# Patient Record
Sex: Female | Born: 1970
Health system: Southern US, Community
[De-identification: ages and names within clinical notes are randomized; demographics above are authoritative.]

## PROBLEM LIST (undated history)

## (undated) DIAGNOSIS — I10 Essential (primary) hypertension: Secondary | ICD-10-CM

## (undated) DIAGNOSIS — K219 Gastro-esophageal reflux disease without esophagitis: Secondary | ICD-10-CM

## (undated) DIAGNOSIS — N186 End stage renal disease: Secondary | ICD-10-CM

## (undated) DIAGNOSIS — R51 Headache: Secondary | ICD-10-CM

## (undated) DIAGNOSIS — F32A Depression, unspecified: Secondary | ICD-10-CM

## (undated) DIAGNOSIS — E7800 Pure hypercholesterolemia, unspecified: Secondary | ICD-10-CM

## (undated) DIAGNOSIS — I2510 Atherosclerotic heart disease of native coronary artery without angina pectoris: Secondary | ICD-10-CM

## (undated) DIAGNOSIS — I219 Acute myocardial infarction, unspecified: Secondary | ICD-10-CM

## (undated) DIAGNOSIS — K7581 Nonalcoholic steatohepatitis (NASH): Secondary | ICD-10-CM

## (undated) DIAGNOSIS — G35 Multiple sclerosis: Secondary | ICD-10-CM

## (undated) DIAGNOSIS — F329 Major depressive disorder, single episode, unspecified: Secondary | ICD-10-CM

## (undated) DIAGNOSIS — I739 Peripheral vascular disease, unspecified: Secondary | ICD-10-CM

## (undated) DIAGNOSIS — K429 Umbilical hernia without obstruction or gangrene: Secondary | ICD-10-CM

## (undated) DIAGNOSIS — Z992 Dependence on renal dialysis: Secondary | ICD-10-CM

## (undated) DIAGNOSIS — Z9109 Other allergy status, other than to drugs and biological substances: Secondary | ICD-10-CM

## (undated) DIAGNOSIS — E039 Hypothyroidism, unspecified: Secondary | ICD-10-CM

## (undated) DIAGNOSIS — C73 Malignant neoplasm of thyroid gland: Secondary | ICD-10-CM

## (undated) DIAGNOSIS — E109 Type 1 diabetes mellitus without complications: Secondary | ICD-10-CM

## (undated) DIAGNOSIS — Z9889 Other specified postprocedural states: Secondary | ICD-10-CM

## (undated) DIAGNOSIS — R112 Nausea with vomiting, unspecified: Secondary | ICD-10-CM

## (undated) HISTORY — PX: THYROIDECTOMY: SHX17

## (undated) HISTORY — PX: IRRIGATION AND DEBRIDEMENT ABSCESS: SHX5252

## (undated) HISTORY — PX: CARDIAC CATHETERIZATION: SHX172

## (undated) HISTORY — PX: CORONARY ANGIOPLASTY: SHX604

## (undated) HISTORY — PX: PARATHYROIDECTOMY: SHX19

## (undated) HISTORY — PX: CORONARY ANGIOPLASTY WITH STENT PLACEMENT: SHX49

## (undated) HISTORY — PX: LEG AMPUTATION BELOW KNEE: SHX694

---
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
## 1996-10-17 DIAGNOSIS — I219 Acute myocardial infarction, unspecified: Secondary | ICD-10-CM

## 1996-10-17 HISTORY — DX: Acute myocardial infarction, unspecified: I21.9

## 2004-10-17 HISTORY — PX: ABDOMINAL SURGERY: SHX537

## 2011-11-29 DIAGNOSIS — J449 Chronic obstructive pulmonary disease, unspecified: Secondary | ICD-10-CM | POA: Diagnosis not present

## 2011-11-29 DIAGNOSIS — I10 Essential (primary) hypertension: Secondary | ICD-10-CM | POA: Diagnosis not present

## 2011-12-26 DIAGNOSIS — I10 Essential (primary) hypertension: Secondary | ICD-10-CM | POA: Diagnosis not present

## 2011-12-26 DIAGNOSIS — E7800 Pure hypercholesterolemia, unspecified: Secondary | ICD-10-CM | POA: Diagnosis not present

## 2011-12-26 DIAGNOSIS — J209 Acute bronchitis, unspecified: Secondary | ICD-10-CM | POA: Diagnosis not present

## 2012-01-13 DIAGNOSIS — E119 Type 2 diabetes mellitus without complications: Secondary | ICD-10-CM | POA: Diagnosis not present

## 2012-01-13 DIAGNOSIS — I10 Essential (primary) hypertension: Secondary | ICD-10-CM | POA: Diagnosis not present

## 2012-01-13 DIAGNOSIS — E7800 Pure hypercholesterolemia, unspecified: Secondary | ICD-10-CM | POA: Diagnosis not present

## 2012-02-15 DIAGNOSIS — E119 Type 2 diabetes mellitus without complications: Secondary | ICD-10-CM | POA: Diagnosis not present

## 2012-02-15 DIAGNOSIS — M549 Dorsalgia, unspecified: Secondary | ICD-10-CM | POA: Diagnosis not present

## 2012-02-15 DIAGNOSIS — I10 Essential (primary) hypertension: Secondary | ICD-10-CM | POA: Diagnosis not present

## 2012-03-30 DIAGNOSIS — I739 Peripheral vascular disease, unspecified: Secondary | ICD-10-CM | POA: Diagnosis not present

## 2012-03-30 DIAGNOSIS — G8929 Other chronic pain: Secondary | ICD-10-CM | POA: Diagnosis not present

## 2012-03-30 DIAGNOSIS — I10 Essential (primary) hypertension: Secondary | ICD-10-CM | POA: Diagnosis not present

## 2012-03-30 DIAGNOSIS — F329 Major depressive disorder, single episode, unspecified: Secondary | ICD-10-CM | POA: Diagnosis not present

## 2012-03-30 DIAGNOSIS — H60399 Other infective otitis externa, unspecified ear: Secondary | ICD-10-CM | POA: Diagnosis not present

## 2012-03-30 DIAGNOSIS — E785 Hyperlipidemia, unspecified: Secondary | ICD-10-CM | POA: Diagnosis not present

## 2012-04-20 DIAGNOSIS — I10 Essential (primary) hypertension: Secondary | ICD-10-CM | POA: Diagnosis not present

## 2012-04-20 DIAGNOSIS — L89309 Pressure ulcer of unspecified buttock, unspecified stage: Secondary | ICD-10-CM | POA: Diagnosis not present

## 2012-04-20 DIAGNOSIS — E785 Hyperlipidemia, unspecified: Secondary | ICD-10-CM | POA: Diagnosis not present

## 2012-07-18 DIAGNOSIS — E119 Type 2 diabetes mellitus without complications: Secondary | ICD-10-CM | POA: Diagnosis not present

## 2012-07-18 DIAGNOSIS — J019 Acute sinusitis, unspecified: Secondary | ICD-10-CM | POA: Diagnosis not present

## 2012-07-18 DIAGNOSIS — G8929 Other chronic pain: Secondary | ICD-10-CM | POA: Diagnosis not present

## 2012-07-18 DIAGNOSIS — I10 Essential (primary) hypertension: Secondary | ICD-10-CM | POA: Diagnosis not present

## 2012-07-18 DIAGNOSIS — J209 Acute bronchitis, unspecified: Secondary | ICD-10-CM | POA: Diagnosis not present

## 2012-07-18 DIAGNOSIS — M79609 Pain in unspecified limb: Secondary | ICD-10-CM | POA: Diagnosis not present

## 2012-08-28 DIAGNOSIS — R51 Headache: Secondary | ICD-10-CM | POA: Diagnosis not present

## 2012-08-28 DIAGNOSIS — E119 Type 2 diabetes mellitus without complications: Secondary | ICD-10-CM | POA: Diagnosis not present

## 2012-08-28 DIAGNOSIS — S88119A Complete traumatic amputation at level between knee and ankle, unspecified lower leg, initial encounter: Secondary | ICD-10-CM | POA: Diagnosis not present

## 2012-08-28 DIAGNOSIS — Z9119 Patient's noncompliance with other medical treatment and regimen: Secondary | ICD-10-CM | POA: Diagnosis not present

## 2012-08-28 DIAGNOSIS — I10 Essential (primary) hypertension: Secondary | ICD-10-CM | POA: Diagnosis not present

## 2012-08-28 DIAGNOSIS — I731 Thromboangiitis obliterans [Buerger's disease]: Secondary | ICD-10-CM | POA: Diagnosis not present

## 2012-08-28 DIAGNOSIS — Z7982 Long term (current) use of aspirin: Secondary | ICD-10-CM | POA: Diagnosis not present

## 2012-08-28 DIAGNOSIS — I252 Old myocardial infarction: Secondary | ICD-10-CM | POA: Diagnosis not present

## 2012-08-28 DIAGNOSIS — Z9861 Coronary angioplasty status: Secondary | ICD-10-CM | POA: Diagnosis not present

## 2012-09-22 DIAGNOSIS — Z888 Allergy status to other drugs, medicaments and biological substances status: Secondary | ICD-10-CM | POA: Diagnosis not present

## 2012-09-22 DIAGNOSIS — I10 Essential (primary) hypertension: Secondary | ICD-10-CM | POA: Diagnosis not present

## 2012-10-05 DIAGNOSIS — F329 Major depressive disorder, single episode, unspecified: Secondary | ICD-10-CM | POA: Diagnosis not present

## 2012-10-05 DIAGNOSIS — Z9861 Coronary angioplasty status: Secondary | ICD-10-CM | POA: Diagnosis not present

## 2012-10-05 DIAGNOSIS — I2510 Atherosclerotic heart disease of native coronary artery without angina pectoris: Secondary | ICD-10-CM | POA: Diagnosis not present

## 2012-10-05 DIAGNOSIS — Z8679 Personal history of other diseases of the circulatory system: Secondary | ICD-10-CM | POA: Diagnosis not present

## 2012-10-05 DIAGNOSIS — R002 Palpitations: Secondary | ICD-10-CM | POA: Diagnosis not present

## 2012-10-05 DIAGNOSIS — I517 Cardiomegaly: Secondary | ICD-10-CM | POA: Diagnosis not present

## 2012-10-05 DIAGNOSIS — E119 Type 2 diabetes mellitus without complications: Secondary | ICD-10-CM | POA: Diagnosis not present

## 2012-10-05 DIAGNOSIS — I731 Thromboangiitis obliterans [Buerger's disease]: Secondary | ICD-10-CM | POA: Diagnosis not present

## 2012-10-05 DIAGNOSIS — J45909 Unspecified asthma, uncomplicated: Secondary | ICD-10-CM | POA: Diagnosis not present

## 2012-10-05 DIAGNOSIS — E785 Hyperlipidemia, unspecified: Secondary | ICD-10-CM | POA: Diagnosis not present

## 2012-10-05 DIAGNOSIS — N179 Acute kidney failure, unspecified: Secondary | ICD-10-CM | POA: Diagnosis not present

## 2012-10-05 DIAGNOSIS — S88119A Complete traumatic amputation at level between knee and ankle, unspecified lower leg, initial encounter: Secondary | ICD-10-CM | POA: Diagnosis not present

## 2012-10-05 DIAGNOSIS — N182 Chronic kidney disease, stage 2 (mild): Secondary | ICD-10-CM | POA: Diagnosis not present

## 2012-10-05 DIAGNOSIS — R079 Chest pain, unspecified: Secondary | ICD-10-CM | POA: Diagnosis not present

## 2012-10-05 DIAGNOSIS — I129 Hypertensive chronic kidney disease with stage 1 through stage 4 chronic kidney disease, or unspecified chronic kidney disease: Secondary | ICD-10-CM | POA: Diagnosis not present

## 2012-10-11 DIAGNOSIS — Z029 Encounter for administrative examinations, unspecified: Secondary | ICD-10-CM | POA: Diagnosis not present

## 2012-10-13 DIAGNOSIS — I739 Peripheral vascular disease, unspecified: Secondary | ICD-10-CM | POA: Diagnosis not present

## 2012-10-13 DIAGNOSIS — G459 Transient cerebral ischemic attack, unspecified: Secondary | ICD-10-CM | POA: Diagnosis not present

## 2012-10-13 DIAGNOSIS — F329 Major depressive disorder, single episode, unspecified: Secondary | ICD-10-CM | POA: Diagnosis present

## 2012-10-13 DIAGNOSIS — S88119A Complete traumatic amputation at level between knee and ankle, unspecified lower leg, initial encounter: Secondary | ICD-10-CM | POA: Diagnosis not present

## 2012-10-13 DIAGNOSIS — I674 Hypertensive encephalopathy: Secondary | ICD-10-CM | POA: Diagnosis present

## 2012-10-13 DIAGNOSIS — R51 Headache: Secondary | ICD-10-CM | POA: Diagnosis not present

## 2012-10-13 DIAGNOSIS — N179 Acute kidney failure, unspecified: Secondary | ICD-10-CM | POA: Diagnosis not present

## 2012-10-13 DIAGNOSIS — R069 Unspecified abnormalities of breathing: Secondary | ICD-10-CM | POA: Diagnosis not present

## 2012-10-13 DIAGNOSIS — I129 Hypertensive chronic kidney disease with stage 1 through stage 4 chronic kidney disease, or unspecified chronic kidney disease: Secondary | ICD-10-CM | POA: Diagnosis not present

## 2012-10-13 DIAGNOSIS — Z9861 Coronary angioplasty status: Secondary | ICD-10-CM | POA: Diagnosis not present

## 2012-10-13 DIAGNOSIS — E119 Type 2 diabetes mellitus without complications: Secondary | ICD-10-CM | POA: Diagnosis present

## 2012-10-13 DIAGNOSIS — I319 Disease of pericardium, unspecified: Secondary | ICD-10-CM | POA: Diagnosis not present

## 2012-10-13 DIAGNOSIS — I10 Essential (primary) hypertension: Secondary | ICD-10-CM | POA: Diagnosis not present

## 2012-10-13 DIAGNOSIS — N184 Chronic kidney disease, stage 4 (severe): Secondary | ICD-10-CM | POA: Diagnosis present

## 2012-10-13 DIAGNOSIS — H538 Other visual disturbances: Secondary | ICD-10-CM | POA: Diagnosis not present

## 2012-10-13 DIAGNOSIS — E7800 Pure hypercholesterolemia, unspecified: Secondary | ICD-10-CM | POA: Diagnosis not present

## 2012-10-13 DIAGNOSIS — I517 Cardiomegaly: Secondary | ICD-10-CM | POA: Diagnosis not present

## 2012-10-13 DIAGNOSIS — R011 Cardiac murmur, unspecified: Secondary | ICD-10-CM | POA: Diagnosis not present

## 2012-10-13 DIAGNOSIS — I2510 Atherosclerotic heart disease of native coronary artery without angina pectoris: Secondary | ICD-10-CM | POA: Diagnosis not present

## 2012-10-13 DIAGNOSIS — I252 Old myocardial infarction: Secondary | ICD-10-CM | POA: Diagnosis not present

## 2012-10-13 DIAGNOSIS — Z888 Allergy status to other drugs, medicaments and biological substances status: Secondary | ICD-10-CM | POA: Diagnosis not present

## 2012-10-13 DIAGNOSIS — Z9119 Patient's noncompliance with other medical treatment and regimen: Secondary | ICD-10-CM | POA: Diagnosis not present

## 2012-10-13 DIAGNOSIS — N189 Chronic kidney disease, unspecified: Secondary | ICD-10-CM | POA: Diagnosis not present

## 2012-10-30 DIAGNOSIS — R079 Chest pain, unspecified: Secondary | ICD-10-CM | POA: Diagnosis not present

## 2012-11-23 DIAGNOSIS — E7800 Pure hypercholesterolemia, unspecified: Secondary | ICD-10-CM | POA: Diagnosis not present

## 2012-11-23 DIAGNOSIS — I10 Essential (primary) hypertension: Secondary | ICD-10-CM | POA: Diagnosis not present

## 2013-01-28 DIAGNOSIS — S7000XA Contusion of unspecified hip, initial encounter: Secondary | ICD-10-CM | POA: Diagnosis not present

## 2013-01-28 DIAGNOSIS — M25559 Pain in unspecified hip: Secondary | ICD-10-CM | POA: Diagnosis not present

## 2013-02-25 DIAGNOSIS — S88119A Complete traumatic amputation at level between knee and ankle, unspecified lower leg, initial encounter: Secondary | ICD-10-CM | POA: Diagnosis not present

## 2013-02-25 DIAGNOSIS — H5319 Other subjective visual disturbances: Secondary | ICD-10-CM | POA: Diagnosis not present

## 2013-02-25 DIAGNOSIS — E119 Type 2 diabetes mellitus without complications: Secondary | ICD-10-CM | POA: Diagnosis not present

## 2013-02-25 DIAGNOSIS — I252 Old myocardial infarction: Secondary | ICD-10-CM | POA: Diagnosis not present

## 2013-02-25 DIAGNOSIS — Z79899 Other long term (current) drug therapy: Secondary | ICD-10-CM | POA: Diagnosis not present

## 2013-02-25 DIAGNOSIS — Z9861 Coronary angioplasty status: Secondary | ICD-10-CM | POA: Diagnosis not present

## 2013-02-25 DIAGNOSIS — I10 Essential (primary) hypertension: Secondary | ICD-10-CM | POA: Diagnosis not present

## 2013-02-25 DIAGNOSIS — I739 Peripheral vascular disease, unspecified: Secondary | ICD-10-CM | POA: Diagnosis not present

## 2013-02-25 DIAGNOSIS — I129 Hypertensive chronic kidney disease with stage 1 through stage 4 chronic kidney disease, or unspecified chronic kidney disease: Secondary | ICD-10-CM | POA: Diagnosis not present

## 2013-02-25 DIAGNOSIS — E875 Hyperkalemia: Secondary | ICD-10-CM | POA: Diagnosis not present

## 2013-02-25 DIAGNOSIS — E876 Hypokalemia: Secondary | ICD-10-CM | POA: Diagnosis not present

## 2013-02-25 DIAGNOSIS — N189 Chronic kidney disease, unspecified: Secondary | ICD-10-CM | POA: Diagnosis not present

## 2013-02-25 DIAGNOSIS — Z794 Long term (current) use of insulin: Secondary | ICD-10-CM | POA: Diagnosis not present

## 2013-03-06 DIAGNOSIS — E119 Type 2 diabetes mellitus without complications: Secondary | ICD-10-CM | POA: Diagnosis not present

## 2013-03-06 DIAGNOSIS — E7800 Pure hypercholesterolemia, unspecified: Secondary | ICD-10-CM | POA: Diagnosis not present

## 2013-03-06 DIAGNOSIS — E039 Hypothyroidism, unspecified: Secondary | ICD-10-CM | POA: Diagnosis not present

## 2013-03-27 DIAGNOSIS — N189 Chronic kidney disease, unspecified: Secondary | ICD-10-CM | POA: Diagnosis not present

## 2013-03-27 DIAGNOSIS — E119 Type 2 diabetes mellitus without complications: Secondary | ICD-10-CM | POA: Diagnosis not present

## 2013-03-27 DIAGNOSIS — E7800 Pure hypercholesterolemia, unspecified: Secondary | ICD-10-CM | POA: Diagnosis not present

## 2013-03-27 DIAGNOSIS — I129 Hypertensive chronic kidney disease with stage 1 through stage 4 chronic kidney disease, or unspecified chronic kidney disease: Secondary | ICD-10-CM | POA: Diagnosis not present

## 2013-04-11 DIAGNOSIS — H2510 Age-related nuclear cataract, unspecified eye: Secondary | ICD-10-CM | POA: Diagnosis not present

## 2013-04-11 DIAGNOSIS — E119 Type 2 diabetes mellitus without complications: Secondary | ICD-10-CM | POA: Diagnosis not present

## 2013-04-11 DIAGNOSIS — H04129 Dry eye syndrome of unspecified lacrimal gland: Secondary | ICD-10-CM | POA: Diagnosis not present

## 2013-05-01 DIAGNOSIS — I10 Essential (primary) hypertension: Secondary | ICD-10-CM | POA: Diagnosis not present

## 2013-05-01 DIAGNOSIS — J410 Simple chronic bronchitis: Secondary | ICD-10-CM | POA: Diagnosis not present

## 2013-05-01 DIAGNOSIS — E119 Type 2 diabetes mellitus without complications: Secondary | ICD-10-CM | POA: Diagnosis not present

## 2013-06-03 DIAGNOSIS — Z79899 Other long term (current) drug therapy: Secondary | ICD-10-CM | POA: Diagnosis not present

## 2013-06-03 DIAGNOSIS — I739 Peripheral vascular disease, unspecified: Secondary | ICD-10-CM | POA: Diagnosis not present

## 2013-06-03 DIAGNOSIS — S7000XA Contusion of unspecified hip, initial encounter: Secondary | ICD-10-CM | POA: Diagnosis not present

## 2013-06-03 DIAGNOSIS — S79929A Unspecified injury of unspecified thigh, initial encounter: Secondary | ICD-10-CM | POA: Diagnosis not present

## 2013-06-03 DIAGNOSIS — M25559 Pain in unspecified hip: Secondary | ICD-10-CM | POA: Diagnosis not present

## 2013-06-03 DIAGNOSIS — I252 Old myocardial infarction: Secondary | ICD-10-CM | POA: Diagnosis not present

## 2013-06-03 DIAGNOSIS — N189 Chronic kidney disease, unspecified: Secondary | ICD-10-CM | POA: Diagnosis not present

## 2013-06-03 DIAGNOSIS — E119 Type 2 diabetes mellitus without complications: Secondary | ICD-10-CM | POA: Diagnosis not present

## 2013-06-03 DIAGNOSIS — Z7982 Long term (current) use of aspirin: Secondary | ICD-10-CM | POA: Diagnosis not present

## 2013-06-03 DIAGNOSIS — S79919A Unspecified injury of unspecified hip, initial encounter: Secondary | ICD-10-CM | POA: Diagnosis not present

## 2013-06-03 DIAGNOSIS — S88119A Complete traumatic amputation at level between knee and ankle, unspecified lower leg, initial encounter: Secondary | ICD-10-CM | POA: Diagnosis not present

## 2013-06-03 DIAGNOSIS — N19 Unspecified kidney failure: Secondary | ICD-10-CM | POA: Diagnosis not present

## 2013-06-03 DIAGNOSIS — F17200 Nicotine dependence, unspecified, uncomplicated: Secondary | ICD-10-CM | POA: Diagnosis not present

## 2013-06-03 DIAGNOSIS — I129 Hypertensive chronic kidney disease with stage 1 through stage 4 chronic kidney disease, or unspecified chronic kidney disease: Secondary | ICD-10-CM | POA: Diagnosis not present

## 2013-06-04 DIAGNOSIS — R42 Dizziness and giddiness: Secondary | ICD-10-CM | POA: Diagnosis not present

## 2013-06-04 DIAGNOSIS — S04019A Injury of optic nerve, unspecified eye, initial encounter: Secondary | ICD-10-CM | POA: Diagnosis not present

## 2013-06-04 DIAGNOSIS — R259 Unspecified abnormal involuntary movements: Secondary | ICD-10-CM | POA: Diagnosis not present

## 2013-06-04 DIAGNOSIS — R51 Headache: Secondary | ICD-10-CM | POA: Diagnosis not present

## 2013-06-05 ENCOUNTER — Other Ambulatory Visit: Payer: Self-pay | Admitting: Neurology

## 2013-06-05 DIAGNOSIS — R51 Headache: Secondary | ICD-10-CM

## 2013-06-05 DIAGNOSIS — S04019A Injury of optic nerve, unspecified eye, initial encounter: Secondary | ICD-10-CM

## 2013-06-05 DIAGNOSIS — E119 Type 2 diabetes mellitus without complications: Secondary | ICD-10-CM | POA: Diagnosis not present

## 2013-06-05 DIAGNOSIS — M25559 Pain in unspecified hip: Secondary | ICD-10-CM | POA: Diagnosis not present

## 2013-06-05 DIAGNOSIS — I10 Essential (primary) hypertension: Secondary | ICD-10-CM | POA: Diagnosis not present

## 2013-06-10 ENCOUNTER — Ambulatory Visit (HOSPITAL_COMMUNITY)
Admission: RE | Admit: 2013-06-10 | Discharge: 2013-06-10 | Disposition: A | Discharge status: Home / Self Care | Payer: Medicare Other | Source: Ambulatory Visit | Attending: Neurology | Admitting: Neurology

## 2013-06-10 DIAGNOSIS — R42 Dizziness and giddiness: Secondary | ICD-10-CM | POA: Diagnosis not present

## 2013-06-10 DIAGNOSIS — G9389 Other specified disorders of brain: Secondary | ICD-10-CM | POA: Insufficient documentation

## 2013-06-10 DIAGNOSIS — M542 Cervicalgia: Secondary | ICD-10-CM | POA: Diagnosis not present

## 2013-06-10 DIAGNOSIS — S04039A Injury of optic tract and pathways, unspecified eye, initial encounter: Secondary | ICD-10-CM | POA: Diagnosis not present

## 2013-06-10 DIAGNOSIS — M810 Age-related osteoporosis without current pathological fracture: Secondary | ICD-10-CM | POA: Diagnosis not present

## 2013-06-10 DIAGNOSIS — Z79899 Other long term (current) drug therapy: Secondary | ICD-10-CM | POA: Diagnosis not present

## 2013-06-10 DIAGNOSIS — R51 Headache: Secondary | ICD-10-CM

## 2013-06-10 DIAGNOSIS — I10 Essential (primary) hypertension: Secondary | ICD-10-CM | POA: Diagnosis not present

## 2013-06-10 DIAGNOSIS — S04019A Injury of optic nerve, unspecified eye, initial encounter: Secondary | ICD-10-CM

## 2013-06-10 DIAGNOSIS — T07XXXA Unspecified multiple injuries, initial encounter: Secondary | ICD-10-CM | POA: Diagnosis not present

## 2013-06-10 DIAGNOSIS — M47812 Spondylosis without myelopathy or radiculopathy, cervical region: Secondary | ICD-10-CM | POA: Diagnosis not present

## 2013-06-10 DIAGNOSIS — R930 Abnormal findings on diagnostic imaging of skull and head, not elsewhere classified: Secondary | ICD-10-CM | POA: Diagnosis not present

## 2013-06-10 DIAGNOSIS — X58XXXA Exposure to other specified factors, initial encounter: Secondary | ICD-10-CM | POA: Insufficient documentation

## 2013-06-10 DIAGNOSIS — E109 Type 1 diabetes mellitus without complications: Secondary | ICD-10-CM | POA: Diagnosis not present

## 2013-06-10 DIAGNOSIS — R259 Unspecified abnormal involuntary movements: Secondary | ICD-10-CM | POA: Diagnosis not present

## 2013-06-10 DIAGNOSIS — R5381 Other malaise: Secondary | ICD-10-CM | POA: Diagnosis not present

## 2013-06-10 DIAGNOSIS — G35 Multiple sclerosis: Secondary | ICD-10-CM | POA: Diagnosis not present

## 2013-06-10 DIAGNOSIS — E538 Deficiency of other specified B group vitamins: Secondary | ICD-10-CM | POA: Diagnosis not present

## 2013-06-20 DIAGNOSIS — S32509A Unspecified fracture of unspecified pubis, initial encounter for closed fracture: Secondary | ICD-10-CM | POA: Diagnosis not present

## 2013-07-09 DIAGNOSIS — N184 Chronic kidney disease, stage 4 (severe): Secondary | ICD-10-CM | POA: Diagnosis not present

## 2013-07-09 DIAGNOSIS — F17200 Nicotine dependence, unspecified, uncomplicated: Secondary | ICD-10-CM | POA: Diagnosis not present

## 2013-07-09 DIAGNOSIS — E1129 Type 2 diabetes mellitus with other diabetic kidney complication: Secondary | ICD-10-CM | POA: Diagnosis not present

## 2013-07-09 DIAGNOSIS — I739 Peripheral vascular disease, unspecified: Secondary | ICD-10-CM | POA: Diagnosis not present

## 2013-07-09 DIAGNOSIS — I10 Essential (primary) hypertension: Secondary | ICD-10-CM | POA: Diagnosis not present

## 2013-07-11 DIAGNOSIS — R569 Unspecified convulsions: Secondary | ICD-10-CM | POA: Diagnosis not present

## 2013-07-15 ENCOUNTER — Other Ambulatory Visit: Payer: Self-pay | Admitting: Neurology

## 2013-07-15 DIAGNOSIS — G35 Multiple sclerosis: Secondary | ICD-10-CM

## 2013-07-15 DIAGNOSIS — R269 Unspecified abnormalities of gait and mobility: Secondary | ICD-10-CM | POA: Diagnosis not present

## 2013-07-15 DIAGNOSIS — R259 Unspecified abnormal involuntary movements: Secondary | ICD-10-CM | POA: Diagnosis not present

## 2013-07-15 DIAGNOSIS — H547 Unspecified visual loss: Secondary | ICD-10-CM | POA: Diagnosis not present

## 2013-07-15 DIAGNOSIS — G894 Chronic pain syndrome: Secondary | ICD-10-CM | POA: Diagnosis not present

## 2013-07-18 ENCOUNTER — Encounter (HOSPITAL_COMMUNITY): Payer: Medicare Other

## 2013-07-18 ENCOUNTER — Other Ambulatory Visit (HOSPITAL_COMMUNITY): Payer: Medicare Other

## 2013-07-25 ENCOUNTER — Inpatient Hospital Stay (HOSPITAL_COMMUNITY): Admission: RE | Admit: 2013-07-25 | Payer: Medicare Other | Source: Ambulatory Visit

## 2013-07-29 ENCOUNTER — Emergency Department (HOSPITAL_COMMUNITY): Payer: Medicare Other

## 2013-07-29 ENCOUNTER — Inpatient Hospital Stay (HOSPITAL_COMMUNITY)
Admission: EM | Admit: 2013-07-29 | Discharge: 2013-08-03 | DRG: 442 | Disposition: A | Discharge status: Home / Self Care | Payer: Medicare Other | Attending: Internal Medicine | Admitting: Internal Medicine

## 2013-07-29 ENCOUNTER — Encounter (HOSPITAL_COMMUNITY): Payer: Self-pay | Admitting: Emergency Medicine

## 2013-07-29 DIAGNOSIS — E119 Type 2 diabetes mellitus without complications: Secondary | ICD-10-CM

## 2013-07-29 DIAGNOSIS — K766 Portal hypertension: Secondary | ICD-10-CM | POA: Diagnosis present

## 2013-07-29 DIAGNOSIS — N289 Disorder of kidney and ureter, unspecified: Secondary | ICD-10-CM | POA: Diagnosis present

## 2013-07-29 DIAGNOSIS — I739 Peripheral vascular disease, unspecified: Secondary | ICD-10-CM | POA: Diagnosis present

## 2013-07-29 DIAGNOSIS — E872 Acidosis, unspecified: Secondary | ICD-10-CM | POA: Diagnosis not present

## 2013-07-29 DIAGNOSIS — E1129 Type 2 diabetes mellitus with other diabetic kidney complication: Secondary | ICD-10-CM | POA: Diagnosis present

## 2013-07-29 DIAGNOSIS — R971 Elevated cancer antigen 125 [CA 125]: Secondary | ICD-10-CM | POA: Diagnosis not present

## 2013-07-29 DIAGNOSIS — K8020 Calculus of gallbladder without cholecystitis without obstruction: Secondary | ICD-10-CM | POA: Diagnosis not present

## 2013-07-29 DIAGNOSIS — R109 Unspecified abdominal pain: Secondary | ICD-10-CM

## 2013-07-29 DIAGNOSIS — Z9861 Coronary angioplasty status: Secondary | ICD-10-CM

## 2013-07-29 DIAGNOSIS — K7460 Unspecified cirrhosis of liver: Secondary | ICD-10-CM | POA: Diagnosis not present

## 2013-07-29 DIAGNOSIS — N189 Chronic kidney disease, unspecified: Secondary | ICD-10-CM | POA: Diagnosis present

## 2013-07-29 DIAGNOSIS — I252 Old myocardial infarction: Secondary | ICD-10-CM

## 2013-07-29 DIAGNOSIS — I2510 Atherosclerotic heart disease of native coronary artery without angina pectoris: Secondary | ICD-10-CM | POA: Diagnosis present

## 2013-07-29 DIAGNOSIS — R161 Splenomegaly, not elsewhere classified: Secondary | ICD-10-CM | POA: Diagnosis present

## 2013-07-29 DIAGNOSIS — S88119A Complete traumatic amputation at level between knee and ankle, unspecified lower leg, initial encounter: Secondary | ICD-10-CM | POA: Diagnosis not present

## 2013-07-29 DIAGNOSIS — R188 Other ascites: Secondary | ICD-10-CM | POA: Diagnosis present

## 2013-07-29 DIAGNOSIS — K709 Alcoholic liver disease, unspecified: Secondary | ICD-10-CM | POA: Diagnosis not present

## 2013-07-29 DIAGNOSIS — N184 Chronic kidney disease, stage 4 (severe): Secondary | ICD-10-CM | POA: Diagnosis present

## 2013-07-29 DIAGNOSIS — IMO0001 Reserved for inherently not codable concepts without codable children: Secondary | ICD-10-CM | POA: Diagnosis present

## 2013-07-29 DIAGNOSIS — I129 Hypertensive chronic kidney disease with stage 1 through stage 4 chronic kidney disease, or unspecified chronic kidney disease: Secondary | ICD-10-CM | POA: Diagnosis present

## 2013-07-29 DIAGNOSIS — Z794 Long term (current) use of insulin: Secondary | ICD-10-CM

## 2013-07-29 DIAGNOSIS — I10 Essential (primary) hypertension: Secondary | ICD-10-CM | POA: Diagnosis present

## 2013-07-29 DIAGNOSIS — E875 Hyperkalemia: Secondary | ICD-10-CM | POA: Diagnosis present

## 2013-07-29 DIAGNOSIS — R1033 Periumbilical pain: Secondary | ICD-10-CM | POA: Diagnosis not present

## 2013-07-29 DIAGNOSIS — R809 Proteinuria, unspecified: Secondary | ICD-10-CM | POA: Diagnosis not present

## 2013-07-29 DIAGNOSIS — K7689 Other specified diseases of liver: Principal | ICD-10-CM | POA: Diagnosis present

## 2013-07-29 DIAGNOSIS — N19 Unspecified kidney failure: Secondary | ICD-10-CM | POA: Diagnosis not present

## 2013-07-29 DIAGNOSIS — N058 Unspecified nephritic syndrome with other morphologic changes: Secondary | ICD-10-CM | POA: Diagnosis present

## 2013-07-29 DIAGNOSIS — E8779 Other fluid overload: Secondary | ICD-10-CM | POA: Diagnosis not present

## 2013-07-29 DIAGNOSIS — F17200 Nicotine dependence, unspecified, uncomplicated: Secondary | ICD-10-CM | POA: Diagnosis present

## 2013-07-29 DIAGNOSIS — N2581 Secondary hyperparathyroidism of renal origin: Secondary | ICD-10-CM | POA: Diagnosis not present

## 2013-07-29 DIAGNOSIS — Z79899 Other long term (current) drug therapy: Secondary | ICD-10-CM

## 2013-07-29 DIAGNOSIS — G379 Demyelinating disease of central nervous system, unspecified: Secondary | ICD-10-CM | POA: Diagnosis present

## 2013-07-29 DIAGNOSIS — H538 Other visual disturbances: Secondary | ICD-10-CM | POA: Diagnosis present

## 2013-07-29 DIAGNOSIS — K429 Umbilical hernia without obstruction or gangrene: Secondary | ICD-10-CM | POA: Diagnosis present

## 2013-07-29 DIAGNOSIS — R899 Unspecified abnormal finding in specimens from other organs, systems and tissues: Secondary | ICD-10-CM | POA: Diagnosis not present

## 2013-07-29 HISTORY — DX: Atherosclerotic heart disease of native coronary artery without angina pectoris: I25.10

## 2013-07-29 HISTORY — DX: Essential (primary) hypertension: I10

## 2013-07-29 LAB — COMPREHENSIVE METABOLIC PANEL
ALT: 22 U/L (ref 0–35)
AST: 24 U/L (ref 0–37)
CO2: 19 mEq/L (ref 19–32)
Chloride: 107 mEq/L (ref 96–112)
Creatinine, Ser: 3.1 mg/dL — ABNORMAL HIGH (ref 0.50–1.10)
GFR calc Af Amer: 20 mL/min — ABNORMAL LOW (ref >90)
GFR calc non Af Amer: 17 mL/min — ABNORMAL LOW (ref >90)
Glucose, Bld: 264 mg/dL — ABNORMAL HIGH (ref 70–99)
Sodium: 138 mEq/L (ref 135–145)
Total Bilirubin: 0.2 mg/dL — ABNORMAL LOW (ref 0.3–1.2)

## 2013-07-29 LAB — CBC WITH DIFFERENTIAL/PLATELET
Basophils Absolute: 0 10*3/uL (ref 0.0–0.1)
Eosinophils Relative: 1 % (ref 0–5)
HCT: 38.7 % (ref 36.0–46.0)
Lymphocytes Relative: 15 % (ref 12–46)
Lymphs Abs: 1.1 10*3/uL (ref 0.7–4.0)
MCHC: 33.1 g/dL (ref 30.0–36.0)
MCV: 82.3 fL (ref 78.0–100.0)
Monocytes Absolute: 0.5 10*3/uL (ref 0.1–1.0)
Neutro Abs: 5.6 10*3/uL (ref 1.7–7.7)
RBC: 4.7 MIL/uL (ref 3.87–5.11)
RDW: 14.3 % (ref 11.5–15.5)
WBC: 7.3 10*3/uL (ref 4.0–10.5)

## 2013-07-29 LAB — POCT PREGNANCY, URINE: Preg Test, Ur: NEGATIVE

## 2013-07-29 MED ORDER — IOHEXOL 300 MG/ML  SOLN
20.0000 mL | INTRAMUSCULAR | Status: AC
Start: 2013-07-29 — End: 2013-07-29
  Administered 2013-07-29: 25 mL via ORAL

## 2013-07-29 MED ORDER — HYDROMORPHONE HCL PF 1 MG/ML IJ SOLN
1.0000 mg | Freq: Once | INTRAMUSCULAR | Status: AC
  Administered 2013-07-29: 1 mg via INTRAVENOUS
  Filled 2013-07-29: qty 1

## 2013-07-29 NOTE — ED Notes (Signed)
Pt was told to come to the ED.  Pt has been having abdominal pain for a while and now has firm area above umbilicus and states she sees a cantaloupe shaped area distal to abdomen.  Pt is diabetic with BLE amputations. ? Hernia

## 2013-07-29 NOTE — ED Provider Notes (Signed)
CSN: [id number]     Arrival date & time 07/29/13  1810 History   First MD Initiated Contact with Patient 07/29/13 2100     Chief Complaint  Patient presents with  . Abdominal Pain   (Consider location/radiation/quality/duration/timing/severity/associated sxs/prior Treatment) HPI Complains of periumbilical abdominal pain, nonradiating for approximately 4 months, progressively worsening with abdomen becoming progressively more distended. Pain is worse with lying on her side improved with lying supine. No treatment prior to coming here. She was advised by her primary care physician to come to the emergency department today. Last normal menstrual period end of last month. Last bowel movement this morning, normal. Associated symptoms include early satiety for the past week. Denies fever. No treatment prior to coming here. Past Medical History  Diagnosis Date  . Diabetes mellitus without complication   . Coronary artery disease   . Hypertension    chronic renal failure, peripheral vascular disease, chronic back pain and pain management clinic Past Surgical History  Procedure Laterality Date  . Ble amputation bk    . Abdominal surgery    . Cardiac stents     No family history on file. History  Substance Use Topics  . Smoking status: Current Every Day Smoker  . Smokeless tobacco: Not on file  . Alcohol Use: No   OB History   Grav Para Term Preterm Abortions TAB SAB Ect Mult Living                 Review of Systems  Constitutional: Positive for appetite change.  Gastrointestinal: Positive for abdominal pain.  Musculoskeletal: Positive for back pain.  Allergic/Immunologic: Positive for immunocompromised state.  All other systems reviewed and are negative.    Allergies  Metformin and related  Home Medications  No current outpatient prescriptions on file. BP 187/84  Pulse 80  Temp(Src) 98.3 F (36.8 C) (Oral)  Resp 20  SpO2 99% Physical Exam  Nursing note and vitals  reviewed. Constitutional: She appears well-developed and well-nourished.  HENT:  Head: Normocephalic and atraumatic.  Eyes: Conjunctivae are normal. Pupils are equal, round, and reactive to light.  Neck: Neck supple. No tracheal deviation present. No thyromegaly present.  Cardiovascular: Normal rate and regular rhythm.   No murmur heard. Pulmonary/Chest: Effort normal and breath sounds normal.  Abdominal: Soft. Bowel sounds are normal. She exhibits distension. She exhibits no mass. There is tenderness. There is no rebound and no guarding.  Periumbilical tenderness.  Musculoskeletal: Normal range of motion. She exhibits no edema and no tenderness.  Bilateral BKA  Neurological: She is alert. Coordination normal.  Skin: Skin is warm and dry. No rash noted.  Psychiatric: She has a normal mood and affect.    ED Course  Procedures (including critical care time) Labs Review Labs Reviewed  COMPREHENSIVE METABOLIC PANEL - Abnormal; Notable for the following:    Potassium 6.2 (*)    Glucose, Bld 264 (*)    BUN 57 (*)    Creatinine, Ser 3.10 (*)    Calcium 8.0 (*)    Albumin 2.8 (*)    Total Bilirubin 0.2 (*)    GFR calc non Af Amer 17 (*)    GFR calc Af Amer 20 (*)    All other components within normal limits  CBC WITH DIFFERENTIAL  URINALYSIS, ROUTINE W REFLEX MICROSCOPIC   Imaging Review No results found.  Date: 07/30/2013  Rate: 70  Rhythm: normal sinus rhythm  QRS Axis: normal  Intervals: normal  ST/T Wave abnormalities: normal  Conduction  Disutrbances:nonspecific intraventricular conduction delay  Narrative Interpretation:   Old EKG Reviewed: none available Results for orders placed during the hospital encounter of 07/29/13  CBC WITH DIFFERENTIAL      Result Value Range   WBC 7.3  4.0 - 10.5 K/uL   RBC 4.70  3.87 - 5.11 MIL/uL   Hemoglobin 12.8  12.0 - 15.0 g/dL   HCT 38.7  36.0 - 46.0 %   MCV 82.3  78.0 - 100.0 fL   MCH 27.2  26.0 - 34.0 pg   MCHC 33.1  30.0 - 36.0  g/dL   RDW 14.3  11.5 - 15.5 %   Platelets 210  150 - 400 K/uL   Neutrophils Relative % 77  43 - 77 %   Neutro Abs 5.6  1.7 - 7.7 K/uL   Lymphocytes Relative 15  12 - 46 %   Lymphs Abs 1.1  0.7 - 4.0 K/uL   Monocytes Relative 6  3 - 12 %   Monocytes Absolute 0.5  0.1 - 1.0 K/uL   Eosinophils Relative 1  0 - 5 %   Eosinophils Absolute 0.1  0.0 - 0.7 K/uL   Basophils Relative 0  0 - 1 %   Basophils Absolute 0.0  0.0 - 0.1 K/uL  COMPREHENSIVE METABOLIC PANEL      Result Value Range   Sodium 138  135 - 145 mEq/L   Potassium 6.2 (*) 3.5 - 5.1 mEq/L   Chloride 107  96 - 112 mEq/L   CO2 19  19 - 32 mEq/L   Glucose, Bld 264 (*) 70 - 99 mg/dL   BUN 57 (*) 6 - 23 mg/dL   Creatinine, Ser 3.10 (*) 0.50 - 1.10 mg/dL   Calcium 8.0 (*) 8.4 - 10.5 mg/dL   Total Protein 6.3  6.0 - 8.3 g/dL   Albumin 2.8 (*) 3.5 - 5.2 g/dL   AST 24  0 - 37 U/L   ALT 22  0 - 35 U/L   Alkaline Phosphatase 116  39 - 117 U/L   Total Bilirubin 0.2 (*) 0.3 - 1.2 mg/dL   GFR calc non Af Amer 17 (*) >90 mL/min   GFR calc Af Amer 20 (*) >90 mL/min  URINALYSIS, ROUTINE W REFLEX MICROSCOPIC      Result Value Range   Color, Urine YELLOW  YELLOW   APPearance CLOUDY (*) CLEAR   Specific Gravity, Urine 1.011  1.005 - 1.030   pH 5.5  5.0 - 8.0   Glucose, UA 250 (*) NEGATIVE mg/dL   Hgb urine dipstick TRACE (*) NEGATIVE   Bilirubin Urine NEGATIVE  NEGATIVE   Ketones, ur NEGATIVE  NEGATIVE mg/dL   Protein, ur 100 (*) NEGATIVE mg/dL   Urobilinogen, UA 0.2  0.0 - 1.0 mg/dL   Nitrite NEGATIVE  NEGATIVE   Leukocytes, UA TRACE (*) NEGATIVE  POTASSIUM      Result Value Range   Potassium 5.9 (*) 3.5 - 5.1 mEq/L  URINE MICROSCOPIC-ADD ON      Result Value Range   Squamous Epithelial / LPF FEW (*) RARE   WBC, UA 3-6  <3 WBC/hpf   RBC / HPF 3-6  <3 RBC/hpf   Bacteria, UA RARE  RARE  POCT PREGNANCY, URINE      Result Value Range   Preg Test, Ur NEGATIVE  NEGATIVE   Ct Abdomen Pelvis Wo Contrast  07/30/2013   CLINICAL  DATA:  Abdominal pain.  EXAM: CT ABDOMEN AND PELVIS WITHOUT CONTRAST  TECHNIQUE: Multidetector CT imaging of the abdomen and pelvis was performed following the standard protocol without intravenous contrast.  COMPARISON:  None.  FINDINGS: Unenhanced CT was performed per clinician order. Lack of IV contrast limits sensitivity and specificity, especially for evaluation of abdominal/pelvic solid viscera.  No infiltrates. Cardiomegaly. Marked ascites. Anasarca. Large hepatic granuloma. Splenomegaly. Layering gallstones with no definite wall thickening. Non aneurysmal aorta and iliac vascular calcifications. No bowel obstruction. No renal obstruction. No worrisome osseous lesions. Small umbilical hernia containing fat. No pelvic masses.  IMPRESSION: Marked ascites. Splenomegaly. Gallstones. Anasarca. Question cirrhosis or hepatitis. No pleural effusions to suggest cardiac failure. Elective paracentesis could be helpful in further evaluation.   Electronically Signed   By: Rolla Flatten M.D.   On: 07/30/2013 00:36    EKG Interpretation   None      12:55 AM pain was transiently improved after treatment with intravenous hydromorphone. Requesting more pain medicine. Additional hydromorphone ordered by me. MDM  No diagnosis found.  Case discussed with Dr.Kakrakandy plan admit to telemetry, Kayexalate. Patient will require repeat serum potassium. May benefit from paracentesis, diagnostic and therapeutic. Diagnosis #1 abdominal pain #2 ascites #3 hyperkalemia #4 renal failure #5 hyperglycemia    Orlie Dakin, MD 07/30/13 [phone]

## 2013-07-29 NOTE — ED Notes (Signed)
Pt aware urine specimen is needed. 

## 2013-07-30 ENCOUNTER — Inpatient Hospital Stay (HOSPITAL_COMMUNITY): Payer: Medicare Other

## 2013-07-30 ENCOUNTER — Encounter (HOSPITAL_COMMUNITY): Payer: Self-pay | Admitting: Internal Medicine

## 2013-07-30 DIAGNOSIS — R188 Other ascites: Secondary | ICD-10-CM

## 2013-07-30 DIAGNOSIS — R109 Unspecified abdominal pain: Secondary | ICD-10-CM

## 2013-07-30 DIAGNOSIS — I2510 Atherosclerotic heart disease of native coronary artery without angina pectoris: Secondary | ICD-10-CM

## 2013-07-30 DIAGNOSIS — E119 Type 2 diabetes mellitus without complications: Secondary | ICD-10-CM

## 2013-07-30 DIAGNOSIS — IMO0001 Reserved for inherently not codable concepts without codable children: Secondary | ICD-10-CM | POA: Diagnosis present

## 2013-07-30 DIAGNOSIS — I10 Essential (primary) hypertension: Secondary | ICD-10-CM

## 2013-07-30 DIAGNOSIS — N19 Unspecified kidney failure: Secondary | ICD-10-CM

## 2013-07-30 LAB — URINALYSIS, ROUTINE W REFLEX MICROSCOPIC
Bilirubin Urine: NEGATIVE
Glucose, UA: 250 mg/dL — AB
Nitrite: NEGATIVE
Protein, ur: 100 mg/dL — AB
Urobilinogen, UA: 0.2 mg/dL (ref 0.0–1.0)

## 2013-07-30 LAB — CBC WITH DIFFERENTIAL/PLATELET
Basophils Absolute: 0.1 10*3/uL (ref 0.0–0.1)
Basophils Relative: 1 % (ref 0–1)
Eosinophils Absolute: 0.1 10*3/uL (ref 0.0–0.7)
Eosinophils Relative: 1 % (ref 0–5)
Lymphocytes Relative: 20 % (ref 12–46)
Lymphs Abs: 1.3 10*3/uL (ref 0.7–4.0)
MCH: 27 pg (ref 26.0–34.0)
MCV: 81.7 fL (ref 78.0–100.0)
Monocytes Absolute: 0.5 10*3/uL (ref 0.1–1.0)
Neutrophils Relative %: 70 % (ref 43–77)
Platelets: 220 10*3/uL (ref 150–400)
RDW: 14.3 % (ref 11.5–15.5)
WBC: 6.6 10*3/uL (ref 4.0–10.5)

## 2013-07-30 LAB — POCT I-STAT 3, ART BLOOD GAS (G3+)
Acid-base deficit: 7 mmol/L — ABNORMAL HIGH (ref 0.0–2.0)
Bicarbonate: 18.1 meq/L — ABNORMAL LOW (ref 20.0–24.0)
O2 Saturation: 94 %
Patient temperature: 98.6
TCO2: 19 mmol/L (ref 0–100)
pCO2 arterial: 35.4 mmHg (ref 35.0–45.0)
pH, Arterial: 7.317 — ABNORMAL LOW (ref 7.350–7.450)
pO2, Arterial: 78 mmHg — ABNORMAL LOW (ref 80.0–100.0)

## 2013-07-30 LAB — PROTEIN / CREATININE RATIO, URINE
Creatinine, Urine: 13.07 mg/dL
Total Protein, Urine: 36 mg/dL

## 2013-07-30 LAB — HEPATITIS PANEL, ACUTE
Hep A IgM: NONREACTIVE
Hep B C IgM: NONREACTIVE

## 2013-07-30 LAB — BASIC METABOLIC PANEL
CO2: 19 mEq/L (ref 19–32)
Calcium: 8 mg/dL — ABNORMAL LOW (ref 8.4–10.5)
Creatinine, Ser: 3.06 mg/dL — ABNORMAL HIGH (ref 0.50–1.10)
Glucose, Bld: 107 mg/dL — ABNORMAL HIGH (ref 70–99)

## 2013-07-30 LAB — AMYLASE, BODY FLUID

## 2013-07-30 LAB — PHOSPHORUS: Phosphorus: 5.7 mg/dL — ABNORMAL HIGH (ref 2.3–4.6)

## 2013-07-30 LAB — BODY FLUID CELL COUNT WITH DIFFERENTIAL
Eos, Fluid: 0 %
Monocyte-Macrophage-Serous Fluid: 84 % (ref 50–90)
Neutrophil Count, Fluid: 4 % (ref 0–25)
Total Nucleated Cell Count, Fluid: 125 cu mm (ref 0–1000)

## 2013-07-30 LAB — LIPASE, BLOOD: Lipase: 53 U/L (ref 11–59)

## 2013-07-30 LAB — PROTIME-INR: Prothrombin Time: 13.1 seconds (ref 11.6–15.2)

## 2013-07-30 LAB — URINE MICROSCOPIC-ADD ON

## 2013-07-30 LAB — GLUCOSE, CAPILLARY: Glucose-Capillary: 181 mg/dL — ABNORMAL HIGH (ref 70–99)

## 2013-07-30 LAB — PROTEIN, BODY FLUID: Total protein, fluid: 3.2 g/dL

## 2013-07-30 LAB — CREATININE, URINE, RANDOM: Creatinine, Urine: 40.32 mg/dL

## 2013-07-30 LAB — SALICYLATE LEVEL: Salicylate Lvl: <2 mg/dL — ABNORMAL LOW (ref 2.8–20.0)

## 2013-07-30 LAB — LACTATE DEHYDROGENASE, PLEURAL OR PERITONEAL FLUID: LD, Fluid: 43 U/L — ABNORMAL HIGH (ref 3–23)

## 2013-07-30 LAB — TSH: TSH: 4.544 u[IU]/mL — ABNORMAL HIGH (ref 0.350–4.500)

## 2013-07-30 LAB — POTASSIUM: Potassium: 5.8 mEq/L — ABNORMAL HIGH (ref 3.5–5.1)

## 2013-07-30 LAB — HIV ANTIBODY (ROUTINE TESTING W REFLEX): HIV: NONREACTIVE

## 2013-07-30 MED ORDER — HYDRALAZINE HCL 25 MG PO TABS
25.0000 mg | ORAL_TABLET | Freq: Three times a day (TID) | ORAL | Status: DC
  Administered 2013-07-30 – 2013-08-03 (×13): 25 mg via ORAL
  Filled 2013-07-30 (×16): qty 1

## 2013-07-30 MED ORDER — ONDANSETRON HCL 4 MG PO TABS: 4.0000 mg | ORAL_TABLET | Freq: Four times a day (QID) | ORAL | Status: DC | PRN

## 2013-07-30 MED ORDER — SODIUM CHLORIDE 0.9 % IJ SOLN
3.0000 mL | Freq: Two times a day (BID) | INTRAMUSCULAR | Status: DC
  Administered 2013-07-31: 3 mL via INTRAVENOUS

## 2013-07-30 MED ORDER — ACETAMINOPHEN 325 MG PO TABS: 650.0000 mg | ORAL_TABLET | Freq: Four times a day (QID) | ORAL | Status: DC | PRN

## 2013-07-30 MED ORDER — DEXTROSE 50 % IV SOLN
0.5000 | Freq: Once | INTRAVENOUS | Status: AC
  Administered 2013-07-30: 10:00:00 25 mL via INTRAVENOUS
  Filled 2013-07-30 (×2): qty 50

## 2013-07-30 MED ORDER — INSULIN ASPART 100 UNIT/ML ~~LOC~~ SOLN
0.0000 [IU] | Freq: Three times a day (TID) | SUBCUTANEOUS | Status: DC
  Administered 2013-07-30: 2 [IU] via SUBCUTANEOUS
  Administered 2013-07-31 – 2013-08-02 (×4): 1 [IU] via SUBCUTANEOUS

## 2013-07-30 MED ORDER — POLYVINYL ALCOHOL 1.4 % OP SOLN
2.0000 [drp] | OPHTHALMIC | Status: DC
  Administered 2013-07-30 – 2013-08-03 (×22): 2 [drp] via OPHTHALMIC
  Filled 2013-07-30: qty 15

## 2013-07-30 MED ORDER — HYDROMORPHONE HCL PF 1 MG/ML IJ SOLN
0.5000 mg | INTRAMUSCULAR | Status: DC | PRN
  Administered 2013-07-31: 0.5 mg via INTRAVENOUS
  Filled 2013-07-30: qty 1

## 2013-07-30 MED ORDER — INSULIN ASPART 100 UNIT/ML ~~LOC~~ SOLN
10.0000 [IU] | Freq: Once | SUBCUTANEOUS | Status: AC
  Administered 2013-07-30: 10:00:00 10 [IU] via INTRAVENOUS

## 2013-07-30 MED ORDER — SODIUM POLYSTYRENE SULFONATE 15 GM/60ML PO SUSP
30.0000 g | Freq: Once | ORAL | Status: AC
  Administered 2013-07-30: 30 g via ORAL
  Filled 2013-07-30: qty 120

## 2013-07-30 MED ORDER — ATORVASTATIN CALCIUM 80 MG PO TABS
80.0000 mg | ORAL_TABLET | Freq: Every day | ORAL | Status: DC
  Administered 2013-07-30 – 2013-08-02 (×4): 80 mg via ORAL
  Filled 2013-07-30 (×5): qty 1

## 2013-07-30 MED ORDER — FUROSEMIDE 10 MG/ML IJ SOLN
80.0000 mg | Freq: Two times a day (BID) | INTRAMUSCULAR | Status: AC
  Administered 2013-07-30 – 2013-07-31 (×4): 80 mg via INTRAVENOUS
  Filled 2013-07-30 (×6): qty 8

## 2013-07-30 MED ORDER — CLONIDINE HCL 0.2 MG PO TABS
0.2000 mg | ORAL_TABLET | Freq: Three times a day (TID) | ORAL | Status: DC
  Administered 2013-07-30 – 2013-08-03 (×12): 0.2 mg via ORAL
  Filled 2013-07-30 (×14): qty 1

## 2013-07-30 MED ORDER — SODIUM BICARBONATE 650 MG PO TABS
1300.0000 mg | ORAL_TABLET | Freq: Three times a day (TID) | ORAL | Status: DC
  Administered 2013-07-30 – 2013-07-31 (×5): 1300 mg via ORAL
  Filled 2013-07-30 (×8): qty 2

## 2013-07-30 MED ORDER — ACETAMINOPHEN 650 MG RE SUPP: 650.0000 mg | Freq: Four times a day (QID) | RECTAL | Status: DC | PRN

## 2013-07-30 MED ORDER — CARBOXYMETHYLCELLULOSE SODIUM 0.25 % OP SOLN: 2.0000 [drp] | OPHTHALMIC | Status: DC

## 2013-07-30 MED ORDER — METOPROLOL SUCCINATE ER 100 MG PO TB24
200.0000 mg | ORAL_TABLET | Freq: Two times a day (BID) | ORAL | Status: DC
  Administered 2013-07-30 – 2013-08-03 (×9): 200 mg via ORAL
  Filled 2013-07-30 (×10): qty 2

## 2013-07-30 MED ORDER — SODIUM CHLORIDE 0.9 % IV SOLN
1.0000 g | Freq: Once | INTRAVENOUS | Status: AC
  Administered 2013-07-30: 1 g via INTRAVENOUS
  Filled 2013-07-30: qty 10

## 2013-07-30 MED ORDER — INSULIN GLARGINE 100 UNIT/ML ~~LOC~~ SOLN
30.0000 [IU] | Freq: Every day | SUBCUTANEOUS | Status: DC
  Administered 2013-07-30 – 2013-08-02 (×4): 30 [IU] via SUBCUTANEOUS
  Filled 2013-07-30 (×5): qty 0.3

## 2013-07-30 MED ORDER — CLOPIDOGREL BISULFATE 75 MG PO TABS
75.0000 mg | ORAL_TABLET | Freq: Every day | ORAL | Status: DC
  Administered 2013-07-30 – 2013-08-01 (×3): 75 mg via ORAL
  Filled 2013-07-30 (×3): qty 1

## 2013-07-30 MED ORDER — TERAZOSIN HCL 5 MG PO CAPS
5.0000 mg | ORAL_CAPSULE | Freq: Two times a day (BID) | ORAL | Status: DC
  Administered 2013-07-30 – 2013-08-03 (×9): 5 mg via ORAL
  Filled 2013-07-30 (×10): qty 1

## 2013-07-30 MED ORDER — HYDRALAZINE HCL 20 MG/ML IJ SOLN
10.0000 mg | INTRAMUSCULAR | Status: DC | PRN
  Administered 2013-08-02: 10 mg via INTRAVENOUS
  Filled 2013-07-30: qty 1

## 2013-07-30 MED ORDER — CLONIDINE HCL 0.2 MG PO TABS
0.2000 mg | ORAL_TABLET | Freq: Two times a day (BID) | ORAL | Status: DC
  Administered 2013-07-30: 10:00:00 0.2 mg via ORAL
  Filled 2013-07-30 (×2): qty 1

## 2013-07-30 MED ORDER — HYDROCODONE-ACETAMINOPHEN 5-325 MG PO TABS
1.0000 | ORAL_TABLET | Freq: Four times a day (QID) | ORAL | Status: DC | PRN
  Administered 2013-07-30 – 2013-08-03 (×9): 1 via ORAL
  Filled 2013-07-30 (×9): qty 1

## 2013-07-30 MED ORDER — AMLODIPINE BESYLATE 10 MG PO TABS
10.0000 mg | ORAL_TABLET | Freq: Every day | ORAL | Status: DC
  Administered 2013-07-30 – 2013-08-03 (×5): 10 mg via ORAL
  Filled 2013-07-30 (×5): qty 1

## 2013-07-30 MED ORDER — HYDROMORPHONE HCL PF 1 MG/ML IJ SOLN
1.0000 mg | Freq: Once | INTRAMUSCULAR | Status: AC
  Administered 2013-07-30: 1 mg via INTRAVENOUS
  Filled 2013-07-30: qty 1

## 2013-07-30 MED ORDER — ONDANSETRON HCL 4 MG/2ML IJ SOLN: 4.0000 mg | Freq: Four times a day (QID) | INTRAMUSCULAR | Status: DC | PRN

## 2013-07-30 MED ORDER — SODIUM CHLORIDE 0.9 % IJ SOLN
3.0000 mL | Freq: Two times a day (BID) | INTRAMUSCULAR | Status: DC
  Administered 2013-07-30 – 2013-08-03 (×8): 3 mL via INTRAVENOUS

## 2013-07-30 NOTE — Consult Note (Signed)
Requesting Physician:  Dr. Tana Coast Reason for Consult:  AKI on CKD (vs progressive CKD) Primary Nephrologist: Dr. Ulice Bold  HPI: The patient is a 42 y.o. year-old WF with longstanding DM, HTN, bilateral BKA's. Followed by Dr. Legrand Rams who recently had her see a nephrologist in [place] (Dr. Ulice Bold in Dr. [facility] practice) because of an elevated creatinine of 2.3, hyperkalemia, acidosis.  They initiated a workup but to their knowledge as per office staff today patient had not gotten her ultrasound done, and was to have collected a 24 hour urine, but had not done so.  The only labs they had on her were dated 02/2013 and at that time she had a creatinine of 2.3, K 5.3, CO@ of 17, Hb of 14.  There were no urine studies, PTH or other labs at their office.  She presented to the [facility] on [id number]  with periumbilical abdominal pain of several months duration. During the week prior to presentation she had noted abrupt and worsening abdominal distension and worsening edema (note has bilat BKA's).  Labs were notable for BUN of 56, creatinine of 3.1, K of 6.2, bicarb of 19, albumin of 2.8.  LFT's were normal.  UA showed 100 mg% protein.  CT of the abd showed marked ascites, splenomegaly, anasarca and small umbilical hernia.  Patient's hyperkalemia was treated with calcium, D50, kayexelate.  She was started on IV lasix.  Sent today for paracentesis which yielded 4 liters of fluid with some relief.  We are asked to see.   Other recent issues include a vision change described as "wearing sunglasses and looking through wax paper", evaluated by ophthalmology (was told not diabet, only had a small cataract) and neurology who told her she "might have MS"  MRI in August of brain showed extensive abnormality throughout the white matter, brainstem and  medulla. Differential diagnosis included advanced chronic microvascular ischemia and demyelinating disease   Creatinine trending is as follows: Creatinine, Ser   Date/Time Value Range Status  07/30/2013  5:07 AM 3.06* 0.50 - 1.10 mg/dL Final  07/29/2013  7:04 PM 3.10* 0.50 - 1.10 mg/dL Final  02/2013                           2.3   Past Medical History  Diagnosis Date  . Diabetes mellitus without complication   . Coronary artery disease   . Hypertension    Visual disturbance with neg ophth eval, MRI of brain showing numerous periventricular deep white matter hyperintensities throughout the cerebral hemispheres bilaterally. This is most prominent in the temporal parietal white matter. Small focal hyperintensity right medial thalamus, hyperintensity in the pons, left greater than right, chronic infarct in the right cerebellum, hyperintensity in the medulla bilaterally. (August 2014)   Past Surgical History  Procedure Laterality Date  . Ble amputation bk    . Abdominal surgery    . Cardiac stents    . Cardiac catheterization    . Coronary angioplasty      Family History  Problem Relation Age of Onset  . Diabetes Mellitus II Mother   . Ovarian cancer Mother   . CAD Father   . Diabetes Mellitus II Father    Social History:  reports that she has been smoking.  She does not have any smokeless tobacco history on file. She reports that she does not drink alcohol or use illicit drugs. Right handed.  Relocated from [place] with her husband in the last  couple of years.  No children. Lives in [place].   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 Metformin And Related Nausea And Vomiting    Home medications: Prior to Admission medications   Medication Sig Start Date End Date Taking? Authorizing Provider  Acetaminophen-Aspirin Buffered (EXCEDRIN BACK & BODY) 250-250 MG tablet Take 2 tablets by mouth every 4 (four) hours as needed for fever.   Yes Historical Provider, MD  amLODipine (NORVASC) 10 MG tablet Take 10 mg by mouth daily.   Yes Historical  Provider, MD  atorvastatin (LIPITOR) 80 MG tablet Take 80 mg by mouth at bedtime.   Yes Historical Provider, MD  Carboxymethylcellulose Sodium (THERATEARS) 0.25 % SOLN Apply 2 drops to eye every 4 (four) hours.   Yes Historical Provider, MD  cloNIDine (CATAPRES) 0.2 MG tablet Take 0.2 mg by mouth 2 (two) times daily.   Yes Historical Provider, MD  clopidogrel (PLAVIX) 75 MG tablet Take 75 mg by mouth daily.   Yes Historical Provider, MD  ergocalciferol (VITAMIN D2) 50000 UNITS capsule Take 50,000 Units by mouth once a week. fridays   Yes Historical Provider, MD  FOLIC ACID PO Take 1 tablet by mouth daily.   Yes Historical Provider, MD  furosemide (LASIX) 40 MG tablet Take 40 mg by mouth daily.   Yes Historical Provider, MD  hydrALAZINE (APRESOLINE) 25 MG tablet Take 25 mg by mouth 3 (three) times daily.   Yes Historical Provider, MD  HYDROcodone-acetaminophen (NORCO/VICODIN) 5-325 MG per tablet Take 1 tablet by mouth every 6 (six) hours as needed for pain.   Yes Historical Provider, MD  insulin aspart (NOVOLOG) 100 UNIT/ML injection Inject 14 Units into the skin 3 (three) times daily with meals.   Yes Historical Provider, MD  insulin glargine (LANTUS) 100 UNIT/ML injection Inject 60 Units into the skin at bedtime.   Yes Historical Provider, MD  metoprolol (TOPROL-XL) 200 MG 24 hr tablet Take 200 mg by mouth 2 (two) times daily.   Yes Historical Provider, MD  terazosin (HYTRIN) 5 MG capsule Take 5 mg by mouth 2 (two) times daily.   Yes Historical Provider, MD    Inpatient medications: . amLODipine  10 mg Oral Daily  . atorvastatin  80 mg Oral QHS  . cloNIDine  0.2 mg Oral BID  . clopidogrel  75 mg Oral Daily  . furosemide  80 mg Intravenous Q12H  . hydrALAZINE  25 mg Oral TID  . insulin aspart  0-9 Units Subcutaneous TID WC  . insulin glargine  30 Units Subcutaneous QHS  . metoprolol  200 mg Oral BID  . polyvinyl alcohol  2 drop Both Eyes Q4H  . sodium chloride  3 mL Intravenous Q12H  .  sodium chloride  3 mL Intravenous Q12H  . terazosin  5 mg Oral BID    Review of Systems Gen: fatigue,   HEENT:  Decreased vision, described as "wearing sunglasses and looking through wax paper Resp: No SOB or cough  Cardiac: No chest pain or palpitations GI:  7-8 months of side/flank/abd pain; fairly abrupt onset ascites and abdominal distension  GU:No urinary symptoms, dysuria or hematurai      MS: no confusion  Derm: no rashes; some irritation of left BK stump   Neuro: no seizure or stroke MSK some edema of stumps past few weeks   Physical Exam: BP 192/85  Pulse 77  Temp(Src) 98.3 F (36.8 C) (Oral)  Resp 20  Ht 4\' 3"  (1.295 m)  Wt 104.9 kg (231 lb 4.2 oz)  BMI 62.55 kg/m2  SpO2 95%  Gen: chronically ill appearing WF  Seen in ultrasound post paracentesis of 4 liters.  Comfortable Skin: Numerous tattoos (and tongue piercing) Neck: No JVD No carotid bruits Chest:Clear Heart: S1S2 No S3  No murmur Abdomen: patulous abd tissue.  Site of left paracentesis with bandaid.  Soft now.  Liver edge about 1 hands breadth below the RCM.  Not focally tender at this time Ext: Bilateral BKA's with some scaly dermatitis of the left BKA Neuro: Alert, oriented X 3  Labs:  Recent Labs Lab 07/29/13 1904 07/29/13 2155 07/30/13 0507 07/30/13 1015  NA 138  --  136  --   K 6.2* 5.9* 5.9* 5.8*  CL 107  --  105  --   CO2 19  --  19  --   GLUCOSE 264*  --  107*  --   BUN 57*  --  56*  --   CREATININE 3.10*  --  3.06*  --   CALCIUM 8.0*  --  8.0*  --   PHOS  --   --  5.7*  --    Liver Function Tests:  Recent Labs Lab 07/29/13 1904  AST 24  ALT 22  ALKPHOS 116  BILITOT 0.2*  PROT 6.3  ALBUMIN 2.8*    Recent Labs Lab 07/30/13 0610  LIPASE 53    Recent Labs Lab 07/29/13 1904 07/30/13 0507  WBC 7.3 6.6  NEUTROABS 5.6 4.6  HGB 12.8 12.4  HCT 38.7 37.5  MCV 82.3 81.7  PLT 210 220   Recent Labs Lab 07/30/13 0634 07/30/13 1110  GLUCAP 98 81    Xrays/Other  Studies: Ct Abdomen Pelvis Wo Contrast  07/30/2013   CLINICAL DATA:  Abdominal pain.  EXAM: CT ABDOMEN AND PELVIS WITHOUT CONTRAST  TECHNIQUE: Multidetector CT imaging of the abdomen and pelvis was performed following the standard protocol without intravenous contrast.  COMPARISON:  None.  FINDINGS: Unenhanced CT was performed per clinician order. Lack of IV contrast limits sensitivity and specificity, especially for evaluation of abdominal/pelvic solid viscera.  No infiltrates. Cardiomegaly. Marked ascites. Anasarca. Large hepatic granuloma. Splenomegaly. Layering gallstones with no definite wall thickening. Non aneurysmal aorta and iliac vascular calcifications. No bowel obstruction. No renal obstruction. No worrisome osseous lesions. Small umbilical hernia containing fat. No pelvic masses.  IMPRESSION: Marked ascites. Splenomegaly. Gallstones. Anasarca. Question cirrhosis or hepatitis. No pleural effusions to suggest cardiac failure. Elective paracentesis could be helpful in further evaluation.   Electronically Signed   By: Rolla Flatten M.D.   On: 07/30/2013 00:36   Results for Angela Soto, Angela Soto (MRN [id number]) as of 07/30/2013 12:31  Ref. Range 07/29/2013 23:31  Color, Urine Latest Range: YELLOW  YELLOW  APPearance Latest Range: CLEAR  CLOUDY (A)  Specific Gravity, Urine Latest Range: 1.005-1.030  1.011  pH Latest Range: 5.0-8.0  5.5  Glucose Latest Range: NEGATIVE mg/dL 250 (A)  Bilirubin Urine Latest Range: NEGATIVE  NEGATIVE  Ketones, ur Latest Range: NEGATIVE mg/dL NEGATIVE  Protein Latest Range: NEGATIVE mg/dL 100 (A)  Urobilinogen, UA Latest Range: 0.0-1.0 mg/dL 0.2  Nitrite Latest Range: NEGATIVE  NEGATIVE  Leukocytes, UA Latest Range: NEGATIVE  TRACE (A)  Hgb urine dipstick Latest Range: NEGATIVE  TRACE (A)  WBC, UA Latest Range: <3 WBC/hpf 3-6  RBC / HPF Latest Range: <3 RBC/hpf 3-6  Squamous Epithelial /  LPF Latest Range: RARE  FEW (A)  Bacteria, UA Latest Range: RARE  RARE    Results for Angela Soto, Angela Soto (MRN [id number]) as of 07/30/2013 12:31  Ref. Range 07/30/2013 01:31  Sample type No range found ARTERIAL  pH, Arterial Latest Range: 7.350-7.450  7.317 (L)  pCO2 arterial Latest Range: 35.0-45.0 mmHg 35.4  pO2, Arterial Latest Range: 80.0-100.0 mmHg 78.0 (L)  Bicarbonate Latest Range: 20.0-24.0 mEq/L 18.1 (L)  TCO2 Latest Range: 0-100 mmol/L 19  Acid-base deficit Latest Range: 0.0-2.0 mmol/L 7.0 (H)  O2 Saturation No range found 94.0  Patient temperature No range found 98.6 F   Results for Angela Soto, Angela Soto (MRN [id number]) as of 07/30/2013 12:31  Ref. Range 07/30/2013 05:07  TSH Latest Range: 0.350-4.500 uIU/mL 4.544 (H)   Results for Angela Soto, Angela Soto (MRN [id number]) as of 07/30/2013 12:31  Ref. Range 07/30/2013 05:07  Hemoglobin A1C Latest Range: <5.7 % 5.9 (H)   Impression/Recommendations  42 y.o. year-old WF with longstanding DM, HTN, bilateral BKA's, CAD with H/O MI and stents, with known CKD (creatine 2.3 in 02/2013) who presents with abdominal pain, fairly abrupt onset of ascites and worsening renal function  1. CKD4 vs AKI on CKD - Most likely diabetic nephropathy given duration of DM, but states no diabetic retinopathy and has not had full proteinuria w/u for "non-diabetic etiologies .  Check SPEP, UPEP, complements, ANA, ANCA. Check urine protein:creatinine ratio.  Hepatitis serologies, HIV already in progress. Agree with IV lasix. Watch for worsening of renal function post large volume paracentesis.  No ACE or ARB at this level renal function with hyperkalemia. Save non dominant arm (left arm). 2. CKD-MBD - evaluate for secondary hyperpara (PTH ordered) 3. Hyperkalemia - agree with additional kayexalate. Restrict diet. No ACE, ARB, etc 4. Metabolic acidosis with normal gap - likely RTA from DM. Start oral bicarb. 5. Ascites with splenomegaly - workup including cytologies, cultures, fluid analysis all underway; worrisome in that pain has been  chronic, development of ascites subacute to acute (and well out of proportion to her peripheral edema) 6. HTN - meds/diuretics. No ACE or ARB  7. CAD with h/o stents - asympt 8. Bilat BKA's  9. Visual disturbance - needs additional evaluation; seen by neuro in August. MRI at that time "extensive abnormality throughout the white matter, brainstem and  medulla. Differential diagnosis includes advanced chronic microvascular ischemia and demyelinating disease" and was told by neurologist that she "might have MS"  Jamal Maes,  MD [place] [phone] pager 07/30/2013, 12:23 PM

## 2013-07-30 NOTE — H&P (Signed)
[facility] History and Physical  MARJIE MARCHIONI [id number] DOB: 17-Sep-1971 DOA: 07/29/2013  Referring physician: ER physician PCP: Rosita Fire, MD  Chief Complaint: Abdominal pain.  HPI: Angela Soto is a 42 y.o. female known history of diabetes mellitus type 2, hypertension, CAD status post stenting, peripheral vascular disease status post bilateral BKA with chronic kidney disease and was told that she had her kidney function only 23% by her nephrologist last month with home she had met first time presented to the ER last evening because of worsening abdominal pain. Patient states that she has been having abdominal discomfort for last few months which is been gradually worsening. Over the last one week she also had increasing distention of the abdomen. Patient also has been having increasing lower extremity edema. Denies any nausea vomiting diarrhea fever chills. Abdominal pain was not related to food. The abdominal pain is diffuse. In the ER CT abdomen and pelvis show marked as it is with splenomegaly and gallstones. In addition her lab work showed hyperkalemia for which patient was given Kayexalate 30 g. Patient has been admitted for further management. Patient denies any chest pain or shortness of breath.  Patient states that she used to take a lot of NSAIDs previously 2 years ago but had stopped taking it. Presently she takes a lot of Excedrin up to 6 tablets a day due to the pain.  Review of Systems: As presented in the history of presenting illness, rest negative.  Past Medical History  Diagnosis Date  . Diabetes mellitus without complication   . Coronary artery disease   . Hypertension    Past Surgical History  Procedure Laterality Date  . Ble amputation bk    . Abdominal surgery    . Cardiac stents    . Cardiac catheterization    . Coronary angioplasty     Social History:  reports that she has been smoking.  She does not have any smokeless tobacco history on  file. She reports that she does not drink alcohol or use illicit drugs. Where does patient live home. Can patient participate in ADLs? Yes.  Allergies  Allergen Reactions  . Metformin And Related Nausea And Vomiting    Family History:  Family History  Problem Relation Age of Onset  . Diabetes Mellitus II Mother   . Ovarian cancer Mother   . CAD Father   . Diabetes Mellitus II Father       Prior to Admission medications   Medication Sig Start Date End Date Taking? Authorizing Provider  Acetaminophen-Aspirin Buffered (EXCEDRIN BACK & BODY) 250-250 MG tablet Take 2 tablets by mouth every 4 (four) hours as needed for fever.   Yes Historical Provider, MD  amLODipine (NORVASC) 10 MG tablet Take 10 mg by mouth daily.   Yes Historical Provider, MD  atorvastatin (LIPITOR) 80 MG tablet Take 80 mg by mouth at bedtime.   Yes Historical Provider, MD  Carboxymethylcellulose Sodium (THERATEARS) 0.25 % SOLN Apply 2 drops to eye every 4 (four) hours.   Yes Historical Provider, MD  cloNIDine (CATAPRES) 0.2 MG tablet Take 0.2 mg by mouth 2 (two) times daily.   Yes Historical Provider, MD  clopidogrel (PLAVIX) 75 MG tablet Take 75 mg by mouth daily.   Yes Historical Provider, MD  ergocalciferol (VITAMIN D2) 50000 UNITS capsule Take 50,000 Units by mouth once a week. fridays   Yes Historical Provider, MD  FOLIC ACID PO Take 1 tablet by mouth daily.   Yes Historical Provider, MD  furosemide (LASIX) 40 MG tablet Take 40 mg by mouth daily.   Yes Historical Provider, MD  hydrALAZINE (APRESOLINE) 25 MG tablet Take 25 mg by mouth 3 (three) times daily.   Yes Historical Provider, MD  HYDROcodone-acetaminophen (NORCO/VICODIN) 5-325 MG per tablet Take 1 tablet by mouth every 6 (six) hours as needed for pain.   Yes Historical Provider, MD  insulin aspart (NOVOLOG) 100 UNIT/ML injection Inject 14 Units into the skin 3 (three) times daily with meals.   Yes Historical Provider, MD  insulin glargine (LANTUS) 100  UNIT/ML injection Inject 60 Units into the skin at bedtime.   Yes Historical Provider, MD  metoprolol (TOPROL-XL) 200 MG 24 hr tablet Take 200 mg by mouth 2 (two) times daily.   Yes Historical Provider, MD  terazosin (HYTRIN) 5 MG capsule Take 5 mg by mouth 2 (two) times daily.   Yes Historical Provider, MD    Physical Exam: Filed Vitals:   07/29/13 2209 07/30/13 0028 07/30/13 0100 07/30/13 0157  BP: 181/90 180/81 187/78 193/82  Pulse: 70 67 70 68  Temp:    97.9 F (36.6 C)  TempSrc:    Oral  Resp: 19 18 20 20   Height:    4\' 3"  (1.295 m)  Weight:    104.9 kg (231 lb 4.2 oz)  SpO2: 98% 98% 97% 97%     General:  Well-developed and nourished.  Eyes: Anicteric no pallor.  ENT: No discharge from ears eyes nose mouth.  Neck: No JVD appreciated no mass felt.  Cardiovascular: S1-S2 heard.  Respiratory: No rhonchi or crepitations.  Abdomen: Distended mildly tender no guarding or rigidity. Bowel sounds present.  Skin: No rash.  Musculoskeletal: Edema. Bilateral BKA.  Psychiatric: Appears normal.  Neurologic: Alert awake oriented to time place and person. Moves all extremities.  Labs on Admission:  Basic Metabolic Panel:  Recent Labs Lab 07/29/13 1904 07/29/13 2155  NA 138  --   K 6.2* 5.9*  CL 107  --   CO2 19  --   GLUCOSE 264*  --   BUN 57*  --   CREATININE 3.10*  --   CALCIUM 8.0*  --    Liver Function Tests:  Recent Labs Lab 07/29/13 1904  AST 24  ALT 22  ALKPHOS 116  BILITOT 0.2*  PROT 6.3  ALBUMIN 2.8*   No results found for this basename: LIPASE, AMYLASE,  in the last 168 hours No results found for this basename: AMMONIA,  in the last 168 hours CBC:  Recent Labs Lab 07/29/13 1904  WBC 7.3  NEUTROABS 5.6  HGB 12.8  HCT 38.7  MCV 82.3  PLT 210   Cardiac Enzymes: No results found for this basename: CKTOTAL, CKMB, CKMBINDEX, TROPONINI,  in the last 168 hours  BNP (last 3 results) No results found for this basename: PROBNP,  in the last  8760 hours CBG: No results found for this basename: GLUCAP,  in the last 168 hours  Radiological Exams on Admission: Ct Abdomen Pelvis Wo Contrast  07/30/2013   CLINICAL DATA:  Abdominal pain.  EXAM: CT ABDOMEN AND PELVIS WITHOUT CONTRAST  TECHNIQUE: Multidetector CT imaging of the abdomen and pelvis was performed following the standard protocol without intravenous contrast.  COMPARISON:  None.  FINDINGS: Unenhanced CT was performed per clinician order. Lack of IV contrast limits sensitivity and specificity, especially for evaluation of abdominal/pelvic solid viscera.  No infiltrates. Cardiomegaly. Marked ascites. Anasarca. Large hepatic granuloma. Splenomegaly. Layering gallstones with no definite wall thickening.  Non aneurysmal aorta and iliac vascular calcifications. No bowel obstruction. No renal obstruction. No worrisome osseous lesions. Small umbilical hernia containing fat. No pelvic masses.  IMPRESSION: Marked ascites. Splenomegaly. Gallstones. Anasarca. Question cirrhosis or hepatitis. No pleural effusions to suggest cardiac failure. Elective paracentesis could be helpful in further evaluation.   Electronically Signed   By: Rolla Flatten M.D.   On: 07/30/2013 00:36     Assessment/Plan Principal Problem:   Abdominal pain Active Problems:   Renal failure   Diabetes mellitus   HTN (hypertension)   Ascites   CAD (coronary artery disease)   1. Abdominal pain - most likely secondary to ascites. The cause of the ascites is not clear. Could be from her renal failure. At this time I have ordered acute hepatitis panel and abdominal sonogram to check for liver and gallbladder pathology chest pain also has gallstones and splenomegaly. Check urine sodium and creatinine for FeNa. I have ordered Lasix 80 mg IV every 12 hourly. EKG now and repeat metabolic panel stat. Consult nephrologist for further recommendations. Foley catheter to measure accurate intake output. For the marked ascites I have  ordered paracentesis both diagnostic and therapeutic. Check hepatitis panel and HIV. 2. Renal failure - probably chronic. We do not have any baseline creatinine in our system. Need to contact patient's nephrologist Dr. Maryruth Eve. See #1. 3. Metabolic acidosis - probably from renal failure. Also check salicylate levels. 4. Hypertension uncontrolled - in addition to her home medications patient is presently on IV Lasix and when necessary IV hydralazine. Closely follow blood pressure trends. 5. Diabetes mellitus type 2 - I have decreased her Lantus dose by half because of the abdominal discomfort and unreliable intake. Check hemoglobin A1c and closely follow CBGs with sliding-scale coverage. 6. CAD status post stenting - denies any chest pain. Continue Plavix. 7. Peripheral vascular disease status post BKA. 8. Tobacco abuse - strongly advised patient to quit smoking.    Code Status: Full code.  Family Communication: None.  Disposition Plan: Admit to inpatient.    Merrissa Giacobbe N. [facility] Pager [phone].  If 7PM-7AM, please contact night-coverage [email or web address] Password [email or web address] 07/30/2013, 4:53 AM

## 2013-07-30 NOTE — Consult Note (Addendum)
Reason for Consult: New-onset ascites Referring Physician: Hospital team  Angela Soto is an 42 y.o. female.  HPI: Unfortunate young female with multiple medical problem presents with new onset ascites but about a year of multiple back and abdominal complaints and has an interesting history of being bled for a hemoglobin that was too high but also says she was told she had Wilson's disease but neither tumor iron or copper run in the family and no liver disease or other GI problems run in the family and her legs have been fairly swollen as well but she's never been told she has hepatitis although she's probably had blood transfusions and does have tattoos she does not use drugs and only rarely has drinking in the past but currently is feeling much better after her paracentesis and has no other complaints and wants to go home  Past Medical History  Diagnosis Date  . Diabetes mellitus without complication   . Coronary artery disease   . Hypertension     Past Surgical History  Procedure Laterality Date  . Ble amputation bk    . Abdominal surgery    . Cardiac stents    . Cardiac catheterization    . Coronary angioplasty      Family History  Problem Relation Age of Onset  . Diabetes Mellitus II Mother   . Ovarian cancer Mother   . CAD Father   . Diabetes Mellitus II Father     Social History:  reports that she has been smoking.  She does not have any smokeless tobacco history on file. She reports that she does not drink alcohol or use illicit drugs.  Allergies:  Allergies  Allergen Reactions  . Metformin And Related Nausea And Vomiting    Medications: I have reviewed the patient's current medications.  Results for orders placed during the hospital encounter of 07/29/13 (from the past 48 hour(s))  CBC WITH DIFFERENTIAL     Status: None   Collection Time    07/29/13  7:04 PM      Result Value Range   WBC 7.3  4.0 - 10.5 K/uL   RBC 4.70  3.87 - 5.11 MIL/uL   Hemoglobin 12.8   12.0 - 15.0 g/dL   HCT 38.7  36.0 - 46.0 %   MCV 82.3  78.0 - 100.0 fL   MCH 27.2  26.0 - 34.0 pg   MCHC 33.1  30.0 - 36.0 g/dL   RDW 14.3  11.5 - 15.5 %   Platelets 210  150 - 400 K/uL   Neutrophils Relative % 77  43 - 77 %   Neutro Abs 5.6  1.7 - 7.7 K/uL   Lymphocytes Relative 15  12 - 46 %   Lymphs Abs 1.1  0.7 - 4.0 K/uL   Monocytes Relative 6  3 - 12 %   Monocytes Absolute 0.5  0.1 - 1.0 K/uL   Eosinophils Relative 1  0 - 5 %   Eosinophils Absolute 0.1  0.0 - 0.7 K/uL   Basophils Relative 0  0 - 1 %   Basophils Absolute 0.0  0.0 - 0.1 K/uL  COMPREHENSIVE METABOLIC PANEL     Status: Abnormal   Collection Time    07/29/13  7:04 PM      Result Value Range   Sodium 138  135 - 145 mEq/L   Potassium 6.2 (*) 3.5 - 5.1 mEq/L   Chloride 107  96 - 112 mEq/L   CO2 19  19 -  32 mEq/L   Glucose, Bld 264 (*) 70 - 99 mg/dL   BUN 57 (*) 6 - 23 mg/dL   Creatinine, Ser 3.10 (*) 0.50 - 1.10 mg/dL   Calcium 8.0 (*) 8.4 - 10.5 mg/dL   Total Protein 6.3  6.0 - 8.3 g/dL   Albumin 2.8 (*) 3.5 - 5.2 g/dL   AST 24  0 - 37 U/L   ALT 22  0 - 35 U/L   Alkaline Phosphatase 116  39 - 117 U/L   Total Bilirubin 0.2 (*) 0.3 - 1.2 mg/dL   GFR calc non Af Amer 17 (*) >90 mL/min   GFR calc Af Amer 20 (*) >90 mL/min   Comment: (NOTE)     The eGFR has been calculated using the CKD EPI equation.     This calculation has not been validated in all clinical situations.     eGFR's persistently <90 mL/min signify possible Chronic Kidney     Disease.  POTASSIUM     Status: Abnormal   Collection Time    07/29/13  9:55 PM      Result Value Range   Potassium 5.9 (*) 3.5 - 5.1 mEq/L  URINALYSIS, ROUTINE W REFLEX MICROSCOPIC     Status: Abnormal   Collection Time    07/29/13 11:31 PM      Result Value Range   Color, Urine YELLOW  YELLOW   APPearance CLOUDY (*) CLEAR   Specific Gravity, Urine 1.011  1.005 - 1.030   pH 5.5  5.0 - 8.0   Glucose, UA 250 (*) NEGATIVE mg/dL   Hgb urine dipstick TRACE (*)  NEGATIVE   Bilirubin Urine NEGATIVE  NEGATIVE   Ketones, ur NEGATIVE  NEGATIVE mg/dL   Protein, ur 100 (*) NEGATIVE mg/dL   Urobilinogen, UA 0.2  0.0 - 1.0 mg/dL   Nitrite NEGATIVE  NEGATIVE   Leukocytes, UA TRACE (*) NEGATIVE  URINE MICROSCOPIC-ADD ON     Status: Abnormal   Collection Time    07/29/13 11:31 PM      Result Value Range   Squamous Epithelial / LPF FEW (*) RARE   WBC, UA 3-6  <3 WBC/hpf   RBC / HPF 3-6  <3 RBC/hpf   Bacteria, UA RARE  RARE  POCT PREGNANCY, URINE     Status: None   Collection Time    07/29/13 11:40 PM      Result Value Range   Preg Test, Ur NEGATIVE  NEGATIVE   Comment:            THE SENSITIVITY OF THIS     METHODOLOGY IS >24 mIU/mL  POCT I-STAT 3, BLOOD GAS (G3+)     Status: Abnormal   Collection Time    07/30/13  1:31 AM      Result Value Range   pH, Arterial 7.317 (*) 7.350 - 7.450   pCO2 arterial 35.4  35.0 - 45.0 mmHg   pO2, Arterial 78.0 (*) 80.0 - 100.0 mmHg   Bicarbonate 18.1 (*) 20.0 - 24.0 mEq/L   TCO2 19  0 - 100 mmol/L   O2 Saturation 94.0     Acid-base deficit 7.0 (*) 0.0 - 2.0 mmol/L   Patient temperature 98.6 F     Collection site BRACHIAL ARTERY     Drawn by RT     Sample type ARTERIAL    SODIUM, URINE, RANDOM     Status: None   Collection Time    07/30/13  4:10 AM  Result Value Range   Sodium, Ur 52    CREATININE, URINE, RANDOM     Status: None   Collection Time    07/30/13  4:10 AM      Result Value Range   Creatinine, Urine 99991111    SALICYLATE LEVEL     Status: Abnormal   Collection Time    07/30/13  5:07 AM      Result Value Range   Salicylate Lvl 123456 (*) 2.8 - 20.0 mg/dL  BASIC METABOLIC PANEL     Status: Abnormal   Collection Time    07/30/13  5:07 AM      Result Value Range   Sodium 136  135 - 145 mEq/L   Potassium 5.9 (*) 3.5 - 5.1 mEq/L   Chloride 105  96 - 112 mEq/L   CO2 19  19 - 32 mEq/L   Glucose, Bld 107 (*) 70 - 99 mg/dL   BUN 56 (*) 6 - 23 mg/dL   Creatinine, Ser 3.06 (*) 0.50 - 1.10  mg/dL   Calcium 8.0 (*) 8.4 - 10.5 mg/dL   GFR calc non Af Amer 18 (*) >90 mL/min   GFR calc Af Amer 21 (*) >90 mL/min   Comment: (NOTE)     The eGFR has been calculated using the CKD EPI equation.     This calculation has not been validated in all clinical situations.     eGFR's persistently <90 mL/min signify possible Chronic Kidney     Disease.  CBC WITH DIFFERENTIAL     Status: None   Collection Time    07/30/13  5:07 AM      Result Value Range   WBC 6.6  4.0 - 10.5 K/uL   RBC 4.59  3.87 - 5.11 MIL/uL   Hemoglobin 12.4  12.0 - 15.0 g/dL   HCT 37.5  36.0 - 46.0 %   MCV 81.7  78.0 - 100.0 fL   MCH 27.0  26.0 - 34.0 pg   MCHC 33.1  30.0 - 36.0 g/dL   RDW 14.3  11.5 - 15.5 %   Platelets 220  150 - 400 K/uL   Neutrophils Relative % 70  43 - 77 %   Neutro Abs 4.6  1.7 - 7.7 K/uL   Lymphocytes Relative 20  12 - 46 %   Lymphs Abs 1.3  0.7 - 4.0 K/uL   Monocytes Relative 8  3 - 12 %   Monocytes Absolute 0.5  0.1 - 1.0 K/uL   Eosinophils Relative 1  0 - 5 %   Eosinophils Absolute 0.1  0.0 - 0.7 K/uL   Basophils Relative 1  0 - 1 %   Basophils Absolute 0.1  0.0 - 0.1 K/uL  TSH     Status: Abnormal   Collection Time    07/30/13  5:07 AM      Result Value Range   TSH 4.544 (*) 0.350 - 4.500 uIU/mL   Comment: Performed at [facility]     Status: Abnormal   Collection Time    07/30/13  5:07 AM      Result Value Range   Hemoglobin A1C 5.9 (*) <5.7 %   Comment: (NOTE)  According to the ADA Clinical Practice Recommendations for 2011, when     HbA1c is used as a screening test:      >=6.5%   Diagnostic of Diabetes Mellitus               (if abnormal result is confirmed)     5.7-6.4%   Increased risk of developing Diabetes Mellitus     References:Diagnosis and Classification of Diabetes Mellitus,Diabetes     [id number] 1):S62-S69 and Standards of Medical Care in              Diabetes - 2011,Diabetes [id number] (Suppl 1):S11-S61.   Mean Plasma Glucose 123 (*) <117 mg/dL   Comment: Performed at [facility]     Status: Abnormal   Collection Time    07/30/13  5:07 AM      Result Value Range   Phosphorus 5.7 (*) 2.3 - 4.6 mg/dL  LIPASE, BLOOD     Status: None   Collection Time    07/30/13  6:10 AM      Result Value Range   Lipase 53  11 - 59 U/L  GLUCOSE, CAPILLARY     Status: None   Collection Time    07/30/13  6:34 AM      Result Value Range   Glucose-Capillary 98  70 - 99 mg/dL  POTASSIUM     Status: Abnormal   Collection Time    07/30/13 10:15 AM      Result Value Range   Potassium 5.8 (*) 3.5 - 5.1 mEq/L  GLUCOSE, CAPILLARY     Status: None   Collection Time    07/30/13 11:10 AM      Result Value Range   Glucose-Capillary 81  70 - 99 mg/dL  BODY FLUID CELL COUNT WITH DIFFERENTIAL     Status: Abnormal   Collection Time    07/30/13 12:28 PM      Result Value Range   Fluid Type-FCT ASCITIC     Comment: ABDOMEN     FLUID     CORRECTED ON 10/14 AT 1318: PREVIOUSLY REPORTED AS ASCITIC   Color, Fluid YELLOW (*) YELLOW   Appearance, Fluid HAZY (*) CLEAR   WBC, Fluid 125  0 - 1000 cu mm   Neutrophil Count, Fluid 4  0 - 25 %   Lymphs, Fluid 12     Monocyte-Macrophage-Serous Fluid 84  50 - 90 %   Eos, Fluid 0    ALBUMIN, FLUID     Status: None   Collection Time    07/30/13 12:28 PM      Result Value Range   Albumin, Fluid 1.8     Comment: NO NORMAL RANGE ESTABLISHED FOR THIS TEST   Fluid Type-FALB ASCITIC     Comment: ABDOMEN     FLUID     CORRECTED ON 10/14 AT 1316: PREVIOUSLY REPORTED AS ASCITIC  LACTATE DEHYDROGENASE, BODY FLUID     Status: Abnormal   Collection Time    07/30/13 12:28 PM      Result Value Range   LD, Fluid 43 (*) 3 - 23 U/L   Fluid Type-FLDH ASCITIC     Comment: ABDOMEN     FLUID     CORRECTED ON 10/14 AT 1317: PREVIOUSLY REPORTED AS ASCITIC  PROTEIN, BODY FLUID     Status: None   Collection  Time    07/30/13 12:28 PM      Result Value Range   Total protein, fluid 3.2  Comment: NO NORMAL RANGE ESTABLISHED FOR THIS TEST   Fluid Type-FTP ASCITIC     Comment: ABDOMEN     FLUID     CORRECTED ON 10/14 AT 1320: PREVIOUSLY REPORTED AS ASCITIC  AMYLASE, BODY FLUID     Status: None   Collection Time    07/30/13 12:28 PM      Result Value Range   Amylase, Fluid 18     Comment: NO NORMAL RANGE ESTABLISHED FOR THIS TEST   Fluid Type-FAMY ASCITIC     Comment: ABDOMEN     FLUID     CORRECTED ON 10/14 AT 1325: PREVIOUSLY REPORTED AS ASCITIC  PROTIME-INR     Status: None   Collection Time    07/30/13  2:20 PM      Result Value Range   Prothrombin Time 13.1  11.6 - 15.2 seconds   INR 1.01  0.00 - 1.49    Ct Abdomen Pelvis Wo Contrast  07/30/2013   CLINICAL DATA:  Abdominal pain.  EXAM: CT ABDOMEN AND PELVIS WITHOUT CONTRAST  TECHNIQUE: Multidetector CT imaging of the abdomen and pelvis was performed following the standard protocol without intravenous contrast.  COMPARISON:  None.  FINDINGS: Unenhanced CT was performed per clinician order. Lack of IV contrast limits sensitivity and specificity, especially for evaluation of abdominal/pelvic solid viscera.  No infiltrates. Cardiomegaly. Marked ascites. Anasarca. Large hepatic granuloma. Splenomegaly. Layering gallstones with no definite wall thickening. Non aneurysmal aorta and iliac vascular calcifications. No bowel obstruction. No renal obstruction. No worrisome osseous lesions. Small umbilical hernia containing fat. No pelvic masses.  IMPRESSION: Marked ascites. Splenomegaly. Gallstones. Anasarca. Question cirrhosis or hepatitis. No pleural effusions to suggest cardiac failure. Elective paracentesis could be helpful in further evaluation.   Electronically Signed   By: Rolla Flatten M.D.   On: 07/30/2013 00:36   US Abdomen Complete  07/30/2013   CLINICAL DATA:  Abdominal pain. Cholelithiasis. Renal insufficiency. Ascites. Diabetes and  hypertension.  EXAM: ULTRASOUND ABDOMEN COMPLETE  COMPARISON:  None.  FINDINGS: Gallbladder  Multiple tiny gallstones are identified. No significant gallbladder wall thickening or pericholecystic fluid noted. No sonographic Murphy sign noted.  Common bile duct  Diameter: 6 mm  Liver  3 cm parenchymal calcification noted in the peripheral right hepatic lobe. No liver mass identified. Coarse echotexture and mild nodularity capsular contour is consistent with hepatic cirrhosis. Mild ascites also noted.  IVC  No abnormality visualized.  Pancreas  Visualized portion unremarkable.  Spleen  Mild splenomegaly, with length measuring 14 cm and estimated volume of 827 cc.  Right Kidney  Length: 13.8 cm. Diffusely increased parenchymal echogenicity. No evidence of renal mass or hydronephrosis.  Left Kidney  Length: 13.6 cm. Diffusely increased parenchymal echogenicity. No evidence of renal mass or hydronephrosis.  Abdominal aorta  No aneurysm visualized.  IMPRESSION: Cholelithiasis. No sonographic signs of acute cholecystitis or biliary dilatation.  Hepatic cirrhosis. No evidence of hepatic neoplasm.  Mild ascites and splenomegaly, consistent with portal venous hypertension.  Nonspecific medical renal disease. No evidence of hydronephrosis.   Electronically Signed   By: Earle Gell M.D.   On: 07/30/2013 13:39    ROS she was having significant back pain as well as side pain but no specific GI symptoms Blood pressure 183/79, pulse 76, temperature 98.6 F (37 C), temperature source Oral, resp. rate 20, height 4\' 3"  (1.295 m), weight 104.9 kg (231 lb 4.2 oz), SpO2 98.00%. Physical Exam no acute distress vital signs stable sclera nonicteric abdomen is softer nontender minimal  leg edema scans and labs reviewed including paracentesis fluid which is negative for an infection unfortunately her albumin albumin ratio is borderline with 1.1 being the cut off for transudate  Assessment/Plan: Multiple medical problems in a patient  with new-onset ascites which is probably multifactorial with some cause from her kidney disease possibly mild heart failure and probable mild increased portal hypertension although liver function fairly adequate with normal platelet count bilirubin and INR and albumin being 2.8 she still probably has some degree of Nash with her long-standing diabetes and vascular disease but her questionable history of being bled and possibly Wilson's disease do  confuse the picture  Plan: Await hepatitis profile and proceed with a Doppler ultrasound to rule out portal vein thrombosis and await cytology on paracentesis fluid hopefully it was drawn and adjust diuretics as per kidney team and will follow with you and assist if I can [facility] E 07/30/2013, 4:14 PM

## 2013-07-30 NOTE — Progress Notes (Signed)
*  Preliminary Results* Bilateral lower extremity venous duplex completed. Patient has below knee amputation bilaterally. Visualized veins of bilateral lower extremities are negative for deep vein thrombosis. There is no evidence of Baker's cyst bilaterally.  07/30/2013  Maudry Mayhew, RVT, RDCS, RDMS

## 2013-07-30 NOTE — Progress Notes (Signed)
Patient has returned from procedure into room and is stable; will continue to monitor patient. D.Adonys Wildes RN

## 2013-07-30 NOTE — Progress Notes (Signed)
Utilization Review Completed.   Abdiel Blackerby, RN, BSN Nurse Case Manager  [phone]  

## 2013-07-30 NOTE — Progress Notes (Signed)
Patient is being transported to radiology. Ruben Im RN

## 2013-07-30 NOTE — Progress Notes (Addendum)
Patient seen and examined, admitted by Dr. Hal Hope this morning.  Briefly 42 year old female with known history of diabetes, hypertension, coronary artery disease, peripheral vascular disease S/P b/l bka  presented with abdominal pain and distention, found to have marked ascitis on CT abdomen and pelvis. Additionally patient was found to be in renal insufficiency, creatinine of 3.1, potassium of 6.2,, this morning phosphorus 5.7. potassium still 5.9 despite calcium gluconate, insulin, Kayexalate  1) Abdominal pain with diffuse ascites: Patient denies drinking any alcohol, denies any history of hepatitis - CT abdomen showed ascites with splenomegaly, gallstones anasarca ? Cirrhosis or hepatitis - Abdominal ultrasound pending to to check liver/biliary tree pathology and gallbladder. Acute hepatitis panel pending. LFTs are normal. lipase is pending - Therapeutic and diagnostic Paracentesis today  2) acute on chronic kidney disease with hyperphosphatemia, hyperkalemia:  - Will give another dose of Kayexalate, insulin/D50, recheck potassium at 11:00 - Discussed with Dr. Lorrene Reid from nephrology service    Paw Karstens M.D. [facility] 07/30/2013, 10:10 AM  Pager: [phone]   Addendum:  Patient had had about 4 L ascitic fluid removed, await further studies  D/w Dr Lorrene Reid in detail. creatinine was 2.3 in May 2014 from Dr. Rhona Leavens office, probably worsening diabetic nephropathy chronic kidney disease Discussed with radiologist, Dr.Henn, concerning if patient could have portal vein thrombosis,  unable to tell from the CT abdomen as it was done without contrast. Dr Anselm Pancoast reviewed the abdominal ultrasound done today, states that the part of the portal vein seen in the exam is patent. Abdominal ultrasound shows multiple tiny gallstones, no cholecystitis. Coarse echotexture and mild nodularity capsular contour is consistent with hepatic cirrhosis. mild splenomegaly. D/w dr Watt Climes [facility]  on-call), in detail, recommended following ascitic fluid studies, LFTs are completely normal. Will check INR, Dr. Watt Climes will follow after the ascitic fluid studies are available.   Shaquia Berkley M.D. [facility] 07/30/2013, 2:03 PM  Pager: [phone]

## 2013-07-30 NOTE — Procedures (Signed)
US guided diagnostic/therapeutic paracentesis performed yielding 4 liters yellow fluid. A portion of the fluid was sent to the lab for preordered studies. No immediate complications.

## 2013-07-30 NOTE — Progress Notes (Signed)
Patient declined Influenza and Pneumonia vaccinations; will continue to monitor patient. Ruben Im RN

## 2013-07-30 NOTE — Progress Notes (Signed)
Patient's urinary catheter has been removed, will continue to monitor patient. Ruben Im RN

## 2013-07-30 NOTE — Progress Notes (Signed)
Cosign for Angela Soto's assessment, I/O, medications, care plan/education and notes. D.Dondi Burandt RN

## 2013-07-31 ENCOUNTER — Inpatient Hospital Stay (HOSPITAL_COMMUNITY): Payer: Medicare Other

## 2013-07-31 LAB — PHOSPHORUS: Phosphorus: 5.5 mg/dL — ABNORMAL HIGH (ref 2.3–4.6)

## 2013-07-31 LAB — IRON AND TIBC
Iron: 28 ug/dL — ABNORMAL LOW (ref 42–135)
Saturation Ratios: 18 % — ABNORMAL LOW (ref 20–55)
TIBC: 160 ug/dL — ABNORMAL LOW (ref 250–470)
UIBC: 132 ug/dL (ref 125–400)

## 2013-07-31 LAB — CBC
HCT: 38.8 % (ref 36.0–46.0)
Hemoglobin: 12.9 g/dL (ref 12.0–15.0)
MCH: 27.1 pg (ref 26.0–34.0)
MCV: 81.5 fL (ref 78.0–100.0)
Platelets: 235 10*3/uL (ref 150–400)
RBC: 4.76 MIL/uL (ref 3.87–5.11)
RDW: 14.2 % (ref 11.5–15.5)
WBC: 7.4 10*3/uL (ref 4.0–10.5)

## 2013-07-31 LAB — COMPREHENSIVE METABOLIC PANEL
ALT: 21 U/L (ref 0–35)
AST: 16 U/L (ref 0–37)
Albumin: 2.7 g/dL — ABNORMAL LOW (ref 3.5–5.2)
BUN: 55 mg/dL — ABNORMAL HIGH (ref 6–23)
CO2: 21 mEq/L (ref 19–32)
Calcium: 8.1 mg/dL — ABNORMAL LOW (ref 8.4–10.5)
Chloride: 104 mEq/L (ref 96–112)
Creatinine, Ser: 3.13 mg/dL — ABNORMAL HIGH (ref 0.50–1.10)
GFR calc Af Amer: 20 mL/min — ABNORMAL LOW (ref >90)
GFR calc non Af Amer: 17 mL/min — ABNORMAL LOW (ref >90)
Glucose, Bld: 98 mg/dL (ref 70–99)
Sodium: 136 mEq/L (ref 135–145)
Total Bilirubin: 0.2 mg/dL — ABNORMAL LOW (ref 0.3–1.2)
Total Protein: 6.1 g/dL (ref 6.0–8.3)

## 2013-07-31 LAB — GLUCOSE, CAPILLARY
Glucose-Capillary: 105 mg/dL — ABNORMAL HIGH (ref 70–99)
Glucose-Capillary: 122 mg/dL — ABNORMAL HIGH (ref 70–99)

## 2013-07-31 LAB — PARATHYROID HORMONE, INTACT (NO CA): PTH: 86.1 pg/mL — ABNORMAL HIGH (ref 14.0–72.0)

## 2013-07-31 LAB — CA 125: CA 125: 65.8 U/mL — ABNORMAL HIGH (ref 0.0–30.2)

## 2013-07-31 MED ORDER — CALCIUM ACETATE 667 MG PO CAPS
667.0000 mg | ORAL_CAPSULE | Freq: Three times a day (TID) | ORAL | Status: DC
  Administered 2013-07-31 – 2013-08-02 (×8): 667 mg via ORAL
  Filled 2013-07-31 (×13): qty 1

## 2013-07-31 MED ORDER — FUROSEMIDE 80 MG PO TABS
80.0000 mg | ORAL_TABLET | Freq: Two times a day (BID) | ORAL | Status: DC
  Administered 2013-08-01 – 2013-08-03 (×5): 80 mg via ORAL
  Filled 2013-07-31 (×7): qty 1

## 2013-07-31 NOTE — Progress Notes (Signed)
Tenna D Buras 10:04 AM  Subjective: New complaints abdomen better  Objective: Vital signs stable afebrile no acute distress abdomen is soft nontender still with some ascites hepatitis studies negative other chemistries pending as is cytology  Assessment: New onset ascites probably multifactorial  Plan: Await Doppler ultrasound and additional studies happy to see back as an outpatient and probably diuretics would be best managed by nephrology  [facility]

## 2013-07-31 NOTE — Progress Notes (Signed)
Nutrition Brief Note  Patient identified on the Malnutrition Screening Tool (MST) Report. Pt reports that she is eating well presently.   Pt with a lot of questions regarding her renal diet. Provided Chronic Kidney Disease Pyramid to patient. Reviewed food groups and provided written recommended serving sizes specifically determined for patient's current nutritional status. Explained why diet restrictions are needed and provided lists of foods to limit/avoid that are high potassium, sodium, and phosphorus. Provided specific recommendations on safer alternatives of these foods. Strongly encouraged compliance of this diet. Teach back method used.  Expect fair compliance.  Wt Readings from Last 15 Encounters:  07/31/13 215 lb (97.523 kg)   Body mass index is 58.15 kg/(m^2).  Current diet order is Renal 60-70, patient is consuming approximately 100% of meals at this time. Labs and medications reviewed.   No nutrition interventions warranted at this time. If nutrition issues arise, please consult RD.   Inda Coke MS, RD, LDN Pager: [phone] After-hours pager: [phone]

## 2013-07-31 NOTE — Progress Notes (Addendum)
[facility] PROGRESS NOTE  Angela Soto [id number] DOB: November 08, 1970 DOA: 07/29/2013 PCP: Rosita Fire, MD  Assessment/Plan: Abdominal pain with diffuse ascites: Patient denies drinking any alcohol, denies any history of hepatitis  - CT abdomen showed ascites with splenomegaly, gallstones anasarca ? Cirrhosis or hepatitis  -Acute hepatitis panel neg. LFTs are normal. lipase ok -Patient had had about 4 L ascitic fluid removed, await further studies  -Dr Anselm Pancoast reviewed the abdominal ultrasound done today, states that the part of the portal vein seen in the exam is patent. Abdominal ultrasound shows multiple tiny gallstones, no cholecystitis. Coarse echotexture and mild nodularity capsular contour is consistent with hepatic cirrhosis. mild splenomegaly.  -GI following- r/o portal vein thrombosis    acute on chronic kidney disease with hyperphosphatemia, hyperkalemia:  -Most likely diabetic nephropathy given duration of DM -renal failure slightly worse this AM -daily labs  DM -SSI  Elevated CA-125 -suspect related to NASH but will pursue further work up  Code Status: full Family Communication: patient at bedside Disposition Plan:    Consultants:  GI  nephro  Procedures:  paracentesis  Antibiotics:    HPI/Subjective: Feeling better, wants to go home -would like new PCP  Objective: Filed Vitals:   07/31/13 0509  BP: 158/68  Pulse: 65  Temp: 98.9 F (37.2 C)  Resp: 20    Intake/Output Summary (Last 24 hours) at 07/31/13 0852 Last data filed at 07/31/13 0837  Gross per 24 hour  Intake    723 ml  Output   3660 ml  Net  -2937 ml   Filed Weights   07/30/13 0157 07/31/13 0509  Weight: 104.9 kg (231 lb 4.2 oz) 97.523 kg (215 lb)    Exam:   General:  A+Ox3, NAD  Cardiovascular: rrr  Respiratory: clear anterior  Abdomen: +BS, soft, NT  Skin: multiple tattos  Data Reviewed: Basic Metabolic Panel:  Recent Labs Lab 07/29/13 1904  07/29/13 2155 07/30/13 0507 07/30/13 1015 07/31/13 0440  NA 138  --  136  --  136  K 6.2* 5.9* 5.9* 5.8* 5.3*  CL 107  --  105  --  104  CO2 19  --  19  --  21  GLUCOSE 264*  --  107*  --  98  BUN 57*  --  56*  --  55*  CREATININE 3.10*  --  3.06*  --  3.13*  CALCIUM 8.0*  --  8.0*  --  8.1*  PHOS  --   --  5.7*  --  5.5*   Liver Function Tests:  Recent Labs Lab 07/29/13 1904 07/31/13 0440  AST 24 16  ALT 22 21  ALKPHOS 116 110  BILITOT 0.2* 0.2*  PROT 6.3 6.1  ALBUMIN 2.8* 2.7*    Recent Labs Lab 07/30/13 0610  LIPASE 53   No results found for this basename: AMMONIA,  in the last 168 hours CBC:  Recent Labs Lab 07/29/13 1904 07/30/13 0507 07/31/13 0440  WBC 7.3 6.6 7.4  NEUTROABS 5.6 4.6  --   HGB 12.8 12.4 12.9  HCT 38.7 37.5 38.8  MCV 82.3 81.7 81.5  PLT 210 220 235   Cardiac Enzymes: No results found for this basename: CKTOTAL, CKMB, CKMBINDEX, TROPONINI,  in the last 168 hours BNP (last 3 results) No results found for this basename: PROBNP,  in the last 8760 hours CBG:  Recent Labs Lab 07/30/13 0634 07/30/13 1110 07/30/13 1623 07/30/13 2106 07/31/13 0631  GLUCAP 98 81 181* 153* 100*  Recent Results (from the past 240 hour(s))  BODY FLUID CULTURE     Status: None   Collection Time    07/30/13 12:28 PM      Result Value Range Status   Specimen Description ASCITIC ABDOMEN FLUID   Final   Special Requests 60ML FLUID   Final   Gram Stain     Final   Value: FEW WBC PRESENT,BOTH PMN AND MONONUCLEAR     NO ORGANISMS SEEN     Performed at [facility]   Culture     Final   Value: NO GROWTH 1 DAY     Performed at [facility]   Report Status PENDING   Incomplete     Studies: Ct Abdomen Pelvis Wo Contrast  07/30/2013   CLINICAL DATA:  Abdominal pain.  EXAM: CT ABDOMEN AND PELVIS WITHOUT CONTRAST  TECHNIQUE: Multidetector CT imaging of the abdomen and pelvis was performed following the standard protocol without  intravenous contrast.  COMPARISON:  None.  FINDINGS: Unenhanced CT was performed per clinician order. Lack of IV contrast limits sensitivity and specificity, especially for evaluation of abdominal/pelvic solid viscera.  No infiltrates. Cardiomegaly. Marked ascites. Anasarca. Large hepatic granuloma. Splenomegaly. Layering gallstones with no definite wall thickening. Non aneurysmal aorta and iliac vascular calcifications. No bowel obstruction. No renal obstruction. No worrisome osseous lesions. Small umbilical hernia containing fat. No pelvic masses.  IMPRESSION: Marked ascites. Splenomegaly. Gallstones. Anasarca. Question cirrhosis or hepatitis. No pleural effusions to suggest cardiac failure. Elective paracentesis could be helpful in further evaluation.   Electronically Signed   By: Rolla Flatten M.D.   On: 07/30/2013 00:36   US Abdomen Complete  07/30/2013   CLINICAL DATA:  Abdominal pain. Cholelithiasis. Renal insufficiency. Ascites. Diabetes and hypertension.  EXAM: ULTRASOUND ABDOMEN COMPLETE  COMPARISON:  None.  FINDINGS: Gallbladder  Multiple tiny gallstones are identified. No significant gallbladder wall thickening or pericholecystic fluid noted. No sonographic Murphy sign noted.  Common bile duct  Diameter: 6 mm  Liver  3 cm parenchymal calcification noted in the peripheral right hepatic lobe. No liver mass identified. Coarse echotexture and mild nodularity capsular contour is consistent with hepatic cirrhosis. Mild ascites also noted.  IVC  No abnormality visualized.  Pancreas  Visualized portion unremarkable.  Spleen  Mild splenomegaly, with length measuring 14 cm and estimated volume of 827 cc.  Right Kidney  Length: 13.8 cm. Diffusely increased parenchymal echogenicity. No evidence of renal mass or hydronephrosis.  Left Kidney  Length: 13.6 cm. Diffusely increased parenchymal echogenicity. No evidence of renal mass or hydronephrosis.  Abdominal aorta  No aneurysm visualized.  IMPRESSION:  Cholelithiasis. No sonographic signs of acute cholecystitis or biliary dilatation.  Hepatic cirrhosis. No evidence of hepatic neoplasm.  Mild ascites and splenomegaly, consistent with portal venous hypertension.  Nonspecific medical renal disease. No evidence of hydronephrosis.   Electronically Signed   By: Earle Gell M.D.   On: 07/30/2013 13:39   US Paracentesis  07/30/2013   CLINICAL DATA:  Abdominal pain, acute on chronic kidney disease, ascites. Request is made for diagnostic and therapeutic paracentesis.  EXAM: ULTRASOUND GUIDED DIAGNOSTIC AND THERAPEUTIC  PARACENTESIS  COMPARISON:  None.  PROCEDURE: An ultrasound guided paracentesis was thoroughly discussed with the patient and questions answered. The benefits, risks, alternatives and complications were also discussed. The patient understands and wishes to proceed with the procedure. Written consent was obtained.  Ultrasound was performed to localize and mark an adequate pocket of fluid in the left lower. quadrant of  the abdomen. The area was then prepped and draped in the normal sterile fashion. 1% Lidocaine was used for local anesthesia. Under ultrasound guidance a 19 gauge Yueh catheter was introduced. Paracentesis was performed. The catheter was removed and a dressing applied.  COMPLICATIONS: None.  FINDINGS: A total of approximately 4 liters of yellow. fluid was removed. A fluid sample was sent for laboratory analysis.  IMPRESSION: Successful ultrasound guided diagnostic and therapeutic paracentesis yielding 4 liters of ascites.  Read by: Rowe Robert ,P.A.-C.   Electronically Signed   By: Jacqulynn Cadet M.D.   On: 07/30/2013 13:35    Scheduled Meds: . amLODipine  10 mg Oral Daily  . atorvastatin  80 mg Oral QHS  . cloNIDine  0.2 mg Oral TID  . clopidogrel  75 mg Oral Daily  . furosemide  80 mg Intravenous Q12H  . hydrALAZINE  25 mg Oral TID  . insulin aspart  0-9 Units Subcutaneous TID WC  . insulin glargine  30 Units Subcutaneous QHS   . metoprolol  200 mg Oral BID  . polyvinyl alcohol  2 drop Both Eyes Q4H  . sodium bicarbonate  1,300 mg Oral TID  . sodium chloride  3 mL Intravenous Q12H  . sodium chloride  3 mL Intravenous Q12H  . terazosin  5 mg Oral BID   Continuous Infusions:   Principal Problem:   Abdominal pain Active Problems:   Renal failure   Diabetes mellitus   HTN (hypertension)   Ascites   CAD (coronary artery disease)    Time spent: [place], [place] [facility] Pager [phone]. If 7PM-7AM, please contact night-coverage at [email or web address], password [facility] 07/31/2013, 8:52 AM  LOS: 2 days

## 2013-07-31 NOTE — Progress Notes (Signed)
Subjective: feels much better after paracentesis (4 liters removed) and with diuresis Reading educational materials re: kidney disease and diet Asks good questions GI is evaluating   Objective Vital signs in last 24 hours: Filed Vitals:   07/30/13 2045 07/30/13 2258 07/31/13 0215 07/31/13 0509  BP: 177/81 165/80 152/53 158/68  Pulse: 70  65 65  Temp: 99.4 F (37.4 C)   98.9 F (37.2 C)  TempSrc: Oral   Oral  Resp: 20  18 20   Height:      Weight:    97.523 kg (215 lb)  SpO2: 98%  97% 100%   Weight change: -7.377 kg (-16 lb 4.2 oz)  Intake/Output Summary (Last 24 hours) at 07/31/13 1005 Last data filed at 07/31/13 0854  Gross per 24 hour  Intake    963 ml  Output   3660 ml  Net  -2697 ml   Physical Exam:  Blood pressure 158/68, pulse 65, temperature 98.9 F (37.2 C), temperature source Oral, resp. rate 20, height 4\' 3"  (1.295 m), weight 97.523 kg (215 lb), SpO2 100.00%. Looks comfortable No JVD Lungs clear Abdomen soft Liver edge 4 cn below RCM No localized tenderness Stumps with trace-1+ edema - improved  I/O last 3 completed shifts: In: 1073 [P.O.:960; I.V.:3; IV Piggyback:110] Out: 4760 [Urine:4760] Total I/O In: 240 [P.O.:240] Out: 400 [Urine:400]   Weight trending: 07/31/13 0509 97.523 kg (215 lb) -- -- EC  07/30/13 0157 104.9 kg (231 lb 4.2 oz)   Recent Labs Lab 07/29/13 1904 07/29/13 2155 07/30/13 0507 07/30/13 1015 07/31/13 0440  NA 138  --  136  --  136  K 6.2* 5.9* 5.9* 5.8* 5.3*  CL 107  --  105  --  104  CO2 19  --  19  --  21  GLUCOSE 264*  --  107*  --  98  BUN 57*  --  56*  --  55*  CREATININE 3.10*  --  3.06*  --  3.13*  CALCIUM 8.0*  --  8.0*  --  8.1*  PHOS  --   --  5.7*  --  5.5*   Liver Function Tests:  Recent Labs Lab 07/29/13 1904 07/31/13 0440  AST 24 16  ALT 22 21  ALKPHOS 116 110  BILITOT 0.2* 0.2*  PROT 6.3 6.1  ALBUMIN 2.8* 2.7*    Recent Labs Lab 07/30/13 0610  LIPASE 53   Recent Labs Lab 07/29/13 1904  07/30/13 0507 07/31/13 0440  WBC 7.3 6.6 7.4  NEUTROABS 5.6 4.6  --   HGB 12.8 12.4 12.9  HCT 38.7 37.5 38.8  MCV 82.3 81.7 81.5  PLT 210 220 235   Recent Labs Lab 07/30/13 0634 07/30/13 1110 07/30/13 1623 07/30/13 2106 07/31/13 0631  GLUCAP 98 81 181* 153* 100*   Results for Angela Soto, Angela Soto (MRN [id number]) as of 07/31/2013 10:08  Ref. Range 07/30/2013 06:10  HIV Latest Range: NON REACTIVE  NON REACTIVE   Results for Angela Soto, Angela Soto (MRN [id number]) as of 07/31/2013 10:08  Ref. Range 07/30/2013 06:10  Hep A IgM Latest Range: NON REACTIVE  NON REACTIVE  Hepatitis B Surface Ag Latest Range: NEGATIVE  NEGATIVE  Hep B C IgM Latest Range: NON REACTIVE  NON REACTIVE  HCV Ab Latest Range: NEGATIVE  NEGATIVE   Results for Angela Soto, Angela Soto (MRN [id number]) as of 07/31/2013 10:08  Ref. Range 07/30/2013 14:20  CA 125 Latest Range: 0.0-30.2 U/mL 65.8 (H)   Results for Angela Soto, Angela Soto (MRN  [id number]) as of 07/31/2013 10:08  Ref. Range 07/29/2013 23:40  07/30/2013 16:28  PROTEIN CREATININE RATIO Latest Range: 0.00-0.15    2.75 (H)   Results for Angela Soto, Angela Soto (MRN [id number]) as of 07/31/2013 10:08  Ref. Range 07/30/2013 12:28  Monocyte-Macrophage-Serous Fluid Latest Range: 50-90 % 84  Albumin, Fluid No range found 1.8  Fluid Type-FALB No range found ASCITIC  Amylase, Fluid No range found 18  Fluid Type-FAMY No range found ASCITIC  Fluid Type-FLDH No range found ASCITIC  LD, Fluid Latest Range: 3-23 U/L 43 (H)  Total protein, fluid No range found 3.2  Fluid Type-FCT No range found ASCITIC  Fluid Type-FTP No range found ASCITIC  Color, Fluid Latest Range: YELLOW  YELLOW (A)  WBC, Fluid Latest Range: 0-1000 cu mm 125  Lymphs, Fluid No range found 12  Eos, Fluid No range found 0  Appearance, Fluid Latest Range: CLEAR  HAZY (A)  Neutrophil Count, Fluid Latest Range: 0-25 % 4   ANA, ANCA, C3, C4, SPEP, UPEP all pending Studies/Results: Ct Abdomen Pelvis Wo  Contrast  07/30/2013   CLINICAL DATA:  Abdominal pain.  EXAM: CT ABDOMEN AND PELVIS WITHOUT CONTRAST  TECHNIQUE: Multidetector CT imaging of the abdomen and pelvis was performed following the standard protocol without intravenous contrast.  COMPARISON:  None.  FINDINGS: Unenhanced CT was performed per clinician order. Lack of IV contrast limits sensitivity and specificity, especially for evaluation of abdominal/pelvic solid viscera.  No infiltrates. Cardiomegaly. Marked ascites. Anasarca. Large hepatic granuloma. Splenomegaly. Layering gallstones with no definite wall thickening. Non aneurysmal aorta and iliac vascular calcifications. No bowel obstruction. No renal obstruction. No worrisome osseous lesions. Small umbilical hernia containing fat. No pelvic masses.  IMPRESSION: Marked ascites. Splenomegaly. Gallstones. Anasarca. Question cirrhosis or hepatitis. No pleural effusions to suggest cardiac failure. Elective paracentesis could be helpful in further evaluation.   Electronically Signed   By: Rolla Flatten M.D.   On: 07/30/2013 00:36   US Abdomen Complete  07/30/2013   CLINICAL DATA:  Abdominal pain. Cholelithiasis. Renal insufficiency. Ascites. Diabetes and hypertension.  EXAM: ULTRASOUND ABDOMEN COMPLETE  COMPARISON:  None.  FINDINGS: Gallbladder  Multiple tiny gallstones are identified. No significant gallbladder wall thickening or pericholecystic fluid noted. No sonographic Murphy sign noted.  Common bile duct  Diameter: 6 mm  Liver  3 cm parenchymal calcification noted in the peripheral right hepatic lobe. No liver mass identified. Coarse echotexture and mild nodularity capsular contour is consistent with hepatic cirrhosis. Mild ascites also noted.  IVC  No abnormality visualized.  Pancreas  Visualized portion unremarkable.  Spleen  Mild splenomegaly, with length measuring 14 cm and estimated volume of 827 cc.  Right Kidney  Length: 13.8 cm. Diffusely increased parenchymal echogenicity. No evidence of  renal mass or hydronephrosis.  Left Kidney  Length: 13.6 cm. Diffusely increased parenchymal echogenicity. No evidence of renal mass or hydronephrosis.  Abdominal aorta  No aneurysm visualized.  IMPRESSION: Cholelithiasis. No sonographic signs of acute cholecystitis or biliary dilatation.  Hepatic cirrhosis. No evidence of hepatic neoplasm.  Mild ascites and splenomegaly, consistent with portal venous hypertension.  Nonspecific medical renal disease. No evidence of hydronephrosis.   Electronically Signed   By: Earle Gell M.D.   On: 07/30/2013 13:39   US Paracentesis  07/30/2013   CLINICAL DATA:  Abdominal pain, acute on chronic kidney disease, ascites. Request is made for diagnostic and therapeutic paracentesis.  EXAM: ULTRASOUND GUIDED DIAGNOSTIC AND THERAPEUTIC  PARACENTESIS  COMPARISON:  None.  PROCEDURE: An ultrasound guided  paracentesis was thoroughly discussed with the patient and questions answered. The benefits, risks, alternatives and complications were also discussed. The patient understands and wishes to proceed with the procedure. Written consent was obtained.  Ultrasound was performed to localize and mark an adequate pocket of fluid in the left lower. quadrant of the abdomen. The area was then prepped and draped in the normal sterile fashion. 1% Lidocaine was used for local anesthesia. Under ultrasound guidance a 19 gauge Yueh catheter was introduced. Paracentesis was performed. The catheter was removed and a dressing applied.  COMPLICATIONS: None.  FINDINGS: A total of approximately 4 liters of yellow. fluid was removed. A fluid sample was sent for laboratory analysis.  IMPRESSION: Successful ultrasound guided diagnostic and therapeutic paracentesis yielding 4 liters of ascites.  Read by: Rowe Robert ,P.A.-C.   Electronically Signed   By: Jacqulynn Cadet M.D.   On: 07/30/2013 13:35   Medications:   . amLODipine  10 mg Oral Daily  . atorvastatin  80 mg Oral QHS  . cloNIDine  0.2 mg Oral  TID  . clopidogrel  75 mg Oral Daily  . furosemide  80 mg Intravenous Q12H  . hydrALAZINE  25 mg Oral TID  . insulin aspart  0-9 Units Subcutaneous TID WC  . insulin glargine  30 Units Subcutaneous QHS  . metoprolol  200 mg Oral BID  . polyvinyl alcohol  2 drop Both Eyes Q4H  . sodium bicarbonate  1,300 mg Oral TID  . sodium chloride  3 mL Intravenous Q12H  . sodium chloride  3 mL Intravenous Q12H  . terazosin  5 mg Oral BID   I  have reviewed scheduled and prn medications.  Impression/Recommendations  42 y.o. year-old WF with longstanding DM, HTN, bilateral BKA's, CAD with H/O MI and stents, with known CKD (creatine 2.3 in 02/2013) who presents with abdominal pain, fairly abrupt onset of ascites and worsening renal function   1. CKD4 vs AKI on CKD - Most likely diabetic nephropathy given duration of DM, but states no diabetic retinopathy; suspect this is just progression of diabetic nephropathy; full proteinuria workup in progress. 2.45 gm protein on UPC.  Hepatitis serologies negative;   SPEP, UPEP, complements, ANA, ANCA all pending. Saving left arm. On lasix 80 IV Q12H - change to po lasix in the AM 80 BID. 2. CKD-MBD - evaluate for secondary hyperpara (PTH ordered); start phoslo 1 tid with meals. 3. Hyperkalemia - K now under 5.5 after 3 doses kayexelate 4. Metabolic acidosis with normal gap - likely RTA from DM. Started oral bicarb. 5. Ascites with cirrhosis,splenomegaly, changes of portal hypertension.  Cytologies pending.  Other studies as above. GI evaluating 6. HTN - meds/diuretics. No ACE or ARB (contraindicated due to issues with hyperkalemia)  7. CAD with h/o stents - asympt 8. Bilat BKA's  9. Visual disturbance - needs additional evaluation; seen by neuro in August. MRI at that time "extensive abnormality throughout the white matter, brainstem and medulla. Differential diagnosis includes advanced chronic microvascular ischemia and demyelinating disease" and was told by  neurologist that she "might have MS" 10. Elevated CA-125 - ? Significance  Should this be pursued?  Jamal Maes, MD [facility] [phone] pager 07/31/2013, 10:05 AM

## 2013-08-01 ENCOUNTER — Inpatient Hospital Stay (HOSPITAL_COMMUNITY): Payer: Medicare Other

## 2013-08-01 DIAGNOSIS — G379 Demyelinating disease of central nervous system, unspecified: Secondary | ICD-10-CM

## 2013-08-01 LAB — CBC
HCT: 39.5 % (ref 36.0–46.0)
MCH: 27.7 pg (ref 26.0–34.0)
MCV: 82.1 fL (ref 78.0–100.0)
RBC: 4.81 MIL/uL (ref 3.87–5.11)
RDW: 14.2 % (ref 11.5–15.5)
WBC: 6.4 10*3/uL (ref 4.0–10.5)

## 2013-08-01 LAB — UIFE/LIGHT CHAINS/TP QN, 24-HR UR
Albumin, U: DETECTED
Alpha 1, Urine: DETECTED — AB
Beta, Urine: DETECTED — AB
Free Lambda Lt Chains,Ur: 1.4 mg/dL — ABNORMAL HIGH (ref 0.02–0.67)
Gamma Globulin, Urine: DETECTED — AB

## 2013-08-01 LAB — COMPREHENSIVE METABOLIC PANEL
Albumin: 2.8 g/dL — ABNORMAL LOW (ref 3.5–5.2)
BUN: 58 mg/dL — ABNORMAL HIGH (ref 6–23)
CO2: 26 mEq/L (ref 19–32)
Calcium: 8.4 mg/dL (ref 8.4–10.5)
Chloride: 102 mEq/L (ref 96–112)
Creatinine, Ser: 3.19 mg/dL — ABNORMAL HIGH (ref 0.50–1.10)
GFR calc Af Amer: 20 mL/min — ABNORMAL LOW (ref >90)
GFR calc non Af Amer: 17 mL/min — ABNORMAL LOW (ref >90)
Glucose, Bld: 83 mg/dL (ref 70–99)
Potassium: 5.7 mEq/L — ABNORMAL HIGH (ref 3.5–5.1)
Total Bilirubin: 0.2 mg/dL — ABNORMAL LOW (ref 0.3–1.2)
Total Protein: 6.2 g/dL (ref 6.0–8.3)

## 2013-08-01 LAB — GLUCOSE, CAPILLARY
Glucose-Capillary: 136 mg/dL — ABNORMAL HIGH (ref 70–99)
Glucose-Capillary: 146 mg/dL — ABNORMAL HIGH (ref 70–99)

## 2013-08-01 LAB — PHOSPHORUS: Phosphorus: 5.8 mg/dL — ABNORMAL HIGH (ref 2.3–4.6)

## 2013-08-01 LAB — ANA: Anti Nuclear Antibody(ANA): NEGATIVE

## 2013-08-01 LAB — MPO/PR-3 (ANCA) ANTIBODIES: Serine Protease 3: <1 AU/mL (ref <20)

## 2013-08-01 MED ORDER — SODIUM BICARBONATE 650 MG PO TABS
650.0000 mg | ORAL_TABLET | Freq: Three times a day (TID) | ORAL | Status: DC
  Administered 2013-08-01 – 2013-08-03 (×7): 650 mg via ORAL
  Filled 2013-08-01 (×9): qty 1

## 2013-08-01 MED ORDER — GLYCERIN (LAXATIVE) 2.1 G RE SUPP
1.0000 | Freq: Every day | RECTAL | Status: DC | PRN
  Filled 2013-08-01: qty 1

## 2013-08-01 MED ORDER — SODIUM POLYSTYRENE SULFONATE 15 GM/60ML PO SUSP
30.0000 g | Freq: Once | ORAL | Status: AC
  Administered 2013-08-01: 09:00:00 30 g via ORAL
  Filled 2013-08-01: qty 120

## 2013-08-01 MED ORDER — SODIUM POLYSTYRENE SULFONATE 15 GM/60ML PO SUSP
15.0000 g | ORAL | Status: DC
  Administered 2013-08-02: 15 g via ORAL
  Filled 2013-08-01: qty 60

## 2013-08-01 NOTE — Progress Notes (Signed)
Patient is being transported to Ultrasound for procedure, patient is stable. Ruben Im RN

## 2013-08-01 NOTE — Progress Notes (Signed)
Subjective:  Wondering when she will go home Good diuresis with stable creatinine on po lasix K up again despite K restricted diet Objective Vital signs in last 24 hours: Filed Vitals:   07/31/13 1649 07/31/13 2218 07/31/13 2235 08/01/13 0609  BP: 158/69 166/65 154/61 145/62  Pulse: 62 66  60  Temp: 98.5 F (36.9 C) 98.7 F (37.1 C)  97.8 F (36.6 C)  TempSrc: Oral Oral  Oral  Resp: 18 18  18   Height:      Weight:    96.163 kg (212 lb)  SpO2: 100% 95%  98%   Weight change: -1.361 kg (-3 lb)  Intake/Output Summary (Last 24 hours) at 08/01/13 0749 Last data filed at 08/01/13 0501  Gross per 24 hour  Intake    960 ml  Output   3640 ml  Net  -2680 ml   Physical Exam:  BP 145/62  Pulse 60  Temp(Src) 97.8 F (36.6 C) (Oral)  Resp 18  Ht 4\' 3"  (1.295 m)  Wt 96.163 kg (212 lb)  BMI 57.34 kg/m2  SpO2 98% Looks comfortable No JVD Lungs clear Abdomen soft Liver edge 4 cn below RCM No localized tenderness Stumps with no edema  I/O last 3 completed shifts: In: 1200 [P.O.:1200] Out: 5500 [Urine:5500]   Weight trending: 08/01/13 0609 96.1 kg  07/31/13 0509 97.5 kg   07/30/13 0157 104.9 kg    Recent Labs Lab 07/29/13 1904 07/29/13 2155 07/30/13 0507 07/30/13 1015 07/31/13 0440 08/01/13 0525  NA 138  --  136  --  136 137  K 6.2* 5.9* 5.9* 5.8* 5.3* 5.7*  CL 107  --  105  --  104 102  CO2 19  --  19  --  21 26  GLUCOSE 264*  --  107*  --  98 83  BUN 57*  --  56*  --  55* 58*  CREATININE 3.10*  --  3.06*  --  3.13* 3.19*  CALCIUM 8.0*  --  8.0*  --  8.1* 8.4  PHOS  --   --  5.7*  --  5.5* 5.8*   Liver Function Tests:  Recent Labs Lab 07/29/13 1904 07/31/13 0440 08/01/13 0525  AST 24 16 11   ALT 22 21 15   ALKPHOS 116 110 105  BILITOT 0.2* 0.2* 0.2*  PROT 6.3 6.1 6.2  ALBUMIN 2.8* 2.7* 2.8*    Recent Labs Lab 07/30/13 0610  LIPASE 53    Recent Labs Lab 07/29/13 1904 07/30/13 0507 07/31/13 0440 08/01/13 0525  WBC 7.3 6.6 7.4 6.4  NEUTROABS  5.6 4.6  --   --   HGB 12.8 12.4 12.9 13.3  HCT 38.7 37.5 38.8 39.5  MCV 82.3 81.7 81.5 82.1  PLT 210 220 235 217    Recent Labs Lab 07/31/13 0631 07/31/13 1106 07/31/13 1658 07/31/13 2214 08/01/13 0622  GLUCAP 100* 105* 122* 139* 75   Results for Angela Soto, Angela Soto (MRN [id number]) as of 07/31/2013 10:08  Ref. Range 07/30/2013 06:10  HIV Latest Range: NON REACTIVE  NON REACTIVE   Results for Angela Soto, Angela Soto (MRN [id number]) as of 07/31/2013 10:08  Ref. Range 07/30/2013 06:10  Hep A IgM Latest Range: NON REACTIVE  NON REACTIVE  Hepatitis B Surface Ag Latest Range: NEGATIVE  NEGATIVE  Hep B C IgM Latest Range: NON REACTIVE  NON REACTIVE  HCV Ab Latest Range: NEGATIVE  NEGATIVE   Results for Angela Soto, Angela Soto (MRN [id number]) as of 07/31/2013 10:08  Ref. Range 07/30/2013  14:20  CA 125 Latest Range: 0.0-30.2 U/mL 65.8 (H)   Results for Angela Soto, Angela Soto (MRN [id number]) as of 07/31/2013 10:08  Ref. Range 07/29/2013 23:40  07/30/2013 16:28  PROTEIN CREATININE RATIO Latest Range: 0.00-0.15    2.75 (H)   Results for Angela Soto, Angela Soto (MRN [id number]) as of 07/31/2013 10:08  Ref. Range 07/30/2013 12:28  Monocyte-Macrophage-Serous Fluid Latest Range: 50-90 % 84  Albumin, Fluid No range found 1.8  Fluid Type-FALB No range found ASCITIC  Amylase, Fluid No range found 18  Fluid Type-FAMY No range found ASCITIC  Fluid Type-FLDH No range found ASCITIC  LD, Fluid Latest Range: 3-23 U/L 43 (H)  Total protein, fluid No range found 3.2  Fluid Type-FCT No range found ASCITIC  Fluid Type-FTP No range found ASCITIC  Color, Fluid Latest Range: YELLOW  YELLOW (A)  WBC, Fluid Latest Range: 0-1000 cu mm 125  Lymphs, Fluid No range found 12  Eos, Fluid No range found 0  Appearance, Fluid Latest Range: CLEAR  HAZY (A)  Neutrophil Count, Fluid Latest Range: 0-25 % 4   ANA, ANCA, C3, C4, SPEP, UPEP all pending Studies/Results: US Abdomen Complete  07/30/2013   CLINICAL DATA:  Abdominal pain.  Cholelithiasis. Renal insufficiency. Ascites. Diabetes and hypertension.  EXAM: ULTRASOUND ABDOMEN COMPLETE  COMPARISON:  None.  FINDINGS: Gallbladder  Multiple tiny gallstones are identified. No significant gallbladder wall thickening or pericholecystic fluid noted. No sonographic Murphy sign noted.  Common bile duct  Diameter: 6 mm  Liver  3 cm parenchymal calcification noted in the peripheral right hepatic lobe. No liver mass identified. Coarse echotexture and mild nodularity capsular contour is consistent with hepatic cirrhosis. Mild ascites also noted.  IVC  No abnormality visualized.  Pancreas  Visualized portion unremarkable.  Spleen  Mild splenomegaly, with length measuring 14 cm and estimated volume of 827 cc.  Right Kidney  Length: 13.8 cm. Diffusely increased parenchymal echogenicity. No evidence of renal mass or hydronephrosis.  Left Kidney  Length: 13.6 cm. Diffusely increased parenchymal echogenicity. No evidence of renal mass or hydronephrosis.  Abdominal aorta  No aneurysm visualized.  IMPRESSION: Cholelithiasis. No sonographic signs of acute cholecystitis or biliary dilatation.  Hepatic cirrhosis. No evidence of hepatic neoplasm.  Mild ascites and splenomegaly, consistent with portal venous hypertension.  Nonspecific medical renal disease. No evidence of hydronephrosis.   Electronically Signed   By: Earle Gell M.D.   On: 07/30/2013 13:39   US Paracentesis  07/30/2013   CLINICAL DATA:  Abdominal pain, acute on chronic kidney disease, ascites. Request is made for diagnostic and therapeutic paracentesis.  EXAM: ULTRASOUND GUIDED DIAGNOSTIC AND THERAPEUTIC  PARACENTESIS  COMPARISON:  None.  PROCEDURE: An ultrasound guided paracentesis was thoroughly discussed with the patient and questions answered. The benefits, risks, alternatives and complications were also discussed. The patient understands and wishes to proceed with the procedure. Written consent was obtained.  Ultrasound was performed to  localize and mark an adequate pocket of fluid in the left lower. quadrant of the abdomen. The area was then prepped and draped in the normal sterile fashion. 1% Lidocaine was used for local anesthesia. Under ultrasound guidance a 19 gauge Yueh catheter was introduced. Paracentesis was performed. The catheter was removed and a dressing applied.  COMPLICATIONS: None.  FINDINGS: A total of approximately 4 liters of yellow. fluid was removed. A fluid sample was sent for laboratory analysis.  IMPRESSION: Successful ultrasound guided diagnostic and therapeutic paracentesis yielding 4 liters of ascites.  Read by: Rowe Robert ,P.A.-C.  Electronically Signed   By: Jacqulynn Cadet M.D.   On: 07/30/2013 13:35   Medications:   . amLODipine  10 mg Oral Daily  . atorvastatin  80 mg Oral QHS  . calcium acetate  667 mg Oral TID WC  . cloNIDine  0.2 mg Oral TID  . clopidogrel  75 mg Oral Daily  . furosemide  80 mg Oral BID  . hydrALAZINE  25 mg Oral TID  . insulin aspart  0-9 Units Subcutaneous TID WC  . insulin glargine  30 Units Subcutaneous QHS  . metoprolol  200 mg Oral BID  . polyvinyl alcohol  2 drop Both Eyes Q4H  . sodium bicarbonate  1,300 mg Oral TID  . sodium chloride  3 mL Intravenous Q12H  . sodium chloride  3 mL Intravenous Q12H  . terazosin  5 mg Oral BID   I  have reviewed scheduled and prn medications.  Impression/Recommendations  42 y.o. year-old WF with longstanding DM, HTN, bilateral BKA's, CAD with H/O MI and stents, with known CKD (creatine 2.3 in 02/2013) who presents with abdominal pain, fairly abrupt onset of ascites and worsening renal function   1. CKD4 vs AKI on CKD - Most likely diabetic nephropathy given duration of DM, but states no diabetic retinopathy; suspect this is just progression of diabetic nephropathy; full proteinuria workup in progress. 2.45 gm protein on UPC.  Hepatitis serologies negative;   SPEP, UPEP, complements, ANA, ANCA all still pending. Saving left arm.  Changing to po lasix today. States has f/u already scheduled with her nephrologist on 10/29 2. CKD-MBD - started phoslo 1 tid with meals. PTH 86.1 - no rx needed until >100 3. Hyperkalemia - K back up despite restricted diet, corrected acidosis, loop diuretics.  Repeat kayexelate today.  May need small dose several times/week - ie QOD 15 gm  4. Metabolic acidosis with normal gap - likely RTA from DM. On oral bicarb. 5. Ascites with cirrhosis,splenomegaly, changes of portal hypertension.  Cytologies pending.  Other studies as above. GI evaluating 6. HTN - meds/diuretics. No ACE or ARB (contraindicated due to issues with hyperkalemia)  7. CAD with h/o stents - asympt 8. Bilat BKA's  9. Visual disturbance - needs additional evaluation; seen by neuro in August. MRI at that time "extensive abnormality throughout the white matter, brainstem and medulla. Differential diagnosis includes advanced chronic microvascular ischemia and demyelinating disease" and was told by neurologist that she "might have MS" 10. Elevated CA-125 - ? Significance  Should this be pursued?  Jamal Maes, MD [facility] [phone] pager 08/01/2013, 7:49 AM

## 2013-08-01 NOTE — Care Management Note (Signed)
    Page 1 of 1   08/01/2013     2:53:54 PM   CARE MANAGEMENT NOTE 08/01/2013  Patient:  Angela Soto, Angela Soto   Account Number:  [id number]  Date Initiated:  08/01/2013  Documentation initiated by:  Llana Aliment  Subjective/Objective Assessment:   42yo female admitted with Abdominal pain.  Pt. lives with spouse at home.     Action/Plan:   discharge planning   Anticipated DC Date:  08/03/2013   Anticipated DC Plan:  [place]  CM consult      Choice offered to / List presented to:             Status of service:  In process, will continue to follow Medicare Important Message given?   (If response is "NO", the following Medicare IM given date fields will be blank) Date Medicare IM given:   Date Additional Medicare IM given:    Discharge Disposition:    Per UR Regulation:  Reviewed for med. necessity/level of care/duration of stay  If discussed at [place] of Stay Meetings, dates discussed:    Comments:  08/01/13 1315 In to speak with pt. about obtaining a different PCP.  Pt. gave permission for this NCM to call [facility] to look for PCP accepting new patients from [place].  Pt. lives at home with spouse. Llana Aliment, RN, BSN NCM [phone]

## 2013-08-01 NOTE — Consult Note (Addendum)
NEURO HOSPITALIST CONSULT NOTE    Reason for Consult: visual disturbances   HPI:                                                                                                                                          Angela Soto is an 42 y.o. female who states 9 months ago she awoke and noted a film over her visual fields. The film is like looking through "cellophane" and is worse when looking into the distance.  She was brought to hospital in [place] where she was worked up for a stroke -per patient- and there was nothing abnormal found.  For the past three months she has been followed by Dr. Eldridge Scot) and has seen him for 3-4 office visits.  Per patient, the last office visit she had, he went over her MRI findings and could not explain her visual issues.  She also states he has diagnosed her with MS and [place] (obtaining his notes as patient is unsure what exactly was obtained).  Patient states she has only had one MRI and no LP (looking into orders--he did order one to be done under fluoroscopy but patient states her wheel chair broke and could not make appointment). She was then sent to a opthalmology who stated he could not find a course of her visual problem.  The blurred vision is in all of her visual fields, has not progressively worsened.  She feels she has "darker, blurred vision" at all times but the sunlight will hurt her eyes.  Neurology was asked to evaluate while in hospital.   Past Medical History  Diagnosis Date  . Diabetes mellitus without complication   . Coronary artery disease   . Hypertension     Past Surgical History  Procedure Laterality Date  . Ble amputation bk    . Abdominal surgery    . Cardiac stents    . Cardiac catheterization    . Coronary angioplasty      Family History  Problem Relation Age of Onset  . Diabetes Mellitus II Mother   . Ovarian cancer Mother   . CAD Father   . Diabetes Mellitus II Father      Social  History:  reports that she has been smoking.  She does not have any smokeless tobacco history on file. She reports that she does not drink alcohol or use illicit drugs.  Allergies  Allergen Reactions  . Metformin And Related Nausea And Vomiting    MEDICATIONS:  Prior to Admission:  Prescriptions prior to admission  Medication Sig Dispense Refill  . Acetaminophen-Aspirin Buffered (EXCEDRIN BACK & BODY) 250-250 MG tablet Take 2 tablets by mouth every 4 (four) hours as needed for fever.      Marland Kitchen amLODipine (NORVASC) 10 MG tablet Take 10 mg by mouth daily.      Marland Kitchen atorvastatin (LIPITOR) 80 MG tablet Take 80 mg by mouth at bedtime.      . Carboxymethylcellulose Sodium (THERATEARS) 0.25 % SOLN Apply 2 drops to eye every 4 (four) hours.      . cloNIDine (CATAPRES) 0.2 MG tablet Take 0.2 mg by mouth 2 (two) times daily.      . clopidogrel (PLAVIX) 75 MG tablet Take 75 mg by mouth daily.      . ergocalciferol (VITAMIN D2) 50000 UNITS capsule Take 50,000 Units by mouth once a week. fridays      . FOLIC ACID PO Take 1 tablet by mouth daily.      . furosemide (LASIX) 40 MG tablet Take 40 mg by mouth daily.      . hydrALAZINE (APRESOLINE) 25 MG tablet Take 25 mg by mouth 3 (three) times daily.      Marland Kitchen HYDROcodone-acetaminophen (NORCO/VICODIN) 5-325 MG per tablet Take 1 tablet by mouth every 6 (six) hours as needed for pain.      Marland Kitchen insulin aspart (NOVOLOG) 100 UNIT/ML injection Inject 14 Units into the skin 3 (three) times daily with meals.      . insulin glargine (LANTUS) 100 UNIT/ML injection Inject 60 Units into the skin at bedtime.      . metoprolol (TOPROL-XL) 200 MG 24 hr tablet Take 200 mg by mouth 2 (two) times daily.      Marland Kitchen terazosin (HYTRIN) 5 MG capsule Take 5 mg by mouth 2 (two) times daily.       Scheduled: . amLODipine  10 mg Oral Daily  . atorvastatin  80 mg Oral QHS  .  calcium acetate  667 mg Oral TID WC  . cloNIDine  0.2 mg Oral TID  . clopidogrel  75 mg Oral Daily  . furosemide  80 mg Oral BID  . hydrALAZINE  25 mg Oral TID  . insulin aspart  0-9 Units Subcutaneous TID WC  . insulin glargine  30 Units Subcutaneous QHS  . metoprolol  200 mg Oral BID  . polyvinyl alcohol  2 drop Both Eyes Q4H  . sodium bicarbonate  650 mg Oral TID  . sodium chloride  3 mL Intravenous Q12H  . [START ON 08/02/2013] sodium polystyrene  15 g Oral QODAY  . terazosin  5 mg Oral BID     ROS:                                                                                                                                       History obtained from the patient  General  ROS: negative for - chills, fatigue, fever, night sweats, weight gain or weight loss Psychological ROS: negative for - behavioral disorder, hallucinations, memory difficulties, mood swings or suicidal ideation Ophthalmic ROS: negative for - blurry vision, double vision, eye pain or loss of vision ENT ROS: negative for - epistaxis, nasal discharge, oral lesions, sore throat, tinnitus or vertigo Allergy and Immunology ROS: negative for - hives or itchy/watery eyes Hematological and Lymphatic ROS: negative for - bleeding problems, bruising or swollen lymph nodes Endocrine ROS: negative for - galactorrhea, hair pattern changes, polydipsia/polyuria or temperature intolerance Respiratory ROS: negative for - cough, hemoptysis, shortness of breath or wheezing Cardiovascular ROS: negative for - chest pain, dyspnea on exertion, edema or irregular heartbeat Gastrointestinal ROS: negative for - abdominal pain, diarrhea, hematemesis, nausea/vomiting or stool incontinence Genito-Urinary ROS: negative for - dysuria, hematuria, incontinence or urinary frequency/urgency Musculoskeletal ROS: negative for - joint swelling or muscular weakness Neurological ROS: as noted in HPI Dermatological ROS: negative for rash and skin lesion  changes   Blood pressure 136/82, pulse 64, temperature 97.8 F (36.6 C), temperature source Oral, resp. rate 18, height 4\' 3"  (1.295 m), weight 96.163 kg (212 lb), SpO2 99.00%.   Neurologic Examination:                                                                                                      Mental Status: Alert, oriented, thought content appropriate.  Speech fluent without evidence of aphasia.  Able to follow 3 step commands without difficulty. Cranial Nerves: II: Discs flat bilaterally; Visual fields grossly normal, pupils equal, round, reactive to light and accommodation III,IV, VI: ptosis not present, extra-ocular motions intact bilaterally V,VII: smile symmetric, facial light touch sensation normal bilaterally VIII: hearing normal bilaterally IX,X: gag reflex present XI: bilateral shoulder shrug XII: midline tongue extension Motor: Right : Upper extremity   5/5    Left:     Upper extremity   5/5  Lower extremity   5/5 BKA    Lower extremity   5/5 BKA Tone and bulk:normal tone throughout; no atrophy noted Sensory: Pinprick and light touch intact throughout, bilaterally Deep Tendon Reflexes:  Right: Upper Extremity   Left: Upper extremity   biceps (C-5 to C-6) 2/4   biceps (C-5 to C-6) 2/4 tricep (C7) 2/4    triceps (C7) 2/4 Brachioradialis (C6) 2/4  Brachioradialis (C6) 2/4  Lower Extremity    Lower Extremity  quadriceps (L-2 to L-4) 0/4   quadriceps (L-2 to L-4) 20/4   Plantars: Bilateral BKA Cerebellar: normal finger-to-nose,   CV: pulses palpable throughout    No results found for this basename: cbc, bmp, coags, chol, tri, ldl, hga1c    Results for orders placed during the hospital encounter of 07/29/13 (from the past 48 hour(s))  BODY FLUID CELL COUNT WITH DIFFERENTIAL     Status: Abnormal   Collection Time    07/30/13 12:28 PM      Result Value Range   Fluid Type-FCT ASCITIC     Comment: ABDOMEN     FLUID     CORRECTED ON  10/14 AT 1318:  PREVIOUSLY REPORTED AS ASCITIC   Color, Fluid YELLOW (*) YELLOW   Appearance, Fluid HAZY (*) CLEAR   WBC, Fluid 125  0 - 1000 cu mm   Neutrophil Count, Fluid 4  0 - 25 %   Lymphs, Fluid 12     Monocyte-Macrophage-Serous Fluid 84  50 - 90 %   Eos, Fluid 0    ALBUMIN, FLUID     Status: None   Collection Time    07/30/13 12:28 PM      Result Value Range   Albumin, Fluid 1.8     Comment: NO NORMAL RANGE ESTABLISHED FOR THIS TEST   Fluid Type-FALB ASCITIC     Comment: ABDOMEN     FLUID     CORRECTED ON 10/14 AT 1316: PREVIOUSLY REPORTED AS ASCITIC  BODY FLUID CULTURE     Status: None   Collection Time    07/30/13 12:28 PM      Result Value Range   Specimen Description ASCITIC ABDOMEN FLUID     Special Requests 60ML FLUID     Gram Stain       Value: FEW WBC PRESENT,BOTH PMN AND MONONUCLEAR     NO ORGANISMS SEEN     Performed at [facility]   Culture       Value: NO GROWTH 1 DAY     Performed at [facility]   Report Status PENDING    LACTATE DEHYDROGENASE, BODY FLUID     Status: Abnormal   Collection Time    07/30/13 12:28 PM      Result Value Range   LD, Fluid 43 (*) 3 - 23 U/L   Fluid Type-FLDH ASCITIC     Comment: ABDOMEN     FLUID     CORRECTED ON 10/14 AT 1317: PREVIOUSLY REPORTED AS ASCITIC  PROTEIN, BODY FLUID     Status: None   Collection Time    07/30/13 12:28 PM      Result Value Range   Total protein, fluid 3.2     Comment: NO NORMAL RANGE ESTABLISHED FOR THIS TEST   Fluid Type-FTP ASCITIC     Comment: ABDOMEN     FLUID     CORRECTED ON 10/14 AT 1320: PREVIOUSLY REPORTED AS ASCITIC  AMYLASE, BODY FLUID     Status: None   Collection Time    07/30/13 12:28 PM      Result Value Range   Amylase, Fluid 18     Comment: NO NORMAL RANGE ESTABLISHED FOR THIS TEST   Fluid Type-FAMY ASCITIC     Comment: ABDOMEN     FLUID     CORRECTED ON 10/14 AT 1325: PREVIOUSLY REPORTED AS ASCITIC  CA 125     Status: Abnormal   Collection Time    07/30/13   2:20 PM      Result Value Range   CA 125 65.8 (*) 0.0 - 30.2 U/mL   Comment: Performed at [facility]  AFP TUMOR MARKER     Status: None   Collection Time    07/30/13  2:20 PM      Result Value Range   AFP-Tumor Marker <1.3  0.0 - 8.0 ng/mL   Comment: (NOTE)     The Advia Centaur AFP immunoassay method is used.  Results obtained     with different assay methods or kits cannot be used interchangeably.     AFP is a valuable aid in the management of nonseminomatous testicular  cancer patients when used in conjunction with information available     from the clinical evaluation and other diagnostic procedures.     Increased serum AFP concentrations have also been observed in ataxia     telangiectasia, hereditary tyrosinemia, primary hepatocellular     carcinoma, teratocarcinoma, gastrointestinal tract cancers with and     without liver metastases, and in benign hepatic conditions such as     acute viral hepatitis, chronic active hepatitis, and cirrhosis.  This     result cannot be interpreted as absolute evidence of the presence or     absence of malignant disease.  This result is not interpretable in     pregnant females.     Performed at [place]     Status: None   Collection Time    07/30/13  2:20 PM      Result Value Range   Prothrombin Time 13.1  11.6 - 15.2 seconds   INR 1.01  0.00 - 1.49  GLUCOSE, CAPILLARY     Status: Abnormal   Collection Time    07/30/13  4:23 PM      Result Value Range   Glucose-Capillary 181 (*) 70 - 99 mg/dL  IMMUNOFIXATION ELECTROPHORESIS, URINE (WITH TOT PROT)     Status: Abnormal   Collection Time    07/30/13  4:28 PM      Result Value Range   Time RANDOM     Comment: CORRECTED ON 10/15 AT 1420: PREVIOUSLY REPORTED AS URINE, RANDOM, CORRECTED ON 10/14 AT 1644: PREVIOUSLY REPORTED AS 24   Volume, Urine RANDOM     Comment: CORRECTED ON 10/15 AT 1420: PREVIOUSLY REPORTED AS URINE, RANDOM   Total Protein, Urine  30.5     Comment: No established reference range.   Total Protein, Urine-Ur/day NOT CALC  10 - 140 mg/day   Comment: (NOTE)     Total urinary protein is determined by adding the albumin and Kappa     and/or Lambda light chains.  This value may not agree with the total     protein as determined by chemical methods, which characteristically     underestimate urinary light chains.   Albumin, U PENDING     Alpha 1, Urine PENDING     Alpha 2, Urine PENDING     Beta, Urine PENDING     Gamma Globulin, Urine PENDING     Free Kappa Lt Chains,Ur 7.98 (*) 0.14 - 2.42 mg/dL   Free Lt Chn Excr Rate NOT CALC     Free Lambda Lt Chains,Ur 1.40 (*) 0.02 - 0.67 mg/dL   Free Lambda Excretion/Day NOT CALC     Free Kappa/Lambda Ratio 5.70  2.04 - 10.37 ratio   Comment: (NOTE)     Performed at [facility]   Immunofixation, Urine PENDING    PROTEIN / CREATININE RATIO, URINE     Status: Abnormal   Collection Time    07/30/13  4:28 PM      Result Value Range   Creatinine, Urine 13.07     Total Protein, Urine 36     Comment: NO NORMAL RANGE ESTABLISHED FOR THIS TEST   PROTEIN CREATININE RATIO 2.75 (*) 0.00 - 0.15  GLUCOSE, CAPILLARY     Status: Abnormal   Collection Time    07/30/13  9:06 PM      Result Value Range   Glucose-Capillary 153 (*) 70 - 99 mg/dL  CBC     Status: None  Collection Time    07/31/13  4:40 AM      Result Value Range   WBC 7.4  4.0 - 10.5 K/uL   RBC 4.76  3.87 - 5.11 MIL/uL   Hemoglobin 12.9  12.0 - 15.0 g/dL   HCT 38.8  36.0 - 46.0 %   MCV 81.5  78.0 - 100.0 fL   MCH 27.1  26.0 - 34.0 pg   MCHC 33.2  30.0 - 36.0 g/dL   RDW 14.2  11.5 - 15.5 %   Platelets 235  150 - 400 K/uL  COMPREHENSIVE METABOLIC PANEL     Status: Abnormal   Collection Time    07/31/13  4:40 AM      Result Value Range   Sodium 136  135 - 145 mEq/L   Potassium 5.3 (*) 3.5 - 5.1 mEq/L   Chloride 104  96 - 112 mEq/L   CO2 21  19 - 32 mEq/L   Glucose, Bld 98  70 - 99 mg/dL   BUN 55 (*) 6 -  23 mg/dL   Creatinine, Ser 3.13 (*) 0.50 - 1.10 mg/dL   Calcium 8.1 (*) 8.4 - 10.5 mg/dL   Total Protein 6.1  6.0 - 8.3 g/dL   Albumin 2.7 (*) 3.5 - 5.2 g/dL   AST 16  0 - 37 U/L   ALT 21  0 - 35 U/L   Alkaline Phosphatase 110  39 - 117 U/L   Total Bilirubin 0.2 (*) 0.3 - 1.2 mg/dL   GFR calc non Af Amer 17 (*) >90 mL/min   GFR calc Af Amer 20 (*) >90 mL/min   Comment: (NOTE)     The eGFR has been calculated using the CKD EPI equation.     This calculation has not been validated in all clinical situations.     eGFR's persistently <90 mL/min signify possible Chronic Kidney     Disease.  ANA     Status: None   Collection Time    07/31/13  4:40 AM      Result Value Range   ANA NEGATIVE  NEGATIVE   Comment: Performed at [place] (REFLEX)     Status: None   Collection Time    07/31/13  4:40 AM      Result Value Range   HCV Ab NEGATIVE  NEGATIVE   Comment: Performed at [place], INTACT (NO CA)     Status: Abnormal   Collection Time    07/31/13  4:40 AM      Result Value Range   PTH 86.1 (*) 14.0 - 72.0 pg/mL   Comment: Performed at [place]     Status: Abnormal   Collection Time    07/31/13  4:40 AM      Result Value Range   Phosphorus 5.5 (*) 2.3 - 4.6 mg/dL  IRON AND TIBC     Status: Abnormal   Collection Time    07/31/13  4:40 AM      Result Value Range   Iron 28 (*) 42 - 135 ug/dL   TIBC 160 (*) 250 - 470 ug/dL   Saturation Ratios 18 (*) 20 - 55 %   UIBC 132  125 - 400 ug/dL   Comment: Performed at [place]     Status: None   Collection Time    07/31/13  4:40 AM      Result Value Range  Ferritin 26  10 - 291 ng/mL   Comment: Performed at [place], CAPILLARY     Status: Abnormal   Collection Time    07/31/13  6:31 AM      Result Value Range   Glucose-Capillary 100 (*) 70 - 99 mg/dL  GLUCOSE, CAPILLARY     Status: Abnormal    Collection Time    07/31/13 11:06 AM      Result Value Range   Glucose-Capillary 105 (*) 70 - 99 mg/dL  GLUCOSE, CAPILLARY     Status: Abnormal   Collection Time    07/31/13  4:58 PM      Result Value Range   Glucose-Capillary 122 (*) 70 - 99 mg/dL  GLUCOSE, CAPILLARY     Status: Abnormal   Collection Time    07/31/13 10:14 PM      Result Value Range   Glucose-Capillary 139 (*) 70 - 99 mg/dL  CBC     Status: None   Collection Time    08/01/13  5:25 AM      Result Value Range   WBC 6.4  4.0 - 10.5 K/uL   RBC 4.81  3.87 - 5.11 MIL/uL   Hemoglobin 13.3  12.0 - 15.0 g/dL   HCT 39.5  36.0 - 46.0 %   MCV 82.1  78.0 - 100.0 fL   MCH 27.7  26.0 - 34.0 pg   MCHC 33.7  30.0 - 36.0 g/dL   RDW 14.2  11.5 - 15.5 %   Platelets 217  150 - 400 K/uL  COMPREHENSIVE METABOLIC PANEL     Status: Abnormal   Collection Time    08/01/13  5:25 AM      Result Value Range   Sodium 137  135 - 145 mEq/L   Potassium 5.7 (*) 3.5 - 5.1 mEq/L   Chloride 102  96 - 112 mEq/L   CO2 26  19 - 32 mEq/L   Glucose, Bld 83  70 - 99 mg/dL   BUN 58 (*) 6 - 23 mg/dL   Creatinine, Ser 3.19 (*) 0.50 - 1.10 mg/dL   Calcium 8.4  8.4 - 10.5 mg/dL   Total Protein 6.2  6.0 - 8.3 g/dL   Albumin 2.8 (*) 3.5 - 5.2 g/dL   AST 11  0 - 37 U/L   ALT 15  0 - 35 U/L   Alkaline Phosphatase 105  39 - 117 U/L   Total Bilirubin 0.2 (*) 0.3 - 1.2 mg/dL   GFR calc non Af Amer 17 (*) >90 mL/min   GFR calc Af Amer 20 (*) >90 mL/min   Comment: (NOTE)     The eGFR has been calculated using the CKD EPI equation.     This calculation has not been validated in all clinical situations.     eGFR's persistently <90 mL/min signify possible Chronic Kidney     Disease.  PHOSPHORUS     Status: Abnormal   Collection Time    08/01/13  5:25 AM      Result Value Range   Phosphorus 5.8 (*) 2.3 - 4.6 mg/dL  GLUCOSE, CAPILLARY     Status: None   Collection Time    08/01/13  6:22 AM      Result Value Range   Glucose-Capillary 75  70 - 99 mg/dL   GLUCOSE, CAPILLARY     Status: Abnormal   Collection Time    08/01/13 11:13 AM      Result Value  Range   Glucose-Capillary 136 (*) 70 - 99 mg/dL    US Abdomen Complete  07/30/2013   CLINICAL DATA:  Abdominal pain. Cholelithiasis. Renal insufficiency. Ascites. Diabetes and hypertension.  EXAM: ULTRASOUND ABDOMEN COMPLETE  COMPARISON:  None.  FINDINGS: Gallbladder  Multiple tiny gallstones are identified. No significant gallbladder wall thickening or pericholecystic fluid noted. No sonographic Murphy sign noted.  Common bile duct  Diameter: 6 mm  Liver  3 cm parenchymal calcification noted in the peripheral right hepatic lobe. No liver mass identified. Coarse echotexture and mild nodularity capsular contour is consistent with hepatic cirrhosis. Mild ascites also noted.  IVC  No abnormality visualized.  Pancreas  Visualized portion unremarkable.  Spleen  Mild splenomegaly, with length measuring 14 cm and estimated volume of 827 cc.  Right Kidney  Length: 13.8 cm. Diffusely increased parenchymal echogenicity. No evidence of renal mass or hydronephrosis.  Left Kidney  Length: 13.6 cm. Diffusely increased parenchymal echogenicity. No evidence of renal mass or hydronephrosis.  Abdominal aorta  No aneurysm visualized.  IMPRESSION: Cholelithiasis. No sonographic signs of acute cholecystitis or biliary dilatation.  Hepatic cirrhosis. No evidence of hepatic neoplasm.  Mild ascites and splenomegaly, consistent with portal venous hypertension.  Nonspecific medical renal disease. No evidence of hydronephrosis.   Electronically Signed   By: Earle Gell M.D.   On: 07/30/2013 13:39   US Paracentesis  07/30/2013   CLINICAL DATA:  Abdominal pain, acute on chronic kidney disease, ascites. Request is made for diagnostic and therapeutic paracentesis.  EXAM: ULTRASOUND GUIDED DIAGNOSTIC AND THERAPEUTIC  PARACENTESIS  COMPARISON:  None.  PROCEDURE: An ultrasound guided paracentesis was thoroughly discussed with the patient  and questions answered. The benefits, risks, alternatives and complications were also discussed. The patient understands and wishes to proceed with the procedure. Written consent was obtained.  Ultrasound was performed to localize and mark an adequate pocket of fluid in the left lower. quadrant of the abdomen. The area was then prepped and draped in the normal sterile fashion. 1% Lidocaine was used for local anesthesia. Under ultrasound guidance a 19 gauge Yueh catheter was introduced. Paracentesis was performed. The catheter was removed and a dressing applied.  COMPLICATIONS: None.  FINDINGS: A total of approximately 4 liters of yellow. fluid was removed. A fluid sample was sent for laboratory analysis.  IMPRESSION: Successful ultrasound guided diagnostic and therapeutic paracentesis yielding 4 liters of ascites.  Read by: Rowe Robert ,P.A.-C.   Electronically Signed   By: Jacqulynn Cadet M.D.   On: 07/30/2013 13:35   MRI brain for evaluation of optic pathways on 06/10/13  *RADIOLOGY REPORT*  Clinical Data: Injury to unspecified optic nerve and pathways  MRI HEAD WITHOUT CONTRAST  Technique: Multiplanar, multiecho pulse sequences of the brain and  surrounding structures were obtained according to standard protocol  without intravenous contrast.  Comparison: None.  Findings: Ventricle size is normal. Negative for Chiari  malformation. Pituitary gland is normal in size.  Numerous periventricular deep white matter hyperintensities  throughout the cerebral hemispheres bilaterally. This is most  prominent in the temporal parietal white matter. Small focal  hyperintensity right medial thalamus. There is hyperintensity in  the pons, left greater than right. There is a chronic infarct in  the right cerebellum. There is hyperintensity in the medulla  bilaterally.  Diffusion weighted imaging is negative for acute infarct. Negative  for hemorrhage or mass. No midline shift. The optic chiasm and  optic  nerves are normal in signal and caliber.  Mastoid sinus effusion  on the right. Mild mucosal edema in the  maxillary sinus bilaterally.  IMPRESSION:  Extensive abnormality throughout the white matter, brainstem and  medulla. Differential diagnosis includes advanced chronic  microvascular ischemia and demyelinating disease. Correlation with  symptoms and risk factors for cerebral vascular disease is  suggested.   Assessment/Plan:  42 YO female with 9 months of "blurred vision in bilateral visual fields".  Patient has had significant work up by neurology Dr. Merlene Laughter as out patient (obtaining his records) with no etiology found per patient. MRI obtained on 8/14 does show Numerous periventricular deep white matter hyperintensities throughout the cerebral hemispheres bilaterally" and MS was in the differential. Patient was to get LP but missed her appointment.  I have spoken to patient and she is agreeable to undergo LP while in hospital to further evaluate for MS.  Recommend: 1) LP by IR for (protein, glucose, cell ct/diff, oligoclonal bands, IgG index) --ordered  Will continue to follow.   Assessment and plan discussed with with Dr,. Eliseo Squires and  attending physician.    Etta Quill PA-C [facility] [phone]  I personally participated in this patient's evaluation and management, including formulating the above clinical assessment and management recommendations.  Rush Farmer M.D. [facility] [phone]  08/01/2013, 11:45 AM

## 2013-08-01 NOTE — Progress Notes (Signed)
[facility] PROGRESS NOTE  Angela Soto [id number] DOB: 30-Jul-1971 DOA: 07/29/2013 PCP: Rosita Fire, MD  Assessment/Plan: Hyperkalemia -no BM since admission despite Kayexelate -give suppository  Abdominal pain with diffuse ascites: Patient denies drinking any alcohol, denies any history of hepatitis  - CT abdomen showed ascites with splenomegaly, gallstones anasarca ? Cirrhosis or hepatitis  -Acute hepatitis panel neg. LFTs are normal. lipase ok -Patient had had about 4 L ascitic fluid removed, await further studies  -Dr Anselm Pancoast reviewed the abdominal ultrasound done today, states that the part of the portal vein seen in the exam is patent. Abdominal ultrasound shows multiple tiny gallstones, no cholecystitis. Coarse echotexture and mild nodularity capsular contour is consistent with hepatic cirrhosis. mild splenomegaly.  -GI following- r/o portal vein thrombosis    acute on chronic kidney disease with hyperphosphatemia, hyperkalemia:  -Most likely diabetic nephropathy given duration of DM -renal failure slightly worse this AM -daily labs -SPEP, UPEP, complements, ANA, ANCA all still pending  DM -SSI  Elevated CA-125 -suspect related to NASH but will pursue further work up via ob gyn as outpatient  ?visual disturbance -has seen opthalmology in last 4 months- per patient, no issues with eyes -MRI in 8/14 shows diffuse white matter changes- ask neuro to see   Code Status: full Family Communication: patient at bedside Disposition Plan:    Consultants:  GI  nephro  Procedures:  paracentesis  Antibiotics:    HPI/Subjective: Asking to go home  Objective: Filed Vitals:   08/01/13 0609  BP: 145/62  Pulse: 60  Temp: 97.8 F (36.6 C)  Resp: 18    Intake/Output Summary (Last 24 hours) at 08/01/13 0817 Last data filed at 08/01/13 0501  Gross per 24 hour  Intake    960 ml  Output   3640 ml  Net  -2680 ml   Filed Weights   07/30/13 0157  07/31/13 0509 08/01/13 0609  Weight: 104.9 kg (231 lb 4.2 oz) 97.523 kg (215 lb) 96.163 kg (212 lb)    Exam:   General:  A+Ox3, NAD  Cardiovascular: rrr  Respiratory: clear anterior  Abdomen: +BS, soft, NT  Skin: multiple tattos  Data Reviewed: Basic Metabolic Panel:  Recent Labs Lab 07/29/13 1904 07/29/13 2155 07/30/13 0507 07/30/13 1015 07/31/13 0440 08/01/13 0525  NA 138  --  136  --  136 137  K 6.2* 5.9* 5.9* 5.8* 5.3* 5.7*  CL 107  --  105  --  104 102  CO2 19  --  19  --  21 26  GLUCOSE 264*  --  107*  --  98 83  BUN 57*  --  56*  --  55* 58*  CREATININE 3.10*  --  3.06*  --  3.13* 3.19*  CALCIUM 8.0*  --  8.0*  --  8.1* 8.4  PHOS  --   --  5.7*  --  5.5* 5.8*   Liver Function Tests:  Recent Labs Lab 07/29/13 1904 07/31/13 0440 08/01/13 0525  AST 24 16 11   ALT 22 21 15   ALKPHOS 116 110 105  BILITOT 0.2* 0.2* 0.2*  PROT 6.3 6.1 6.2  ALBUMIN 2.8* 2.7* 2.8*    Recent Labs Lab 07/30/13 0610  LIPASE 53   No results found for this basename: AMMONIA,  in the last 168 hours CBC:  Recent Labs Lab 07/29/13 1904 07/30/13 0507 07/31/13 0440 08/01/13 0525  WBC 7.3 6.6 7.4 6.4  NEUTROABS 5.6 4.6  --   --   HGB 12.8 12.4  12.9 13.3  HCT 38.7 37.5 38.8 39.5  MCV 82.3 81.7 81.5 82.1  PLT 210 220 235 217   Cardiac Enzymes: No results found for this basename: CKTOTAL, CKMB, CKMBINDEX, TROPONINI,  in the last 168 hours BNP (last 3 results) No results found for this basename: PROBNP,  in the last 8760 hours CBG:  Recent Labs Lab 07/31/13 0631 07/31/13 1106 07/31/13 1658 07/31/13 2214 08/01/13 0622  GLUCAP 100* 105* 122* 139* 75    Recent Results (from the past 240 hour(s))  BODY FLUID CULTURE     Status: None   Collection Time    07/30/13 12:28 PM      Result Value Range Status   Specimen Description ASCITIC ABDOMEN FLUID   Final   Special Requests 60ML FLUID   Final   Gram Stain     Final   Value: FEW WBC PRESENT,BOTH PMN AND  MONONUCLEAR     NO ORGANISMS SEEN     Performed at [facility]   Culture     Final   Value: NO GROWTH 1 DAY     Performed at [facility]   Report Status PENDING   Incomplete     Studies: US Abdomen Complete  07/30/2013   CLINICAL DATA:  Abdominal pain. Cholelithiasis. Renal insufficiency. Ascites. Diabetes and hypertension.  EXAM: ULTRASOUND ABDOMEN COMPLETE  COMPARISON:  None.  FINDINGS: Gallbladder  Multiple tiny gallstones are identified. No significant gallbladder wall thickening or pericholecystic fluid noted. No sonographic Murphy sign noted.  Common bile duct  Diameter: 6 mm  Liver  3 cm parenchymal calcification noted in the peripheral right hepatic lobe. No liver mass identified. Coarse echotexture and mild nodularity capsular contour is consistent with hepatic cirrhosis. Mild ascites also noted.  IVC  No abnormality visualized.  Pancreas  Visualized portion unremarkable.  Spleen  Mild splenomegaly, with length measuring 14 cm and estimated volume of 827 cc.  Right Kidney  Length: 13.8 cm. Diffusely increased parenchymal echogenicity. No evidence of renal mass or hydronephrosis.  Left Kidney  Length: 13.6 cm. Diffusely increased parenchymal echogenicity. No evidence of renal mass or hydronephrosis.  Abdominal aorta  No aneurysm visualized.  IMPRESSION: Cholelithiasis. No sonographic signs of acute cholecystitis or biliary dilatation.  Hepatic cirrhosis. No evidence of hepatic neoplasm.  Mild ascites and splenomegaly, consistent with portal venous hypertension.  Nonspecific medical renal disease. No evidence of hydronephrosis.   Electronically Signed   By: Earle Gell M.D.   On: 07/30/2013 13:39   US Paracentesis  07/30/2013   CLINICAL DATA:  Abdominal pain, acute on chronic kidney disease, ascites. Request is made for diagnostic and therapeutic paracentesis.  EXAM: ULTRASOUND GUIDED DIAGNOSTIC AND THERAPEUTIC  PARACENTESIS  COMPARISON:  None.  PROCEDURE: An ultrasound  guided paracentesis was thoroughly discussed with the patient and questions answered. The benefits, risks, alternatives and complications were also discussed. The patient understands and wishes to proceed with the procedure. Written consent was obtained.  Ultrasound was performed to localize and mark an adequate pocket of fluid in the left lower. quadrant of the abdomen. The area was then prepped and draped in the normal sterile fashion. 1% Lidocaine was used for local anesthesia. Under ultrasound guidance a 19 gauge Yueh catheter was introduced. Paracentesis was performed. The catheter was removed and a dressing applied.  COMPLICATIONS: None.  FINDINGS: A total of approximately 4 liters of yellow. fluid was removed. A fluid sample was sent for laboratory analysis.  IMPRESSION: Successful ultrasound guided diagnostic and therapeutic  paracentesis yielding 4 liters of ascites.  Read by: Rowe Robert ,P.A.-C.   Electronically Signed   By: Jacqulynn Cadet M.D.   On: 07/30/2013 13:35    Scheduled Meds: . amLODipine  10 mg Oral Daily  . atorvastatin  80 mg Oral QHS  . calcium acetate  667 mg Oral TID WC  . cloNIDine  0.2 mg Oral TID  . clopidogrel  75 mg Oral Daily  . furosemide  80 mg Oral BID  . hydrALAZINE  25 mg Oral TID  . insulin aspart  0-9 Units Subcutaneous TID WC  . insulin glargine  30 Units Subcutaneous QHS  . metoprolol  200 mg Oral BID  . polyvinyl alcohol  2 drop Both Eyes Q4H  . sodium bicarbonate  650 mg Oral TID  . sodium chloride  3 mL Intravenous Q12H  . sodium chloride  3 mL Intravenous Q12H  . [START ON 08/02/2013] sodium polystyrene  15 g Oral QODAY  . sodium polystyrene  30 g Oral Once  . terazosin  5 mg Oral BID   Continuous Infusions:   Principal Problem:   Abdominal pain Active Problems:   Renal failure   Diabetes mellitus   HTN (hypertension)   Ascites   CAD (coronary artery disease)    Time spent: [place], [place]  [facility] Pager  [phone]. If 7PM-7AM, please contact night-coverage at [email or web address], password [facility] 08/01/2013, 8:17 AM  LOS: 3 days

## 2013-08-01 NOTE — Progress Notes (Signed)
Angela Soto 6:32 PM  Subjective: Patient about the same without any new complaints awaiting neurologic workup  Objective: Vital signs stable afebrile abdomen is soft obvious ascites iron studies not compatible with hemachromatosis ceruloplasmin pending Doppler ultrasound negative for portal vein thrombosis  Assessment: Multiple medical problems and new onset ascites probably multifactorial  Plan: Await ceruloplasmin and please let us know if we can help any further  [facility]

## 2013-08-02 DIAGNOSIS — G379 Demyelinating disease of central nervous system, unspecified: Secondary | ICD-10-CM | POA: Diagnosis present

## 2013-08-02 LAB — CBC
Hemoglobin: 13.3 g/dL (ref 12.0–15.0)
MCH: 27.5 pg (ref 26.0–34.0)
MCHC: 33.8 g/dL (ref 30.0–36.0)
MCV: 81.6 fL (ref 78.0–100.0)
Platelets: 209 10*3/uL (ref 150–400)
RBC: 4.83 MIL/uL (ref 3.87–5.11)

## 2013-08-02 LAB — PROTEIN ELECTROPHORESIS, SERUM
Albumin ELP: 49.4 % — ABNORMAL LOW (ref 55.8–66.1)
Alpha-1-Globulin: 8.2 % — ABNORMAL HIGH (ref 2.9–4.9)
Alpha-2-Globulin: 10.3 % (ref 7.1–11.8)
Beta 2: 11.6 % — ABNORMAL HIGH (ref 3.2–6.5)
Gamma Globulin: 14.3 % (ref 11.1–18.8)
Total Protein ELP: 5.6 g/dL — ABNORMAL LOW (ref 6.0–8.3)

## 2013-08-02 LAB — C3 COMPLEMENT: C3 Complement: 94 mg/dL (ref 90–180)

## 2013-08-02 LAB — RENAL FUNCTION PANEL
BUN: 59 mg/dL — ABNORMAL HIGH (ref 6–23)
CO2: 23 mEq/L (ref 19–32)
Calcium: 8.3 mg/dL — ABNORMAL LOW (ref 8.4–10.5)
Chloride: 100 mEq/L (ref 96–112)
Creatinine, Ser: 3.25 mg/dL — ABNORMAL HIGH (ref 0.50–1.10)
GFR calc Af Amer: 19 mL/min — ABNORMAL LOW (ref >90)
Glucose, Bld: 145 mg/dL — ABNORMAL HIGH (ref 70–99)
Sodium: 134 mEq/L — ABNORMAL LOW (ref 135–145)

## 2013-08-02 LAB — BODY FLUID CULTURE: Culture: NO GROWTH

## 2013-08-02 LAB — CERULOPLASMIN: Ceruloplasmin: 23 mg/dL (ref 20–60)

## 2013-08-02 LAB — GLUCOSE, CAPILLARY
Glucose-Capillary: 144 mg/dL — ABNORMAL HIGH (ref 70–99)
Glucose-Capillary: 268 mg/dL — ABNORMAL HIGH (ref 70–99)

## 2013-08-02 NOTE — Plan of Care (Signed)
Problem: Food- and Nutrition-Related Knowledge Deficit (NB-1.1) Goal: Nutrition education Formal process to instruct or train a patient/client in a skill or to impart knowledge to help patients/clients voluntarily manage or modify food choices and eating behavior to maintain or improve health. Outcome: Completed/Met Date Met:  08/02/13  Nutrition Education Note  RD consulted for Renal Education. RD initially completed education on 10/15. Pt now with additional questions regarding salads, salad toppings, and salad dressings. Provided additional handouts regarding seasonings. Reviewed materials already given to patient. Pt states that her family eats really poorly at baseline, lots of fried foods and high salt foods. We discussed ways to eat healthier in those situations. Pt verbalized understanding. Teach back method used.  Expect good compliance.  Current diet order is Renal, patient is consuming approximately 100% of meals at this time. Labs and medications reviewed. No further nutrition interventions warranted at this time. RD contact information provided. If additional nutrition issues arise, please re-consult RD.  Inda Coke MS, RD, LDN Pager: [phone] After-hours pager: [phone]

## 2013-08-02 NOTE — Progress Notes (Signed)
Utilization Review Completed.   Farhan Jean, RN, BSN Nurse Case Manager  [phone]  

## 2013-08-02 NOTE — Progress Notes (Signed)
Subjective: No change in visual blurring. No new complaints.  Objective: Current vital signs: BP 161/81  Pulse 63  Temp(Src) 97.8 F (36.6 C) (Oral)  Resp 18  Ht 4\' 3"  (1.295 m)  Wt 94.62 kg (208 lb 9.6 oz)  BMI 56.42 kg/m2  SpO2 100%  Neurologic Exam: Alert and in no acute distress. Mental status was normal. Visual acuity on the left was 20/70, and on the right 20/40. Patient was able to read small parenchymal and her menu. Extraocular movements were full and conjugate. Visual fields were intact and normal. No facial weakness was noted. Speech was normal. Strength of upper extremities was normal.  Medications: I have reviewed the patient's current medications.  Assessment/Plan: Etiology for visual changes remains unclear. Demyelinating disease cannot be ruled out. MRI lesions may well be manifestations of focal strokes, as patient has significant risk factors for stroke, including small vessel strokes.  I will cancel lumbar puncture since patient is taking Plavix and would have to discontinue Plavix for one week prior to undergoing lumbar puncture. The risk of discontinuing antiplatelet therapy with Plavix outweighs potential benefits of obtaining an elective lumbar puncture.  Recommend repeat MRI in 6-12 months for comparison with the last study in August 2014 to rule out further changes indicative of possible multiple sclerosis.  Recommend ophthalmology/optometry appointment for up-to-date refractive lenses, since patient's acuity with far vision apparently improves with her current glasses.   No further neurological intervention is indicated at this point. I will sign off on her care but remain available for followup evaluation if indicated.  C.R. Nicole Kindred, MD [facility] [phone]  08/02/2013  11:04 AM

## 2013-08-02 NOTE — Progress Notes (Signed)
[facility] PROGRESS NOTE  Angela Soto [id number] DOB: Feb 16, 1971 DOA: 07/29/2013 PCP: Rosita Fire, MD  Assessment/Plan: Hyperkalemia -finally had BM  Abdominal pain with diffuse ascites: Patient denies drinking any alcohol, denies any history of hepatitis  - CT abdomen showed ascites with splenomegaly, gallstones anasarca ? Cirrhosis or hepatitis  -Acute hepatitis panel neg. LFTs are normal. lipase ok -Patient had had about 4 L ascitic fluid removed, await further studies  -Dr Anselm Pancoast reviewed the abdominal ultrasound done today, states that the part of the portal vein seen in the exam is patent. Abdominal ultrasound shows multiple tiny gallstones, no cholecystitis. Coarse echotexture and mild nodularity capsular contour is consistent with hepatic cirrhosis. mild splenomegaly.  -GI following- r/o portal vein thrombosis - multifactorial ascities   acute on chronic kidney disease with hyperphosphatemia, hyperkalemia:  -Most likely diabetic nephropathy given duration of DM -renal failure slightly worse this AM -daily labs -SPEP, UPEP, complements, ANA negative, ANCA all still pending  DM -SSI  Elevated CA-125 -suspect related to NASH but will pursue further work up via gyn as outpatient  ?visual disturbance -has seen opthalmology in last 4 months- per patient, no issues with eyes -MRI in 8/14 shows diffuse white matter changes- ask neuro to see- recommend ophthalmology/optometry appointment for up-to-date refractive lenses and repeat MRI in 6-12 months for comparison with the last study in August 2014 to rule out further changes indicative of possible multiple sclerosis  -will arrange for PCP to coordinate care as outpatient   Code Status: full Family Communication: patient at bedside Disposition Plan:    Consultants:  GI  nephro  neuro  Procedures:  paracentesis  Antibiotics:    HPI/Subjective: Patient very anxious to go home  Objective: Filed  Vitals:   08/02/13 0914  BP: 161/81  Pulse: 63  Temp:   Resp:     Intake/Output Summary (Last 24 hours) at 08/02/13 1111 Last data filed at 08/02/13 0917  Gross per 24 hour  Intake   1183 ml  Output   3025 ml  Net  -1842 ml   Filed Weights   07/31/13 0509 08/01/13 0609 08/02/13 0433  Weight: 97.523 kg (215 lb) 96.163 kg (212 lb) 94.62 kg (208 lb 9.6 oz)    Exam:   General:  A+Ox3, NAD  Cardiovascular: rrr  Respiratory: clear anterior  Abdomen: +BS, soft, NT  Skin: multiple tattos  Data Reviewed: Basic Metabolic Panel:  Recent Labs Lab 07/29/13 1904  07/30/13 0507 07/30/13 1015 07/31/13 0440 08/01/13 0525 08/02/13 0455  NA 138  --  136  --  136 137 134*  K 6.2*  < > 5.9* 5.8* 5.3* 5.7* 5.3*  CL 107  --  105  --  104 102 100  CO2 19  --  19  --  21 26 23   GLUCOSE 264*  --  107*  --  98 83 145*  BUN 57*  --  56*  --  55* 58* 59*  CREATININE 3.10*  --  3.06*  --  3.13* 3.19* 3.25*  CALCIUM 8.0*  --  8.0*  --  8.1* 8.4 8.3*  PHOS  --   --  5.7*  --  5.5* 5.8* 5.8*  < > = values in this interval not displayed. Liver Function Tests:  Recent Labs Lab 07/29/13 1904 07/31/13 0440 08/01/13 0525 08/02/13 0455  AST 24 16 11   --   ALT 22 21 15   --   ALKPHOS 116 110 105  --   BILITOT 0.2* 0.2*  0.2*  --   PROT 6.3 6.1 6.2  --   ALBUMIN 2.8* 2.7* 2.8* 2.8*    Recent Labs Lab 07/30/13 0610  LIPASE 53   No results found for this basename: AMMONIA,  in the last 168 hours CBC:  Recent Labs Lab 07/29/13 1904 07/30/13 0507 07/31/13 0440 08/01/13 0525 08/02/13 0455  WBC 7.3 6.6 7.4 6.4 6.9  NEUTROABS 5.6 4.6  --   --   --   HGB 12.8 12.4 12.9 13.3 13.3  HCT 38.7 37.5 38.8 39.5 39.4  MCV 82.3 81.7 81.5 82.1 81.6  PLT 210 220 235 217 209   Cardiac Enzymes: No results found for this basename: CKTOTAL, CKMB, CKMBINDEX, TROPONINI,  in the last 168 hours BNP (last 3 results) No results found for this basename: PROBNP,  in the last 8760  hours CBG:  Recent Labs Lab 08/01/13 0622 08/01/13 1113 08/01/13 1601 08/01/13 2056 08/02/13 0618  GLUCAP 75 136* 146* 175* 120*    Recent Results (from the past 240 hour(s))  BODY FLUID CULTURE     Status: None   Collection Time    07/30/13 12:28 PM      Result Value Range Status   Specimen Description ASCITIC ABDOMEN FLUID   Final   Special Requests 60ML FLUID   Final   Gram Stain     Final   Value: FEW WBC PRESENT,BOTH PMN AND MONONUCLEAR     NO ORGANISMS SEEN     Performed at [facility]   Culture     Final   Value: NO GROWTH 2 DAYS     Performed at [facility]   Report Status PENDING   Incomplete     Studies: [place] Art/ven Flow Abd Pelv Doppler  08/01/2013   CLINICAL DATA:  Evaluate for portal vein thrombosis.  EXAM: DUPLEX ULTRASOUND OF LIVER  TECHNIQUE: Color and duplex Doppler ultrasound was performed to evaluate the hepatic in-flow and out-flow vessels.  COMPARISON:  Ultrasound 07/30/2013  FINDINGS: Portal Vein Velocities  Main:  34 cm/sec  Right:  60 cm/sec  Left:  19 cm/sec  Hepatic Vein Velocities  Right:  33 cm/sec  Middle:  49 cm/sec  Left:  31 cm/sec  Hepatic Artery Velocity:  235 cm/sec  Splenic Vein Velocity:  31 cm/sec  Varices: Absent  Ascites: Present  Normal hepatofugal flow in the hepatic veins. Normal hepatopetal flow in the portal veins. Main portal vein measures 1.8 cm. Spleen measures 12.9 x 17.5 x 6.0 cm. Calculated splenic volume is 720 mL. Findings are compatible with splenomegaly. Splenic vein and IVC are patent.  IMPRESSION: Portal vein is patent. No evidence for portal vein thrombus. Normal direction of flow within the portal veins and hepatic veins.  Splenomegaly and ascites. Findings are compatible with portal hypertension.   Electronically Signed   By: Markus Daft M.D.   On: 08/01/2013 13:15    Scheduled Meds: . amLODipine  10 mg Oral Daily  . atorvastatin  80 mg Oral QHS  . calcium acetate  667 mg Oral TID WC  . cloNIDine  0.2  mg Oral TID  . furosemide  80 mg Oral BID  . hydrALAZINE  25 mg Oral TID  . insulin aspart  0-9 Units Subcutaneous TID WC  . insulin glargine  30 Units Subcutaneous QHS  . metoprolol  200 mg Oral BID  . polyvinyl alcohol  2 drop Both Eyes Q4H  . sodium bicarbonate  650 mg Oral TID  . sodium  chloride  3 mL Intravenous Q12H  . sodium polystyrene  15 g Oral QODAY  . terazosin  5 mg Oral BID   Continuous Infusions:   Principal Problem:   Abdominal pain Active Problems:   Renal failure   Diabetes mellitus   HTN (hypertension)   Ascites   CAD (coronary artery disease)   Demyelinating disease    Time spent: [place], [place]  [facility] Pager [phone]. If 7PM-7AM, please contact night-coverage at [email or web address], password [facility] 08/02/2013, 11:11 AM  LOS: 4 days

## 2013-08-02 NOTE — Progress Notes (Signed)
Subjective:  Wondering when she will go home Good diuresis with stable creatinine on po lasix Has received diet instruction Diuresing well on current lasix  Objective Vital signs in last 24 hours: Filed Vitals:   08/01/13 1948 08/02/13 0433 08/02/13 0912 08/02/13 0914  BP: 175/74 134/69 161/81 161/81  Pulse: 64 65  63  Temp: 98 F (36.7 C) 97.8 F (36.6 C)    TempSrc: Oral Oral    Resp: 18 18    Height:      Weight:  94.62 kg (208 lb 9.6 oz)    SpO2: 100% 100%     Weight change: -1.542 kg (-3 lb 6.4 oz)  Intake/Output Summary (Last 24 hours) at 08/02/13 1217 Last data filed at 08/02/13 0917  Gross per 24 hour  Intake   1183 ml  Output   3025 ml  Net  -1842 ml   Physical Exam:  BP 161/81  Pulse 63  Temp(Src) 97.8 F (36.6 C) (Oral)  Resp 18  Ht 4\' 3"  (1.295 m)  Wt 94.62 kg (208 lb 9.6 oz)  BMI 56.42 kg/m2  SpO2 100% Looks comfortable; awake, alert No JVD Lungs clear Abdomen soft Liver edge 4 cn below RCM No localized tenderness Stumps with no edema  I/O last 3 completed shifts: In: 1183 [P.O.:1180; I.V.:3] Out: 4865 [Urine:4865] Total I/O In: 483 [P.O.:480; I.V.:3] Out: 500 [Urine:500] Weight trending: 08/02/13 0433 94.62 kg  08/01/13 0609 96.1 kg  07/31/13 0509 97.5 kg   07/30/13 0157 104.9 kg    Recent Labs Lab 07/29/13 1904 07/29/13 2155 07/30/13 0507 07/30/13 1015 07/31/13 0440 08/01/13 0525 08/02/13 0455  NA 138  --  136  --  136 137 134*  K 6.2* 5.9* 5.9* 5.8* 5.3* 5.7* 5.3*  CL 107  --  105  --  104 102 100  CO2 19  --  19  --  21 26 23   GLUCOSE 264*  --  107*  --  98 83 145*  BUN 57*  --  56*  --  55* 58* 59*  CREATININE 3.10*  --  3.06*  --  3.13* 3.19* 3.25*  CALCIUM 8.0*  --  8.0*  --  8.1* 8.4 8.3*  PHOS  --   --  5.7*  --  5.5* 5.8* 5.8*   Liver Function Tests:  Recent Labs Lab 07/29/13 1904 07/31/13 0440 08/01/13 0525 08/02/13 0455  AST 24 16 11   --   ALT 22 21 15   --   ALKPHOS 116 110 105  --   BILITOT 0.2* 0.2*  0.2*  --   PROT 6.3 6.1 6.2  --   ALBUMIN 2.8* 2.7* 2.8* 2.8*    Recent Labs Lab 07/30/13 0610  LIPASE 53    Recent Labs Lab 07/29/13 1904 07/30/13 0507 07/31/13 0440 08/01/13 0525 08/02/13 0455  WBC 7.3 6.6 7.4 6.4 6.9  NEUTROABS 5.6 4.6  --   --   --   HGB 12.8 12.4 12.9 13.3 13.3  HCT 38.7 37.5 38.8 39.5 39.4  MCV 82.3 81.7 81.5 82.1 81.6  PLT 210 220 235 217 209    Recent Labs Lab 08/01/13 1113 08/01/13 1601 08/01/13 2056 08/02/13 0618 08/02/13 1106  GLUCAP 136* 146* 175* 120* 144*   Results for ISABELLY, CATLEDGE (MRN [id number]) as of 07/31/2013 10:08  Ref. Range 07/30/2013 06:10  HIV Latest Range: NON REACTIVE  NON REACTIVE   Results for SHIESHA, MANCA (MRN [id number]) as of 07/31/2013 10:08  Ref. Range 07/30/2013  06:10  Hep A IgM Latest Range: NON REACTIVE  NON REACTIVE  Hepatitis B Surface Ag Latest Range: NEGATIVE  NEGATIVE  Hep B C IgM Latest Range: NON REACTIVE  NON REACTIVE  HCV Ab Latest Range: NEGATIVE  NEGATIVE   Results for MERY, DEATER (MRN [id number]) as of 07/31/2013 10:08  Ref. Range 07/30/2013 14:20  CA 125 Latest Range: 0.0-30.2 U/mL 65.8 (H)   Results for KYLEENA, DAZA (MRN [id number]) as of 07/31/2013 10:08  Ref. Range 07/29/2013 23:40  07/30/2013 16:28  PROTEIN CREATININE RATIO Latest Range: 0.00-0.15    2.75 (H)   Results for BLIMA, HUITT (MRN [id number]) as of 07/31/2013 10:08  Ref. Range 07/30/2013 12:28  Monocyte-Macrophage-Serous Fluid Latest Range: 50-90 % 84  Albumin, Fluid No range found 1.8  Fluid Type-FALB No range found ASCITIC  Amylase, Fluid No range found 18  Fluid Type-FAMY No range found ASCITIC  Fluid Type-FLDH No range found ASCITIC  LD, Fluid Latest Range: 3-23 U/L 43 (H)  Total protein, fluid No range found 3.2  Fluid Type-FCT No range found ASCITIC  Fluid Type-FTP No range found ASCITIC  Color, Fluid Latest Range: YELLOW  YELLOW (A)  WBC, Fluid Latest Range: 0-1000 cu mm 125  Lymphs, Fluid No  range found 12  Eos, Fluid No range found 0  Appearance, Fluid Latest Range: CLEAR  HAZY (A)  Neutrophil Count, Fluid Latest Range: 0-25 % 4   ANA, ANCA, negative  C3, C4, SPEP, UPEP all still pending Studies/Results: [place] Art/ven Flow Abd Pelv Doppler  08/01/2013   CLINICAL DATA:  Evaluate for portal vein thrombosis.  EXAM: DUPLEX ULTRASOUND OF LIVER  TECHNIQUE: Color and duplex Doppler ultrasound was performed to evaluate the hepatic in-flow and out-flow vessels.  COMPARISON:  Ultrasound 07/30/2013  FINDINGS: Portal Vein Velocities  Main:  34 cm/sec  Right:  60 cm/sec  Left:  19 cm/sec  Hepatic Vein Velocities  Right:  33 cm/sec  Middle:  49 cm/sec  Left:  31 cm/sec  Hepatic Artery Velocity:  235 cm/sec  Splenic Vein Velocity:  31 cm/sec  Varices: Absent  Ascites: Present  Normal hepatofugal flow in the hepatic veins. Normal hepatopetal flow in the portal veins. Main portal vein measures 1.8 cm. Spleen measures 12.9 x 17.5 x 6.0 cm. Calculated splenic volume is 720 mL. Findings are compatible with splenomegaly. Splenic vein and IVC are patent.  IMPRESSION: Portal vein is patent. No evidence for portal vein thrombus. Normal direction of flow within the portal veins and hepatic veins.  Splenomegaly and ascites. Findings are compatible with portal hypertension.   Electronically Signed   By: Markus Daft M.D.   On: 08/01/2013 13:15   Medications:   . amLODipine  10 mg Oral Daily  . atorvastatin  80 mg Oral QHS  . calcium acetate  667 mg Oral TID WC  . cloNIDine  0.2 mg Oral TID  . furosemide  80 mg Oral BID  . hydrALAZINE  25 mg Oral TID  . insulin aspart  0-9 Units Subcutaneous TID WC  . insulin glargine  30 Units Subcutaneous QHS  . metoprolol  200 mg Oral BID  . polyvinyl alcohol  2 drop Both Eyes Q4H  . sodium bicarbonate  650 mg Oral TID  . sodium chloride  3 mL Intravenous Q12H  . sodium polystyrene  15 g Oral QODAY  . terazosin  5 mg Oral BID   I  have reviewed scheduled and prn  medications.  Impression/Recommendations  42  y.o. year-old WF with longstanding DM, HTN, bilateral BKA's, CAD with H/O MI and stents, with known CKD (creatine 2.3 in 02/2013) who presents with abdominal pain, fairly abrupt onset of ascites and worsening renal function   1. CKD4 vs AKI on CKD - Most likely diabetic nephropathy given duration of DM,  suspect this is just progression of diabetic nephropathy; full proteinuria workup in progress. 2.45 gm protein on UPC.  Hepatitis serologies negative;   SPEP, UPEP, complements still pending but all workup to date compatible with DM. Saving left arm. On po lasix  . States has f/u already scheduled with her nephrologist on 10/29 2. CKD-MBD - started phoslo 1 tid with meals. PTH 86.1 - no rx needed until >100 3. Hyperkalemia - K has remained up despite  restricted diet, corrected acidosis, loop diuretics.  On QOD kayexelate 4. Metabolic acidosis with normal gap - likely RTA from DM. On oral bicarb. Dose OK 5. Ascites with cirrhosis,splenomegaly, changes of portal hypertension.  Cytologies pending.  Other studies as above. GI evaluating. Probable NASH.  6. HTN - meds/diuretics. No ACE or ARB (contraindicated due to issues with hyperkalemia)  7. CAD with h/o stents - asympt 8. Bilat BKA's  9. Visual disturbance - see neuro recommendations 10. Elevated CA-125  - felt poss related to NASH - for further eval as outpt  From a renal vantage point, patient would be reasonable for discharge and should go home on K restricted diet (has already had diet instruction) QOD kayexelate 15 grams, oral bicarb at dose on med list, and lasix at 80 BID.  Has f/u already scheduled with her nephrologist on the 29th in Dr. Gwenlyn Found office.  Only outstanding labs are SPEP, UPEP, complements - but they have access to EPIC at [facility] and could follow up those labs on an outpt basis. Will therefore sign off, please call if specific questions.    Also - drugs contraindicated for use  EVER in this pt are ACE, ARB, trimethoprim, spironolactone.  Jamal Maes, MD [facility] [phone] pager 08/02/2013, 12:17 PM

## 2013-08-02 NOTE — Progress Notes (Signed)
Angela Soto 3:08 PM  Subjective: Patient without complaints and wants to go home  Objective: Vital signs stable afebrile no acute distress abdominal exam unchanged labs stable ceruloplasmin still pending abdominal cytology negative  Assessment: Multiple medical problems including new onset ascites with probable Angela Soto Plan: Okay with me to go home and happy to see back when necessary and she'll call me in one week to about the ceruloplasmin if she does not hear from [place]  [facility]

## 2013-08-02 NOTE — Progress Notes (Signed)
08/02/13 1430 Received call back from [facility] for pt.'s new PCP.  An appointment was made with Dr. Clydene Laming at the [facility] in [place] at 2:00pm on 08/06/13.  This NCM gave this appointment to the pt. and will enter on pt.'s AVS for discharge.  Pt. is agreeable to this PCP.  Pt. may dc today. Llana Aliment, RN, BSN NCM [phone]

## 2013-08-03 LAB — BASIC METABOLIC PANEL
BUN: 64 mg/dL — ABNORMAL HIGH (ref 6–23)
CO2: 21 mEq/L (ref 19–32)
Calcium: 8.1 mg/dL — ABNORMAL LOW (ref 8.4–10.5)
Chloride: 100 mEq/L (ref 96–112)
Creatinine, Ser: 3.16 mg/dL — ABNORMAL HIGH (ref 0.50–1.10)
GFR calc Af Amer: 20 mL/min — ABNORMAL LOW (ref >90)
GFR calc non Af Amer: 17 mL/min — ABNORMAL LOW (ref >90)
Glucose, Bld: 116 mg/dL — ABNORMAL HIGH (ref 70–99)
Potassium: 5.1 mEq/L (ref 3.5–5.1)
Sodium: 135 mEq/L (ref 135–145)

## 2013-08-03 LAB — GLUCOSE, CAPILLARY
Glucose-Capillary: 98 mg/dL (ref 70–99)
Glucose-Capillary: 93 mg/dL (ref 70–99)

## 2013-08-03 LAB — CBC
HCT: 39.3 % (ref 36.0–46.0)
Hemoglobin: 13.3 g/dL (ref 12.0–15.0)
MCH: 27.7 pg (ref 26.0–34.0)
MCHC: 33.8 g/dL (ref 30.0–36.0)
MCV: 81.7 fL (ref 78.0–100.0)
Platelets: 212 10*3/uL (ref 150–400)
RBC: 4.81 MIL/uL (ref 3.87–5.11)
RDW: 13.8 % (ref 11.5–15.5)
WBC: 8.5 10*3/uL (ref 4.0–10.5)

## 2013-08-03 MED ORDER — SODIUM BICARBONATE 650 MG PO TABS: 650.0000 mg | ORAL_TABLET | Freq: Three times a day (TID) | ORAL | Status: DC

## 2013-08-03 MED ORDER — FUROSEMIDE 80 MG PO TABS: 80.0000 mg | ORAL_TABLET | Freq: Two times a day (BID) | ORAL | Status: DC

## 2013-08-03 MED ORDER — HYDROCODONE-ACETAMINOPHEN 5-325 MG PO TABS: 1.0000 | ORAL_TABLET | Freq: Four times a day (QID) | ORAL | Status: DC | PRN

## 2013-08-03 MED ORDER — CALCIUM ACETATE (PHOS BINDER) 667 MG/5ML PO SOLN: 667.0000 mg | Freq: Three times a day (TID) | ORAL | Status: DC

## 2013-08-03 MED ORDER — SODIUM POLYSTYRENE SULFONATE 15 GM/60ML PO SUSP: 15.0000 g | ORAL | Status: DC

## 2013-08-03 MED ORDER — INSULIN GLARGINE 100 UNIT/ML ~~LOC~~ SOLN: 30.0000 [IU] | Freq: Every day | SUBCUTANEOUS | Status: DC

## 2013-08-03 NOTE — Discharge Summary (Signed)
Physician Discharge Summary  PAVANDEEP LAWLER [id number] DOB: [birth year] DOA: 07/29/2013  PCP: Doran Heater, MD  Admit date: 07/29/2013 Discharge date: 08/03/2013  Time spent: 35 minutes  Recommendations for Outpatient Follow-up:  1. MRI 1 year 2. Appointment with gyn for elevated CA-125 SPEP, UPEP, complements, ANA, ANCA  pending  Discharge Diagnoses:  Principal Problem:   Abdominal pain Active Problems:   Renal failure   Diabetes mellitus   HTN (hypertension)   Ascites   CAD (coronary artery disease)   Demyelinating disease   Discharge Condition: improved  Diet recommendation: low K diet  Filed Weights   08/01/13 0609 08/02/13 0433 08/03/13 0441  Weight: 96.163 kg (212 lb) 94.62 kg (208 lb 9.6 oz) 93.8 kg (206 lb 12.7 oz)    History of present illness:  Angela Soto is a 42 y.o. female known history of diabetes mellitus type 2, hypertension, CAD status post stenting, peripheral vascular disease status post bilateral BKA with chronic kidney disease and was told that she had her kidney function only 23% by her nephrologist last month with home she had met first time presented to the ER last evening because of worsening abdominal pain. Patient states that she has been having abdominal discomfort for last few months which is been gradually worsening. Over the last one week she also had increasing distention of the abdomen. Patient also has been having increasing lower extremity edema. Denies any nausea vomiting diarrhea fever chills. Abdominal pain was not related to food. The abdominal pain is diffuse. In the ER CT abdomen and pelvis show marked as it is with splenomegaly and gallstones. In addition her lab work showed hyperkalemia for which patient was given Kayexalate 30 g. Patient has been admitted for further management. Patient denies any chest pain or shortness of breath.  Patient states that she used to take a lot of NSAIDs previously 2 years ago but had stopped  taking it. Presently she takes a lot of Excedrin up to 6 tablets a day due to the pain.   Hospital Course:  Hyperkalemia  -finally had BM and has resolved Kay-exelate QOD  Abdominal pain with diffuse ascites: Patient denies drinking any alcohol, denies any history of hepatitis  - CT abdomen showed ascites with splenomegaly, gallstones anasarca ? Cirrhosis or hepatitis  -Acute hepatitis panel neg. LFTs are normal. lipase ok  -Patient had had about 4 L ascitic fluid removed, await further studies  -Dr Anselm Pancoast reviewed the abdominal ultrasound done today, states that the part of the portal vein seen in the exam is patent. Abdominal ultrasound shows multiple tiny gallstones, no cholecystitis. Coarse echotexture and mild nodularity capsular contour is consistent with hepatic cirrhosis. mild splenomegaly.  -GI following- ruled out portal vein thrombosis - multifactorial ascities  acute on chronic kidney disease with hyperphosphatemia, hyperkalemia:  -Most likely diabetic nephropathy given duration of DM  -SPEP, UPEP, complements, ANA negative, ANCA all still pending   DM    Elevated CA-125  -suspect related to NASH but will pursue further work up via gyn as outpatient   ?visual disturbance  -has seen opthalmology in last 4 months- per patient, no issues with eyes  -MRI in 8/14 shows diffuse white matter changes- ask neuro to see- recommend ophthalmology/optometry appointment for up-to-date refractive lenses and repeat MRI in 6-12 months for comparison with the last study in August 2014 to rule out further changes indicative of possible multiple sclerosis   Procedures:  paracentesis  Consultations:  GI  Neuro  nephro  Discharge Exam: Filed Vitals:   08/03/13 0441  BP: 101/49  Pulse: 65  Temp: 98.4 F (36.9 C)  Resp: 18    General: pleasant/cooeprative Cardiovascular: rrr Respiratory: clear anterior  Discharge Instructions      Discharge Orders   Future Appointments  Provider Department Dept Phone   08/06/2013 2:10 PM Doran Heater, MD [facility] [phone]   Future Orders Complete By Expires   Discharge instructions  As directed    Comments:     Cbc, bmp 1 week Low K diet Be sure BMs are daily- can use OTC medications  drugs contraindicated for use EVER in this pt are ACE, ARB, trimethoprim, spironolactone Follow up optho Follow up renal Follow up with GI in 1 week Follow up with neuro Follow up with new PCP   Increase activity slowly  As directed        Medication List    STOP taking these medications       EXCEDRIN BACK & BODY 250-250 MG tablet  Generic drug:  Acetaminophen-Aspirin Buffered      TAKE these medications       amLODipine 10 MG tablet  Commonly known as:  NORVASC  Take 10 mg by mouth daily.     atorvastatin 80 MG tablet  Commonly known as:  LIPITOR  Take 80 mg by mouth at bedtime.     calcium acetate (Phos Binder) 667 MG/5ML Soln  Commonly known as:  PHOSLYRA  Take 5 mLs (667 mg total) by mouth 3 (three) times daily with meals.     cloNIDine 0.2 MG tablet  Commonly known as:  CATAPRES  Take 0.2 mg by mouth 2 (two) times daily.     clopidogrel 75 MG tablet  Commonly known as:  PLAVIX  Take 75 mg by mouth daily.     ergocalciferol 50000 UNITS capsule  Commonly known as:  VITAMIN D2  Take 50,000 Units by mouth once a week. fridays     FOLIC ACID PO  Take 1 tablet by mouth daily.     furosemide 80 MG tablet  Commonly known as:  LASIX  Take 1 tablet (80 mg total) by mouth 2 (two) times daily.     hydrALAZINE 25 MG tablet  Commonly known as:  APRESOLINE  Take 25 mg by mouth 3 (three) times daily.     HYDROcodone-acetaminophen 5-325 MG per tablet  Commonly known as:  NORCO/VICODIN  Take 1 tablet by mouth every 6 (six) hours as needed for pain.     insulin aspart 100 UNIT/ML injection  Commonly known as:  novoLOG  Inject 14 Units into the skin 3 (three) times daily with  meals.     insulin glargine 100 UNIT/ML injection  Commonly known as:  LANTUS  Inject 0.3 mLs (30 Units total) into the skin at bedtime.     metoprolol 200 MG 24 hr tablet  Commonly known as:  TOPROL-XL  Take 200 mg by mouth 2 (two) times daily.     sodium bicarbonate 650 MG tablet  Take 1 tablet (650 mg total) by mouth 3 (three) times daily.     sodium polystyrene 15 GM/60ML suspension  Commonly known as:  KAYEXALATE  Take 60 mLs (15 g total) by mouth every other day.     terazosin 5 MG capsule  Commonly known as:  HYTRIN  Take 5 mg by mouth 2 (two) times daily.     THERATEARS 0.25 % Soln  Generic drug:  Carboxymethylcellulose Sodium  Apply 2 drops to eye every 4 (four) hours.       Allergies  Allergen Reactions  . Metformin And Related Nausea And Vomiting   Follow-up Information   Follow up with Doran Heater, MD On 08/06/2013. (At 2:00pm.  Please bring your Medicare and Medicaid cards, and your photo identification.  They will ask you to sign a medical release form; so that they can obtain your records from Dr. Josephine Cables office.  )    Specialty:  Family Medicine   Contact information:   [facility] [place] [id number] [phone]        The results of significant diagnostics from this hospitalization (including imaging, microbiology, ancillary and laboratory) are listed below for reference.    Significant Diagnostic Studies: Ct Abdomen Pelvis Wo Contrast  07/30/2013   CLINICAL DATA:  Abdominal pain.  EXAM: CT ABDOMEN AND PELVIS WITHOUT CONTRAST  TECHNIQUE: Multidetector CT imaging of the abdomen and pelvis was performed following the standard protocol without intravenous contrast.  COMPARISON:  None.  FINDINGS: Unenhanced CT was performed per clinician order. Lack of IV contrast limits sensitivity and specificity, especially for evaluation of abdominal/pelvic solid viscera.  No infiltrates. Cardiomegaly. Marked ascites.  Anasarca. Large hepatic granuloma. Splenomegaly. Layering gallstones with no definite wall thickening. Non aneurysmal aorta and iliac vascular calcifications. No bowel obstruction. No renal obstruction. No worrisome osseous lesions. Small umbilical hernia containing fat. No pelvic masses.  IMPRESSION: Marked ascites. Splenomegaly. Gallstones. Anasarca. Question cirrhosis or hepatitis. No pleural effusions to suggest cardiac failure. Elective paracentesis could be helpful in further evaluation.   Electronically Signed   By: Rolla Flatten M.D.   On: 07/30/2013 00:36   US Abdomen Complete  07/30/2013   CLINICAL DATA:  Abdominal pain. Cholelithiasis. Renal insufficiency. Ascites. Diabetes and hypertension.  EXAM: ULTRASOUND ABDOMEN COMPLETE  COMPARISON:  None.  FINDINGS: Gallbladder  Multiple tiny gallstones are identified. No significant gallbladder wall thickening or pericholecystic fluid noted. No sonographic Murphy sign noted.  Common bile duct  Diameter: 6 mm  Liver  3 cm parenchymal calcification noted in the peripheral right hepatic lobe. No liver mass identified. Coarse echotexture and mild nodularity capsular contour is consistent with hepatic cirrhosis. Mild ascites also noted.  IVC  No abnormality visualized.  Pancreas  Visualized portion unremarkable.  Spleen  Mild splenomegaly, with length measuring 14 cm and estimated volume of 827 cc.  Right Kidney  Length: 13.8 cm. Diffusely increased parenchymal echogenicity. No evidence of renal mass or hydronephrosis.  Left Kidney  Length: 13.6 cm. Diffusely increased parenchymal echogenicity. No evidence of renal mass or hydronephrosis.  Abdominal aorta  No aneurysm visualized.  IMPRESSION: Cholelithiasis. No sonographic signs of acute cholecystitis or biliary dilatation.  Hepatic cirrhosis. No evidence of hepatic neoplasm.  Mild ascites and splenomegaly, consistent with portal venous hypertension.  Nonspecific medical renal disease. No evidence of hydronephrosis.    Electronically Signed   By: Earle Gell M.D.   On: 07/30/2013 13:39   [place] Art/ven Flow Abd Pelv Doppler  08/01/2013   CLINICAL DATA:  Evaluate for portal vein thrombosis.  EXAM: DUPLEX ULTRASOUND OF LIVER  TECHNIQUE: Color and duplex Doppler ultrasound was performed to evaluate the hepatic in-flow and out-flow vessels.  COMPARISON:  Ultrasound 07/30/2013  FINDINGS: Portal Vein Velocities  Main:  34 cm/sec  Right:  60 cm/sec  Left:  19 cm/sec  Hepatic Vein Velocities  Right:  33 cm/sec  Middle:  49 cm/sec  Left:  31 cm/sec  Hepatic Artery Velocity:  235 cm/sec  Splenic Vein Velocity:  31 cm/sec  Varices: Absent  Ascites: Present  Normal hepatofugal flow in the hepatic veins. Normal hepatopetal flow in the portal veins. Main portal vein measures 1.8 cm. Spleen measures 12.9 x 17.5 x 6.0 cm. Calculated splenic volume is 720 mL. Findings are compatible with splenomegaly. Splenic vein and IVC are patent.  IMPRESSION: Portal vein is patent. No evidence for portal vein thrombus. Normal direction of flow within the portal veins and hepatic veins.  Splenomegaly and ascites. Findings are compatible with portal hypertension.   Electronically Signed   By: Markus Daft M.D.   On: 08/01/2013 13:15   US Paracentesis  07/30/2013   CLINICAL DATA:  Abdominal pain, acute on chronic kidney disease, ascites. Request is made for diagnostic and therapeutic paracentesis.  EXAM: ULTRASOUND GUIDED DIAGNOSTIC AND THERAPEUTIC  PARACENTESIS  COMPARISON:  None.  PROCEDURE: An ultrasound guided paracentesis was thoroughly discussed with the patient and questions answered. The benefits, risks, alternatives and complications were also discussed. The patient understands and wishes to proceed with the procedure. Written consent was obtained.  Ultrasound was performed to localize and mark an adequate pocket of fluid in the left lower. quadrant of the abdomen. The area was then prepped and draped in the normal sterile fashion. 1% Lidocaine was  used for local anesthesia. Under ultrasound guidance a 19 gauge Yueh catheter was introduced. Paracentesis was performed. The catheter was removed and a dressing applied.  COMPLICATIONS: None.  FINDINGS: A total of approximately 4 liters of yellow. fluid was removed. A fluid sample was sent for laboratory analysis.  IMPRESSION: Successful ultrasound guided diagnostic and therapeutic paracentesis yielding 4 liters of ascites.  Read by: Rowe Robert ,P.A.-C.   Electronically Signed   By: Jacqulynn Cadet M.D.   On: 07/30/2013 13:35    Microbiology: Recent Results (from the past 240 hour(s))  BODY FLUID CULTURE     Status: None   Collection Time    07/30/13 12:28 PM      Result Value Range Status   Specimen Description ASCITIC ABDOMEN FLUID   Final   Special Requests 60ML FLUID   Final   Gram Stain     Final   Value: FEW WBC PRESENT,BOTH PMN AND MONONUCLEAR     NO ORGANISMS SEEN     Performed at [facility]   Culture     Final   Value: NO GROWTH 3 DAYS     Performed at [facility]   Report Status 08/02/2013 FINAL   Final     Labs: Basic Metabolic Panel:  Recent Labs Lab 07/29/13 1904  07/30/13 0507 07/30/13 1015 07/31/13 0440 08/01/13 0525 08/02/13 0455 08/03/13 0416  NA 138  --  136  --  136 137 134* 135  K 6.2*  < > 5.9* 5.8* 5.3* 5.7* 5.3* 5.1  CL 107  --  105  --  104 102 100 100  CO2 19  --  19  --  21 26 23 21   GLUCOSE 264*  --  107*  --  98 83 145* 116*  BUN 57*  --  56*  --  55* 58* 59* 64*  CREATININE 3.10*  --  3.06*  --  3.13* 3.19* 3.25* 3.16*  CALCIUM 8.0*  --  8.0*  --  8.1* 8.4 8.3* 8.1*  PHOS  --   --  5.7*  --  5.5* 5.8* 5.8*  --   < > =  values in this interval not displayed. Liver Function Tests:  Recent Labs Lab 07/29/13 1904 07/31/13 0440 08/01/13 0525 08/02/13 0455  AST 24 16 11   --   ALT 22 21 15   --   ALKPHOS 116 110 105  --   BILITOT 0.2* 0.2* 0.2*  --   PROT 6.3 6.1 6.2  --   ALBUMIN 2.8* 2.7* 2.8* 2.8*    Recent  Labs Lab 07/30/13 0610  LIPASE 53   No results found for this basename: AMMONIA,  in the last 168 hours CBC:  Recent Labs Lab 07/29/13 1904 07/30/13 0507 07/31/13 0440 08/01/13 0525 08/02/13 0455 08/03/13 0416  WBC 7.3 6.6 7.4 6.4 6.9 8.5  NEUTROABS 5.6 4.6  --   --   --   --   HGB 12.8 12.4 12.9 13.3 13.3 13.3  HCT 38.7 37.5 38.8 39.5 39.4 39.3  MCV 82.3 81.7 81.5 82.1 81.6 81.7  PLT 210 220 235 217 209 212   Cardiac Enzymes: No results found for this basename: CKTOTAL, CKMB, CKMBINDEX, TROPONINI,  in the last 168 hours BNP: BNP (last 3 results) No results found for this basename: PROBNP,  in the last 8760 hours CBG:  Recent Labs Lab 08/01/13 2056 08/02/13 0618 08/02/13 1106 08/02/13 1612 08/02/13 2055  GLUCAP 175* 120* 144* 111* 268*       Signed:  Martavious Hartel  [facility] 08/03/2013, 8:46 AM

## 2013-08-03 NOTE — Progress Notes (Signed)
Verbalized understanding of discharge instructions.

## 2013-08-03 NOTE — Progress Notes (Signed)
Client transfers from bed to wheelchair utilizing a slide board.  No break in skin integrity noted.

## 2013-08-06 ENCOUNTER — Ambulatory Visit (INDEPENDENT_AMBULATORY_CARE_PROVIDER_SITE_OTHER): Payer: Medicare Other | Admitting: Family Medicine

## 2013-08-06 VITALS — BP 138/72 | Temp 98.4°F

## 2013-08-06 DIAGNOSIS — E119 Type 2 diabetes mellitus without complications: Secondary | ICD-10-CM | POA: Diagnosis not present

## 2013-08-06 DIAGNOSIS — R971 Elevated cancer antigen 125 [CA 125]: Secondary | ICD-10-CM

## 2013-08-06 DIAGNOSIS — Z993 Dependence on wheelchair: Secondary | ICD-10-CM

## 2013-08-06 DIAGNOSIS — K7460 Unspecified cirrhosis of liver: Secondary | ICD-10-CM | POA: Diagnosis not present

## 2013-08-06 DIAGNOSIS — G894 Chronic pain syndrome: Secondary | ICD-10-CM

## 2013-08-06 MED ORDER — WHEELCHAIR CUSHION MISC: 1.0000 [IU] | Freq: Every day | Status: DC

## 2013-08-06 NOTE — Progress Notes (Signed)
Subjective:    Patient ID: Angela Soto, female    DOB: [birth year], 42 y.o.   MRN: [id number]  HPIPt here to establish care. Prior to now she saw Dr. Legrand Rams. She was recently in the hospital for abd pain. She also has several chronic medical issues.   abd pain - diffuse pain, had several liters ascites removed, still very tender intermittantly. Transferring very painful. Saw GI in the hospital but not now, mostly RUQ and umb hernia are what hurt.   Renal failure - sees nephrology, GFR 17 most recently.  DM - has been diabetic since age 52. 5 years ago needed bilateral bka and started taking the DM more seriously. Prior to then, not well controlled. She has been giving herself 38 units lantus. Sugars have been roughly 90 in the morning, has not been needing mealtime since discharge very much due to sugars being low. a1c this month 5.9.  HTN - controlled well on current regimine,   Ascites - due to cirrhosis, new dx this admission, thought to be NASH at this point.  CAD s/p stents - exacerbated by DM, stents placed 1998 and 2000, no MIs  Demyelinating - sees Dr. Merlene Laughter in early November, suspect MS  PVD - likely due to DM as well  S/p bilateral BKA - due to DM  Vision problems - sees Dr. Clovia Cuff (optho), next appt 08/08/13  Had elevated ca125 in the hospital - suspect due to cirrhosis but will have her f/u w gyn about this as ell as basic gyn care (she has not had gyn care for "many years")  Review of Systems per hpi     Objective:   Physical Exam  Nursing note and vitals reviewed. Constitutional: She is oriented to person, place, and time. She appears well-developed and well-nourished.  HENT:  Right Ear: External ear normal.  Left Ear: External ear normal.  Nose: Nose normal.  Mouth/Throat: Oropharynx is clear and moist. No oropharyngeal exudate.  Eyes: Conjunctivae are normal. Pupils are equal, round, and reactive to light.  Neck: Normal range of motion. Neck supple. No  thyromegaly present.  Cardiovascular: Normal rate, regular rhythm and normal heart sounds.   Pulmonary/Chest: Effort normal and breath sounds normal.  Abdominal: Soft. Bowel sounds are normal. She exhibits no distension. Mild ttp at umbillicus Lymphadenopathy:    She has no cervical adenopathy.  Neurological: She is alert and oriented to person, place, and time. She has normal reflexes.  Skin: Skin is warm and dry.  Psychiatric: She has a normal mood and affect. Her behavior is normal.  msk - s/p bilateral bka      Assessment & Plan:  Diabetes - Plan: Amb ref to Medical Nutrition Therapy-MNT  Cirrhosis of liver without mention of alcohol - Plan: Amb ref to Medical Nutrition Therapy-MNT  Wheelchair bound - Plan: Misc. Devices (WHEELCHAIR CUSHION) MISC  Chronic pain syndrome - Plan: Ambulatory referral to Pain Clinic  Elevated CA-125 - Plan: Ambulatory referral to Gynecology  Orders as above  abd pain - declines referral to GI at this time. Also declines referral to surgery for herniaraphy.   Renal failure - f/u w nephro  DM - decrease lantus by 3 units every few days until goal am sugars achieved (100-120, no dizziness). Will check a1c at f/u. If continues to be very difficult to control sugars my consider referral to endo esp given her long list of complications form the DM   HTN - cont current meds  Ascites/cirrhosis - will  follow for now. May need referral to GI but declines at present. F/u w nutrition for help on diet  CAD s/p stents - currently no cp. Will monitor. At some point may benefit from stress test/echo.  Demyelinating - sees Dr. Merlene Laughter in early November, suspect MS  PVD - likely due to DM as well, no change in mgmt  S/p bilateral BKA - due to DM, follow  Vision problems - sees Dr. Clovia Cuff (optho), next appt 08/08/13  Had elevated ca125 in the hospital - suspect due to cirrhosis but will have her f/u w gyn about this as ell as basic gyn care (she has not had  gyn care for "many years")  F/u 6 weeks

## 2013-08-06 NOTE — Patient Instructions (Signed)
-   Decrease lantus by 3 units per day until low 100's in the morning. You should not feel shaky in the mornings.  - Follow up with your eye doctor, nephrologist and neurologist. Also follow up with nutrition, gyn and the pain doctor.  - We will see you in 6 weeks, earlier if needed.

## 2013-08-08 ENCOUNTER — Encounter: Payer: Self-pay | Admitting: Family Medicine

## 2013-08-15 ENCOUNTER — Telehealth (HOSPITAL_COMMUNITY): Payer: Self-pay | Admitting: Dietician

## 2013-08-15 NOTE — Telephone Encounter (Signed)
Called at 1126. Appointment scheduled for 08/28/13 at 1400.

## 2013-08-15 NOTE — Telephone Encounter (Signed)
Located referral in work que today. Originally entered by [employer] on 08/06/13 by [facility] staff for dx: DM, cirrhosis.

## 2013-08-16 DIAGNOSIS — N189 Chronic kidney disease, unspecified: Secondary | ICD-10-CM | POA: Diagnosis not present

## 2013-08-16 DIAGNOSIS — E559 Vitamin D deficiency, unspecified: Secondary | ICD-10-CM | POA: Diagnosis not present

## 2013-08-16 DIAGNOSIS — Z79899 Other long term (current) drug therapy: Secondary | ICD-10-CM | POA: Diagnosis not present

## 2013-08-16 DIAGNOSIS — D649 Anemia, unspecified: Secondary | ICD-10-CM | POA: Diagnosis not present

## 2013-08-16 DIAGNOSIS — I129 Hypertensive chronic kidney disease with stage 1 through stage 4 chronic kidney disease, or unspecified chronic kidney disease: Secondary | ICD-10-CM | POA: Diagnosis not present

## 2013-08-16 DIAGNOSIS — R809 Proteinuria, unspecified: Secondary | ICD-10-CM | POA: Diagnosis not present

## 2013-08-22 ENCOUNTER — Telehealth: Payer: Self-pay | Admitting: *Deleted

## 2013-08-22 NOTE — Telephone Encounter (Signed)
Nurse called pt and informed her that Physical Medicine would not be able to see her and that she needed to continue seeing Dr. Merlene Laughter and speak with him about her pain. Pt understanding and appreciative.

## 2013-08-26 ENCOUNTER — Encounter: Payer: Medicare Other | Admitting: Obstetrics & Gynecology

## 2013-08-26 DIAGNOSIS — R51 Headache: Secondary | ICD-10-CM | POA: Diagnosis not present

## 2013-08-26 DIAGNOSIS — R269 Unspecified abnormalities of gait and mobility: Secondary | ICD-10-CM | POA: Diagnosis not present

## 2013-08-26 DIAGNOSIS — M545 Low back pain: Secondary | ICD-10-CM | POA: Diagnosis not present

## 2013-08-26 DIAGNOSIS — G609 Hereditary and idiopathic neuropathy, unspecified: Secondary | ICD-10-CM | POA: Diagnosis not present

## 2013-08-27 ENCOUNTER — Other Ambulatory Visit: Payer: Self-pay | Admitting: *Deleted

## 2013-08-27 DIAGNOSIS — N185 Chronic kidney disease, stage 5: Secondary | ICD-10-CM

## 2013-08-27 DIAGNOSIS — Z01810 Encounter for preprocedural cardiovascular examination: Secondary | ICD-10-CM

## 2013-08-28 ENCOUNTER — Telehealth (HOSPITAL_COMMUNITY): Payer: Self-pay | Admitting: Dietician

## 2013-08-28 NOTE — Telephone Encounter (Signed)
Pt was a no-show for appointment scheduled for 08/28/2013 at 1400. Sent letter to pt home notifying pt of no-show and requesting rescheduling appointment.

## 2013-09-05 ENCOUNTER — Encounter: Payer: Medicare Other | Admitting: Obstetrics & Gynecology

## 2013-09-09 ENCOUNTER — Encounter: Payer: Medicare Other | Admitting: Obstetrics & Gynecology

## 2013-09-16 ENCOUNTER — Encounter: Payer: Self-pay | Admitting: Vascular Surgery

## 2013-09-16 ENCOUNTER — Encounter: Payer: Medicare Other | Admitting: Obstetrics & Gynecology

## 2013-09-17 ENCOUNTER — Ambulatory Visit (INDEPENDENT_AMBULATORY_CARE_PROVIDER_SITE_OTHER)
Admission: RE | Admit: 2013-09-17 | Discharge: 2013-09-17 | Disposition: A | Discharge status: Home / Self Care | Payer: Medicare Other | Source: Ambulatory Visit | Attending: Vascular Surgery | Admitting: Vascular Surgery

## 2013-09-17 ENCOUNTER — Other Ambulatory Visit: Payer: Self-pay

## 2013-09-17 ENCOUNTER — Ambulatory Visit (INDEPENDENT_AMBULATORY_CARE_PROVIDER_SITE_OTHER): Payer: Medicare Other | Admitting: Vascular Surgery

## 2013-09-17 ENCOUNTER — Ambulatory Visit (HOSPITAL_COMMUNITY)
Admission: RE | Admit: 2013-09-17 | Discharge: 2013-09-17 | Disposition: A | Discharge status: Home / Self Care | Payer: Medicare Other | Source: Ambulatory Visit | Attending: Vascular Surgery | Admitting: Vascular Surgery

## 2013-09-17 ENCOUNTER — Encounter: Payer: Self-pay | Admitting: Vascular Surgery

## 2013-09-17 VITALS — BP 117/69 | HR 63 | Resp 18 | Ht <= 58 in | Wt 192.0 lb

## 2013-09-17 DIAGNOSIS — Z01810 Encounter for preprocedural cardiovascular examination: Secondary | ICD-10-CM

## 2013-09-17 DIAGNOSIS — N185 Chronic kidney disease, stage 5: Secondary | ICD-10-CM

## 2013-09-17 NOTE — Progress Notes (Signed)
Subjective:     Patient ID: Angela Soto, female   DOB: 03-08-1971, 42 y.o.   MRN: [id number]  HPI this 42 year old female is referred for vascular access by Dr.Befecadu. She has never had a hemodialysis. She does have type 1 diabetes mellitus. She has had bilateral below-knee dictations as complications of her diabetes. She is approaching hemodialysis and is right-handed.  Past Medical History  Diagnosis Date  . Diabetes mellitus without complication   . Coronary artery disease   . Hypertension     History  Substance Use Topics  . Smoking status: Current Every Day Smoker    Types: Cigarettes  . Smokeless tobacco: Never Used  . Alcohol Use: No    Family History  Problem Relation Age of Onset  . Diabetes Mellitus II Mother   . Ovarian cancer Mother   . CAD Father   . Diabetes Mellitus II Father     Allergies  Allergen Reactions  . Metformin And Related Nausea And Vomiting  . Lisinopril   . Losartan   . Spironolactone   . Trimethoprim     Current outpatient prescriptions:albuterol (PROVENTIL HFA;VENTOLIN HFA) 108 (90 BASE) MCG/ACT inhaler, Inhale into the lungs every 6 (six) hours as needed for wheezing or shortness of breath., Disp: , Rfl: ;  amLODipine (NORVASC) 10 MG tablet, Take 10 mg by mouth daily., Disp: , Rfl: ;  atorvastatin (LIPITOR) 80 MG tablet, Take 80 mg by mouth at bedtime., Disp: , Rfl:  calcium acetate, Phos Binder, (PHOSLYRA) 667 MG/5ML SOLN, Take 5 mLs (667 mg total) by mouth 3 (three) times daily with meals., Disp: 1350 mL, Rfl: 1;  Carboxymethylcellulose Sodium (THERATEARS) 0.25 % SOLN, Apply 2 drops to eye every 4 (four) hours., Disp: , Rfl: ;  cloNIDine (CATAPRES) 0.2 MG tablet, Take 0.2 mg by mouth 2 (two) times daily., Disp: , Rfl: ;  clopidogrel (PLAVIX) 75 MG tablet, Take 75 mg by mouth daily., Disp: , Rfl:  ergocalciferol (VITAMIN D2) 50000 UNITS capsule, Take 50,000 Units by mouth once a week. fridays, Disp: , Rfl: ;  Fluticasone-Salmeterol (ADVAIR  DISKUS) 250-50 MCG/DOSE AEPB, Inhale 1 puff into the lungs 2 (two) times daily., Disp: , Rfl: ;  FOLIC ACID PO, Take 1 tablet by mouth daily., Disp: , Rfl: ;  furosemide (LASIX) 80 MG tablet, Take 1 tablet (80 mg total) by mouth 2 (two) times daily., Disp: 60 tablet, Rfl: 0 hydrALAZINE (APRESOLINE) 25 MG tablet, Take 25 mg by mouth 3 (three) times daily., Disp: , Rfl: ;  insulin aspart (NOVOLOG) 100 UNIT/ML injection, Inject 14 Units into the skin 3 (three) times daily with meals., Disp: , Rfl: ;  insulin glargine (LANTUS) 100 UNIT/ML injection, Inject 0.3 mLs (30 Units total) into the skin at bedtime., Disp: 10 mL, Rfl: 12 metoprolol (TOPROL-XL) 200 MG 24 hr tablet, Take 200 mg by mouth 2 (two) times daily., Disp: , Rfl: ;  Misc. Devices [facility] CUSHION) MISC, 1 Units by Does not apply route daily., Disp: 1 each, Rfl: 0;  sodium polystyrene (KAYEXALATE) 15 GM/60ML suspension, Take 60 mLs (15 g total) by mouth every other day., Disp: 500 mL, Rfl: 0;  terazosin (HYTRIN) 5 MG capsule, Take 5 mg by mouth 2 (two) times daily., Disp: , Rfl:  HYDROcodone-acetaminophen (NORCO/VICODIN) 5-325 MG per tablet, Take 1 tablet by mouth every 6 (six) hours as needed for pain., Disp: 15 tablet, Rfl: 0;  sodium bicarbonate 650 MG tablet, Take 1 tablet (650 mg total) by mouth 3 (three) times  daily., Disp: 90 tablet, Rfl: 0  BP 117/69  Pulse 63  Resp 18  Ht 4\' 3"  (1.295 m)  Wt 192 lb (87.091 kg)  BMI 51.93 kg/m2  Body mass index is 51.93 kg/(m^2).          Review of Systems denies active chest pain, dyspnea on exertion, PND, orthopnea. He is confined to wheelchair for bilateral below-knee amputations. Denies other symptoms and complete review of systems    Objective:   Physical Exam BP 117/69  Pulse 63  Resp 18  Ht 4\' 3"  (1.295 m)  Wt 192 lb (87.091 kg)  BMI 51.93 kg/m2  Gen.-alert and oriented x3 in no apparent distress HEENT normal for age Lungs no rhonchi or wheezing Cardiovascular regular  rhythm no murmurs carotid pulses 3+ palpable no bruits audible Abdomen soft nontender no palpable masses Musculoskeletal free of  major deformities Skin clear -no rashes Neurologic normal Lower extremities 3+ femoral pulses-bilateral below-knee amputations.-Patient in wheelchair  Today I ordered bilateral vein mapping and upper extremity arterial evaluation. Arterial evaluation is normal with good brisk flow in both radial and ulnar arteries Patient appears to have patent cephalic and basilic veins bilaterally of marginal size. Aselli veins are actually slightly larger than cephalic veins.      Assessment:     Chronic kidney disease-stage IV-needs vascular access Type 1 diabetes mellitus    Plan:     Plan left upper extremity AV fistula by Dr. Oneida Alar on Tuesday, December 9-brachial cephalic fistula versus basilic vein transposition depending on findings in the operating room  Discuss with patient and she understands and accepts

## 2013-09-18 ENCOUNTER — Encounter: Payer: Medicare Other | Admitting: Obstetrics & Gynecology

## 2013-09-18 ENCOUNTER — Encounter: Payer: Self-pay | Admitting: *Deleted

## 2013-09-23 ENCOUNTER — Encounter (HOSPITAL_COMMUNITY): Payer: Self-pay | Admitting: *Deleted

## 2013-09-23 ENCOUNTER — Other Ambulatory Visit (HOSPITAL_COMMUNITY): Payer: Self-pay | Admitting: *Deleted

## 2013-09-23 MED ORDER — DEXTROSE 5 % IV SOLN
1.5000 g | INTRAVENOUS | Status: AC
  Administered 2013-09-24: 1.5 g via INTRAVENOUS
  Filled 2013-09-23: qty 1.5

## 2013-09-23 NOTE — Progress Notes (Signed)
Requested CXR from [facility]. All of patient's cardiac and vascular surgeries done in [place]. Most recent cath/stent was in 2005 in [place]. States she has not had any recent chest pain and cardiac issues.

## 2013-09-24 ENCOUNTER — Encounter (HOSPITAL_COMMUNITY): Payer: Self-pay

## 2013-09-24 ENCOUNTER — Encounter (HOSPITAL_COMMUNITY): Payer: Medicare Other | Admitting: Certified Registered Nurse Anesthetist

## 2013-09-24 ENCOUNTER — Ambulatory Visit (HOSPITAL_COMMUNITY): Payer: Medicare Other | Admitting: Certified Registered Nurse Anesthetist

## 2013-09-24 ENCOUNTER — Ambulatory Visit (HOSPITAL_COMMUNITY)
Admission: RE | Admit: 2013-09-24 | Discharge: 2013-09-24 | Disposition: A | Discharge status: Home / Self Care | Payer: Medicare Other | Source: Ambulatory Visit | Attending: Vascular Surgery | Admitting: Vascular Surgery

## 2013-09-24 ENCOUNTER — Ambulatory Visit (HOSPITAL_COMMUNITY): Payer: Medicare Other

## 2013-09-24 ENCOUNTER — Encounter (HOSPITAL_COMMUNITY)
Admission: RE | Disposition: A | Discharge status: Home / Self Care | Payer: Self-pay | Source: Ambulatory Visit | Attending: Vascular Surgery

## 2013-09-24 DIAGNOSIS — I252 Old myocardial infarction: Secondary | ICD-10-CM | POA: Diagnosis not present

## 2013-09-24 DIAGNOSIS — I739 Peripheral vascular disease, unspecified: Secondary | ICD-10-CM | POA: Insufficient documentation

## 2013-09-24 DIAGNOSIS — E109 Type 1 diabetes mellitus without complications: Secondary | ICD-10-CM | POA: Diagnosis not present

## 2013-09-24 DIAGNOSIS — Z993 Dependence on wheelchair: Secondary | ICD-10-CM | POA: Insufficient documentation

## 2013-09-24 DIAGNOSIS — F17200 Nicotine dependence, unspecified, uncomplicated: Secondary | ICD-10-CM | POA: Insufficient documentation

## 2013-09-24 DIAGNOSIS — N186 End stage renal disease: Secondary | ICD-10-CM | POA: Diagnosis not present

## 2013-09-24 DIAGNOSIS — I2510 Atherosclerotic heart disease of native coronary artery without angina pectoris: Secondary | ICD-10-CM | POA: Insufficient documentation

## 2013-09-24 DIAGNOSIS — S88119A Complete traumatic amputation at level between knee and ankle, unspecified lower leg, initial encounter: Secondary | ICD-10-CM | POA: Insufficient documentation

## 2013-09-24 DIAGNOSIS — Z794 Long term (current) use of insulin: Secondary | ICD-10-CM | POA: Insufficient documentation

## 2013-09-24 DIAGNOSIS — R51 Headache: Secondary | ICD-10-CM | POA: Diagnosis not present

## 2013-09-24 DIAGNOSIS — I129 Hypertensive chronic kidney disease with stage 1 through stage 4 chronic kidney disease, or unspecified chronic kidney disease: Secondary | ICD-10-CM | POA: Diagnosis not present

## 2013-09-24 DIAGNOSIS — N184 Chronic kidney disease, stage 4 (severe): Secondary | ICD-10-CM | POA: Insufficient documentation

## 2013-09-24 DIAGNOSIS — N185 Chronic kidney disease, stage 5: Secondary | ICD-10-CM

## 2013-09-24 DIAGNOSIS — I10 Essential (primary) hypertension: Secondary | ICD-10-CM | POA: Insufficient documentation

## 2013-09-24 HISTORY — DX: Headache: R51

## 2013-09-24 HISTORY — DX: Other allergy status, other than to drugs and biological substances: Z91.09

## 2013-09-24 HISTORY — PX: AV FISTULA PLACEMENT: SHX1204

## 2013-09-24 HISTORY — DX: Multiple sclerosis: G35

## 2013-09-24 HISTORY — DX: Peripheral vascular disease, unspecified: I73.9

## 2013-09-24 HISTORY — DX: Umbilical hernia without obstruction or gangrene: K42.9

## 2013-09-24 HISTORY — DX: Acute myocardial infarction, unspecified: I21.9

## 2013-09-24 LAB — POCT I-STAT 4, (NA,K, GLUC, HGB,HCT)
Glucose, Bld: 168 mg/dL — ABNORMAL HIGH (ref 70–99)
HCT: 38 % (ref 36.0–46.0)
Potassium: 4.2 mEq/L (ref 3.5–5.1)

## 2013-09-24 LAB — GLUCOSE, CAPILLARY
Glucose-Capillary: 177 mg/dL — ABNORMAL HIGH (ref 70–99)
Glucose-Capillary: 163 mg/dL — ABNORMAL HIGH (ref 70–99)
Glucose-Capillary: 140 mg/dL — ABNORMAL HIGH (ref 70–99)

## 2013-09-24 SURGERY — ARTERIOVENOUS (AV) FISTULA CREATION
Anesthesia: Monitor Anesthesia Care | Site: Arm Upper | Laterality: Left

## 2013-09-24 MED ORDER — THROMBIN 20000 UNITS EX SOLR
CUTANEOUS | Status: AC
  Filled 2013-09-24: qty 20000

## 2013-09-24 MED ORDER — SODIUM CHLORIDE 0.9 % IV SOLN
INTRAVENOUS | Status: DC | PRN
  Administered 2013-09-24: 12:00:00 via INTRAVENOUS

## 2013-09-24 MED ORDER — METOCLOPRAMIDE HCL 5 MG/ML IJ SOLN: 10.0000 mg | Freq: Once | INTRAMUSCULAR | Status: DC | PRN

## 2013-09-24 MED ORDER — PROTAMINE SULFATE 10 MG/ML IV SOLN
INTRAVENOUS | Status: DC | PRN
  Administered 2013-09-24: 50 mg via INTRAVENOUS

## 2013-09-24 MED ORDER — MIDAZOLAM HCL 5 MG/5ML IJ SOLN
INTRAMUSCULAR | Status: DC | PRN
  Administered 2013-09-24 (×2): 1 mg via INTRAVENOUS

## 2013-09-24 MED ORDER — OXYCODONE-ACETAMINOPHEN 5-325 MG PO TABS: 1.0000 | ORAL_TABLET | Freq: Four times a day (QID) | ORAL | Status: DC | PRN

## 2013-09-24 MED ORDER — 0.9 % SODIUM CHLORIDE (POUR BTL) OPTIME
TOPICAL | Status: DC | PRN
  Administered 2013-09-24: 1000 mL

## 2013-09-24 MED ORDER — PROPOFOL INFUSION 10 MG/ML OPTIME
INTRAVENOUS | Status: DC | PRN
  Administered 2013-09-24: 25 ug/kg/min via INTRAVENOUS

## 2013-09-24 MED ORDER — LIDOCAINE HCL (PF) 1 % IJ SOLN
INTRAMUSCULAR | Status: AC
  Filled 2013-09-24: qty 30

## 2013-09-24 MED ORDER — LIDOCAINE HCL (PF) 1 % IJ SOLN
INTRAMUSCULAR | Status: DC | PRN
  Administered 2013-09-24: 30 mL

## 2013-09-24 MED ORDER — ONDANSETRON HCL 4 MG/2ML IJ SOLN
INTRAMUSCULAR | Status: DC | PRN
  Administered 2013-09-24: 4 mg via INTRAVENOUS

## 2013-09-24 MED ORDER — SODIUM CHLORIDE 0.9 % IR SOLN
Status: DC | PRN
  Administered 2013-09-24: 12:00:00

## 2013-09-24 MED ORDER — OXYCODONE HCL 5 MG/5ML PO SOLN: 5.0000 mg | Freq: Once | ORAL | Status: DC | PRN

## 2013-09-24 MED ORDER — HEPARIN SODIUM (PORCINE) 1000 UNIT/ML IJ SOLN
INTRAMUSCULAR | Status: DC | PRN
  Administered 2013-09-24: 5000 [IU] via INTRAVENOUS

## 2013-09-24 MED ORDER — FENTANYL CITRATE 0.05 MG/ML IJ SOLN: 25.0000 ug | INTRAMUSCULAR | Status: DC | PRN

## 2013-09-24 MED ORDER — OXYCODONE HCL 5 MG PO TABS: 5.0000 mg | ORAL_TABLET | Freq: Once | ORAL | Status: DC | PRN

## 2013-09-24 MED ORDER — FENTANYL CITRATE 0.05 MG/ML IJ SOLN
INTRAMUSCULAR | Status: DC | PRN
  Administered 2013-09-24: 25 ug via INTRAVENOUS
  Administered 2013-09-24: 50 ug via INTRAVENOUS
  Administered 2013-09-24: 25 ug via INTRAVENOUS

## 2013-09-24 MED ORDER — SODIUM CHLORIDE 0.9 % IV SOLN
INTRAVENOUS | Status: DC
  Administered 2013-09-24: 10:00:00 via INTRAVENOUS

## 2013-09-24 SURGICAL SUPPLY — 41 items
ARMBAND PINK RESTRICT EXTREMIT (MISCELLANEOUS) ×2 IMPLANT
CANISTER SUCTION 2500CC (MISCELLANEOUS) ×2 IMPLANT
CLIP TI MEDIUM 6 (CLIP) ×2 IMPLANT
CLIP TI WIDE RED SMALL 6 (CLIP) ×2 IMPLANT
COVER PROBE W GEL 5X96 (DRAPES) ×2 IMPLANT
COVER SURGICAL LIGHT HANDLE (MISCELLANEOUS) ×2 IMPLANT
DECANTER SPIKE VIAL GLASS SM (MISCELLANEOUS) ×2 IMPLANT
DERMABOND ADVANCED (GAUZE/BANDAGES/DRESSINGS) ×1
DERMABOND ADVANCED .7 DNX12 (GAUZE/BANDAGES/DRESSINGS) ×1 IMPLANT
DRAIN PENROSE 1/4X12 LTX STRL (WOUND CARE) ×2 IMPLANT
ELECT REM PT RETURN 9FT ADLT (ELECTROSURGICAL) ×2
ELECTRODE REM PT RTRN 9FT ADLT (ELECTROSURGICAL) ×1 IMPLANT
GEL ULTRASOUND 20GR AQUASONIC (MISCELLANEOUS) IMPLANT
GLOVE BIO SURGEON STRL SZ7.5 (GLOVE) ×2 IMPLANT
GLOVE BIOGEL PI IND STRL 6.5 (GLOVE) ×2 IMPLANT
GLOVE BIOGEL PI IND STRL 7.0 (GLOVE) ×1 IMPLANT
GLOVE BIOGEL PI IND STRL 7.5 (GLOVE) ×1 IMPLANT
GLOVE BIOGEL PI INDICATOR 6.5 (GLOVE) ×2
GLOVE BIOGEL PI INDICATOR 7.0 (GLOVE) ×1
GLOVE BIOGEL PI INDICATOR 7.5 (GLOVE) ×1
GLOVE ECLIPSE 6.5 STRL STRAW (GLOVE) ×2 IMPLANT
GLOVE SS BIOGEL STRL SZ 7 (GLOVE) ×1 IMPLANT
GLOVE SUPERSENSE BIOGEL SZ 7 (GLOVE) ×1
GOWN PREVENTION PLUS XLARGE (GOWN DISPOSABLE) ×2 IMPLANT
GOWN STRL NON-REIN LRG LVL3 (GOWN DISPOSABLE) ×4 IMPLANT
KIT BASIN OR (CUSTOM PROCEDURE TRAY) ×2 IMPLANT
KIT ROOM TURNOVER OR (KITS) ×2 IMPLANT
LOOP VESSEL MINI RED (MISCELLANEOUS) IMPLANT
NEEDLE HYPO 25GX1X1/2 BEV (NEEDLE) ×2 IMPLANT
NS IRRIG 1000ML POUR BTL (IV SOLUTION) ×2 IMPLANT
PACK CV ACCESS (CUSTOM PROCEDURE TRAY) ×2 IMPLANT
PAD ARMBOARD 7.5X6 YLW CONV (MISCELLANEOUS) ×4 IMPLANT
SPONGE SURGIFOAM ABS GEL 100 (HEMOSTASIS) IMPLANT
SUT PROLENE 7 0 BV 1 (SUTURE) ×4 IMPLANT
SUT VIC AB 3-0 SH 27 (SUTURE) ×1
SUT VIC AB 3-0 SH 27X BRD (SUTURE) ×1 IMPLANT
SUT VICRYL 4-0 PS2 18IN ABS (SUTURE) ×2 IMPLANT
TOWEL OR 17X24 6PK STRL BLUE (TOWEL DISPOSABLE) ×2 IMPLANT
TOWEL OR 17X26 10 PK STRL BLUE (TOWEL DISPOSABLE) ×2 IMPLANT
UNDERPAD 30X30 INCONTINENT (UNDERPADS AND DIAPERS) ×2 IMPLANT
WATER STERILE IRR 1000ML POUR (IV SOLUTION) ×2 IMPLANT

## 2013-09-24 NOTE — Preoperative (Signed)
Beta Blockers   Reason not to administer Beta Blockers:Not Applicable 

## 2013-09-24 NOTE — H&P (View-Only) (Signed)
Subjective:     Patient ID: Angela Soto, female   DOB: 1971/08/25, 42 y.o.   MRN: [id number]  HPI this 42 year old female is referred for vascular access by Dr.Befecadu. She has never had a hemodialysis. She does have type 1 diabetes mellitus. She has had bilateral below-knee dictations as complications of her diabetes. She is approaching hemodialysis and is right-handed.  Past Medical History  Diagnosis Date  . Diabetes mellitus without complication   . Coronary artery disease   . Hypertension     History  Substance Use Topics  . Smoking status: Current Every Day Smoker    Types: Cigarettes  . Smokeless tobacco: Never Used  . Alcohol Use: No    Family History  Problem Relation Age of Onset  . Diabetes Mellitus II Mother   . Ovarian cancer Mother   . CAD Father   . Diabetes Mellitus II Father     Allergies  Allergen Reactions  . Metformin And Related Nausea And Vomiting  . Lisinopril   . Losartan   . Spironolactone   . Trimethoprim     Current outpatient prescriptions:albuterol (PROVENTIL HFA;VENTOLIN HFA) 108 (90 BASE) MCG/ACT inhaler, Inhale into the lungs every 6 (six) hours as needed for wheezing or shortness of breath., Disp: , Rfl: ;  amLODipine (NORVASC) 10 MG tablet, Take 10 mg by mouth daily., Disp: , Rfl: ;  atorvastatin (LIPITOR) 80 MG tablet, Take 80 mg by mouth at bedtime., Disp: , Rfl:  calcium acetate, Phos Binder, (PHOSLYRA) 667 MG/5ML SOLN, Take 5 mLs (667 mg total) by mouth 3 (three) times daily with meals., Disp: 1350 mL, Rfl: 1;  Carboxymethylcellulose Sodium (THERATEARS) 0.25 % SOLN, Apply 2 drops to eye every 4 (four) hours., Disp: , Rfl: ;  cloNIDine (CATAPRES) 0.2 MG tablet, Take 0.2 mg by mouth 2 (two) times daily., Disp: , Rfl: ;  clopidogrel (PLAVIX) 75 MG tablet, Take 75 mg by mouth daily., Disp: , Rfl:  ergocalciferol (VITAMIN D2) 50000 UNITS capsule, Take 50,000 Units by mouth once a week. fridays, Disp: , Rfl: ;  Fluticasone-Salmeterol (ADVAIR  DISKUS) 250-50 MCG/DOSE AEPB, Inhale 1 puff into the lungs 2 (two) times daily., Disp: , Rfl: ;  FOLIC ACID PO, Take 1 tablet by mouth daily., Disp: , Rfl: ;  furosemide (LASIX) 80 MG tablet, Take 1 tablet (80 mg total) by mouth 2 (two) times daily., Disp: 60 tablet, Rfl: 0 hydrALAZINE (APRESOLINE) 25 MG tablet, Take 25 mg by mouth 3 (three) times daily., Disp: , Rfl: ;  insulin aspart (NOVOLOG) 100 UNIT/ML injection, Inject 14 Units into the skin 3 (three) times daily with meals., Disp: , Rfl: ;  insulin glargine (LANTUS) 100 UNIT/ML injection, Inject 0.3 mLs (30 Units total) into the skin at bedtime., Disp: 10 mL, Rfl: 12 metoprolol (TOPROL-XL) 200 MG 24 hr tablet, Take 200 mg by mouth 2 (two) times daily., Disp: , Rfl: ;  Misc. Devices [facility] CUSHION) MISC, 1 Units by Does not apply route daily., Disp: 1 each, Rfl: 0;  sodium polystyrene (KAYEXALATE) 15 GM/60ML suspension, Take 60 mLs (15 g total) by mouth every other day., Disp: 500 mL, Rfl: 0;  terazosin (HYTRIN) 5 MG capsule, Take 5 mg by mouth 2 (two) times daily., Disp: , Rfl:  HYDROcodone-acetaminophen (NORCO/VICODIN) 5-325 MG per tablet, Take 1 tablet by mouth every 6 (six) hours as needed for pain., Disp: 15 tablet, Rfl: 0;  sodium bicarbonate 650 MG tablet, Take 1 tablet (650 mg total) by mouth 3 (three) times  daily., Disp: 90 tablet, Rfl: 0  BP 117/69  Pulse 63  Resp 18  Ht 4\' 3"  (1.295 m)  Wt 192 lb (87.091 kg)  BMI 51.93 kg/m2  Body mass index is 51.93 kg/(m^2).          Review of Systems denies active chest pain, dyspnea on exertion, PND, orthopnea. He is confined to wheelchair for bilateral below-knee amputations. Denies other symptoms and complete review of systems    Objective:   Physical Exam BP 117/69  Pulse 63  Resp 18  Ht 4\' 3"  (1.295 m)  Wt 192 lb (87.091 kg)  BMI 51.93 kg/m2  Gen.-alert and oriented x3 in no apparent distress HEENT normal for age Lungs no rhonchi or wheezing Cardiovascular regular  rhythm no murmurs carotid pulses 3+ palpable no bruits audible Abdomen soft nontender no palpable masses Musculoskeletal free of  major deformities Skin clear -no rashes Neurologic normal Lower extremities 3+ femoral pulses-bilateral below-knee amputations.-Patient in wheelchair  Today I ordered bilateral vein mapping and upper extremity arterial evaluation. Arterial evaluation is normal with good brisk flow in both radial and ulnar arteries Patient appears to have patent cephalic and basilic veins bilaterally of marginal size. Aselli veins are actually slightly larger than cephalic veins.      Assessment:     Chronic kidney disease-stage IV-needs vascular access Type 1 diabetes mellitus    Plan:     Plan left upper extremity AV fistula by Dr. Oneida Alar on Tuesday, December 9-brachial cephalic fistula versus basilic vein transposition depending on findings in the operating room  Discuss with patient and she understands and accepts

## 2013-09-24 NOTE — Interval H&P Note (Signed)
History and Physical Interval Note:  09/24/2013 12:11 PM  Angela Soto  has presented today for surgery, with the diagnosis of End Stage Renal Disease  The various methods of treatment have been discussed with the patient and family. After consideration of risks, benefits and other options for treatment, the patient has consented to  Procedure(s): LEFT UPPER EXTREMITY ARTERIOVENOUS (AV) FISTULA CREATION/ BRACHIAL-CEPHALIC VS BASILIC VEIN TRANSPOSITION (Left) as a surgical intervention .  The patient's history has been reviewed, patient examined, no change in status, stable for surgery.  I have reviewed the patient's chart and labs.  Questions were answered to the patient's satisfaction.     FIELDS,CHARLES E

## 2013-09-24 NOTE — Anesthesia Preprocedure Evaluation (Addendum)
Anesthesia Evaluation  Patient identified by MRN, date of birth, ID band Patient awake    Reviewed: Allergy & Precautions, H&P , NPO status , Patient's Chart, lab work & pertinent test results, reviewed documented beta blocker date and time   Airway Mallampati: II TM Distance: >3 FB Neck ROM: full    Dental  (+) Upper Dentures   Pulmonary Current Smoker,  breath sounds clear to auscultation        Cardiovascular hypertension, On Medications and On Home Beta Blockers + CAD, + Past MI, + Cardiac Stents and + Peripheral Vascular Disease Rhythm:regular     Neuro/Psych  Headaches, negative psych ROS   GI/Hepatic negative GI ROS, Neg liver ROS,   Endo/Other  negative endocrine ROSdiabetes, Insulin Dependent  Renal/GU ESRFRenal disease  negative genitourinary   Musculoskeletal   Abdominal   Peds  Hematology negative hematology ROS (+)   Anesthesia Other Findings See surgeon's H&P   Reproductive/Obstetrics negative OB ROS                          Anesthesia Physical Anesthesia Plan  ASA: III  Anesthesia Plan: MAC   Post-op Pain Management:    Induction: Intravenous  Airway Management Planned: Simple Face Mask  Additional Equipment:   Intra-op Plan:   Post-operative Plan:   Informed Consent: I have reviewed the patients History and Physical, chart, labs and discussed the procedure including the risks, benefits and alternatives for the proposed anesthesia with the patient or authorized representative who has indicated his/her understanding and acceptance.   Dental Advisory Given  Plan Discussed with: CRNA and Surgeon  Anesthesia Plan Comments:         Anesthesia Quick Evaluation

## 2013-09-24 NOTE — Op Note (Signed)
Procedure: Left basilic vein transposition fistula first stage, Ultrasound guidance  Preoperative diagnosis: End-stage renal disease  Postoperative diagnosis: Same  Anesthesia: Gen.  Assistant: Gerri Lins, [facility]  Operative findings: 3 mm left basilic vein, 3 mm left brachial artery  Operative details: After obtaining informed consent, the patient was taken to the operating room. The patient was placed in supine position operating table. After induction of general anesthesia, the patient's entire left upper extremity was prepped and draped in the usual sterile fashion. Ultrasound was used to examine the cephalic vein.  This was too small to identify.  Next ultrasound was used to identify the left basilic vein.  It was about 3 mm diameter and compressible.  A longitudinal incision was made just above the antecubital crease in order to expose the basilic vein. The vein was of good quality approximately 3 mm in diameter.  The area of the cephalic vein was explored and no useable vein was found.  Care was taken to try to not injure any sensory nerves and all the motor nerves were identified and protected. The vein was dissected free circumferentially and small side branches ligated and divided between silk ties or clips. Next the brachial artery was exposed by deepening the basilic vein harvest incision just above the antecubital crease. The artery was approximately 3 mm in diameter with some thickening. This was dissected free circumferentially. Vessel loops were placed around it. Next the distal basilic vein was ligated with a 2 silk tie and the vein transected.  The patient was given 5000 units of intravenous heparin. Vessel loops were used to control the artery proximally and distally. A longitudinal arteriotomy was made with an 11 blade.  The vein was cut to length and sewn end of vein to side of artery using a running 7-0 Prolene suture. Just prior to completion of the anastomosis, it was forebled  backbled and thoroughly flushed. The anastomosis was secured and the Vesseloops were released.  There was a palpable thrill in the proximal aspect of the fistula. There was also good Doppler flow throughout the course of the fistula. At this point all subcutaneous tissues were reapproximated using running 3-0 Vicryl suture. All skin incisions were closed with running 4  0 Vicryl subcuticular stitch. Dermabond was applied to all incisions. The patient tolerated the procedure well and there were no complications. Instrument sponge needle counts were correct at the end of the case. The patient was taken to the recovery room in stable condition.  The patient had a radial doppler signal at the end of the case.  Ruta Hinds, MD [facility] Office: [phone] Pager: [phone]

## 2013-09-24 NOTE — Transfer of Care (Signed)
Immediate Anesthesia Transfer of Care Note  Patient: Angela Soto  Procedure(s) Performed: Procedure(s): LEFT UPPER EXTREMITY ARTERIOVENOUS (AV) FISTULA CREATION BRACHIAL/CEPHALIC (Left)  Patient Location: PACU  Anesthesia Type:MAC  Level of Consciousness: awake, alert  and oriented  Airway & Oxygen Therapy: Patient Spontanous Breathing  Post-op Assessment: Report given to PACU RN  Post vital signs: Reviewed and stable  Complications: No apparent anesthesia complications

## 2013-09-25 ENCOUNTER — Telehealth: Payer: Self-pay | Admitting: Vascular Surgery

## 2013-09-25 NOTE — Anesthesia Postprocedure Evaluation (Signed)
Anesthesia Post Note  Patient: Angela Soto  Procedure(s) Performed: Procedure(s) (LRB): LEFT UPPER EXTREMITY ARTERIOVENOUS (AV) FISTULA CREATION BRACHIAL/CEPHALIC (Left)  Anesthesia type: MAC  Patient location: PACU  Post pain: Pain level controlled  Post assessment: Patient's Cardiovascular Status Stable  Last Vitals:  Filed Vitals:   09/24/13 1421  BP: 135/71  Pulse: 66  Temp:   Resp: 15    Post vital signs: Reviewed and stable  Level of consciousness: alert  Complications: No apparent anesthesia complications

## 2013-09-25 NOTE — Telephone Encounter (Addendum)
Message copied by Gena Fray on Wed Sep 25, 2013 10:03 AM ------      Message from: [place], [place] M      Created: Tue Sep 24, 2013  1:52 PM       F/U with Dr. Oneida Alar 4-5 weeks first stage fistula left. ------  09/25/13: vm box has not been set up, mailed letter, dpm

## 2013-09-26 ENCOUNTER — Encounter (HOSPITAL_COMMUNITY): Payer: Self-pay | Admitting: Vascular Surgery

## 2013-09-30 DIAGNOSIS — N189 Chronic kidney disease, unspecified: Secondary | ICD-10-CM | POA: Diagnosis not present

## 2013-09-30 DIAGNOSIS — I129 Hypertensive chronic kidney disease with stage 1 through stage 4 chronic kidney disease, or unspecified chronic kidney disease: Secondary | ICD-10-CM | POA: Diagnosis not present

## 2013-09-30 DIAGNOSIS — Z79899 Other long term (current) drug therapy: Secondary | ICD-10-CM | POA: Diagnosis not present

## 2013-09-30 DIAGNOSIS — D649 Anemia, unspecified: Secondary | ICD-10-CM | POA: Diagnosis not present

## 2013-09-30 DIAGNOSIS — R809 Proteinuria, unspecified: Secondary | ICD-10-CM | POA: Diagnosis not present

## 2013-10-02 ENCOUNTER — Telehealth: Payer: Self-pay

## 2013-10-02 DIAGNOSIS — N186 End stage renal disease: Secondary | ICD-10-CM | POA: Diagnosis not present

## 2013-10-02 NOTE — Telephone Encounter (Signed)
Phone call from [place], @ Dr. Florentina Addison office.  Requesting that pt. Have a permcath. Placed on Monday, 10/07/13, due to need to start dialysis.  Will schedule pt. For outpatient procedure for Insertion Dialysis Catheter per Dr. Bridgett Larsson on 10/07/13/ Will contact pt. And give pre-op instructions via phone.

## 2013-10-03 NOTE — Progress Notes (Addendum)
Call placed to give pre-op instructions to, except "the Voice Mail Box has not been set up"....will try again later.  DA I also placed a call in to Barron Schmid, RN at [facility] (had to leave a message).   DA

## 2013-10-04 ENCOUNTER — Other Ambulatory Visit: Payer: Self-pay

## 2013-10-04 ENCOUNTER — Encounter (HOSPITAL_COMMUNITY): Payer: Self-pay | Admitting: *Deleted

## 2013-10-04 NOTE — Progress Notes (Signed)
Pt notified of time change;to arrive at 0630 but states that her transportation can't get her here that time;pt to call office to discuss

## 2013-10-04 NOTE — Telephone Encounter (Signed)
Rec'd return call from pt.  Pre-op instructions given.  Verb. understanding.  Advised to arrive at [place]. @ [facility] @ 7:00 AM for procedure.

## 2013-10-06 MED ORDER — DEXTROSE 5 % IV SOLN
1.5000 g | INTRAVENOUS | Status: AC
  Administered 2013-10-07: 1.5 g via INTRAVENOUS
  Filled 2013-10-06: qty 1.5

## 2013-10-07 ENCOUNTER — Ambulatory Visit: Payer: Medicare Other | Admitting: Family Medicine

## 2013-10-07 ENCOUNTER — Encounter (HOSPITAL_COMMUNITY): Payer: Medicare Other | Admitting: Certified Registered Nurse Anesthetist

## 2013-10-07 ENCOUNTER — Ambulatory Visit (HOSPITAL_COMMUNITY): Payer: Medicare Other | Admitting: Certified Registered Nurse Anesthetist

## 2013-10-07 ENCOUNTER — Ambulatory Visit (HOSPITAL_COMMUNITY)
Admission: RE | Admit: 2013-10-07 | Discharge: 2013-10-07 | Disposition: A | Discharge status: Home / Self Care | Payer: Medicare Other | Source: Ambulatory Visit | Attending: Vascular Surgery | Admitting: Vascular Surgery

## 2013-10-07 ENCOUNTER — Ambulatory Visit (HOSPITAL_COMMUNITY): Payer: Medicare Other

## 2013-10-07 ENCOUNTER — Encounter (HOSPITAL_COMMUNITY)
Admission: RE | Disposition: A | Discharge status: Home / Self Care | Payer: Self-pay | Source: Ambulatory Visit | Attending: Vascular Surgery

## 2013-10-07 ENCOUNTER — Encounter (HOSPITAL_COMMUNITY): Payer: Self-pay | Admitting: *Deleted

## 2013-10-07 DIAGNOSIS — I252 Old myocardial infarction: Secondary | ICD-10-CM | POA: Insufficient documentation

## 2013-10-07 DIAGNOSIS — Z79899 Other long term (current) drug therapy: Secondary | ICD-10-CM | POA: Diagnosis not present

## 2013-10-07 DIAGNOSIS — I2510 Atherosclerotic heart disease of native coronary artery without angina pectoris: Secondary | ICD-10-CM | POA: Insufficient documentation

## 2013-10-07 DIAGNOSIS — I739 Peripheral vascular disease, unspecified: Secondary | ICD-10-CM | POA: Insufficient documentation

## 2013-10-07 DIAGNOSIS — Z9889 Other specified postprocedural states: Secondary | ICD-10-CM | POA: Diagnosis not present

## 2013-10-07 DIAGNOSIS — I120 Hypertensive chronic kidney disease with stage 5 chronic kidney disease or end stage renal disease: Secondary | ICD-10-CM | POA: Diagnosis not present

## 2013-10-07 DIAGNOSIS — Z794 Long term (current) use of insulin: Secondary | ICD-10-CM | POA: Insufficient documentation

## 2013-10-07 DIAGNOSIS — R918 Other nonspecific abnormal finding of lung field: Secondary | ICD-10-CM | POA: Diagnosis not present

## 2013-10-07 DIAGNOSIS — E109 Type 1 diabetes mellitus without complications: Secondary | ICD-10-CM | POA: Insufficient documentation

## 2013-10-07 DIAGNOSIS — G35 Multiple sclerosis: Secondary | ICD-10-CM | POA: Diagnosis not present

## 2013-10-07 DIAGNOSIS — N289 Disorder of kidney and ureter, unspecified: Secondary | ICD-10-CM

## 2013-10-07 DIAGNOSIS — Z452 Encounter for adjustment and management of vascular access device: Secondary | ICD-10-CM | POA: Insufficient documentation

## 2013-10-07 DIAGNOSIS — N186 End stage renal disease: Secondary | ICD-10-CM | POA: Insufficient documentation

## 2013-10-07 DIAGNOSIS — S88119A Complete traumatic amputation at level between knee and ankle, unspecified lower leg, initial encounter: Secondary | ICD-10-CM | POA: Diagnosis not present

## 2013-10-07 DIAGNOSIS — N19 Unspecified kidney failure: Secondary | ICD-10-CM

## 2013-10-07 DIAGNOSIS — I10 Essential (primary) hypertension: Secondary | ICD-10-CM | POA: Diagnosis not present

## 2013-10-07 DIAGNOSIS — E119 Type 2 diabetes mellitus without complications: Secondary | ICD-10-CM | POA: Diagnosis not present

## 2013-10-07 HISTORY — PX: INSERTION OF DIALYSIS CATHETER: SHX1324

## 2013-10-07 LAB — POCT I-STAT 4, (NA,K, GLUC, HGB,HCT)
Glucose, Bld: 125 mg/dL — ABNORMAL HIGH (ref 70–99)
HCT: 39 % (ref 36.0–46.0)
Potassium: 4.4 mEq/L (ref 3.5–5.1)
Sodium: 135 mEq/L (ref 135–145)

## 2013-10-07 LAB — GLUCOSE, CAPILLARY: Glucose-Capillary: 141 mg/dL — ABNORMAL HIGH (ref 70–99)

## 2013-10-07 SURGERY — INSERTION OF DIALYSIS CATHETER
Anesthesia: Monitor Anesthesia Care | Site: Neck | Laterality: Right

## 2013-10-07 MED ORDER — HEPARIN SODIUM (PORCINE) 1000 UNIT/ML IJ SOLN
INTRAMUSCULAR | Status: AC
  Filled 2013-10-07: qty 1

## 2013-10-07 MED ORDER — SODIUM CHLORIDE 0.9 % IR SOLN
Status: DC | PRN
  Administered 2013-10-07: 09:00:00

## 2013-10-07 MED ORDER — HYDROMORPHONE HCL PF 1 MG/ML IJ SOLN
0.2500 mg | INTRAMUSCULAR | Status: DC | PRN
  Administered 2013-10-07 (×2): 0.5 mg via INTRAVENOUS

## 2013-10-07 MED ORDER — HEPARIN SODIUM (PORCINE) 1000 UNIT/ML IJ SOLN
INTRAMUSCULAR | Status: DC | PRN
  Administered 2013-10-07: 4.6 mL

## 2013-10-07 MED ORDER — SODIUM CHLORIDE 0.9 % IR SOLN
Status: DC | PRN
  Administered 2013-10-07: 1000 mL

## 2013-10-07 MED ORDER — HYDROMORPHONE HCL PF 1 MG/ML IJ SOLN
INTRAMUSCULAR | Status: AC
  Filled 2013-10-07: qty 1

## 2013-10-07 MED ORDER — LIDOCAINE-EPINEPHRINE (PF) 1 %-1:200000 IJ SOLN
INTRAMUSCULAR | Status: DC | PRN
  Administered 2013-10-07: 24 mL via INTRADERMAL

## 2013-10-07 MED ORDER — LIDOCAINE-EPINEPHRINE (PF) 1 %-1:200000 IJ SOLN
INTRAMUSCULAR | Status: AC
  Filled 2013-10-07: qty 10

## 2013-10-07 MED ORDER — MIDAZOLAM HCL 5 MG/5ML IJ SOLN
INTRAMUSCULAR | Status: DC | PRN
  Administered 2013-10-07 (×2): 1 mg via INTRAVENOUS

## 2013-10-07 MED ORDER — SODIUM CHLORIDE 0.9 % IV SOLN
INTRAVENOUS | Status: DC
  Administered 2013-10-07: 35 mL/h via INTRAVENOUS

## 2013-10-07 MED ORDER — OXYCODONE HCL 5 MG PO TABS
ORAL_TABLET | ORAL | Status: AC
  Filled 2013-10-07: qty 1

## 2013-10-07 MED ORDER — OXYCODONE HCL 5 MG PO TABS: 5.0000 mg | ORAL_TABLET | ORAL | Status: DC | PRN

## 2013-10-07 MED ORDER — LIDOCAINE HCL (CARDIAC) 20 MG/ML IV SOLN
INTRAVENOUS | Status: DC | PRN
  Administered 2013-10-07: 60 mg via INTRAVENOUS

## 2013-10-07 MED ORDER — FENTANYL CITRATE 0.05 MG/ML IJ SOLN
INTRAMUSCULAR | Status: DC | PRN
  Administered 2013-10-07: 25 ug via INTRAVENOUS
  Administered 2013-10-07: 50 ug via INTRAVENOUS
  Administered 2013-10-07: 25 ug via INTRAVENOUS

## 2013-10-07 MED ORDER — OXYCODONE HCL 5 MG PO TABS
5.0000 mg | ORAL_TABLET | Freq: Once | ORAL | Status: AC
  Administered 2013-10-07: 5 mg via ORAL

## 2013-10-07 MED ORDER — PROPOFOL INFUSION 10 MG/ML OPTIME
INTRAVENOUS | Status: DC | PRN
  Administered 2013-10-07: 100 ug/kg/min via INTRAVENOUS

## 2013-10-07 MED ORDER — SODIUM CHLORIDE 0.9 % IV SOLN
INTRAVENOUS | Status: DC | PRN
  Administered 2013-10-07: 09:00:00 via INTRAVENOUS

## 2013-10-07 SURGICAL SUPPLY — 46 items
BAG DECANTER FOR FLEXI CONT (MISCELLANEOUS) ×2 IMPLANT
CATH CANNON HEMO 15F 50CM (CATHETERS) IMPLANT
CATH CANNON HEMO 15FR 19 (HEMODIALYSIS SUPPLIES) IMPLANT
CATH CANNON HEMO 15FR 23CM (HEMODIALYSIS SUPPLIES) ×2 IMPLANT
CATH CANNON HEMO 15FR 31CM (HEMODIALYSIS SUPPLIES) IMPLANT
CATH CANNON HEMO 15FR 32CM (HEMODIALYSIS SUPPLIES) IMPLANT
CATH STRAIGHT 5FR 65CM (CATHETERS) IMPLANT
COVER PROBE W GEL 5X96 (DRAPES) ×2 IMPLANT
COVER SURGICAL LIGHT HANDLE (MISCELLANEOUS) ×2 IMPLANT
DERMABOND ADVANCED (GAUZE/BANDAGES/DRESSINGS) ×1
DERMABOND ADVANCED .7 DNX12 (GAUZE/BANDAGES/DRESSINGS) ×1 IMPLANT
DRAPE C-ARM 42X72 X-RAY (DRAPES) ×2 IMPLANT
DRAPE CHEST BREAST 15X10 FENES (DRAPES) ×2 IMPLANT
GAUZE SPONGE 2X2 8PLY STRL LF (GAUZE/BANDAGES/DRESSINGS) ×1 IMPLANT
GAUZE SPONGE 4X4 16PLY XRAY LF (GAUZE/BANDAGES/DRESSINGS) ×2 IMPLANT
GLOVE BIO SURGEON STRL SZ7 (GLOVE) ×2 IMPLANT
GLOVE BIOGEL PI IND STRL 7.0 (GLOVE) ×1 IMPLANT
GLOVE BIOGEL PI IND STRL 7.5 (GLOVE) ×1 IMPLANT
GLOVE BIOGEL PI INDICATOR 7.0 (GLOVE) ×1
GLOVE BIOGEL PI INDICATOR 7.5 (GLOVE) ×1
GLOVE SURG SS PI 7.0 STRL IVOR (GLOVE) ×2 IMPLANT
GOWN STRL NON-REIN LRG LVL3 (GOWN DISPOSABLE) ×6 IMPLANT
GOWN STRL REIN XL XLG (GOWN DISPOSABLE) ×2 IMPLANT
KIT BASIN OR (CUSTOM PROCEDURE TRAY) ×2 IMPLANT
KIT ROOM TURNOVER OR (KITS) ×2 IMPLANT
NEEDLE 18GX1X1/2 (RX/OR ONLY) (NEEDLE) ×2 IMPLANT
NEEDLE HYPO 25GX1X1/2 BEV (NEEDLE) ×2 IMPLANT
NS IRRIG 1000ML POUR BTL (IV SOLUTION) ×2 IMPLANT
PACK SURGICAL SETUP 50X90 (CUSTOM PROCEDURE TRAY) ×2 IMPLANT
PAD ARMBOARD 7.5X6 YLW CONV (MISCELLANEOUS) ×4 IMPLANT
SET MICROPUNCTURE 5F STIFF (MISCELLANEOUS) IMPLANT
SOAP 2 % CHG 4 OZ (WOUND CARE) ×2 IMPLANT
SPONGE GAUZE 2X2 STER 10/PKG (GAUZE/BANDAGES/DRESSINGS) ×1
SUT ETHILON 3 0 PS 1 (SUTURE) ×2 IMPLANT
SUT MNCRL AB 4-0 PS2 18 (SUTURE) ×2 IMPLANT
SYR 20CC LL (SYRINGE) ×4 IMPLANT
SYR 30ML LL (SYRINGE) IMPLANT
SYR 3ML LL SCALE MARK (SYRINGE) ×2 IMPLANT
SYR 5ML LL (SYRINGE) ×2 IMPLANT
SYR CONTROL 10ML LL (SYRINGE) ×2 IMPLANT
SYRINGE 10CC LL (SYRINGE) ×2 IMPLANT
TAPE CLOTH SOFT 2X10 (GAUZE/BANDAGES/DRESSINGS) ×2 IMPLANT
TOWEL OR 17X24 6PK STRL BLUE (TOWEL DISPOSABLE) ×2 IMPLANT
TOWEL OR 17X26 10 PK STRL BLUE (TOWEL DISPOSABLE) ×2 IMPLANT
WATER STERILE IRR 1000ML POUR (IV SOLUTION) ×2 IMPLANT
WIRE AMPLATZ SS-J .035X180CM (WIRE) IMPLANT

## 2013-10-07 NOTE — Anesthesia Preprocedure Evaluation (Addendum)
Anesthesia Evaluation  Patient identified by MRN, date of birth, ID band Patient awake    Airway Mallampati: II      Dental   Pulmonary Current Smoker,          Cardiovascular hypertension, + CAD, + Past MI and + Peripheral Vascular Disease     Neuro/Psych  Headaches,    GI/Hepatic negative GI ROS, Neg liver ROS,   Endo/Other  diabetes  Renal/GU Renal disease     Musculoskeletal   Abdominal   Peds  Hematology   Anesthesia Other Findings   Reproductive/Obstetrics                          Anesthesia Physical Anesthesia Plan  ASA: III  Anesthesia Plan: General   Post-op Pain Management:    Induction: Intravenous  Airway Management Planned: Simple Face Mask  Additional Equipment:   Intra-op Plan:   Post-operative Plan:   Informed Consent: I have reviewed the patients History and Physical, chart, labs and discussed the procedure including the risks, benefits and alternatives for the proposed anesthesia with the patient or authorized representative who has indicated his/her understanding and acceptance.   Dental advisory given  Plan Discussed with: CRNA and Anesthesiologist  Anesthesia Plan Comments:        Anesthesia Quick Evaluation

## 2013-10-07 NOTE — Anesthesia Postprocedure Evaluation (Signed)
  Anesthesia Post-op Note  Patient: Angela Soto  Procedure(s) Performed: Procedure(s): INSERTION OF DIALYSIS CATHETER (Right)  Patient Location: PACU  Anesthesia Type:MAC  Level of Consciousness: awake  Airway and Oxygen Therapy: Patient Spontanous Breathing  Post-op Pain: mild  Post-op Assessment: Post-op Vital signs reviewed  Post-op Vital Signs: Reviewed  Complications: No apparent anesthesia complications

## 2013-10-07 NOTE — Op Note (Signed)
OPERATIVE NOTE  PROCEDURE: 1. Right internal jugular vein tunneled dialysis catheter placement 2. Right internal jugular vein cannulation under ultrasound guidance  PRE-OPERATIVE DIAGNOSIS: end-stage renal failure  POST-OPERATIVE DIAGNOSIS: same as above  SURGEON: Adele Barthel, MD  ANESTHESIA: local and MAC  ESTIMATED BLOOD LOSS: 30 cc  FINDING(S): 1.  Tips of the catheter in the right atrium on fluoroscopy 2.  No obvious pneumothorax on fluoroscopy  SPECIMEN(S):  none  INDICATIONS:   Angela Soto is a 42 y.o. female who presents with end stage renal disease.  The patient presents for tunneled dialysis catheter placement.  The patient is aware the risks of tunneled dialysis catheter placement include but are not limited to: bleeding, infection, central venous injury, pneumothorax, possible venous stenosis, possible malpositioning in the venous system, and possible infections related to long-term catheter presence.  The patient was aware of these risks and agreed to proceed.  DESCRIPTION: After written full informed consent was obtained from the patient, the patient was taken back to the operating room.  Prior to induction, the patient was given IV antibiotics.  After obtaining adequate sedation, the patient was prepped and draped in the standard fashion for a chest or neck tunneled dialysis catheter placement.  I anesthesized the neck cannulation site with local anesthetic.  Under ultrasound guidance, the right internal jugular vein was cannulated with the 18 gauge needle.  A J-wire was then placed down in the right ventricle under fluroscopic guidance.  The wire was then secured in place with a clamp to the drapes.  The cannulation site, the catheter exit site, and tract for the subcutaneous tunnel were then anesthesized with a total of 20 cc of a 1:1 mixture of 0.5% Marcaine without epinepherine and 1% Lidocaine with epinepherine.  I then made stab incisions at the neck and exit sites.    I dissected from the exit site to the cannulation site with a metal tunneler.   The subcutaneous tunnel was dilated by passing a plastic dilator over the metal dissector. The wire was then unclamped and I removed the needle.  The skin tract and venotomy was dilated serially with dilators.  Finally, the dilator-sheath was placed under fluroscopic guidance into the superior vena cava.  The dilator and wire were removed.  A 23 cm Diatek catheter was placed under fluoroscopic guidance down into the right atrium.  The sheath was broken and peeled away while holding the catheter cuff at the level of the skin.  The back end of this catheter was transected, revealing the two lumens of this catheter.  The ports were docked onto these two lumens.  The catheter hub was then screwed into place.  Each port was tested by aspirating and flushing.  No resistance was noted.  Each port was then thoroughly flushed with heparinized saline.  The catheter was secured in placed with two interrupted stitches of 3-0 Nylon tied to the catheter.  The neck incision was closed with a U-stitch of 4-0 Monocryl.  The neck and chest incision were cleaned and sterile bandages applied.  Each port was then loaded with concentrated heparin (1000 Units/mL) at the manufacturer recommended volumes to each port.  Sterile caps were applied to each port.  On completion fluoroscopy, the tips of the catheter were in the right atrium, and there was no evidence of pneumothorax.  COMPLICATIONS: none  CONDITION: stable  Adele Barthel, MD [facility] Office: [phone] Pager: [phone]  10/07/2013, 9:34 AM

## 2013-10-07 NOTE — Preoperative (Signed)
Beta Blockers   Reason not to administer Beta Blockers:Not Applicable 

## 2013-10-07 NOTE — H&P (Signed)
[facility]  Brief History and Physical  History of Present Illness  Angela Soto is a 42 y.o. female who presents with chief complaint: end stage renal disease.  The patient presents today for tunneled dialysis catheter placement.    Past Medical History  Diagnosis Date  . Coronary artery disease   . Hypertension   . Myocardial infarction 1998  . Environmental allergies     uses inhalers  . Diabetes mellitus without complication     Type 1  . Umbilical hernia   . Headache(784.0)   . Multiple sclerosis     just diagnosed 2014  . Peripheral vascular disease     aortic artery occlusion  . Chronic kidney disease     End stage renal disease, will be starting dialysis on 10/08/13    Past Surgical History  Procedure Laterality Date  . Ble amputation bk    . Abdominal surgery  2006    aortic artery occluded, had to "clean it out"  . Cardiac stents    . Cardiac catheterization    . Coronary angioplasty    . Av fistula placement Left 09/24/2013    Procedure: LEFT UPPER EXTREMITY ARTERIOVENOUS (AV) FISTULA CREATION BRACHIAL/CEPHALIC;  Surgeon: Elam Dutch, MD;  Location: [facility];  Service: Vascular;  Laterality: Left;    History   Social History  . Marital Status: Married    Spouse Name: N/A    Number of Children: N/A  . Years of Education: N/A   Occupational History  . Not on file.   Social History Main Topics  . Smoking status: Current Every Day Smoker -- 1.00 packs/day    Types: Cigarettes  . Smokeless tobacco: Never Used  . Alcohol Use: No  . Drug Use: No  . Sexual Activity: Not on file   Other Topics Concern  . Not on file   Social History Narrative  . No narrative on file    Family History  Problem Relation Age of Onset  . Diabetes Mellitus II Mother   . Ovarian cancer Mother   . CAD Father   . Diabetes Mellitus II Father     No current facility-administered medications on file prior to encounter.   Current  Outpatient Prescriptions on File Prior to Encounter  Medication Sig Dispense Refill  . albuterol (PROVENTIL HFA;VENTOLIN HFA) 108 (90 BASE) MCG/ACT inhaler Inhale into the lungs every 6 (six) hours as needed for wheezing or shortness of breath.      Marland Kitchen amLODipine (NORVASC) 10 MG tablet Take 10 mg by mouth daily.      Marland Kitchen atorvastatin (LIPITOR) 80 MG tablet Take 80 mg by mouth at bedtime.      . calcium acetate, Phos Binder, (PHOSLYRA) 667 MG/5ML SOLN Take 5 mLs (667 mg total) by mouth 3 (three) times daily with meals.  1350 mL  1  . Carboxymethylcellulose Sodium (THERATEARS) 0.25 % SOLN Apply 2 drops to eye every 4 (four) hours.      . cloNIDine (CATAPRES) 0.2 MG tablet Take 0.2 mg by mouth 2 (two) times daily.      . clopidogrel (PLAVIX) 75 MG tablet Take 75 mg by mouth daily.      . ergocalciferol (VITAMIN D2) 50000 UNITS capsule Take 50,000 Units by mouth once a week. fridays      . Fluticasone-Salmeterol (ADVAIR DISKUS) 250-50 MCG/DOSE AEPB Inhale 1 puff into the lungs 2 (two) times daily.      Marland Kitchen FOLIC ACID PO  Take 1 tablet by mouth daily.      . furosemide (LASIX) 80 MG tablet Take 1 tablet (80 mg total) by mouth 2 (two) times daily.  60 tablet  0  . hydrALAZINE (APRESOLINE) 25 MG tablet Take 75 mg by mouth 3 (three) times daily.       . insulin aspart (NOVOLOG) 100 UNIT/ML injection Inject 14 Units into the skin 3 (three) times daily with meals.      . insulin glargine (LANTUS) 100 UNIT/ML injection Inject 0.3 mLs (30 Units total) into the skin at bedtime.  10 mL  12  . metoprolol (TOPROL-XL) 200 MG 24 hr tablet Take 200 mg by mouth 2 (two) times daily.      Marland Kitchen oxyCODONE-acetaminophen (PERCOCET/ROXICET) 5-325 MG per tablet Take 1 tablet by mouth every 6 (six) hours as needed.  30 tablet  0  . sodium polystyrene (KAYEXALATE) 15 GM/60ML suspension Take 60 mLs (15 g total) by mouth every other day.  500 mL  0  . terazosin (HYTRIN) 5 MG capsule Take 5 mg by mouth 2 (two) times daily.      . Misc.  Devices (WHEELCHAIR CUSHION) MISC 1 Units by Does not apply route daily.  1 each  0    Allergies  Allergen Reactions  . Metformin And Related Nausea And Vomiting  . Lisinopril   . Losartan   . Spironolactone   . Trimethoprim     Review of Systems: As listed above, otherwise negative.  Physical Examination  Filed Vitals:   10/07/13 0805  BP: 136/63  Pulse: 63  Temp: 98 F (36.7 C)  TempSrc: Oral  Resp: 16  SpO2: 100%    General: A&O x 3, WDWN  Pulmonary: Sym exp, good air movt, CTAB, no rales, rhonchi, & wheezing  Cardiac: RRR, Nl S1, S2, no Murmurs, rubs or gallops  Gastrointestinal: soft, NTND, -G/R, - HSM, - masses, - CVAT B  Musculoskeletal: M/S 5/5 throughout , Extremities without ischemic changes   Laboratory See Phillips is a 42 y.o. female who presents with: end stage renal disease.   The patient is scheduled for: tunneled dialysis catheter placement The patient is aware the risks of tunneled dialysis catheter placement include but are not limited to: bleeding, infection, central venous injury, pneumothorax, possible venous stenosis, possible malpositioning in the venous system, and possible infections related to long-term catheter presence.   The patient is aware of the risks and agrees to proceed.  Adele Barthel, MD [facility] Office: [phone] Pager: [phone]  10/07/2013, 8:32 AM

## 2013-10-07 NOTE — Transfer of Care (Signed)
Immediate Anesthesia Transfer of Care Note  Patient: Angela Soto  Procedure(s) Performed: Procedure(s): INSERTION OF DIALYSIS CATHETER (Right)  Patient Location: PACU  Anesthesia Type:MAC  Level of Consciousness: awake, alert  and oriented  Airway & Oxygen Therapy: Patient Spontanous Breathing  Post-op Assessment: Report given to PACU RN  Post vital signs: Reviewed and stable  Complications: No apparent anesthesia complications

## 2013-10-08 ENCOUNTER — Encounter (HOSPITAL_COMMUNITY): Payer: Self-pay | Admitting: Vascular Surgery

## 2013-10-09 DIAGNOSIS — Z23 Encounter for immunization: Secondary | ICD-10-CM | POA: Diagnosis not present

## 2013-10-09 DIAGNOSIS — Z992 Dependence on renal dialysis: Secondary | ICD-10-CM | POA: Diagnosis not present

## 2013-10-09 DIAGNOSIS — N2581 Secondary hyperparathyroidism of renal origin: Secondary | ICD-10-CM | POA: Diagnosis not present

## 2013-10-09 DIAGNOSIS — N186 End stage renal disease: Secondary | ICD-10-CM | POA: Diagnosis not present

## 2013-10-09 DIAGNOSIS — D631 Anemia in chronic kidney disease: Secondary | ICD-10-CM | POA: Diagnosis not present

## 2013-10-12 DIAGNOSIS — N186 End stage renal disease: Secondary | ICD-10-CM | POA: Diagnosis not present

## 2013-10-12 DIAGNOSIS — Z23 Encounter for immunization: Secondary | ICD-10-CM | POA: Diagnosis not present

## 2013-10-12 DIAGNOSIS — N2581 Secondary hyperparathyroidism of renal origin: Secondary | ICD-10-CM | POA: Diagnosis not present

## 2013-10-12 DIAGNOSIS — D631 Anemia in chronic kidney disease: Secondary | ICD-10-CM | POA: Diagnosis not present

## 2013-10-12 DIAGNOSIS — Z992 Dependence on renal dialysis: Secondary | ICD-10-CM | POA: Diagnosis not present

## 2013-10-15 DIAGNOSIS — Z992 Dependence on renal dialysis: Secondary | ICD-10-CM | POA: Diagnosis not present

## 2013-10-15 DIAGNOSIS — N186 End stage renal disease: Secondary | ICD-10-CM | POA: Diagnosis not present

## 2013-10-15 DIAGNOSIS — Z23 Encounter for immunization: Secondary | ICD-10-CM | POA: Diagnosis not present

## 2013-10-15 DIAGNOSIS — N2581 Secondary hyperparathyroidism of renal origin: Secondary | ICD-10-CM | POA: Diagnosis not present

## 2013-10-15 DIAGNOSIS — D631 Anemia in chronic kidney disease: Secondary | ICD-10-CM | POA: Diagnosis not present

## 2013-10-17 DIAGNOSIS — Z992 Dependence on renal dialysis: Secondary | ICD-10-CM | POA: Diagnosis not present

## 2013-10-17 DIAGNOSIS — N186 End stage renal disease: Secondary | ICD-10-CM | POA: Diagnosis not present

## 2013-10-17 DIAGNOSIS — D509 Iron deficiency anemia, unspecified: Secondary | ICD-10-CM | POA: Diagnosis not present

## 2013-10-17 DIAGNOSIS — N2581 Secondary hyperparathyroidism of renal origin: Secondary | ICD-10-CM | POA: Diagnosis not present

## 2013-10-17 DIAGNOSIS — Z23 Encounter for immunization: Secondary | ICD-10-CM | POA: Diagnosis not present

## 2013-10-18 ENCOUNTER — Encounter: Payer: Self-pay | Admitting: Family Medicine

## 2013-10-18 ENCOUNTER — Ambulatory Visit (INDEPENDENT_AMBULATORY_CARE_PROVIDER_SITE_OTHER): Payer: Medicare Other | Admitting: Family Medicine

## 2013-10-18 VITALS — BP 148/76 | HR 74 | Temp 98.2°F | Resp 20

## 2013-10-18 DIAGNOSIS — R109 Unspecified abdominal pain: Secondary | ICD-10-CM

## 2013-10-18 DIAGNOSIS — G379 Demyelinating disease of central nervous system, unspecified: Secondary | ICD-10-CM | POA: Diagnosis not present

## 2013-10-18 DIAGNOSIS — E119 Type 2 diabetes mellitus without complications: Secondary | ICD-10-CM | POA: Diagnosis not present

## 2013-10-18 DIAGNOSIS — I10 Essential (primary) hypertension: Secondary | ICD-10-CM

## 2013-10-18 DIAGNOSIS — R5381 Other malaise: Secondary | ICD-10-CM

## 2013-10-18 DIAGNOSIS — N949 Unspecified condition associated with female genital organs and menstrual cycle: Secondary | ICD-10-CM

## 2013-10-18 DIAGNOSIS — I70209 Unspecified atherosclerosis of native arteries of extremities, unspecified extremity: Secondary | ICD-10-CM

## 2013-10-18 DIAGNOSIS — Z720 Tobacco use: Secondary | ICD-10-CM

## 2013-10-18 DIAGNOSIS — I2510 Atherosclerotic heart disease of native coronary artery without angina pectoris: Secondary | ICD-10-CM | POA: Diagnosis not present

## 2013-10-18 DIAGNOSIS — R5383 Other fatigue: Secondary | ICD-10-CM

## 2013-10-18 DIAGNOSIS — R112 Nausea with vomiting, unspecified: Secondary | ICD-10-CM

## 2013-10-18 DIAGNOSIS — F17200 Nicotine dependence, unspecified, uncomplicated: Secondary | ICD-10-CM

## 2013-10-18 DIAGNOSIS — R102 Pelvic and perineal pain: Secondary | ICD-10-CM

## 2013-10-18 DIAGNOSIS — R188 Other ascites: Secondary | ICD-10-CM

## 2013-10-18 DIAGNOSIS — N185 Chronic kidney disease, stage 5: Secondary | ICD-10-CM

## 2013-10-18 DIAGNOSIS — R531 Weakness: Secondary | ICD-10-CM

## 2013-10-18 MED ORDER — PROMETHAZINE HCL 25 MG PO TABS: 25.0000 mg | ORAL_TABLET | Freq: Three times a day (TID) | ORAL | Status: DC | PRN

## 2013-10-18 MED ORDER — CLONIDINE HCL 0.2 MG PO TABS: 0.1000 mg | ORAL_TABLET | Freq: Two times a day (BID) | ORAL | Status: DC

## 2013-10-18 NOTE — Patient Instructions (Signed)
1. Continue working on quitting smoking 2. Phenergan for nausea - remember it may make you tired 3. Decrease your lantus 6 units tonight, then by 3 units nightly until you are no longer having hypoglycemic episodes - goal of about 100 in the morning 4. Change clonidine to 0.1 form 0.2. I have sent in a new prescription. Call in a week or two and let me know what your pressures have been and if you still have been dropping at dialysis. We can further adjust if needed.  5. See gyn for pelvic pain and to follow up on the elevated Ca125 6. I have referred you to neuro in [place] and to the pain clinic. I have also placed a referral for home health.  7. F/u in 3 mos, earlier if needed  Nausea and Vomiting Nausea is a sick feeling that often comes before throwing up (vomiting). Vomiting is a reflex where stomach contents come out of your mouth. Vomiting can cause severe loss of body fluids (dehydration). Children and elderly adults can become dehydrated quickly, especially if they also have diarrhea. Nausea and vomiting are symptoms of a condition or disease. It is important to find the cause of your symptoms. CAUSES   Direct irritation of the stomach lining. This irritation can result from increased acid production (gastroesophageal reflux disease), infection, food poisoning, taking certain medicines (such as nonsteroidal anti-inflammatory drugs), alcohol use, or tobacco use.  Signals from the brain.These signals could be caused by a headache, heat exposure, an inner ear disturbance, increased pressure in the brain from injury, infection, a tumor, or a concussion, pain, emotional stimulus, or metabolic problems.  An obstruction in the gastrointestinal tract (bowel obstruction).  Illnesses such as diabetes, hepatitis, gallbladder problems, appendicitis, kidney problems, cancer, sepsis, atypical symptoms of a heart attack, or eating disorders.  Medical treatments such as chemotherapy and  radiation.  Receiving medicine that makes you sleep (general anesthetic) during surgery. DIAGNOSIS Your caregiver may ask for tests to be done if the problems do not improve after a few days. Tests may also be done if symptoms are severe or if the reason for the nausea and vomiting is not clear. Tests may include:  Urine tests.  Blood tests.  Stool tests.  Cultures (to look for evidence of infection).  X-rays or other imaging studies. Test results can help your caregiver make decisions about treatment or the need for additional tests. TREATMENT You need to stay well hydrated. Drink frequently but in small amounts.You may wish to drink water, sports drinks, clear broth, or eat frozen ice pops or gelatin dessert to help stay hydrated.When you eat, eating slowly may help prevent nausea.There are also some antinausea medicines that may help prevent nausea. HOME CARE INSTRUCTIONS   Take all medicine as directed by your caregiver.  If you do not have an appetite, do not force yourself to eat. However, you must continue to drink fluids.  If you have an appetite, eat a normal diet unless your caregiver tells you differently.  Eat a variety of complex carbohydrates (rice, wheat, potatoes, bread), lean meats, yogurt, fruits, and vegetables.  Avoid high-fat foods because they are more difficult to digest.  Drink enough water and fluids to keep your urine clear or pale yellow.  If you are dehydrated, ask your caregiver for specific rehydration instructions. Signs of dehydration may include:  Severe thirst.  Dry lips and mouth.  Dizziness.  Dark urine.  Decreasing urine frequency and amount.  Confusion.  Rapid breathing or  pulse. SEEK IMMEDIATE MEDICAL CARE IF:   You have blood or brown flecks (like coffee grounds) in your vomit.  You have black or bloody stools.  You have a severe headache or stiff neck.  You are confused.  You have severe abdominal pain.  You have  chest pain or trouble breathing.  You do not urinate at least once every 8 hours.  You develop cold or clammy skin.  You continue to vomit for longer than 24 to 48 hours.  You have a fever. MAKE SURE YOU:   Understand these instructions.  Will watch your condition.  Will get help right away if you are not doing well or get worse. Document Released: 10/03/2005 Document Revised: 12/26/2011 Document Reviewed: 03/02/2011 [facility] Patient Information 2014 [place], [place].

## 2013-10-18 NOTE — Progress Notes (Signed)
Subjective:    Patient ID: Angela Soto, female    DOB: 1970-11-16, 43 y.o.   MRN: [id number]  HPI The patient is here for a general followup visit. Since she and I last saw each other she has had a fistula placed in her left arm for dialysis. And she started dialysis through port 3 times a week. Overall patient says she feels like she is doing pretty well although the day after she has dialysis she feels very weak and has hard time transferring and taking care of her medical needs.  Problem #1 abdominal pain: Pretty much resolved from last time I saw her. Ascites is much better given that she is on dialysis. She does have nausea and has had a problem of vomiting. This has been going on for about a month. She feels like she has lost weight but she is in a wheelchair we've been unable to obtain weight the last couple of visits including to take  Problem #2 chronic kidney disease stage V: She sees nephrology. See above notes on dialysis.  #3 diabetes: Last time I saw her we decreased her Lantus 3 units per night the morning goal of 100 with no hypoglycemic episodes. She said she took it down to 30 and now her morning sugars are ranging 70 to 1:30. She is still having hypoglycemic episodes in the 60s to 70s sometimes in the morning.  #4 hypertension: Alert hypertensive today in the office at 148/76 but she says at home it's been running approximately 112/68. She's been dropping during dialysis sometimes to a pressure of 70 systolic. At these times she feels dizzy and weak.  #5 Demyelinating disorder: Patient had been seeing Dr. Doren Custard. But is interested in possibly having another opinion. She's been limited due to no transportation. She did not know that [facility] went there. She does not have any new symptoms of her demyelinating disorder    Next problem #4 elevated CA 125 and pelvic pain: She was supposed to see GYN last month but had to postpone that appointment due to need to get her fistula  placed. She has not rescheduled again. Pelvic pain continues. Stop if the CA 125 is due to the ascites however we continue to follow this up  #5 chronic pain: Pelvic and general patient has been seeing Dr. Corey Skains but is interested in seeing a formal pain clinic. She has not seen one in the past.  #6 general weakness and deconditioning: This is exacerbated by dialysis per the patient. She has not had any physical therapy since her last hospital stay.   Review of Systems A 12 point review of systems is negative except as per hpi.       Objective:   Physical Exam Nursing note and vitals reviewed. Constitutional: She is oriented to person, place, and time. She appears well-developed and well-nourished. smells of tobacco.  HENT:  Right Ear: External ear normal.  Left Ear: External ear normal.  Nose: Nose normal.  Mouth/Throat: Oropharynx is clear and moist. No oropharyngeal exudate.  Eyes: Conjunctivae are normal. Pupils are equal, round, and reactive to light.  Neck: Normal range of motion. Neck supple. No thyromegaly present.  Cardiovascular: Normal rate, regular rhythm and normal heart sounds.   Pulmonary/Chest: Effort normal and breath sounds normal.  Abdominal: Soft. Bowel sounds are normal. She exhibits no distension. Generalized ttp.  There is no rebound.  Lymphadenopathy:    She has no cervical adenopathy.  Neurological: She is alert and oriented to  person, place, and time. She has normal reflexes.  Skin: Skin is warm and dry.  Psychiatric: She has a normal mood and affect. Her behavior is normal.  Msk: b/l bka      Assessment & Plan:  Angela Soto was seen today for follow-up.  Diagnoses and associated orders for this visit:  CAD (coronary artery disease) - Comprehensive metabolic panel; Future - Hemoglobin A1c; Future Continue current management. Currently no chest pain  HTN (hypertension) - cloNIDine (CATAPRES) 0.2 MG tablet; Take 0.5 tablets (0.1 mg total) by mouth 2  (two) times daily. - Comprehensive metabolic panel; Future  We'll decrease her clonidine from 0.2-0.1 mg per day. She'll keep track of her blood pressures and let me know in one to 2 weeks how they're running. If continuing to drop at dialysis we can adjust her meds overall and also on the days of dialysis. Hydralazine will be one to chest first  Diabetes mellitus - Cancel: Hemoglobin A1c - Comprehensive metabolic panel; Future - Hemoglobin A1c; Future The best heard to decrease her Lantus tonight from 30 units to 26 units. We'll see how her morning sugars are if they're still low and she's having hypoglycemic episodes and she decreased by 3 units per night until we reach her goal.  Demyelinating disease - Ambulatory referral to Neurology - Comprehensive metabolic panel; Future - Hemoglobin A1c; Future We have confirmed that she can take the R. CAT scan into [facility] for neurology  Chronic kidney disease, stage V - Comprehensive metabolic panel; Future  Continue dialysis and management per  Abdominal pain - Comprehensive metabolic panel; Future  Abdominal pain is a chronic issue for her I will check a CMP today due to the new nausea and we'll treat that with Phenergan. She continues to decline referral to GI special  Ascites - Comprehensive metabolic panel; Future   Tobacco abuse Cessation counseling provided today. Patient has been able to decrease her tobacco use from a half to one pack per day down to 5-6 cigarettes per day she declines nicotine patches or other nicotine supplementation. I don't think she is a good candidate for Chantix given that she has a history of depression  Pelvic pain in female Emphasized the importance of following with [facility] Y. and for pelvic pain and her elevated CA 125. We've made an appointment for her with GYN for January 8. This will [facility]   Weakness - Ambulatory referral to [facility] - Comprehensive metabolic panel;  Future - Hemoglobin A1c; Future  Nausea with vomiting - promethazine (PHENERGAN) 25 MG tablet; Take 1 tablet (25 mg total) by mouth every 8 (eight) hours as needed for nausea or vomiting. - Comprehensive metabolic panel; Future - Hemoglobin A1c; Future  Also this patient back in 3 months. Earlier if needed

## 2013-10-19 DIAGNOSIS — Z23 Encounter for immunization: Secondary | ICD-10-CM | POA: Diagnosis not present

## 2013-10-19 DIAGNOSIS — D509 Iron deficiency anemia, unspecified: Secondary | ICD-10-CM | POA: Diagnosis not present

## 2013-10-19 DIAGNOSIS — Z992 Dependence on renal dialysis: Secondary | ICD-10-CM | POA: Diagnosis not present

## 2013-10-19 DIAGNOSIS — N186 End stage renal disease: Secondary | ICD-10-CM | POA: Diagnosis not present

## 2013-10-19 DIAGNOSIS — N2581 Secondary hyperparathyroidism of renal origin: Secondary | ICD-10-CM | POA: Diagnosis not present

## 2013-10-22 ENCOUNTER — Other Ambulatory Visit: Payer: Self-pay | Admitting: Vascular Surgery

## 2013-10-22 DIAGNOSIS — Z4931 Encounter for adequacy testing for hemodialysis: Secondary | ICD-10-CM

## 2013-10-22 DIAGNOSIS — N2581 Secondary hyperparathyroidism of renal origin: Secondary | ICD-10-CM | POA: Diagnosis not present

## 2013-10-22 DIAGNOSIS — N186 End stage renal disease: Secondary | ICD-10-CM | POA: Diagnosis not present

## 2013-10-22 DIAGNOSIS — D509 Iron deficiency anemia, unspecified: Secondary | ICD-10-CM | POA: Diagnosis not present

## 2013-10-22 DIAGNOSIS — Z992 Dependence on renal dialysis: Secondary | ICD-10-CM | POA: Diagnosis not present

## 2013-10-22 DIAGNOSIS — Z23 Encounter for immunization: Secondary | ICD-10-CM | POA: Diagnosis not present

## 2013-10-24 ENCOUNTER — Encounter: Payer: Medicare Other | Admitting: Obstetrics & Gynecology

## 2013-10-24 DIAGNOSIS — Z23 Encounter for immunization: Secondary | ICD-10-CM | POA: Diagnosis not present

## 2013-10-24 DIAGNOSIS — Z992 Dependence on renal dialysis: Secondary | ICD-10-CM | POA: Diagnosis not present

## 2013-10-24 DIAGNOSIS — D509 Iron deficiency anemia, unspecified: Secondary | ICD-10-CM | POA: Diagnosis not present

## 2013-10-24 DIAGNOSIS — N186 End stage renal disease: Secondary | ICD-10-CM | POA: Diagnosis not present

## 2013-10-24 DIAGNOSIS — N2581 Secondary hyperparathyroidism of renal origin: Secondary | ICD-10-CM | POA: Diagnosis not present

## 2013-10-26 DIAGNOSIS — D509 Iron deficiency anemia, unspecified: Secondary | ICD-10-CM | POA: Diagnosis not present

## 2013-10-26 DIAGNOSIS — N2581 Secondary hyperparathyroidism of renal origin: Secondary | ICD-10-CM | POA: Diagnosis not present

## 2013-10-26 DIAGNOSIS — Z992 Dependence on renal dialysis: Secondary | ICD-10-CM | POA: Diagnosis not present

## 2013-10-26 DIAGNOSIS — N186 End stage renal disease: Secondary | ICD-10-CM | POA: Diagnosis not present

## 2013-10-26 DIAGNOSIS — Z23 Encounter for immunization: Secondary | ICD-10-CM | POA: Diagnosis not present

## 2013-10-27 DIAGNOSIS — Z794 Long term (current) use of insulin: Secondary | ICD-10-CM | POA: Diagnosis not present

## 2013-10-27 DIAGNOSIS — I129 Hypertensive chronic kidney disease with stage 1 through stage 4 chronic kidney disease, or unspecified chronic kidney disease: Secondary | ICD-10-CM | POA: Diagnosis not present

## 2013-10-27 DIAGNOSIS — I2510 Atherosclerotic heart disease of native coronary artery without angina pectoris: Secondary | ICD-10-CM | POA: Diagnosis not present

## 2013-10-27 DIAGNOSIS — E119 Type 2 diabetes mellitus without complications: Secondary | ICD-10-CM | POA: Diagnosis not present

## 2013-10-27 DIAGNOSIS — N185 Chronic kidney disease, stage 5: Secondary | ICD-10-CM | POA: Diagnosis not present

## 2013-10-27 DIAGNOSIS — Z992 Dependence on renal dialysis: Secondary | ICD-10-CM | POA: Diagnosis not present

## 2013-10-27 DIAGNOSIS — I739 Peripheral vascular disease, unspecified: Secondary | ICD-10-CM | POA: Diagnosis not present

## 2013-10-27 DIAGNOSIS — G35 Multiple sclerosis: Secondary | ICD-10-CM | POA: Diagnosis not present

## 2013-10-28 ENCOUNTER — Other Ambulatory Visit: Payer: Self-pay | Admitting: *Deleted

## 2013-10-28 DIAGNOSIS — E559 Vitamin D deficiency, unspecified: Secondary | ICD-10-CM | POA: Diagnosis not present

## 2013-10-28 DIAGNOSIS — N19 Unspecified kidney failure: Secondary | ICD-10-CM | POA: Diagnosis not present

## 2013-10-28 DIAGNOSIS — G894 Chronic pain syndrome: Secondary | ICD-10-CM | POA: Diagnosis not present

## 2013-10-28 DIAGNOSIS — Z79899 Other long term (current) drug therapy: Secondary | ICD-10-CM | POA: Diagnosis not present

## 2013-10-28 MED ORDER — ATORVASTATIN CALCIUM 80 MG PO TABS: 80.0000 mg | ORAL_TABLET | Freq: Every day | ORAL | Status: DC

## 2013-10-29 DIAGNOSIS — N2581 Secondary hyperparathyroidism of renal origin: Secondary | ICD-10-CM | POA: Diagnosis not present

## 2013-10-29 DIAGNOSIS — N186 End stage renal disease: Secondary | ICD-10-CM | POA: Diagnosis not present

## 2013-10-29 DIAGNOSIS — Z23 Encounter for immunization: Secondary | ICD-10-CM | POA: Diagnosis not present

## 2013-10-29 DIAGNOSIS — D509 Iron deficiency anemia, unspecified: Secondary | ICD-10-CM | POA: Diagnosis not present

## 2013-10-29 DIAGNOSIS — Z992 Dependence on renal dialysis: Secondary | ICD-10-CM | POA: Diagnosis not present

## 2013-10-30 ENCOUNTER — Other Ambulatory Visit: Payer: Self-pay | Admitting: Obstetrics & Gynecology

## 2013-10-30 ENCOUNTER — Encounter: Payer: Self-pay | Admitting: Obstetrics & Gynecology

## 2013-10-30 ENCOUNTER — Ambulatory Visit (INDEPENDENT_AMBULATORY_CARE_PROVIDER_SITE_OTHER): Payer: Medicare Other | Admitting: Obstetrics & Gynecology

## 2013-10-30 ENCOUNTER — Ambulatory Visit (INDEPENDENT_AMBULATORY_CARE_PROVIDER_SITE_OTHER): Payer: Medicare Other

## 2013-10-30 VITALS — BP 130/80

## 2013-10-30 DIAGNOSIS — R971 Elevated cancer antigen 125 [CA 125]: Secondary | ICD-10-CM | POA: Diagnosis not present

## 2013-10-30 DIAGNOSIS — N949 Unspecified condition associated with female genital organs and menstrual cycle: Secondary | ICD-10-CM

## 2013-10-31 DIAGNOSIS — D509 Iron deficiency anemia, unspecified: Secondary | ICD-10-CM | POA: Diagnosis not present

## 2013-10-31 DIAGNOSIS — Z23 Encounter for immunization: Secondary | ICD-10-CM | POA: Diagnosis not present

## 2013-10-31 DIAGNOSIS — Z992 Dependence on renal dialysis: Secondary | ICD-10-CM | POA: Diagnosis not present

## 2013-10-31 DIAGNOSIS — N186 End stage renal disease: Secondary | ICD-10-CM | POA: Diagnosis not present

## 2013-10-31 DIAGNOSIS — N2581 Secondary hyperparathyroidism of renal origin: Secondary | ICD-10-CM | POA: Diagnosis not present

## 2013-11-01 ENCOUNTER — Telehealth: Payer: Self-pay | Admitting: *Deleted

## 2013-11-01 DIAGNOSIS — I739 Peripheral vascular disease, unspecified: Secondary | ICD-10-CM | POA: Diagnosis not present

## 2013-11-01 DIAGNOSIS — G35 Multiple sclerosis: Secondary | ICD-10-CM | POA: Diagnosis not present

## 2013-11-01 DIAGNOSIS — E119 Type 2 diabetes mellitus without complications: Secondary | ICD-10-CM | POA: Diagnosis not present

## 2013-11-01 DIAGNOSIS — N185 Chronic kidney disease, stage 5: Secondary | ICD-10-CM | POA: Diagnosis not present

## 2013-11-01 DIAGNOSIS — I2510 Atherosclerotic heart disease of native coronary artery without angina pectoris: Secondary | ICD-10-CM | POA: Diagnosis not present

## 2013-11-01 DIAGNOSIS — I129 Hypertensive chronic kidney disease with stage 1 through stage 4 chronic kidney disease, or unspecified chronic kidney disease: Secondary | ICD-10-CM | POA: Diagnosis not present

## 2013-11-01 NOTE — Telephone Encounter (Signed)
Angela Soto called and left VM stating that he was PT for [place]. He stated that the w/c that pt has is not an appropriate chair for her. He stated that it was given to her by a friend and that she almost fell out of chair when he was in the home. He stated that he put a request for a custom w/c evaluation and that we would be receiving paperwork regarding this for approval. He stated that she would be going to [facility] for the evaluation and that he would be seeing her twice a week to assist with proper w/c mechanics when she receives her new w/c. He stated he just wanted to update MD. Will route to MD.

## 2013-11-02 DIAGNOSIS — Z23 Encounter for immunization: Secondary | ICD-10-CM | POA: Diagnosis not present

## 2013-11-02 DIAGNOSIS — D509 Iron deficiency anemia, unspecified: Secondary | ICD-10-CM | POA: Diagnosis not present

## 2013-11-02 DIAGNOSIS — N2581 Secondary hyperparathyroidism of renal origin: Secondary | ICD-10-CM | POA: Diagnosis not present

## 2013-11-02 DIAGNOSIS — N186 End stage renal disease: Secondary | ICD-10-CM | POA: Diagnosis not present

## 2013-11-02 DIAGNOSIS — Z992 Dependence on renal dialysis: Secondary | ICD-10-CM | POA: Diagnosis not present

## 2013-11-04 DIAGNOSIS — E119 Type 2 diabetes mellitus without complications: Secondary | ICD-10-CM | POA: Diagnosis not present

## 2013-11-04 DIAGNOSIS — I2510 Atherosclerotic heart disease of native coronary artery without angina pectoris: Secondary | ICD-10-CM | POA: Diagnosis not present

## 2013-11-04 DIAGNOSIS — I129 Hypertensive chronic kidney disease with stage 1 through stage 4 chronic kidney disease, or unspecified chronic kidney disease: Secondary | ICD-10-CM | POA: Diagnosis not present

## 2013-11-04 DIAGNOSIS — N185 Chronic kidney disease, stage 5: Secondary | ICD-10-CM | POA: Diagnosis not present

## 2013-11-04 DIAGNOSIS — G35 Multiple sclerosis: Secondary | ICD-10-CM | POA: Diagnosis not present

## 2013-11-04 DIAGNOSIS — I739 Peripheral vascular disease, unspecified: Secondary | ICD-10-CM | POA: Diagnosis not present

## 2013-11-05 DIAGNOSIS — Z23 Encounter for immunization: Secondary | ICD-10-CM | POA: Diagnosis not present

## 2013-11-05 DIAGNOSIS — Z992 Dependence on renal dialysis: Secondary | ICD-10-CM | POA: Diagnosis not present

## 2013-11-05 DIAGNOSIS — N2581 Secondary hyperparathyroidism of renal origin: Secondary | ICD-10-CM | POA: Diagnosis not present

## 2013-11-05 DIAGNOSIS — N186 End stage renal disease: Secondary | ICD-10-CM | POA: Diagnosis not present

## 2013-11-05 DIAGNOSIS — D509 Iron deficiency anemia, unspecified: Secondary | ICD-10-CM | POA: Diagnosis not present

## 2013-11-06 ENCOUNTER — Encounter: Payer: Self-pay | Admitting: Vascular Surgery

## 2013-11-06 DIAGNOSIS — I2510 Atherosclerotic heart disease of native coronary artery without angina pectoris: Secondary | ICD-10-CM | POA: Diagnosis not present

## 2013-11-06 DIAGNOSIS — I129 Hypertensive chronic kidney disease with stage 1 through stage 4 chronic kidney disease, or unspecified chronic kidney disease: Secondary | ICD-10-CM | POA: Diagnosis not present

## 2013-11-06 DIAGNOSIS — E119 Type 2 diabetes mellitus without complications: Secondary | ICD-10-CM | POA: Diagnosis not present

## 2013-11-06 DIAGNOSIS — N185 Chronic kidney disease, stage 5: Secondary | ICD-10-CM | POA: Diagnosis not present

## 2013-11-06 DIAGNOSIS — G35 Multiple sclerosis: Secondary | ICD-10-CM | POA: Diagnosis not present

## 2013-11-06 DIAGNOSIS — I739 Peripheral vascular disease, unspecified: Secondary | ICD-10-CM | POA: Diagnosis not present

## 2013-11-07 ENCOUNTER — Other Ambulatory Visit (HOSPITAL_COMMUNITY): Payer: Medicare Other

## 2013-11-07 ENCOUNTER — Encounter: Payer: Medicare Other | Admitting: Vascular Surgery

## 2013-11-07 DIAGNOSIS — N2581 Secondary hyperparathyroidism of renal origin: Secondary | ICD-10-CM | POA: Diagnosis not present

## 2013-11-07 DIAGNOSIS — N186 End stage renal disease: Secondary | ICD-10-CM | POA: Diagnosis not present

## 2013-11-07 DIAGNOSIS — Z23 Encounter for immunization: Secondary | ICD-10-CM | POA: Diagnosis not present

## 2013-11-07 DIAGNOSIS — D509 Iron deficiency anemia, unspecified: Secondary | ICD-10-CM | POA: Diagnosis not present

## 2013-11-07 DIAGNOSIS — Z992 Dependence on renal dialysis: Secondary | ICD-10-CM | POA: Diagnosis not present

## 2013-11-08 DIAGNOSIS — N185 Chronic kidney disease, stage 5: Secondary | ICD-10-CM | POA: Diagnosis not present

## 2013-11-08 DIAGNOSIS — E119 Type 2 diabetes mellitus without complications: Secondary | ICD-10-CM | POA: Diagnosis not present

## 2013-11-08 DIAGNOSIS — I739 Peripheral vascular disease, unspecified: Secondary | ICD-10-CM | POA: Diagnosis not present

## 2013-11-08 DIAGNOSIS — G35 Multiple sclerosis: Secondary | ICD-10-CM | POA: Diagnosis not present

## 2013-11-08 DIAGNOSIS — I129 Hypertensive chronic kidney disease with stage 1 through stage 4 chronic kidney disease, or unspecified chronic kidney disease: Secondary | ICD-10-CM | POA: Diagnosis not present

## 2013-11-08 DIAGNOSIS — I2510 Atherosclerotic heart disease of native coronary artery without angina pectoris: Secondary | ICD-10-CM | POA: Diagnosis not present

## 2013-11-09 DIAGNOSIS — Z23 Encounter for immunization: Secondary | ICD-10-CM | POA: Diagnosis not present

## 2013-11-09 DIAGNOSIS — N2581 Secondary hyperparathyroidism of renal origin: Secondary | ICD-10-CM | POA: Diagnosis not present

## 2013-11-09 DIAGNOSIS — D509 Iron deficiency anemia, unspecified: Secondary | ICD-10-CM | POA: Diagnosis not present

## 2013-11-09 DIAGNOSIS — N186 End stage renal disease: Secondary | ICD-10-CM | POA: Diagnosis not present

## 2013-11-09 DIAGNOSIS — Z992 Dependence on renal dialysis: Secondary | ICD-10-CM | POA: Diagnosis not present

## 2013-11-12 DIAGNOSIS — N2581 Secondary hyperparathyroidism of renal origin: Secondary | ICD-10-CM | POA: Diagnosis not present

## 2013-11-12 DIAGNOSIS — D509 Iron deficiency anemia, unspecified: Secondary | ICD-10-CM | POA: Diagnosis not present

## 2013-11-12 DIAGNOSIS — Z992 Dependence on renal dialysis: Secondary | ICD-10-CM | POA: Diagnosis not present

## 2013-11-12 DIAGNOSIS — N186 End stage renal disease: Secondary | ICD-10-CM | POA: Diagnosis not present

## 2013-11-12 DIAGNOSIS — Z23 Encounter for immunization: Secondary | ICD-10-CM | POA: Diagnosis not present

## 2013-11-13 ENCOUNTER — Encounter: Payer: Self-pay | Admitting: Vascular Surgery

## 2013-11-13 DIAGNOSIS — N186 End stage renal disease: Secondary | ICD-10-CM | POA: Diagnosis not present

## 2013-11-13 DIAGNOSIS — Z992 Dependence on renal dialysis: Secondary | ICD-10-CM | POA: Diagnosis not present

## 2013-11-13 DIAGNOSIS — D509 Iron deficiency anemia, unspecified: Secondary | ICD-10-CM | POA: Diagnosis not present

## 2013-11-13 DIAGNOSIS — Z23 Encounter for immunization: Secondary | ICD-10-CM | POA: Diagnosis not present

## 2013-11-13 DIAGNOSIS — N2581 Secondary hyperparathyroidism of renal origin: Secondary | ICD-10-CM | POA: Diagnosis not present

## 2013-11-14 ENCOUNTER — Encounter: Payer: Self-pay | Admitting: Vascular Surgery

## 2013-11-14 ENCOUNTER — Ambulatory Visit (INDEPENDENT_AMBULATORY_CARE_PROVIDER_SITE_OTHER): Payer: Self-pay | Admitting: Vascular Surgery

## 2013-11-14 ENCOUNTER — Ambulatory Visit (HOSPITAL_COMMUNITY)
Admission: RE | Admit: 2013-11-14 | Discharge: 2013-11-14 | Disposition: A | Discharge status: Home / Self Care | Payer: Medicare Other | Source: Ambulatory Visit | Attending: Vascular Surgery | Admitting: Vascular Surgery

## 2013-11-14 VITALS — BP 119/67 | HR 76 | Ht <= 58 in | Wt 192.0 lb

## 2013-11-14 DIAGNOSIS — Z4931 Encounter for adequacy testing for hemodialysis: Secondary | ICD-10-CM | POA: Diagnosis not present

## 2013-11-14 DIAGNOSIS — N186 End stage renal disease: Secondary | ICD-10-CM

## 2013-11-14 DIAGNOSIS — Z992 Dependence on renal dialysis: Secondary | ICD-10-CM

## 2013-11-14 DIAGNOSIS — Z7189 Other specified counseling: Secondary | ICD-10-CM

## 2013-11-14 NOTE — Progress Notes (Signed)
Patient is a 43 year old female returns for followup today after a first stage left basilic vein transposition fistula. This was 09/24/2013. She is currently not on dialysis. She does have some intermittent numbness and tingling in her left hand. This is primarily in digits one and 2. Occasionally occurs in digits 34 and 5. She denies ulceration on the tips of her fingers. She does have some coolness of the hand.  She also complains of pain in her bilateral below-knee amputation stumps. She apparently has had multiple bypass procedures previously in [place]. Records are unavailable for review this today. However from her description it sounds like an aortic procedure and a right lower extremity bypass. All of these failed. She describes burning aching and pain in both of her below-knee dictation stops which is chronic in nature.  Past Medical History  Diagnosis Date  . Coronary artery disease   . Hypertension   . Myocardial infarction 1998  . Environmental allergies     uses inhalers  . Diabetes mellitus without complication     Type 1  . Umbilical hernia   . Headache(784.0)   . Multiple sclerosis     just diagnosed 2014  . Peripheral vascular disease     aortic artery occlusion  . Chronic kidney disease     End stage renal disease, will be starting dialysis on 10/08/13     Past Surgical History  Procedure Laterality Date  . Ble amputation bk    . Abdominal surgery  2006    aortic artery occluded, had to "clean it out"  . Cardiac stents    . Cardiac catheterization    . Coronary angioplasty    . Av fistula placement Left 09/24/2013    Procedure: LEFT UPPER EXTREMITY ARTERIOVENOUS (AV) FISTULA CREATION BRACHIAL/CEPHALIC;  Surgeon: Elam Dutch, MD;  Location: [facility];  Service: Vascular;  Laterality: Left;  . Insertion of dialysis catheter Right 10/07/2013    Procedure: INSERTION OF DIALYSIS CATHETER;  Surgeon: Conrad Mechan[place]burg, MD;  Location: [facility];  Service: Vascular;  Laterality:  Right;    Review of systems: She denies shortness of breath. She denies chest pain. However, she is not very active overall.  Physical exam:  Filed Vitals:   11/14/13 1151  BP: 119/67  Pulse: 76  Height: 4\' 3"  (1.295 m)  Weight: 192 lb (87.091 kg)  SpO2: 100%    Left upper extremity: Well-healed antecubital incision easily palpable thrill in fistula no palpable left radial pulse left hand is cool but with reasonable capillary refill  Lower extremities: Bilateral well-healed below-knee amputations No ulcerations tip of stump is cool and dusky bilaterally  Data: Patient is a duplex ultrasound of her fistula today. This shows the diameter is 8-10 mm. The depth is 1-1.5 cm.  Assessment: #1 maturing left basilic vein fistula with mild / moderate steal symptoms. The patient will take a few more weeks to think about whether or not the symptoms are disabling enough for her that she wishes to abandon the fistula. Otherwise we will do a second stage transposition. #2 bilateral painful below-knee amputation. The patient was referred to a pain management specialist today. We will also obtain her old records from the work to see if there are any options for improving her circulation although I doubt this. If she has continued problems with pain we could consider above-knee amputations but we may be trading 1 problem for a new one. She will followup in one month.  Ruta Hinds, MD [facility] [place] Office: [phone] Pager: [phone]

## 2013-11-15 DIAGNOSIS — I129 Hypertensive chronic kidney disease with stage 1 through stage 4 chronic kidney disease, or unspecified chronic kidney disease: Secondary | ICD-10-CM | POA: Diagnosis not present

## 2013-11-15 DIAGNOSIS — N185 Chronic kidney disease, stage 5: Secondary | ICD-10-CM | POA: Diagnosis not present

## 2013-11-15 DIAGNOSIS — E119 Type 2 diabetes mellitus without complications: Secondary | ICD-10-CM | POA: Diagnosis not present

## 2013-11-15 DIAGNOSIS — I739 Peripheral vascular disease, unspecified: Secondary | ICD-10-CM | POA: Diagnosis not present

## 2013-11-15 DIAGNOSIS — G35 Multiple sclerosis: Secondary | ICD-10-CM | POA: Diagnosis not present

## 2013-11-15 DIAGNOSIS — I2510 Atherosclerotic heart disease of native coronary artery without angina pectoris: Secondary | ICD-10-CM | POA: Diagnosis not present

## 2013-11-16 DIAGNOSIS — D509 Iron deficiency anemia, unspecified: Secondary | ICD-10-CM | POA: Diagnosis not present

## 2013-11-16 DIAGNOSIS — Z23 Encounter for immunization: Secondary | ICD-10-CM | POA: Diagnosis not present

## 2013-11-16 DIAGNOSIS — Z992 Dependence on renal dialysis: Secondary | ICD-10-CM | POA: Diagnosis not present

## 2013-11-16 DIAGNOSIS — N186 End stage renal disease: Secondary | ICD-10-CM | POA: Diagnosis not present

## 2013-11-16 DIAGNOSIS — N2581 Secondary hyperparathyroidism of renal origin: Secondary | ICD-10-CM | POA: Diagnosis not present

## 2013-11-18 DIAGNOSIS — N185 Chronic kidney disease, stage 5: Secondary | ICD-10-CM | POA: Diagnosis not present

## 2013-11-18 DIAGNOSIS — I129 Hypertensive chronic kidney disease with stage 1 through stage 4 chronic kidney disease, or unspecified chronic kidney disease: Secondary | ICD-10-CM | POA: Diagnosis not present

## 2013-11-18 DIAGNOSIS — I2510 Atherosclerotic heart disease of native coronary artery without angina pectoris: Secondary | ICD-10-CM | POA: Diagnosis not present

## 2013-11-18 DIAGNOSIS — I739 Peripheral vascular disease, unspecified: Secondary | ICD-10-CM | POA: Diagnosis not present

## 2013-11-18 DIAGNOSIS — E119 Type 2 diabetes mellitus without complications: Secondary | ICD-10-CM | POA: Diagnosis not present

## 2013-11-18 DIAGNOSIS — G35 Multiple sclerosis: Secondary | ICD-10-CM | POA: Diagnosis not present

## 2013-11-19 DIAGNOSIS — N186 End stage renal disease: Secondary | ICD-10-CM | POA: Diagnosis not present

## 2013-11-19 DIAGNOSIS — N2581 Secondary hyperparathyroidism of renal origin: Secondary | ICD-10-CM | POA: Diagnosis not present

## 2013-11-19 DIAGNOSIS — Z23 Encounter for immunization: Secondary | ICD-10-CM | POA: Diagnosis not present

## 2013-11-19 DIAGNOSIS — D509 Iron deficiency anemia, unspecified: Secondary | ICD-10-CM | POA: Diagnosis not present

## 2013-11-19 DIAGNOSIS — Z992 Dependence on renal dialysis: Secondary | ICD-10-CM | POA: Diagnosis not present

## 2013-11-20 DIAGNOSIS — I129 Hypertensive chronic kidney disease with stage 1 through stage 4 chronic kidney disease, or unspecified chronic kidney disease: Secondary | ICD-10-CM | POA: Diagnosis not present

## 2013-11-20 DIAGNOSIS — I739 Peripheral vascular disease, unspecified: Secondary | ICD-10-CM | POA: Diagnosis not present

## 2013-11-20 DIAGNOSIS — G35 Multiple sclerosis: Secondary | ICD-10-CM | POA: Diagnosis not present

## 2013-11-20 DIAGNOSIS — I2510 Atherosclerotic heart disease of native coronary artery without angina pectoris: Secondary | ICD-10-CM | POA: Diagnosis not present

## 2013-11-20 DIAGNOSIS — N185 Chronic kidney disease, stage 5: Secondary | ICD-10-CM | POA: Diagnosis not present

## 2013-11-20 DIAGNOSIS — E119 Type 2 diabetes mellitus without complications: Secondary | ICD-10-CM | POA: Diagnosis not present

## 2013-11-20 NOTE — Progress Notes (Signed)
Patient ID: Angela Soto, female   DOB: October 12, 1971, 43 y.o.   MRN: [id number] Blood pressure 130/80.  Pt with pelvic pain and mildly elevated ca 0000000 complicaed medical history, has non hepatitis non alcohol related cirrhosis  Sonogram is normal, normal ovaries US Transvaginal Non-ob  10/30/2013   GYNECOLOGIC SONOGRAM   Angela Soto is a 43 y.o. No obstetric history on file.  for a pelvic  sonogram for elevated CA 125.  Uterus                      6.6 x 4.5 x 3.2 cm,   Endometrium          4.8 mm, symmetrical,   Right ovary             3.6 x 2.7 x 2.7 cm,   Left ovary                3.0 x 2.7 x 2.2 cm,   + Ascites noted within pelvis  Technician Comments:  Transvaginal u/s performed,  Anteverted uterus noted, Endom-4.68mm,  bilateral ovaries appears wnl, + Ascites noted    Lazarus Gowda 10/30/2013 3:11 PM     No further follow up needed

## 2013-11-21 DIAGNOSIS — Z23 Encounter for immunization: Secondary | ICD-10-CM | POA: Diagnosis not present

## 2013-11-21 DIAGNOSIS — N186 End stage renal disease: Secondary | ICD-10-CM | POA: Diagnosis not present

## 2013-11-21 DIAGNOSIS — Z992 Dependence on renal dialysis: Secondary | ICD-10-CM | POA: Diagnosis not present

## 2013-11-21 DIAGNOSIS — D509 Iron deficiency anemia, unspecified: Secondary | ICD-10-CM | POA: Diagnosis not present

## 2013-11-21 DIAGNOSIS — N2581 Secondary hyperparathyroidism of renal origin: Secondary | ICD-10-CM | POA: Diagnosis not present

## 2013-11-23 DIAGNOSIS — N2581 Secondary hyperparathyroidism of renal origin: Secondary | ICD-10-CM | POA: Diagnosis not present

## 2013-11-23 DIAGNOSIS — N186 End stage renal disease: Secondary | ICD-10-CM | POA: Diagnosis not present

## 2013-11-23 DIAGNOSIS — D509 Iron deficiency anemia, unspecified: Secondary | ICD-10-CM | POA: Diagnosis not present

## 2013-11-23 DIAGNOSIS — Z23 Encounter for immunization: Secondary | ICD-10-CM | POA: Diagnosis not present

## 2013-11-23 DIAGNOSIS — Z992 Dependence on renal dialysis: Secondary | ICD-10-CM | POA: Diagnosis not present

## 2013-11-26 DIAGNOSIS — N2581 Secondary hyperparathyroidism of renal origin: Secondary | ICD-10-CM | POA: Diagnosis not present

## 2013-11-26 DIAGNOSIS — N186 End stage renal disease: Secondary | ICD-10-CM | POA: Diagnosis not present

## 2013-11-26 DIAGNOSIS — Z23 Encounter for immunization: Secondary | ICD-10-CM | POA: Diagnosis not present

## 2013-11-26 DIAGNOSIS — D509 Iron deficiency anemia, unspecified: Secondary | ICD-10-CM | POA: Diagnosis not present

## 2013-11-26 DIAGNOSIS — Z992 Dependence on renal dialysis: Secondary | ICD-10-CM | POA: Diagnosis not present

## 2013-11-30 DIAGNOSIS — Z23 Encounter for immunization: Secondary | ICD-10-CM | POA: Diagnosis not present

## 2013-11-30 DIAGNOSIS — N186 End stage renal disease: Secondary | ICD-10-CM | POA: Diagnosis not present

## 2013-11-30 DIAGNOSIS — D509 Iron deficiency anemia, unspecified: Secondary | ICD-10-CM | POA: Diagnosis not present

## 2013-11-30 DIAGNOSIS — N2581 Secondary hyperparathyroidism of renal origin: Secondary | ICD-10-CM | POA: Diagnosis not present

## 2013-11-30 DIAGNOSIS — Z992 Dependence on renal dialysis: Secondary | ICD-10-CM | POA: Diagnosis not present

## 2013-12-05 DIAGNOSIS — N2581 Secondary hyperparathyroidism of renal origin: Secondary | ICD-10-CM | POA: Diagnosis not present

## 2013-12-05 DIAGNOSIS — Z23 Encounter for immunization: Secondary | ICD-10-CM | POA: Diagnosis not present

## 2013-12-05 DIAGNOSIS — N186 End stage renal disease: Secondary | ICD-10-CM | POA: Diagnosis not present

## 2013-12-05 DIAGNOSIS — Z992 Dependence on renal dialysis: Secondary | ICD-10-CM | POA: Diagnosis not present

## 2013-12-05 DIAGNOSIS — D509 Iron deficiency anemia, unspecified: Secondary | ICD-10-CM | POA: Diagnosis not present

## 2013-12-07 DIAGNOSIS — D509 Iron deficiency anemia, unspecified: Secondary | ICD-10-CM | POA: Diagnosis not present

## 2013-12-07 DIAGNOSIS — Z23 Encounter for immunization: Secondary | ICD-10-CM | POA: Diagnosis not present

## 2013-12-07 DIAGNOSIS — Z992 Dependence on renal dialysis: Secondary | ICD-10-CM | POA: Diagnosis not present

## 2013-12-07 DIAGNOSIS — N186 End stage renal disease: Secondary | ICD-10-CM | POA: Diagnosis not present

## 2013-12-07 DIAGNOSIS — N2581 Secondary hyperparathyroidism of renal origin: Secondary | ICD-10-CM | POA: Diagnosis not present

## 2013-12-09 DIAGNOSIS — G894 Chronic pain syndrome: Secondary | ICD-10-CM | POA: Diagnosis not present

## 2013-12-09 DIAGNOSIS — R209 Unspecified disturbances of skin sensation: Secondary | ICD-10-CM | POA: Diagnosis not present

## 2013-12-09 DIAGNOSIS — Z79899 Other long term (current) drug therapy: Secondary | ICD-10-CM | POA: Diagnosis not present

## 2013-12-10 DIAGNOSIS — N2581 Secondary hyperparathyroidism of renal origin: Secondary | ICD-10-CM | POA: Diagnosis not present

## 2013-12-10 DIAGNOSIS — D509 Iron deficiency anemia, unspecified: Secondary | ICD-10-CM | POA: Diagnosis not present

## 2013-12-10 DIAGNOSIS — Z992 Dependence on renal dialysis: Secondary | ICD-10-CM | POA: Diagnosis not present

## 2013-12-10 DIAGNOSIS — Z23 Encounter for immunization: Secondary | ICD-10-CM | POA: Diagnosis not present

## 2013-12-10 DIAGNOSIS — N186 End stage renal disease: Secondary | ICD-10-CM | POA: Diagnosis not present

## 2013-12-13 DIAGNOSIS — Z992 Dependence on renal dialysis: Secondary | ICD-10-CM | POA: Diagnosis not present

## 2013-12-13 DIAGNOSIS — N186 End stage renal disease: Secondary | ICD-10-CM | POA: Diagnosis not present

## 2013-12-13 DIAGNOSIS — D509 Iron deficiency anemia, unspecified: Secondary | ICD-10-CM | POA: Diagnosis not present

## 2013-12-13 DIAGNOSIS — N2581 Secondary hyperparathyroidism of renal origin: Secondary | ICD-10-CM | POA: Diagnosis not present

## 2013-12-13 DIAGNOSIS — Z23 Encounter for immunization: Secondary | ICD-10-CM | POA: Diagnosis not present

## 2013-12-14 DIAGNOSIS — Z23 Encounter for immunization: Secondary | ICD-10-CM | POA: Diagnosis not present

## 2013-12-14 DIAGNOSIS — D509 Iron deficiency anemia, unspecified: Secondary | ICD-10-CM | POA: Diagnosis not present

## 2013-12-14 DIAGNOSIS — N186 End stage renal disease: Secondary | ICD-10-CM | POA: Diagnosis not present

## 2013-12-14 DIAGNOSIS — N2581 Secondary hyperparathyroidism of renal origin: Secondary | ICD-10-CM | POA: Diagnosis not present

## 2013-12-14 DIAGNOSIS — Z992 Dependence on renal dialysis: Secondary | ICD-10-CM | POA: Diagnosis not present

## 2013-12-17 DIAGNOSIS — Z992 Dependence on renal dialysis: Secondary | ICD-10-CM | POA: Diagnosis not present

## 2013-12-17 DIAGNOSIS — Z23 Encounter for immunization: Secondary | ICD-10-CM | POA: Diagnosis not present

## 2013-12-17 DIAGNOSIS — N186 End stage renal disease: Secondary | ICD-10-CM | POA: Diagnosis not present

## 2013-12-17 DIAGNOSIS — N2581 Secondary hyperparathyroidism of renal origin: Secondary | ICD-10-CM | POA: Diagnosis not present

## 2013-12-17 DIAGNOSIS — D509 Iron deficiency anemia, unspecified: Secondary | ICD-10-CM | POA: Diagnosis not present

## 2013-12-18 ENCOUNTER — Encounter: Payer: Self-pay | Admitting: Vascular Surgery

## 2013-12-19 ENCOUNTER — Ambulatory Visit (INDEPENDENT_AMBULATORY_CARE_PROVIDER_SITE_OTHER): Payer: Medicare Other | Admitting: Vascular Surgery

## 2013-12-19 ENCOUNTER — Encounter: Payer: Self-pay | Admitting: Vascular Surgery

## 2013-12-19 VITALS — BP 137/64 | HR 71 | Ht <= 58 in | Wt 192.0 lb

## 2013-12-19 DIAGNOSIS — N186 End stage renal disease: Secondary | ICD-10-CM | POA: Diagnosis not present

## 2013-12-19 DIAGNOSIS — M25559 Pain in unspecified hip: Secondary | ICD-10-CM

## 2013-12-19 NOTE — Progress Notes (Signed)
Patient is a 43 year old female returns for followup today after a first stage left basilic vein transposition fistula. This was 09/24/2013. She is currently on dialysis via a right-sided catheter. She does have some intermittent numbness and tingling in her left hand. This is primarily in digits one and 2. Occasionally occurs in digits 34 and 5. She denies ulceration on the tips of her fingers. She does have some coolness of the hand.  She also complains of pain in her bilateral below-knee amputation stumps as well as both hips. She apparently has had multiple bypass procedures previously in [place]. I reviewed her records which showed multiple prior bypasses in her lower extremities ultimately ending an amputation bilaterally.  She describes burning aching and pain in both of her below-knee amputation stumps which is chronic in nature.  Past Medical History  Diagnosis Date  . Coronary artery disease   . Hypertension   . Myocardial infarction 1998  . Environmental allergies     uses inhalers  . Diabetes mellitus without complication     Type 1  . Umbilical hernia   . Headache(784.0)   . Multiple sclerosis     just diagnosed 2014  . Peripheral vascular disease     aortic artery occlusion  . Chronic kidney disease     End stage renal disease, will be starting dialysis on 10/08/13    Past Surgical History  Procedure Laterality Date  . Ble amputation bk    . Abdominal surgery  2006    aortic artery occluded, had to "clean it out"  . Cardiac stents    . Cardiac catheterization    . Coronary angioplasty    . Av fistula placement Left 09/24/2013    Procedure: LEFT UPPER EXTREMITY ARTERIOVENOUS (AV) FISTULA CREATION BRACHIAL/CEPHALIC;  Surgeon: Elam Dutch, MD;  Location: [facility];  Service: Vascular;  Laterality: Left;  . Insertion of dialysis catheter Right 10/07/2013    Procedure: INSERTION OF DIALYSIS CATHETER;  Surgeon: Conrad Silverton, MD;  Location: [facility];  Service: Vascular;   Laterality: Right;    Physical exam:  Filed Vitals:   12/19/13 1212  BP: 137/64  Pulse: 71  Height: 4\' 3"  (1.295 m)  Weight: 192 lb (87.091 kg)  SpO2: 100%    Lower extremities: Bilateral well-healed amputations  Upper extremity: Palpable thrill left upper arm left hand cool but well-perfused  Assessment: Chronic pain syndrome which is fairly debilitating to the patient. Maturing left upper arm basilic vein fistula. She will eventually need second stage transposition of her left arm fistula. However, her lower extremity pain issues are intolerable for her currently and she does not wish to undergo an operation until this is better controlled.  Plan: Referral pain management clinic. Will perform duplex ultrasound of her fistula and consider second stage transposition in her pain issues are controlled.  Ruta Hinds, MD [facility] Office: [phone] Pager: [phone]

## 2013-12-20 DIAGNOSIS — D509 Iron deficiency anemia, unspecified: Secondary | ICD-10-CM | POA: Diagnosis not present

## 2013-12-20 DIAGNOSIS — Z23 Encounter for immunization: Secondary | ICD-10-CM | POA: Diagnosis not present

## 2013-12-20 DIAGNOSIS — Z992 Dependence on renal dialysis: Secondary | ICD-10-CM | POA: Diagnosis not present

## 2013-12-20 DIAGNOSIS — N2581 Secondary hyperparathyroidism of renal origin: Secondary | ICD-10-CM | POA: Diagnosis not present

## 2013-12-20 DIAGNOSIS — N186 End stage renal disease: Secondary | ICD-10-CM | POA: Diagnosis not present

## 2013-12-21 DIAGNOSIS — N186 End stage renal disease: Secondary | ICD-10-CM | POA: Diagnosis not present

## 2013-12-21 DIAGNOSIS — Z23 Encounter for immunization: Secondary | ICD-10-CM | POA: Diagnosis not present

## 2013-12-21 DIAGNOSIS — Z992 Dependence on renal dialysis: Secondary | ICD-10-CM | POA: Diagnosis not present

## 2013-12-21 DIAGNOSIS — D509 Iron deficiency anemia, unspecified: Secondary | ICD-10-CM | POA: Diagnosis not present

## 2013-12-21 DIAGNOSIS — N2581 Secondary hyperparathyroidism of renal origin: Secondary | ICD-10-CM | POA: Diagnosis not present

## 2013-12-23 DIAGNOSIS — Z992 Dependence on renal dialysis: Secondary | ICD-10-CM | POA: Diagnosis not present

## 2013-12-23 DIAGNOSIS — Z23 Encounter for immunization: Secondary | ICD-10-CM | POA: Diagnosis not present

## 2013-12-23 DIAGNOSIS — D509 Iron deficiency anemia, unspecified: Secondary | ICD-10-CM | POA: Diagnosis not present

## 2013-12-23 DIAGNOSIS — N2581 Secondary hyperparathyroidism of renal origin: Secondary | ICD-10-CM | POA: Diagnosis not present

## 2013-12-23 DIAGNOSIS — N186 End stage renal disease: Secondary | ICD-10-CM | POA: Diagnosis not present

## 2013-12-25 ENCOUNTER — Telehealth: Payer: Self-pay | Admitting: Family Medicine

## 2013-12-25 DIAGNOSIS — N2581 Secondary hyperparathyroidism of renal origin: Secondary | ICD-10-CM | POA: Diagnosis not present

## 2013-12-25 DIAGNOSIS — Z992 Dependence on renal dialysis: Secondary | ICD-10-CM | POA: Diagnosis not present

## 2013-12-25 DIAGNOSIS — N186 End stage renal disease: Secondary | ICD-10-CM | POA: Diagnosis not present

## 2013-12-25 DIAGNOSIS — Z23 Encounter for immunization: Secondary | ICD-10-CM | POA: Diagnosis not present

## 2013-12-25 DIAGNOSIS — D509 Iron deficiency anemia, unspecified: Secondary | ICD-10-CM | POA: Diagnosis not present

## 2013-12-25 NOTE — Telephone Encounter (Signed)
Patient said [facility], has sent multiple faxes about repairing wheelchair for patient and they need your approval via fax before they can address the issue. Patient said it is necessary for her to get to other necessary appointments and to do everyday activities as well and says its urgent that the fax gets sent back.

## 2013-12-27 DIAGNOSIS — N2581 Secondary hyperparathyroidism of renal origin: Secondary | ICD-10-CM | POA: Diagnosis not present

## 2013-12-27 DIAGNOSIS — D509 Iron deficiency anemia, unspecified: Secondary | ICD-10-CM | POA: Diagnosis not present

## 2013-12-27 DIAGNOSIS — Z23 Encounter for immunization: Secondary | ICD-10-CM | POA: Diagnosis not present

## 2013-12-27 DIAGNOSIS — N186 End stage renal disease: Secondary | ICD-10-CM | POA: Diagnosis not present

## 2013-12-27 DIAGNOSIS — Z992 Dependence on renal dialysis: Secondary | ICD-10-CM | POA: Diagnosis not present

## 2013-12-30 DIAGNOSIS — N186 End stage renal disease: Secondary | ICD-10-CM | POA: Diagnosis not present

## 2013-12-30 DIAGNOSIS — D509 Iron deficiency anemia, unspecified: Secondary | ICD-10-CM | POA: Diagnosis not present

## 2013-12-30 DIAGNOSIS — Z23 Encounter for immunization: Secondary | ICD-10-CM | POA: Diagnosis not present

## 2013-12-30 DIAGNOSIS — Z992 Dependence on renal dialysis: Secondary | ICD-10-CM | POA: Diagnosis not present

## 2013-12-30 DIAGNOSIS — N2581 Secondary hyperparathyroidism of renal origin: Secondary | ICD-10-CM | POA: Diagnosis not present

## 2013-12-31 DIAGNOSIS — G9009 Other idiopathic peripheral autonomic neuropathy: Secondary | ICD-10-CM | POA: Diagnosis not present

## 2013-12-31 DIAGNOSIS — G541 Lumbosacral plexus disorders: Secondary | ICD-10-CM | POA: Diagnosis not present

## 2013-12-31 DIAGNOSIS — M545 Low back pain, unspecified: Secondary | ICD-10-CM | POA: Diagnosis not present

## 2013-12-31 DIAGNOSIS — M79609 Pain in unspecified limb: Secondary | ICD-10-CM | POA: Diagnosis not present

## 2014-01-01 DIAGNOSIS — Z992 Dependence on renal dialysis: Secondary | ICD-10-CM | POA: Diagnosis not present

## 2014-01-01 DIAGNOSIS — N186 End stage renal disease: Secondary | ICD-10-CM | POA: Diagnosis not present

## 2014-01-01 DIAGNOSIS — D509 Iron deficiency anemia, unspecified: Secondary | ICD-10-CM | POA: Diagnosis not present

## 2014-01-01 DIAGNOSIS — Z23 Encounter for immunization: Secondary | ICD-10-CM | POA: Diagnosis not present

## 2014-01-01 DIAGNOSIS — N2581 Secondary hyperparathyroidism of renal origin: Secondary | ICD-10-CM | POA: Diagnosis not present

## 2014-01-03 DIAGNOSIS — D509 Iron deficiency anemia, unspecified: Secondary | ICD-10-CM | POA: Diagnosis not present

## 2014-01-03 DIAGNOSIS — N2581 Secondary hyperparathyroidism of renal origin: Secondary | ICD-10-CM | POA: Diagnosis not present

## 2014-01-03 DIAGNOSIS — N186 End stage renal disease: Secondary | ICD-10-CM | POA: Diagnosis not present

## 2014-01-03 DIAGNOSIS — Z992 Dependence on renal dialysis: Secondary | ICD-10-CM | POA: Diagnosis not present

## 2014-01-03 DIAGNOSIS — Z23 Encounter for immunization: Secondary | ICD-10-CM | POA: Diagnosis not present

## 2014-01-06 DIAGNOSIS — D509 Iron deficiency anemia, unspecified: Secondary | ICD-10-CM | POA: Diagnosis not present

## 2014-01-06 DIAGNOSIS — N186 End stage renal disease: Secondary | ICD-10-CM | POA: Diagnosis not present

## 2014-01-06 DIAGNOSIS — Z992 Dependence on renal dialysis: Secondary | ICD-10-CM | POA: Diagnosis not present

## 2014-01-06 DIAGNOSIS — N2581 Secondary hyperparathyroidism of renal origin: Secondary | ICD-10-CM | POA: Diagnosis not present

## 2014-01-06 DIAGNOSIS — Z23 Encounter for immunization: Secondary | ICD-10-CM | POA: Diagnosis not present

## 2014-01-08 DIAGNOSIS — Z992 Dependence on renal dialysis: Secondary | ICD-10-CM | POA: Diagnosis not present

## 2014-01-08 DIAGNOSIS — N2581 Secondary hyperparathyroidism of renal origin: Secondary | ICD-10-CM | POA: Diagnosis not present

## 2014-01-08 DIAGNOSIS — N186 End stage renal disease: Secondary | ICD-10-CM | POA: Diagnosis not present

## 2014-01-08 DIAGNOSIS — Z23 Encounter for immunization: Secondary | ICD-10-CM | POA: Diagnosis not present

## 2014-01-08 DIAGNOSIS — D509 Iron deficiency anemia, unspecified: Secondary | ICD-10-CM | POA: Diagnosis not present

## 2014-01-10 DIAGNOSIS — D509 Iron deficiency anemia, unspecified: Secondary | ICD-10-CM | POA: Diagnosis not present

## 2014-01-10 DIAGNOSIS — N186 End stage renal disease: Secondary | ICD-10-CM | POA: Diagnosis not present

## 2014-01-10 DIAGNOSIS — Z23 Encounter for immunization: Secondary | ICD-10-CM | POA: Diagnosis not present

## 2014-01-10 DIAGNOSIS — Z992 Dependence on renal dialysis: Secondary | ICD-10-CM | POA: Diagnosis not present

## 2014-01-10 DIAGNOSIS — N2581 Secondary hyperparathyroidism of renal origin: Secondary | ICD-10-CM | POA: Diagnosis not present

## 2014-01-13 DIAGNOSIS — N2581 Secondary hyperparathyroidism of renal origin: Secondary | ICD-10-CM | POA: Diagnosis not present

## 2014-01-13 DIAGNOSIS — D509 Iron deficiency anemia, unspecified: Secondary | ICD-10-CM | POA: Diagnosis not present

## 2014-01-13 DIAGNOSIS — Z992 Dependence on renal dialysis: Secondary | ICD-10-CM | POA: Diagnosis not present

## 2014-01-13 DIAGNOSIS — N186 End stage renal disease: Secondary | ICD-10-CM | POA: Diagnosis not present

## 2014-01-13 DIAGNOSIS — Z23 Encounter for immunization: Secondary | ICD-10-CM | POA: Diagnosis not present

## 2014-01-14 DIAGNOSIS — M533 Sacrococcygeal disorders, not elsewhere classified: Secondary | ICD-10-CM | POA: Diagnosis not present

## 2014-01-14 DIAGNOSIS — R209 Unspecified disturbances of skin sensation: Secondary | ICD-10-CM | POA: Diagnosis not present

## 2014-01-14 DIAGNOSIS — G894 Chronic pain syndrome: Secondary | ICD-10-CM | POA: Diagnosis not present

## 2014-01-14 DIAGNOSIS — Z79899 Other long term (current) drug therapy: Secondary | ICD-10-CM | POA: Diagnosis not present

## 2014-01-14 DIAGNOSIS — N186 End stage renal disease: Secondary | ICD-10-CM | POA: Diagnosis not present

## 2014-01-14 DIAGNOSIS — M545 Low back pain, unspecified: Secondary | ICD-10-CM | POA: Diagnosis not present

## 2014-01-14 DIAGNOSIS — M5137 Other intervertebral disc degeneration, lumbosacral region: Secondary | ICD-10-CM | POA: Diagnosis not present

## 2014-01-15 DIAGNOSIS — Z992 Dependence on renal dialysis: Secondary | ICD-10-CM | POA: Diagnosis not present

## 2014-01-15 DIAGNOSIS — N2581 Secondary hyperparathyroidism of renal origin: Secondary | ICD-10-CM | POA: Diagnosis not present

## 2014-01-15 DIAGNOSIS — D509 Iron deficiency anemia, unspecified: Secondary | ICD-10-CM | POA: Diagnosis not present

## 2014-01-15 DIAGNOSIS — N186 End stage renal disease: Secondary | ICD-10-CM | POA: Diagnosis not present

## 2014-01-17 DIAGNOSIS — Z992 Dependence on renal dialysis: Secondary | ICD-10-CM | POA: Diagnosis not present

## 2014-01-17 DIAGNOSIS — N186 End stage renal disease: Secondary | ICD-10-CM | POA: Diagnosis not present

## 2014-01-17 DIAGNOSIS — D509 Iron deficiency anemia, unspecified: Secondary | ICD-10-CM | POA: Diagnosis not present

## 2014-01-17 DIAGNOSIS — N2581 Secondary hyperparathyroidism of renal origin: Secondary | ICD-10-CM | POA: Diagnosis not present

## 2014-01-20 DIAGNOSIS — N186 End stage renal disease: Secondary | ICD-10-CM | POA: Diagnosis not present

## 2014-01-20 DIAGNOSIS — Z992 Dependence on renal dialysis: Secondary | ICD-10-CM | POA: Diagnosis not present

## 2014-01-20 DIAGNOSIS — N2581 Secondary hyperparathyroidism of renal origin: Secondary | ICD-10-CM | POA: Diagnosis not present

## 2014-01-20 DIAGNOSIS — D509 Iron deficiency anemia, unspecified: Secondary | ICD-10-CM | POA: Diagnosis not present

## 2014-01-21 ENCOUNTER — Ambulatory Visit: Payer: Medicare Other | Admitting: Family Medicine

## 2014-01-21 DIAGNOSIS — M79609 Pain in unspecified limb: Secondary | ICD-10-CM | POA: Diagnosis not present

## 2014-01-22 DIAGNOSIS — E876 Hypokalemia: Secondary | ICD-10-CM | POA: Diagnosis not present

## 2014-01-22 DIAGNOSIS — K8020 Calculus of gallbladder without cholecystitis without obstruction: Secondary | ICD-10-CM | POA: Diagnosis not present

## 2014-01-22 DIAGNOSIS — N186 End stage renal disease: Secondary | ICD-10-CM | POA: Diagnosis present

## 2014-01-22 DIAGNOSIS — O909 Complication of the puerperium, unspecified: Secondary | ICD-10-CM | POA: Diagnosis not present

## 2014-01-22 DIAGNOSIS — Z888 Allergy status to other drugs, medicaments and biological substances status: Secondary | ICD-10-CM | POA: Diagnosis not present

## 2014-01-22 DIAGNOSIS — E871 Hypo-osmolality and hyponatremia: Secondary | ICD-10-CM | POA: Diagnosis not present

## 2014-01-22 DIAGNOSIS — IMO0002 Reserved for concepts with insufficient information to code with codable children: Secondary | ICD-10-CM | POA: Diagnosis not present

## 2014-01-22 DIAGNOSIS — Z79899 Other long term (current) drug therapy: Secondary | ICD-10-CM | POA: Diagnosis not present

## 2014-01-22 DIAGNOSIS — R0989 Other specified symptoms and signs involving the circulatory and respiratory systems: Secondary | ICD-10-CM | POA: Diagnosis not present

## 2014-01-22 DIAGNOSIS — F17200 Nicotine dependence, unspecified, uncomplicated: Secondary | ICD-10-CM | POA: Diagnosis present

## 2014-01-22 DIAGNOSIS — I120 Hypertensive chronic kidney disease with stage 5 chronic kidney disease or end stage renal disease: Secondary | ICD-10-CM | POA: Diagnosis not present

## 2014-01-22 DIAGNOSIS — I252 Old myocardial infarction: Secondary | ICD-10-CM | POA: Diagnosis not present

## 2014-01-22 DIAGNOSIS — K612 Anorectal abscess: Secondary | ICD-10-CM | POA: Diagnosis not present

## 2014-01-22 DIAGNOSIS — L02219 Cutaneous abscess of trunk, unspecified: Secondary | ICD-10-CM | POA: Diagnosis not present

## 2014-01-22 DIAGNOSIS — L089 Local infection of the skin and subcutaneous tissue, unspecified: Secondary | ICD-10-CM | POA: Diagnosis not present

## 2014-01-22 DIAGNOSIS — I10 Essential (primary) hypertension: Secondary | ICD-10-CM | POA: Diagnosis not present

## 2014-01-22 DIAGNOSIS — M899 Disorder of bone, unspecified: Secondary | ICD-10-CM | POA: Diagnosis present

## 2014-01-22 DIAGNOSIS — Z6841 Body Mass Index (BMI) 40.0 and over, adult: Secondary | ICD-10-CM | POA: Diagnosis not present

## 2014-01-22 DIAGNOSIS — A419 Sepsis, unspecified organism: Secondary | ICD-10-CM | POA: Diagnosis not present

## 2014-01-22 DIAGNOSIS — Z794 Long term (current) use of insulin: Secondary | ICD-10-CM | POA: Diagnosis not present

## 2014-01-22 DIAGNOSIS — D649 Anemia, unspecified: Secondary | ICD-10-CM | POA: Diagnosis present

## 2014-01-22 DIAGNOSIS — Z886 Allergy status to analgesic agent status: Secondary | ICD-10-CM | POA: Diagnosis not present

## 2014-01-22 DIAGNOSIS — I739 Peripheral vascular disease, unspecified: Secondary | ICD-10-CM | POA: Diagnosis not present

## 2014-01-22 DIAGNOSIS — E119 Type 2 diabetes mellitus without complications: Secondary | ICD-10-CM | POA: Diagnosis not present

## 2014-01-22 DIAGNOSIS — S88119A Complete traumatic amputation at level between knee and ankle, unspecified lower leg, initial encounter: Secondary | ICD-10-CM | POA: Diagnosis not present

## 2014-01-22 DIAGNOSIS — Z992 Dependence on renal dialysis: Secondary | ICD-10-CM | POA: Diagnosis not present

## 2014-01-22 DIAGNOSIS — J449 Chronic obstructive pulmonary disease, unspecified: Secondary | ICD-10-CM | POA: Diagnosis present

## 2014-01-22 DIAGNOSIS — N281 Cyst of kidney, acquired: Secondary | ICD-10-CM | POA: Diagnosis not present

## 2014-01-22 DIAGNOSIS — T148XXA Other injury of unspecified body region, initial encounter: Secondary | ICD-10-CM | POA: Diagnosis not present

## 2014-01-23 ENCOUNTER — Ambulatory Visit: Payer: Medicare Other | Admitting: Family Medicine

## 2014-01-23 ENCOUNTER — Ambulatory Visit: Payer: Medicare Other | Admitting: Vascular Surgery

## 2014-01-25 DIAGNOSIS — Z992 Dependence on renal dialysis: Secondary | ICD-10-CM | POA: Diagnosis not present

## 2014-01-25 DIAGNOSIS — I739 Peripheral vascular disease, unspecified: Secondary | ICD-10-CM | POA: Diagnosis not present

## 2014-01-25 DIAGNOSIS — J449 Chronic obstructive pulmonary disease, unspecified: Secondary | ICD-10-CM | POA: Diagnosis not present

## 2014-01-25 DIAGNOSIS — I120 Hypertensive chronic kidney disease with stage 5 chronic kidney disease or end stage renal disease: Secondary | ICD-10-CM | POA: Diagnosis not present

## 2014-01-25 DIAGNOSIS — N186 End stage renal disease: Secondary | ICD-10-CM | POA: Diagnosis not present

## 2014-01-25 DIAGNOSIS — K612 Anorectal abscess: Secondary | ICD-10-CM | POA: Diagnosis not present

## 2014-01-25 DIAGNOSIS — E119 Type 2 diabetes mellitus without complications: Secondary | ICD-10-CM | POA: Diagnosis not present

## 2014-01-25 DIAGNOSIS — S88119A Complete traumatic amputation at level between knee and ankle, unspecified lower leg, initial encounter: Secondary | ICD-10-CM | POA: Diagnosis not present

## 2014-01-25 DIAGNOSIS — Z794 Long term (current) use of insulin: Secondary | ICD-10-CM | POA: Diagnosis not present

## 2014-01-26 DIAGNOSIS — I739 Peripheral vascular disease, unspecified: Secondary | ICD-10-CM | POA: Diagnosis not present

## 2014-01-26 DIAGNOSIS — N186 End stage renal disease: Secondary | ICD-10-CM | POA: Diagnosis not present

## 2014-01-26 DIAGNOSIS — J449 Chronic obstructive pulmonary disease, unspecified: Secondary | ICD-10-CM | POA: Diagnosis not present

## 2014-01-26 DIAGNOSIS — I120 Hypertensive chronic kidney disease with stage 5 chronic kidney disease or end stage renal disease: Secondary | ICD-10-CM | POA: Diagnosis not present

## 2014-01-26 DIAGNOSIS — E119 Type 2 diabetes mellitus without complications: Secondary | ICD-10-CM | POA: Diagnosis not present

## 2014-01-26 DIAGNOSIS — K612 Anorectal abscess: Secondary | ICD-10-CM | POA: Diagnosis not present

## 2014-01-27 DIAGNOSIS — D509 Iron deficiency anemia, unspecified: Secondary | ICD-10-CM | POA: Diagnosis not present

## 2014-01-27 DIAGNOSIS — Z992 Dependence on renal dialysis: Secondary | ICD-10-CM | POA: Diagnosis not present

## 2014-01-27 DIAGNOSIS — J449 Chronic obstructive pulmonary disease, unspecified: Secondary | ICD-10-CM | POA: Diagnosis not present

## 2014-01-27 DIAGNOSIS — N2581 Secondary hyperparathyroidism of renal origin: Secondary | ICD-10-CM | POA: Diagnosis not present

## 2014-01-27 DIAGNOSIS — K612 Anorectal abscess: Secondary | ICD-10-CM | POA: Diagnosis not present

## 2014-01-27 DIAGNOSIS — I739 Peripheral vascular disease, unspecified: Secondary | ICD-10-CM | POA: Diagnosis not present

## 2014-01-27 DIAGNOSIS — I120 Hypertensive chronic kidney disease with stage 5 chronic kidney disease or end stage renal disease: Secondary | ICD-10-CM | POA: Diagnosis not present

## 2014-01-27 DIAGNOSIS — N186 End stage renal disease: Secondary | ICD-10-CM | POA: Diagnosis not present

## 2014-01-27 DIAGNOSIS — E119 Type 2 diabetes mellitus without complications: Secondary | ICD-10-CM | POA: Diagnosis not present

## 2014-01-29 ENCOUNTER — Encounter: Payer: Self-pay | Admitting: Vascular Surgery

## 2014-01-29 DIAGNOSIS — D509 Iron deficiency anemia, unspecified: Secondary | ICD-10-CM | POA: Diagnosis not present

## 2014-01-29 DIAGNOSIS — Z992 Dependence on renal dialysis: Secondary | ICD-10-CM | POA: Diagnosis not present

## 2014-01-29 DIAGNOSIS — N2581 Secondary hyperparathyroidism of renal origin: Secondary | ICD-10-CM | POA: Diagnosis not present

## 2014-01-29 DIAGNOSIS — N186 End stage renal disease: Secondary | ICD-10-CM | POA: Diagnosis not present

## 2014-01-30 ENCOUNTER — Ambulatory Visit: Payer: Medicare Other | Admitting: Vascular Surgery

## 2014-01-30 DIAGNOSIS — J449 Chronic obstructive pulmonary disease, unspecified: Secondary | ICD-10-CM | POA: Diagnosis not present

## 2014-01-30 DIAGNOSIS — I120 Hypertensive chronic kidney disease with stage 5 chronic kidney disease or end stage renal disease: Secondary | ICD-10-CM | POA: Diagnosis not present

## 2014-01-30 DIAGNOSIS — K612 Anorectal abscess: Secondary | ICD-10-CM | POA: Diagnosis not present

## 2014-01-30 DIAGNOSIS — E119 Type 2 diabetes mellitus without complications: Secondary | ICD-10-CM | POA: Diagnosis not present

## 2014-01-30 DIAGNOSIS — I739 Peripheral vascular disease, unspecified: Secondary | ICD-10-CM | POA: Diagnosis not present

## 2014-01-30 DIAGNOSIS — N186 End stage renal disease: Secondary | ICD-10-CM | POA: Diagnosis not present

## 2014-01-31 DIAGNOSIS — D509 Iron deficiency anemia, unspecified: Secondary | ICD-10-CM | POA: Diagnosis not present

## 2014-01-31 DIAGNOSIS — N186 End stage renal disease: Secondary | ICD-10-CM | POA: Diagnosis not present

## 2014-01-31 DIAGNOSIS — N2581 Secondary hyperparathyroidism of renal origin: Secondary | ICD-10-CM | POA: Diagnosis not present

## 2014-01-31 DIAGNOSIS — Z992 Dependence on renal dialysis: Secondary | ICD-10-CM | POA: Diagnosis not present

## 2014-02-03 ENCOUNTER — Ambulatory Visit (HOSPITAL_COMMUNITY): Admission: RE | Admit: 2014-02-03 | Payer: Medicare Other | Source: Ambulatory Visit | Admitting: Specialist

## 2014-02-03 DIAGNOSIS — D509 Iron deficiency anemia, unspecified: Secondary | ICD-10-CM | POA: Diagnosis not present

## 2014-02-03 DIAGNOSIS — Z992 Dependence on renal dialysis: Secondary | ICD-10-CM | POA: Diagnosis not present

## 2014-02-03 DIAGNOSIS — N186 End stage renal disease: Secondary | ICD-10-CM | POA: Diagnosis not present

## 2014-02-03 DIAGNOSIS — N2581 Secondary hyperparathyroidism of renal origin: Secondary | ICD-10-CM | POA: Diagnosis not present

## 2014-02-04 DIAGNOSIS — N186 End stage renal disease: Secondary | ICD-10-CM | POA: Diagnosis not present

## 2014-02-04 DIAGNOSIS — N2581 Secondary hyperparathyroidism of renal origin: Secondary | ICD-10-CM | POA: Diagnosis not present

## 2014-02-04 DIAGNOSIS — Z992 Dependence on renal dialysis: Secondary | ICD-10-CM | POA: Diagnosis not present

## 2014-02-04 DIAGNOSIS — D509 Iron deficiency anemia, unspecified: Secondary | ICD-10-CM | POA: Diagnosis not present

## 2014-02-05 ENCOUNTER — Encounter: Payer: Self-pay | Admitting: Vascular Surgery

## 2014-02-06 ENCOUNTER — Encounter: Payer: Self-pay | Admitting: Vascular Surgery

## 2014-02-06 ENCOUNTER — Ambulatory Visit (INDEPENDENT_AMBULATORY_CARE_PROVIDER_SITE_OTHER): Payer: Medicare Other | Admitting: Vascular Surgery

## 2014-02-06 VITALS — BP 158/64 | HR 63 | Ht <= 58 in | Wt 181.0 lb

## 2014-02-06 DIAGNOSIS — N186 End stage renal disease: Secondary | ICD-10-CM

## 2014-02-06 NOTE — Progress Notes (Signed)
Patient is a 43 year old female returns for followup today after a first stage left basilic vein transposition fistula. This was 09/24/2013. She is currently on dialysis via a right-sided catheter. She does have some intermittent numbness and tingling in her left hand. This is primarily in digits one and 2. Occasionally occurs in digits 34 and 5. She denies ulceration on the tips of her fingers. She does have some coolness of the hand. She dialyzes Monday Wednesday Friday.  She also complains of pain in her bilateral below-knee amputation stumps as well as both hips. She apparently has had multiple bypass procedures previously in [place]. I reviewed her records which showed multiple prior bypasses in her lower extremities ultimately ending an amputation bilaterally.  She describes burning aching and pain in both of her below-knee amputation stumps which is chronic in nature. She is followed in the pain clinic for this.    Past Medical History   Diagnosis  Date   .  Coronary artery disease     .  Hypertension     .  Myocardial infarction  1998   .  Environmental allergies         uses inhalers   .  Diabetes mellitus without complication         Type 1   .  Umbilical hernia     .  Headache(784.0)     .  Multiple sclerosis         just diagnosed 2014   .  Peripheral vascular disease         aortic artery occlusion   .  Chronic kidney disease         End stage renal disease, will be starting dialysis on 10/08/13       Past Surgical History   Procedure  Laterality  Date   .  Ble amputation bk       .  Abdominal surgery    2006       aortic artery occluded, had to "clean it out"   .  Cardiac stents       .  Cardiac catheterization       .  Coronary angioplasty       .  Av fistula placement  Left  09/24/2013       Procedure: LEFT UPPER EXTREMITY ARTERIOVENOUS (AV) FISTULA CREATION BRACHIAL/CEPHALIC;  Surgeon: Elam Dutch, MD;  Location: [facility];  Service: Vascular;  Laterality: Left;   .   Insertion of dialysis catheter  Right  10/07/2013       Procedure: INSERTION OF DIALYSIS CATHETER;  Surgeon: Conrad Oneida, MD;  Location: [facility] OR;  Service: Vascular;  Laterality: Right;     Physical exam:     Filed Vitals:   02/06/14 1003  BP: 158/64  Pulse: 63  Height: 4\' 3"  (1.295 m)  Weight: 181 lb (82.101 kg)  SpO2: 100%   Upper extremity: Palpable thrill left upper arm left hand cool but well-perfused  Assessment: Maturing left upper arm basilic vein fistula.  Plan: Second stage basilic vein transposition 02/18/2014. Risks benefits possible complications and procedure details were explained the patient today including but not limited to bleeding infection non-maturation of fistula. She understands and agrees to proceed.  Ruta Hinds, MD [facility] [place] Office: [phone] Pager: [phone]

## 2014-02-08 DIAGNOSIS — D509 Iron deficiency anemia, unspecified: Secondary | ICD-10-CM | POA: Diagnosis not present

## 2014-02-08 DIAGNOSIS — Z992 Dependence on renal dialysis: Secondary | ICD-10-CM | POA: Diagnosis not present

## 2014-02-08 DIAGNOSIS — N186 End stage renal disease: Secondary | ICD-10-CM | POA: Diagnosis not present

## 2014-02-08 DIAGNOSIS — N2581 Secondary hyperparathyroidism of renal origin: Secondary | ICD-10-CM | POA: Diagnosis not present

## 2014-02-10 ENCOUNTER — Other Ambulatory Visit: Payer: Self-pay | Admitting: *Deleted

## 2014-02-10 DIAGNOSIS — N2581 Secondary hyperparathyroidism of renal origin: Secondary | ICD-10-CM | POA: Diagnosis not present

## 2014-02-10 DIAGNOSIS — N186 End stage renal disease: Secondary | ICD-10-CM | POA: Diagnosis not present

## 2014-02-10 DIAGNOSIS — D509 Iron deficiency anemia, unspecified: Secondary | ICD-10-CM | POA: Diagnosis not present

## 2014-02-10 DIAGNOSIS — Z992 Dependence on renal dialysis: Secondary | ICD-10-CM | POA: Diagnosis not present

## 2014-02-11 ENCOUNTER — Telehealth: Payer: Self-pay | Admitting: *Deleted

## 2014-02-11 DIAGNOSIS — M545 Low back pain, unspecified: Secondary | ICD-10-CM | POA: Diagnosis not present

## 2014-02-11 DIAGNOSIS — Z79899 Other long term (current) drug therapy: Secondary | ICD-10-CM | POA: Diagnosis not present

## 2014-02-11 DIAGNOSIS — M533 Sacrococcygeal disorders, not elsewhere classified: Secondary | ICD-10-CM | POA: Diagnosis not present

## 2014-02-11 DIAGNOSIS — M5137 Other intervertebral disc degeneration, lumbosacral region: Secondary | ICD-10-CM | POA: Diagnosis not present

## 2014-02-11 NOTE — Telephone Encounter (Signed)
Message copied by Mena Goes on Tue Feb 11, 2014  9:12 AM ------      Message from: Elam Dutch      Created: Tue Feb 11, 2014  7:35 AM      Regarding: RE: Plavix       Hold as of today.  Ok to take aspirin instead            Juanda Crumble      ----- Message -----         From: Mena Goes, CMA         Sent: 02/10/2014   1:12 PM           To: Elam Dutch, MD      Subject: Plavix                                                   How many days to you want to hold Plavix prior to her 2nd stage BVT on 02-18-14?       Zigmund Daniel       ------

## 2014-02-11 NOTE — Telephone Encounter (Signed)
Patient was given surgery information including the attached instructions from Dr. Oneida Alar.

## 2014-02-12 DIAGNOSIS — D509 Iron deficiency anemia, unspecified: Secondary | ICD-10-CM | POA: Diagnosis not present

## 2014-02-12 DIAGNOSIS — N2581 Secondary hyperparathyroidism of renal origin: Secondary | ICD-10-CM | POA: Diagnosis not present

## 2014-02-12 DIAGNOSIS — Z992 Dependence on renal dialysis: Secondary | ICD-10-CM | POA: Diagnosis not present

## 2014-02-12 DIAGNOSIS — N186 End stage renal disease: Secondary | ICD-10-CM | POA: Diagnosis not present

## 2014-02-13 DIAGNOSIS — I120 Hypertensive chronic kidney disease with stage 5 chronic kidney disease or end stage renal disease: Secondary | ICD-10-CM | POA: Diagnosis not present

## 2014-02-13 DIAGNOSIS — K612 Anorectal abscess: Secondary | ICD-10-CM | POA: Diagnosis not present

## 2014-02-13 DIAGNOSIS — N186 End stage renal disease: Secondary | ICD-10-CM | POA: Diagnosis not present

## 2014-02-13 DIAGNOSIS — E119 Type 2 diabetes mellitus without complications: Secondary | ICD-10-CM | POA: Diagnosis not present

## 2014-02-13 DIAGNOSIS — J449 Chronic obstructive pulmonary disease, unspecified: Secondary | ICD-10-CM | POA: Diagnosis not present

## 2014-02-13 DIAGNOSIS — I739 Peripheral vascular disease, unspecified: Secondary | ICD-10-CM | POA: Diagnosis not present

## 2014-02-14 DIAGNOSIS — D509 Iron deficiency anemia, unspecified: Secondary | ICD-10-CM | POA: Diagnosis not present

## 2014-02-14 DIAGNOSIS — Z992 Dependence on renal dialysis: Secondary | ICD-10-CM | POA: Diagnosis not present

## 2014-02-14 DIAGNOSIS — N186 End stage renal disease: Secondary | ICD-10-CM | POA: Diagnosis not present

## 2014-02-14 DIAGNOSIS — N2581 Secondary hyperparathyroidism of renal origin: Secondary | ICD-10-CM | POA: Diagnosis not present

## 2014-02-17 ENCOUNTER — Encounter (HOSPITAL_COMMUNITY): Payer: Self-pay | Admitting: *Deleted

## 2014-02-17 MED ORDER — DEXTROSE 5 % IV SOLN
1.5000 g | INTRAVENOUS | Status: AC
  Administered 2014-02-18: 1.5 g via INTRAVENOUS
  Filled 2014-02-17: qty 1.5

## 2014-02-17 MED ORDER — CHLORHEXIDINE GLUCONATE CLOTH 2 % EX PADS: 6.0000 | MEDICATED_PAD | Freq: Once | CUTANEOUS | Status: DC

## 2014-02-18 ENCOUNTER — Telehealth: Payer: Self-pay | Admitting: Vascular Surgery

## 2014-02-18 ENCOUNTER — Encounter (HOSPITAL_COMMUNITY): Payer: Medicare Other | Admitting: Anesthesiology

## 2014-02-18 ENCOUNTER — Ambulatory Visit (HOSPITAL_COMMUNITY)
Admission: RE | Admit: 2014-02-18 | Discharge: 2014-02-18 | Disposition: A | Discharge status: Home / Self Care | Payer: Medicare Other | Source: Ambulatory Visit | Attending: Vascular Surgery | Admitting: Vascular Surgery

## 2014-02-18 ENCOUNTER — Encounter (HOSPITAL_COMMUNITY): Payer: Self-pay | Admitting: *Deleted

## 2014-02-18 ENCOUNTER — Ambulatory Visit (HOSPITAL_COMMUNITY): Payer: Medicare Other | Admitting: Anesthesiology

## 2014-02-18 ENCOUNTER — Encounter (HOSPITAL_COMMUNITY)
Admission: RE | Disposition: A | Discharge status: Home / Self Care | Payer: Self-pay | Source: Ambulatory Visit | Attending: Vascular Surgery

## 2014-02-18 DIAGNOSIS — G35 Multiple sclerosis: Secondary | ICD-10-CM | POA: Insufficient documentation

## 2014-02-18 DIAGNOSIS — E119 Type 2 diabetes mellitus without complications: Secondary | ICD-10-CM | POA: Insufficient documentation

## 2014-02-18 DIAGNOSIS — N19 Unspecified kidney failure: Secondary | ICD-10-CM

## 2014-02-18 DIAGNOSIS — I10 Essential (primary) hypertension: Secondary | ICD-10-CM | POA: Diagnosis not present

## 2014-02-18 DIAGNOSIS — I252 Old myocardial infarction: Secondary | ICD-10-CM | POA: Diagnosis not present

## 2014-02-18 DIAGNOSIS — I120 Hypertensive chronic kidney disease with stage 5 chronic kidney disease or end stage renal disease: Secondary | ICD-10-CM | POA: Insufficient documentation

## 2014-02-18 DIAGNOSIS — N186 End stage renal disease: Secondary | ICD-10-CM | POA: Insufficient documentation

## 2014-02-18 DIAGNOSIS — Z9861 Coronary angioplasty status: Secondary | ICD-10-CM | POA: Insufficient documentation

## 2014-02-18 DIAGNOSIS — S88119A Complete traumatic amputation at level between knee and ankle, unspecified lower leg, initial encounter: Secondary | ICD-10-CM | POA: Diagnosis not present

## 2014-02-18 DIAGNOSIS — I2510 Atherosclerotic heart disease of native coronary artery without angina pectoris: Secondary | ICD-10-CM | POA: Diagnosis not present

## 2014-02-18 HISTORY — PX: BASCILIC VEIN TRANSPOSITION: SHX5742

## 2014-02-18 HISTORY — DX: Nonalcoholic steatohepatitis (NASH): K75.81

## 2014-02-18 HISTORY — DX: Other specified postprocedural states: Z98.890

## 2014-02-18 HISTORY — DX: Other specified postprocedural states: R11.2

## 2014-02-18 LAB — POCT I-STAT 4, (NA,K, GLUC, HGB,HCT)
Glucose, Bld: 200 mg/dL — ABNORMAL HIGH (ref 70–99)
HEMATOCRIT: 38 % (ref 36.0–46.0)
HEMOGLOBIN: 12.9 g/dL (ref 12.0–15.0)
Potassium: 3.9 mEq/L (ref 3.7–5.3)
Sodium: 135 mEq/L — ABNORMAL LOW (ref 137–147)

## 2014-02-18 LAB — GLUCOSE, CAPILLARY: Glucose-Capillary: 108 mg/dL — ABNORMAL HIGH (ref 70–99)

## 2014-02-18 LAB — HCG, SERUM, QUALITATIVE: Preg, Serum: NEGATIVE

## 2014-02-18 SURGERY — TRANSPOSITION, VEIN, BASILIC
Anesthesia: General | Site: Arm Upper | Laterality: Left

## 2014-02-18 MED ORDER — EPHEDRINE SULFATE 50 MG/ML IJ SOLN
INTRAMUSCULAR | Status: DC | PRN
  Administered 2014-02-18 (×2): 5 mg via INTRAVENOUS

## 2014-02-18 MED ORDER — DEXTROSE 5 % IV SOLN
INTRAVENOUS | Status: DC | PRN
Start: 2014-02-18 — End: 2014-02-18
  Administered 2014-02-18: 08:00:00 via INTRAVENOUS

## 2014-02-18 MED ORDER — MIDAZOLAM HCL 2 MG/2ML IJ SOLN
INTRAMUSCULAR | Status: AC
  Filled 2014-02-18: qty 2

## 2014-02-18 MED ORDER — MIDAZOLAM HCL 2 MG/2ML IJ SOLN: 0.5000 mg | Freq: Once | INTRAMUSCULAR | Status: DC | PRN

## 2014-02-18 MED ORDER — PROPOFOL 10 MG/ML IV BOLUS
INTRAVENOUS | Status: AC
  Filled 2014-02-18: qty 20

## 2014-02-18 MED ORDER — FENTANYL CITRATE 0.05 MG/ML IJ SOLN
INTRAMUSCULAR | Status: DC | PRN
  Administered 2014-02-18 (×2): 25 ug via INTRAVENOUS
  Administered 2014-02-18: 75 ug via INTRAVENOUS
  Administered 2014-02-18: 25 ug via INTRAVENOUS

## 2014-02-18 MED ORDER — LIDOCAINE HCL (CARDIAC) 20 MG/ML IV SOLN
INTRAVENOUS | Status: DC | PRN
  Administered 2014-02-18: 25 mg via INTRAVENOUS

## 2014-02-18 MED ORDER — ONDANSETRON HCL 4 MG/2ML IJ SOLN
INTRAMUSCULAR | Status: DC | PRN
  Administered 2014-02-18: 4 mg via INTRAVENOUS

## 2014-02-18 MED ORDER — SODIUM CHLORIDE 0.9 % IJ SOLN
INTRAMUSCULAR | Status: AC
  Filled 2014-02-18: qty 10

## 2014-02-18 MED ORDER — FENTANYL CITRATE 0.05 MG/ML IJ SOLN
INTRAMUSCULAR | Status: AC
  Filled 2014-02-18: qty 5

## 2014-02-18 MED ORDER — OXYCODONE HCL 5 MG PO TABS
5.0000 mg | ORAL_TABLET | Freq: Once | ORAL | Status: AC | PRN
  Administered 2014-02-18: 5 mg via ORAL

## 2014-02-18 MED ORDER — MEPERIDINE HCL 25 MG/ML IJ SOLN: 6.2500 mg | INTRAMUSCULAR | Status: DC | PRN

## 2014-02-18 MED ORDER — SODIUM CHLORIDE 0.9 % IR SOLN
Status: DC | PRN
  Administered 2014-02-18: 07:00:00

## 2014-02-18 MED ORDER — FENTANYL CITRATE 0.05 MG/ML IJ SOLN
25.0000 ug | INTRAMUSCULAR | Status: DC | PRN
  Administered 2014-02-18 (×3): 25 ug via INTRAVENOUS

## 2014-02-18 MED ORDER — FENTANYL CITRATE 0.05 MG/ML IJ SOLN
INTRAMUSCULAR | Status: AC
  Filled 2014-02-18: qty 2

## 2014-02-18 MED ORDER — LIDOCAINE HCL (PF) 1 % IJ SOLN
INTRAMUSCULAR | Status: AC
  Filled 2014-02-18: qty 30

## 2014-02-18 MED ORDER — PROTAMINE SULFATE 10 MG/ML IV SOLN
INTRAVENOUS | Status: DC | PRN
  Administered 2014-02-18 (×8): 10 mg via INTRAVENOUS

## 2014-02-18 MED ORDER — LIDOCAINE HCL (CARDIAC) 20 MG/ML IV SOLN
INTRAVENOUS | Status: AC
  Filled 2014-02-18: qty 5

## 2014-02-18 MED ORDER — 0.9 % SODIUM CHLORIDE (POUR BTL) OPTIME
TOPICAL | Status: DC | PRN
  Administered 2014-02-18: 1000 mL

## 2014-02-18 MED ORDER — EPHEDRINE SULFATE 50 MG/ML IJ SOLN
INTRAMUSCULAR | Status: AC
  Filled 2014-02-18: qty 1

## 2014-02-18 MED ORDER — OXYCODONE HCL 5 MG PO TABS
ORAL_TABLET | ORAL | Status: AC
  Filled 2014-02-18: qty 1

## 2014-02-18 MED ORDER — HEPARIN SODIUM (PORCINE) 1000 UNIT/ML IJ SOLN
INTRAMUSCULAR | Status: DC | PRN
  Administered 2014-02-18: 8000 [IU] via INTRAVENOUS

## 2014-02-18 MED ORDER — SODIUM CHLORIDE 0.9 % IV SOLN
INTRAVENOUS | Status: DC
  Administered 2014-02-18 (×2): via INTRAVENOUS

## 2014-02-18 MED ORDER — PROPOFOL 10 MG/ML IV BOLUS
INTRAVENOUS | Status: DC | PRN
  Administered 2014-02-18: 120 mg via INTRAVENOUS

## 2014-02-18 MED ORDER — ONDANSETRON HCL 4 MG/2ML IJ SOLN
INTRAMUSCULAR | Status: AC
  Filled 2014-02-18: qty 2

## 2014-02-18 MED ORDER — OXYCODONE HCL 5 MG/5ML PO SOLN
5.0000 mg | Freq: Once | ORAL | Status: AC | PRN
Start: 2014-02-18 — End: 2014-02-18

## 2014-02-18 MED ORDER — MIDAZOLAM HCL 5 MG/5ML IJ SOLN
INTRAMUSCULAR | Status: DC | PRN
  Administered 2014-02-18 (×2): 0.5 mg via INTRAVENOUS

## 2014-02-18 MED ORDER — THROMBIN 20000 UNITS EX SOLR
CUTANEOUS | Status: AC
  Filled 2014-02-18: qty 20000

## 2014-02-18 MED ORDER — HEPARIN SODIUM (PORCINE) 1000 UNIT/ML IJ SOLN
INTRAMUSCULAR | Status: AC
  Filled 2014-02-18: qty 1

## 2014-02-18 MED ORDER — OXYCODONE-ACETAMINOPHEN 5-325 MG PO TABS: 2.0000 | ORAL_TABLET | Freq: Four times a day (QID) | ORAL | Status: DC | PRN

## 2014-02-18 MED ORDER — PROMETHAZINE HCL 25 MG/ML IJ SOLN: 6.2500 mg | INTRAMUSCULAR | Status: DC | PRN

## 2014-02-18 SURGICAL SUPPLY — 39 items
BLADE 10 SAFETY STRL DISP (BLADE) IMPLANT
CANISTER SUCTION 2500CC (MISCELLANEOUS) ×2 IMPLANT
CLIP TI MEDIUM 24 (CLIP) ×2 IMPLANT
CLIP TI WIDE RED SMALL 24 (CLIP) ×2 IMPLANT
COVER PROBE W GEL 5X96 (DRAPES) ×2 IMPLANT
COVER SURGICAL LIGHT HANDLE (MISCELLANEOUS) ×2 IMPLANT
DECANTER SPIKE VIAL GLASS SM (MISCELLANEOUS) ×2 IMPLANT
DERMABOND ADVANCED (GAUZE/BANDAGES/DRESSINGS) ×2
DERMABOND ADVANCED .7 DNX12 (GAUZE/BANDAGES/DRESSINGS) ×2 IMPLANT
ELECT REM PT RETURN 9FT ADLT (ELECTROSURGICAL) ×2
ELECTRODE REM PT RTRN 9FT ADLT (ELECTROSURGICAL) ×1 IMPLANT
GEL ULTRASOUND 20GR AQUASONIC (MISCELLANEOUS) IMPLANT
GLOVE BIO SURGEON STRL SZ 6.5 (GLOVE) ×2 IMPLANT
GLOVE BIO SURGEON STRL SZ7.5 (GLOVE) ×2 IMPLANT
GLOVE BIOGEL PI IND STRL 6.5 (GLOVE) ×2 IMPLANT
GLOVE BIOGEL PI IND STRL 7.0 (GLOVE) ×3 IMPLANT
GLOVE BIOGEL PI INDICATOR 6.5 (GLOVE) ×2
GLOVE BIOGEL PI INDICATOR 7.0 (GLOVE) ×3
GLOVE ECLIPSE 6.0 STRL STRAW (GLOVE) ×2 IMPLANT
GOWN STRL REUS W/ TWL LRG LVL3 (GOWN DISPOSABLE) ×3 IMPLANT
GOWN STRL REUS W/TWL LRG LVL3 (GOWN DISPOSABLE) ×3
KIT BASIN OR (CUSTOM PROCEDURE TRAY) ×2 IMPLANT
KIT ROOM TURNOVER OR (KITS) ×2 IMPLANT
LOOP VESSEL MINI RED (MISCELLANEOUS) IMPLANT
NS IRRIG 1000ML POUR BTL (IV SOLUTION) ×2 IMPLANT
PACK CV ACCESS (CUSTOM PROCEDURE TRAY) ×2 IMPLANT
PAD ARMBOARD 7.5X6 YLW CONV (MISCELLANEOUS) ×4 IMPLANT
SPONGE SURGIFOAM ABS GEL 100 (HEMOSTASIS) IMPLANT
SUT PROLENE 7 0 BV 1 (SUTURE) ×4 IMPLANT
SUT SILK 2 0 SH (SUTURE) ×2 IMPLANT
SUT SILK 3 0 (SUTURE) ×1
SUT SILK 3-0 18XBRD TIE 12 (SUTURE) ×1 IMPLANT
SUT VIC AB 3-0 SH 27 (SUTURE) ×4
SUT VIC AB 3-0 SH 27X BRD (SUTURE) ×4 IMPLANT
SUT VICRYL 4-0 PS2 18IN ABS (SUTURE) ×8 IMPLANT
TOWEL OR 17X24 6PK STRL BLUE (TOWEL DISPOSABLE) ×4 IMPLANT
TOWEL OR 17X26 10 PK STRL BLUE (TOWEL DISPOSABLE) ×2 IMPLANT
UNDERPAD 30X30 INCONTINENT (UNDERPADS AND DIAPERS) ×2 IMPLANT
WATER STERILE IRR 1000ML POUR (IV SOLUTION) ×2 IMPLANT

## 2014-02-18 NOTE — Op Note (Signed)
Procedure: 2nd stage basilic vein transposition fistula Preop: ESRD Postop: ESRD Anesthesia: General Asst: Remo Lipps RNFA Findings 8  Mm fistula  Operative details: After obtaining informed consent, the patient was taken to the operating room. The patient was placed in supine position on the operating room table. After administration of general anesthesia, the patient's entire left upper extremity was prepped and draped in usual sterile fashion. A longitudinal incision was made in the left antecubital area. Incision was carried down through the subcutaneous tissues down to the level of the preexisting basilic to brachial artery fistula. The vein was approximately 8 mm in diameter. The fistula was dissected free circumferentially and side branches ligated and divided between silk ties.  Four additional longitudinal incisions were made to dissect out the vein to the level of the axilla.  Side branches were ligated and divided between silk ties.   The artery was dissected free circumferentially above and below the anastomosis and vessel loops placed around it.  A subcutaneous tunnel was created over the biceps muscle connecting the antecubital to the axillary incision.  The patient was given 8000 units of heparin.  The artery was controlled proximally and distally and the old anastomosis taken down.  The vein was then marked for orientation and brought through the subcutaneous tunnel.  A new anastomosis was then constructed end of vein to side of artery using a running 7 0 Prolene suture.  Just prior to completion the artery was forebled backbled and thoroughly flushed.  The anastomosis was secured and clamps released.   There was a palpable thrill in the fistula immediately.  Hemostasis was obtained and 80 mg of protamine was administered.  The subcutaneous tissues of each incision were closed with a running 3 0 Vicryl.  The skin incisions were closed with a 4-0 Vicryl subcuticular stitch. Dermabond  was applied to all incisions.  The patient tolerated the procedure well and there were no complications. Instrument sponge and needle counts were correct at the end of the case. The patient was taken to the recovery room in stable condition.   Ruta Hinds, MD

## 2014-02-18 NOTE — Transfer of Care (Signed)
Immediate Anesthesia Transfer of Care Note  Patient: Angela Soto  Procedure(s) Performed: Procedure(s): BASILIC VEIN TRANSPOSITION - 2ND STAGE LEFT ARM (Left)  Patient Location: PACU  Anesthesia Type:General  Level of Consciousness: awake, sedated and patient cooperative  Airway & Oxygen Therapy: Patient Spontanous Breathing and Patient connected to nasal cannula oxygen  Post-op Assessment: Report given to PACU RN, Post -op Vital signs reviewed and stable and Patient moving all extremities  Post vital signs: Reviewed and stable  Complications: No apparent anesthesia complications

## 2014-02-18 NOTE — Interval H&P Note (Signed)
History and Physical Interval Note:  02/18/2014 7:31 AM  Angela Soto  has presented today for surgery, with the diagnosis of ESRD  The various methods of treatment have been discussed with the patient and family. After consideration of risks, benefits and other options for treatment, the patient has consented to  Procedure(s): BASILIC VEIN TRANSPOSITION - 2ND STAGE LEFT ARM (Left) as a surgical intervention .  The patient's history has been reviewed, patient examined, no change in status, stable for surgery.  I have reviewed the patient's chart and labs.  Questions were answered to the patient's satisfaction.     Elam Dutch

## 2014-02-18 NOTE — Anesthesia Preprocedure Evaluation (Addendum)
Anesthesia Evaluation  Patient identified by MRN, date of birth, ID band Patient awake    Reviewed: Allergy & Precautions, H&P , NPO status , Patient's Chart, lab work & pertinent test results, reviewed documented beta blocker date and time   History of Anesthesia Complications (+) PONVNegative for: history of anesthetic complications  Airway Mallampati: II TM Distance: >3 FB Neck ROM: Full    Dental  (+) Edentulous Upper, Dental Advisory Given   Pulmonary COPDCurrent Smoker,  breath sounds clear to auscultation        Cardiovascular hypertension, Pt. on medications and Pt. on home beta blockers + CAD, + Past MI, + Cardiac Stents ('98, '05) and + Peripheral Vascular Disease Rhythm:Regular Rate:Normal     Neuro/Psych  Headaches, Pain clinic patient.   GI/Hepatic negative GI ROS, (+) Hepatitis -, Unspecified  Endo/Other  diabetes (glu 200), Well Controlled, Type 2, Insulin DependentMorbid obesity  Renal/GU ESRF and DialysisRenal disease (K+ 3.9)Hemodialysis begun 10-09-2013.  Currently has diatek right  Chest.   M-W-Fr schedule     Musculoskeletal   Abdominal (+) + obese,   Peds  Hematology   Anesthesia Other Findings Pt presents with upper denture in place.  To ask Dr. Glennon Mac re: removal.  Reproductive/Obstetrics                       Anesthesia Physical Anesthesia Plan  ASA: III  Anesthesia Plan: General   Post-op Pain Management:    Induction: Intravenous  Airway Management Planned: LMA  Additional Equipment:   Intra-op Plan:   Post-operative Plan:   Informed Consent: I have reviewed the patients History and Physical, chart, labs and discussed the procedure including the risks, benefits and alternatives for the proposed anesthesia with the patient or authorized representative who has indicated his/her understanding and acceptance.   Dental advisory given  Plan Discussed with: CRNA  and Surgeon  Anesthesia Plan Comments: (Plan routine monitors, GA- LMA OK)        Anesthesia Quick Evaluation

## 2014-02-18 NOTE — Telephone Encounter (Addendum)
Message copied by Gena Fray on Tue Feb 18, 2014  1:54 PM ------      Message from: Mena Goes      Created: Tue Feb 18, 2014 12:10 PM      Regarding: Schedule                   ----- Message -----         From: Mena Goes, CMA         Sent: 02/18/2014  10:56 AM           To: Mena Goes, CMA                        ----- Message -----         From: Elam Dutch, MD         Sent: 02/18/2014  10:30 AM           To: Vvs Charge Pool            2nd stage basilic vein with revision of anastomosis      Nurse asst            Pt needs follow up in 2 weeks to recheck incisions            Juanda Crumble ------  02/18/14: lm for pt, dpm

## 2014-02-18 NOTE — H&P (View-Only) (Signed)
Patient is a 43 year old female returns for followup today after a first stage left basilic vein transposition fistula. This was 09/24/2013. She is currently on dialysis via a right-sided catheter. She does have some intermittent numbness and tingling in her left hand. This is primarily in digits one and 2. Occasionally occurs in digits 34 and 5. She denies ulceration on the tips of her fingers. She does have some coolness of the hand. She dialyzes Monday Wednesday Friday.  She also complains of pain in her bilateral below-knee amputation stumps as well as both hips. She apparently has had multiple bypass procedures previously in [place]. I reviewed her records which showed multiple prior bypasses in her lower extremities ultimately ending an amputation bilaterally.  She describes burning aching and pain in both of her below-knee amputation stumps which is chronic in nature. She is followed in the pain clinic for this.    Past Medical History   Diagnosis  Date   .  Coronary artery disease     .  Hypertension     .  Myocardial infarction  1998   .  Environmental allergies         uses inhalers   .  Diabetes mellitus without complication         Type 1   .  Umbilical hernia     .  Headache(784.0)     .  Multiple sclerosis         just diagnosed 2014   .  Peripheral vascular disease         aortic artery occlusion   .  Chronic kidney disease         End stage renal disease, will be starting dialysis on 10/08/13       Past Surgical History   Procedure  Laterality  Date   .  Ble amputation bk       .  Abdominal surgery    2006       aortic artery occluded, had to "clean it out"   .  Cardiac stents       .  Cardiac catheterization       .  Coronary angioplasty       .  Av fistula placement  Left  09/24/2013       Procedure: LEFT UPPER EXTREMITY ARTERIOVENOUS (AV) FISTULA CREATION BRACHIAL/CEPHALIC;  Surgeon: Elam Dutch, MD;  Location: [facility];  Service: Vascular;  Laterality: Left;   .   Insertion of dialysis catheter  Right  10/07/2013       Procedure: INSERTION OF DIALYSIS CATHETER;  Surgeon: Conrad Callender Lake, MD;  Location: [facility] OR;  Service: Vascular;  Laterality: Right;     Physical exam:     Filed Vitals:   02/06/14 1003  BP: 158/64  Pulse: 63  Height: 4\' 3"  (1.295 m)  Weight: 181 lb (82.101 kg)  SpO2: 100%   Upper extremity: Palpable thrill left upper arm left hand cool but well-perfused  Assessment: Maturing left upper arm basilic vein fistula.  Plan: Second stage basilic vein transposition 02/18/2014. Risks benefits possible complications and procedure details were explained the patient today including but not limited to bleeding infection non-maturation of fistula. She understands and agrees to proceed.  Ruta Hinds, MD [facility] [place] Office: [phone] Pager: [phone]

## 2014-02-18 NOTE — Anesthesia Procedure Notes (Signed)
Procedure Name: LMA Insertion Date/Time: 02/18/2014 7:44 AM Performed by: Williemae Area B Pre-anesthesia Checklist: Patient identified, Emergency Drugs available, Suction available and Patient being monitored Patient Re-evaluated:Patient Re-evaluated prior to inductionOxygen Delivery Method: Circle system utilized Preoxygenation: Pre-oxygenation with 100% oxygen Intubation Type: IV induction Ventilation: Mask ventilation without difficulty LMA: LMA inserted LMA Size: 4.0 Number of attempts: 1 Placement Confirmation: breath sounds checked- equal and bilateral and positive ETCO2 Tube secured with: Tape (taped across cheeks; gauze roll b/t teeth) Dental Injury: Teeth and Oropharynx as per pre-operative assessment

## 2014-02-18 NOTE — Anesthesia Postprocedure Evaluation (Signed)
  Anesthesia Post-op Note  Patient: Angela Soto  Procedure(s) Performed: Procedure(s): BASILIC VEIN TRANSPOSITION - 2ND STAGE LEFT ARM (Left)  Patient Location: PACU  Anesthesia Type:General  Level of Consciousness: awake, alert , oriented and patient cooperative  Airway and Oxygen Therapy: Patient Spontanous Breathing  Post-op Pain: mild  Post-op Assessment: Post-op Vital signs reviewed, Patient's Cardiovascular Status Stable, Respiratory Function Stable, Patent Airway, No signs of Nausea or vomiting, Adequate PO intake and Pain level controlled  Post-op Vital Signs: Reviewed and stable  Last Vitals:  Filed Vitals:   02/18/14 1145  BP:   Pulse: 66  Temp:   Resp: 15    Complications: No apparent anesthesia complications

## 2014-02-18 NOTE — Progress Notes (Signed)
Rec'd report from [place], assuming care of patient for lunch relief

## 2014-02-20 DIAGNOSIS — N186 End stage renal disease: Secondary | ICD-10-CM | POA: Diagnosis not present

## 2014-02-20 DIAGNOSIS — Z992 Dependence on renal dialysis: Secondary | ICD-10-CM | POA: Diagnosis not present

## 2014-02-21 ENCOUNTER — Encounter (HOSPITAL_COMMUNITY): Payer: Self-pay | Admitting: Vascular Surgery

## 2014-02-25 DIAGNOSIS — N186 End stage renal disease: Secondary | ICD-10-CM | POA: Diagnosis not present

## 2014-02-25 DIAGNOSIS — E119 Type 2 diabetes mellitus without complications: Secondary | ICD-10-CM | POA: Diagnosis not present

## 2014-02-25 DIAGNOSIS — I120 Hypertensive chronic kidney disease with stage 5 chronic kidney disease or end stage renal disease: Secondary | ICD-10-CM | POA: Diagnosis not present

## 2014-02-25 DIAGNOSIS — I739 Peripheral vascular disease, unspecified: Secondary | ICD-10-CM | POA: Diagnosis not present

## 2014-02-25 DIAGNOSIS — J449 Chronic obstructive pulmonary disease, unspecified: Secondary | ICD-10-CM | POA: Diagnosis not present

## 2014-02-25 DIAGNOSIS — K612 Anorectal abscess: Secondary | ICD-10-CM | POA: Diagnosis not present

## 2014-03-04 DIAGNOSIS — N186 End stage renal disease: Secondary | ICD-10-CM | POA: Diagnosis not present

## 2014-03-04 DIAGNOSIS — I739 Peripheral vascular disease, unspecified: Secondary | ICD-10-CM | POA: Diagnosis not present

## 2014-03-04 DIAGNOSIS — E119 Type 2 diabetes mellitus without complications: Secondary | ICD-10-CM | POA: Diagnosis not present

## 2014-03-04 DIAGNOSIS — K612 Anorectal abscess: Secondary | ICD-10-CM | POA: Diagnosis not present

## 2014-03-04 DIAGNOSIS — J449 Chronic obstructive pulmonary disease, unspecified: Secondary | ICD-10-CM | POA: Diagnosis not present

## 2014-03-04 DIAGNOSIS — I120 Hypertensive chronic kidney disease with stage 5 chronic kidney disease or end stage renal disease: Secondary | ICD-10-CM | POA: Diagnosis not present

## 2014-03-06 ENCOUNTER — Encounter: Payer: Medicare Other | Admitting: Vascular Surgery

## 2014-03-11 DIAGNOSIS — Z79899 Other long term (current) drug therapy: Secondary | ICD-10-CM | POA: Diagnosis not present

## 2014-03-11 DIAGNOSIS — G894 Chronic pain syndrome: Secondary | ICD-10-CM | POA: Diagnosis not present

## 2014-03-11 DIAGNOSIS — M5137 Other intervertebral disc degeneration, lumbosacral region: Secondary | ICD-10-CM | POA: Diagnosis not present

## 2014-03-11 DIAGNOSIS — S335XXA Sprain of ligaments of lumbar spine, initial encounter: Secondary | ICD-10-CM | POA: Diagnosis not present

## 2014-03-11 DIAGNOSIS — M533 Sacrococcygeal disorders, not elsewhere classified: Secondary | ICD-10-CM | POA: Diagnosis not present

## 2014-03-11 DIAGNOSIS — M545 Low back pain, unspecified: Secondary | ICD-10-CM | POA: Diagnosis not present

## 2014-03-12 ENCOUNTER — Encounter: Payer: Self-pay | Admitting: Vascular Surgery

## 2014-03-13 ENCOUNTER — Ambulatory Visit (INDEPENDENT_AMBULATORY_CARE_PROVIDER_SITE_OTHER): Payer: Self-pay | Admitting: Vascular Surgery

## 2014-03-13 ENCOUNTER — Encounter: Payer: Self-pay | Admitting: Vascular Surgery

## 2014-03-13 VITALS — BP 143/62 | HR 65 | Ht <= 58 in | Wt 181.0 lb

## 2014-03-13 DIAGNOSIS — N186 End stage renal disease: Secondary | ICD-10-CM

## 2014-03-13 NOTE — Progress Notes (Signed)
Patient is a 43 year old female who underwent second stage basilic vein transposition on May 5. She returns today for followup. She denies any numbness or tingling in her hand. She currently is dialyzing via a right-sided catheter.  Physical exam:  Filed Vitals:   03/13/14 1032  BP: 143/62  Pulse: 65  Height: 4\' 3"  (1.295 m)  Weight: 181 lb (82.101 kg)  SpO2: 100%    Left upper extremity: Healed incision easily palpable thrill in fistula fistula is visible on the left upper arm  Assessment: Maturing left basilic vein transposition fistula  Plan: Fistula is ready to cannulate at this point. The patient will be scheduled for removal of her catheter once the fistula has been cannulated for one week.  Ruta Hinds, MD [facility] [place] Office: [phone] Pager: [phone]

## 2014-03-16 DIAGNOSIS — N186 End stage renal disease: Secondary | ICD-10-CM | POA: Diagnosis not present

## 2014-03-17 DIAGNOSIS — L0231 Cutaneous abscess of buttock: Secondary | ICD-10-CM | POA: Diagnosis not present

## 2014-03-17 DIAGNOSIS — D509 Iron deficiency anemia, unspecified: Secondary | ICD-10-CM | POA: Diagnosis not present

## 2014-03-17 DIAGNOSIS — N2581 Secondary hyperparathyroidism of renal origin: Secondary | ICD-10-CM | POA: Diagnosis not present

## 2014-03-17 DIAGNOSIS — L03317 Cellulitis of buttock: Secondary | ICD-10-CM | POA: Diagnosis not present

## 2014-03-17 DIAGNOSIS — N186 End stage renal disease: Secondary | ICD-10-CM | POA: Diagnosis not present

## 2014-03-17 DIAGNOSIS — Z992 Dependence on renal dialysis: Secondary | ICD-10-CM | POA: Diagnosis not present

## 2014-03-19 ENCOUNTER — Other Ambulatory Visit: Payer: Self-pay | Admitting: *Deleted

## 2014-03-19 DIAGNOSIS — Z992 Dependence on renal dialysis: Secondary | ICD-10-CM | POA: Diagnosis not present

## 2014-03-19 DIAGNOSIS — N186 End stage renal disease: Secondary | ICD-10-CM | POA: Diagnosis not present

## 2014-03-19 DIAGNOSIS — E119 Type 2 diabetes mellitus without complications: Secondary | ICD-10-CM | POA: Diagnosis not present

## 2014-03-19 DIAGNOSIS — Z794 Long term (current) use of insulin: Secondary | ICD-10-CM | POA: Diagnosis not present

## 2014-03-25 ENCOUNTER — Ambulatory Visit (HOSPITAL_COMMUNITY)
Admission: RE | Admit: 2014-03-25 | Discharge: 2014-03-25 | Disposition: A | Discharge status: Home / Self Care | Payer: Medicare Other | Source: Ambulatory Visit | Attending: Vascular Surgery | Admitting: Vascular Surgery

## 2014-03-25 DIAGNOSIS — Z794 Long term (current) use of insulin: Secondary | ICD-10-CM | POA: Diagnosis not present

## 2014-03-25 DIAGNOSIS — Z79899 Other long term (current) drug therapy: Secondary | ICD-10-CM | POA: Insufficient documentation

## 2014-03-25 DIAGNOSIS — I120 Hypertensive chronic kidney disease with stage 5 chronic kidney disease or end stage renal disease: Secondary | ICD-10-CM | POA: Diagnosis not present

## 2014-03-25 DIAGNOSIS — I252 Old myocardial infarction: Secondary | ICD-10-CM | POA: Insufficient documentation

## 2014-03-25 DIAGNOSIS — Z4901 Encounter for fitting and adjustment of extracorporeal dialysis catheter: Secondary | ICD-10-CM | POA: Diagnosis not present

## 2014-03-25 DIAGNOSIS — F17200 Nicotine dependence, unspecified, uncomplicated: Secondary | ICD-10-CM | POA: Diagnosis not present

## 2014-03-25 DIAGNOSIS — E119 Type 2 diabetes mellitus without complications: Secondary | ICD-10-CM | POA: Insufficient documentation

## 2014-03-25 DIAGNOSIS — N186 End stage renal disease: Secondary | ICD-10-CM | POA: Diagnosis not present

## 2014-03-25 NOTE — Progress Notes (Signed)
  [facility] Catheter Removal Procedure Note  Diagnosis: ESRD  Plan:  Remove right diatek catheter  Consent signed:  yes Time out completed:  yes Coumadin:  no PT/INR (if applicable):   Other labs:  Procedure: 1.  Sterile prepping and draping over catheter area 2. 7 ml 2% lidocaine plain instilled at removal site. 3.  right catheter removed in its entirety with cuff in tact. 4.  Complications:  none 5. Tip of catheter sent for culture:  no   Patient tolerated procedure well:  yes Pressure held, no bleeding noted, dressing applied Instructions given to the pt regarding wound care and bleeding.  Other:  Virgina Jock, PA-C 03/25/2014 2:41 PM

## 2014-03-25 NOTE — Progress Notes (Signed)
[facility] SHORT STAY H&P  CC:  Catheter removal   HPI:  This is a 43 y.o. female here for diatek catheter removal.  She has a current working fistula.    Past Medical History  Diagnosis Date  . Coronary artery disease   . Hypertension   . Myocardial infarction 1998  . Environmental allergies     uses inhalers  . Diabetes mellitus without complication     Type 1  . Umbilical hernia   . Headache(784.0)   . Multiple sclerosis     just diagnosed 2014  . Peripheral vascular disease     aortic artery occlusion  . Chronic kidney disease     End stage renal disease, will be starting dialysis on 10/08/13  . Complication of anesthesia   . PONV (postoperative nausea and vomiting)   . Umbilical hernia   . Nonalcoholic steatohepatitis (NASH)     FH:  Non-Contributory  History   Social History  . Marital Status: Married    Spouse Name: N/A    Number of Children: N/A  . Years of Education: N/A   Occupational History  . Not on file.   Social History Main Topics  . Smoking status: Current Every Day Smoker -- 1.00 packs/day    Types: Cigarettes  . Smokeless tobacco: Never Used  . Alcohol Use: No  . Drug Use: No  . Sexual Activity: Not on file   Other Topics Concern  . Not on file   Social History Narrative  . No narrative on file    Allergies  Allergen Reactions  . Metformin And Related Diarrhea and Nausea And Vomiting  . Lisinopril Other (See Comments)    unknown  . Spironolactone Other (See Comments)    unknown  . Trimethoprim Other (See Comments)    unknown    Current Outpatient Prescriptions  Medication Sig Dispense Refill  . albuterol (PROVENTIL HFA;VENTOLIN HFA) 108 (90 BASE) MCG/ACT inhaler Inhale 1 puff into the lungs every 4 (four) hours as needed for wheezing or shortness of breath.       Marland Kitchen amLODipine (NORVASC) 10 MG tablet Take 10 mg by mouth daily.      Marland Kitchen b complex-vitamin c-folic acid (NEPHRO-VITE) 0.8 MG TABS tablet Take 1  tablet by mouth daily.      . calcium acetate, Phos Binder, (PHOSLYRA) 667 MG/5ML SOLN Take 5 mLs (667 mg total) by mouth 3 (three) times daily with meals.  1350 mL  1  . Carboxymethylcellulose Sodium (THERATEARS) 0.25 % SOLN Apply 2 drops to eye every 4 (four) hours.      . clopidogrel (PLAVIX) 75 MG tablet Take 75 mg by mouth daily.      . ergocalciferol (VITAMIN D2) 50000 UNITS capsule Take 20,000 Units by mouth daily. fridays      . FOLIC ACID PO Take 1 tablet by mouth daily.      . furosemide (LASIX) 80 MG tablet Take 1 tablet (80 mg total) by mouth 2 (two) times daily.  60 tablet  0  . hydrALAZINE (APRESOLINE) 25 MG tablet Take 75 mg by mouth 3 (three) times daily.       . insulin aspart (NOVOLOG) 100 UNIT/ML injection Inject 14 Units into the skin 3 (three) times daily with meals.      . insulin glargine (LANTUS) 100 UNIT/ML injection Inject 0.3 mLs (30 Units total) into the skin at bedtime.  10 mL  12  . metoprolol (TOPROL-XL) 200 MG 24 hr  tablet Take 200 mg by mouth 2 (two) times daily.      . Misc. Devices (WHEELCHAIR CUSHION) MISC 1 Units by Does not apply route daily.  1 each  0  . oxyCODONE (ROXICODONE) 5 MG immediate release tablet Take 1 tablet (5 mg total) by mouth every 4 (four) hours as needed for severe pain.  15 tablet  0  . oxyCODONE-acetaminophen (PERCOCET/ROXICET) 5-325 MG per tablet Take 2 tablets by mouth every 6 (six) hours as needed.  30 tablet  0  . promethazine (PHENERGAN) 25 MG tablet Take 1 tablet (25 mg total) by mouth every 8 (eight) hours as needed for nausea or vomiting.  20 tablet  0  . sodium polystyrene (KAYEXALATE) 15 GM/60ML suspension Take 60 mLs (15 g total) by mouth every other day.  500 mL  0   No current facility-administered medications for this encounter.    ROS:  See HPI  PHYSICAL EXAM  Filed Vitals:   03/25/14 1303  BP: 138/55  Pulse: 66  Temp:   Resp: 20    Gen:  Well developed well nourished HEENT:  normocephalic Lungs:   Non-labored Skin:  No obvious rashes Neuro:  Alert and oriented.    Impression: This is a 43 y.o. female here for diatek catheter removal  Plan:  Removal of right diatek catheter  Virgina Jock, PA-C [facility] [phone] 03/25/2014 2:40 PM

## 2014-03-25 NOTE — Progress Notes (Signed)
Dressing to right upper chest CDI

## 2014-03-27 DIAGNOSIS — N185 Chronic kidney disease, stage 5: Secondary | ICD-10-CM | POA: Diagnosis not present

## 2014-04-08 DIAGNOSIS — M545 Low back pain, unspecified: Secondary | ICD-10-CM | POA: Diagnosis not present

## 2014-04-08 DIAGNOSIS — M5137 Other intervertebral disc degeneration, lumbosacral region: Secondary | ICD-10-CM | POA: Diagnosis not present

## 2014-04-08 DIAGNOSIS — Z79899 Other long term (current) drug therapy: Secondary | ICD-10-CM | POA: Diagnosis not present

## 2014-04-08 DIAGNOSIS — G894 Chronic pain syndrome: Secondary | ICD-10-CM | POA: Diagnosis not present

## 2014-04-10 DIAGNOSIS — N9089 Other specified noninflammatory disorders of vulva and perineum: Secondary | ICD-10-CM | POA: Diagnosis not present

## 2014-04-11 ENCOUNTER — Other Ambulatory Visit: Payer: Self-pay

## 2014-04-11 ENCOUNTER — Encounter (HOSPITAL_COMMUNITY): Payer: Self-pay | Admitting: Pharmacy Technician

## 2014-04-15 DIAGNOSIS — N186 End stage renal disease: Secondary | ICD-10-CM | POA: Diagnosis not present

## 2014-04-16 DIAGNOSIS — L0231 Cutaneous abscess of buttock: Secondary | ICD-10-CM | POA: Diagnosis not present

## 2014-04-16 DIAGNOSIS — N2581 Secondary hyperparathyroidism of renal origin: Secondary | ICD-10-CM | POA: Diagnosis not present

## 2014-04-16 DIAGNOSIS — Z23 Encounter for immunization: Secondary | ICD-10-CM | POA: Diagnosis not present

## 2014-04-16 DIAGNOSIS — Z992 Dependence on renal dialysis: Secondary | ICD-10-CM | POA: Diagnosis not present

## 2014-04-16 DIAGNOSIS — L03317 Cellulitis of buttock: Secondary | ICD-10-CM | POA: Diagnosis not present

## 2014-04-16 DIAGNOSIS — N186 End stage renal disease: Secondary | ICD-10-CM | POA: Diagnosis not present

## 2014-04-16 DIAGNOSIS — D509 Iron deficiency anemia, unspecified: Secondary | ICD-10-CM | POA: Diagnosis not present

## 2014-04-17 ENCOUNTER — Encounter (HOSPITAL_COMMUNITY)
Admission: RE | Disposition: A | Discharge status: Home / Self Care | Payer: Self-pay | Source: Ambulatory Visit | Attending: Vascular Surgery

## 2014-04-17 ENCOUNTER — Ambulatory Visit (HOSPITAL_COMMUNITY)
Admission: RE | Admit: 2014-04-17 | Discharge: 2014-04-17 | Disposition: A | Discharge status: Home / Self Care | Payer: Medicare Other | Source: Ambulatory Visit | Attending: Vascular Surgery | Admitting: Vascular Surgery

## 2014-04-17 DIAGNOSIS — I2510 Atherosclerotic heart disease of native coronary artery without angina pectoris: Secondary | ICD-10-CM | POA: Insufficient documentation

## 2014-04-17 DIAGNOSIS — F17200 Nicotine dependence, unspecified, uncomplicated: Secondary | ICD-10-CM | POA: Insufficient documentation

## 2014-04-17 DIAGNOSIS — N186 End stage renal disease: Secondary | ICD-10-CM | POA: Diagnosis not present

## 2014-04-17 DIAGNOSIS — Y832 Surgical operation with anastomosis, bypass or graft as the cause of abnormal reaction of the patient, or of later complication, without mention of misadventure at the time of the procedure: Secondary | ICD-10-CM | POA: Insufficient documentation

## 2014-04-17 DIAGNOSIS — K7689 Other specified diseases of liver: Secondary | ICD-10-CM | POA: Insufficient documentation

## 2014-04-17 DIAGNOSIS — T82898A Other specified complication of vascular prosthetic devices, implants and grafts, initial encounter: Secondary | ICD-10-CM | POA: Diagnosis not present

## 2014-04-17 DIAGNOSIS — Z9861 Coronary angioplasty status: Secondary | ICD-10-CM | POA: Insufficient documentation

## 2014-04-17 DIAGNOSIS — K429 Umbilical hernia without obstruction or gangrene: Secondary | ICD-10-CM | POA: Diagnosis not present

## 2014-04-17 DIAGNOSIS — G35 Multiple sclerosis: Secondary | ICD-10-CM | POA: Diagnosis not present

## 2014-04-17 DIAGNOSIS — S88119A Complete traumatic amputation at level between knee and ankle, unspecified lower leg, initial encounter: Secondary | ICD-10-CM | POA: Diagnosis not present

## 2014-04-17 DIAGNOSIS — Z7902 Long term (current) use of antithrombotics/antiplatelets: Secondary | ICD-10-CM | POA: Diagnosis not present

## 2014-04-17 DIAGNOSIS — Z992 Dependence on renal dialysis: Secondary | ICD-10-CM | POA: Insufficient documentation

## 2014-04-17 DIAGNOSIS — T82598A Other mechanical complication of other cardiac and vascular devices and implants, initial encounter: Secondary | ICD-10-CM | POA: Insufficient documentation

## 2014-04-17 DIAGNOSIS — E109 Type 1 diabetes mellitus without complications: Secondary | ICD-10-CM | POA: Insufficient documentation

## 2014-04-17 DIAGNOSIS — I252 Old myocardial infarction: Secondary | ICD-10-CM | POA: Insufficient documentation

## 2014-04-17 DIAGNOSIS — I700 Atherosclerosis of aorta: Secondary | ICD-10-CM | POA: Insufficient documentation

## 2014-04-17 DIAGNOSIS — I120 Hypertensive chronic kidney disease with stage 5 chronic kidney disease or end stage renal disease: Secondary | ICD-10-CM | POA: Insufficient documentation

## 2014-04-17 HISTORY — PX: SHUNTOGRAM: SHX5491

## 2014-04-17 LAB — POCT I-STAT, CHEM 8
BUN: 37 mg/dL — ABNORMAL HIGH (ref 6–23)
Calcium, Ion: 1.18 mmol/L (ref 1.12–1.23)
Chloride: 97 mEq/L (ref 96–112)
Creatinine, Ser: 2.4 mg/dL — ABNORMAL HIGH (ref 0.50–1.10)
Glucose, Bld: 202 mg/dL — ABNORMAL HIGH (ref 70–99)
HEMATOCRIT: 38 % (ref 36.0–46.0)
HEMOGLOBIN: 12.9 g/dL (ref 12.0–15.0)
Potassium: 3.3 mEq/L — ABNORMAL LOW (ref 3.7–5.3)
Sodium: 139 mEq/L (ref 137–147)
TCO2: 25 mmol/L (ref 0–100)

## 2014-04-17 SURGERY — ASSESSMENT, SHUNT FUNCTION, WITH CONTRAST RADIOGRAPHIC STUDY
Anesthesia: LOCAL | Laterality: Left

## 2014-04-17 MED ORDER — HEPARIN (PORCINE) IN NACL 2-0.9 UNIT/ML-% IJ SOLN
INTRAMUSCULAR | Status: AC
  Filled 2014-04-17: qty 1000

## 2014-04-17 MED ORDER — HEPARIN SODIUM (PORCINE) 1000 UNIT/ML IJ SOLN
INTRAMUSCULAR | Status: AC
  Filled 2014-04-17: qty 1

## 2014-04-17 MED ORDER — ACETAMINOPHEN 325 MG PO TABS
650.0000 mg | ORAL_TABLET | ORAL | Status: DC | PRN
Start: 2014-04-17 — End: 2014-04-17

## 2014-04-17 MED ORDER — MIDAZOLAM HCL 2 MG/2ML IJ SOLN
INTRAMUSCULAR | Status: AC
  Filled 2014-04-17: qty 2

## 2014-04-17 MED ORDER — SODIUM CHLORIDE 0.9 % IJ SOLN: 3.0000 mL | INTRAMUSCULAR | Status: DC | PRN

## 2014-04-17 MED ORDER — SODIUM CHLORIDE 0.9 % IJ SOLN: 3.0000 mL | Freq: Two times a day (BID) | INTRAMUSCULAR | Status: DC

## 2014-04-17 MED ORDER — FENTANYL CITRATE 0.05 MG/ML IJ SOLN
INTRAMUSCULAR | Status: AC
  Filled 2014-04-17: qty 2

## 2014-04-17 MED ORDER — SODIUM CHLORIDE 0.9 % IV SOLN: 250.0000 mL | INTRAVENOUS | Status: DC | PRN

## 2014-04-17 MED ORDER — LIDOCAINE HCL (PF) 1 % IJ SOLN
INTRAMUSCULAR | Status: AC
  Filled 2014-04-17: qty 30

## 2014-04-17 NOTE — H&P (Addendum)
Brief History and Physical  History of Present Illness  Angela Soto is a 43 y.o. female who presents with chief complaint: poor flow rates in L BVT.  The patient presents today for L arm fistulogram, possible intervention.   The patient continues to HD via the L BVT.  Past Medical History  Diagnosis Date  . Coronary artery disease   . Hypertension   . Myocardial infarction 1998  . Environmental allergies     uses inhalers  . Diabetes mellitus without complication     Type 1  . Umbilical hernia   . Headache(784.0)   . Multiple sclerosis     just diagnosed 2014  . Peripheral vascular disease     aortic artery occlusion  . Chronic kidney disease     End stage renal disease, will be starting dialysis on 10/08/13  . Complication of anesthesia   . PONV (postoperative nausea and vomiting)   . Umbilical hernia   . Nonalcoholic steatohepatitis (NASH)     Past Surgical History  Procedure Laterality Date  . Ble amputation bk    . Abdominal surgery  2006    aortic artery occluded, had to "clean it out"  . Cardiac stents    . Cardiac catheterization    . Coronary angioplasty    . Av fistula placement Left 09/24/2013    Procedure: LEFT UPPER EXTREMITY ARTERIOVENOUS (AV) FISTULA CREATION BRACHIAL/CEPHALIC;  Surgeon: Elam Dutch, MD;  Location: [place];  Service: Vascular;  Laterality: Left;  . Insertion of dialysis catheter Right 10/07/2013    Procedure: INSERTION OF DIALYSIS CATHETER;  Surgeon: Conrad Juniata, MD;  Location: [place];  Service: Vascular;  Laterality: Right;  . Irrigation and debridement abscess    . Bascilic vein transposition Left 02/18/2014    Procedure: BASILIC VEIN TRANSPOSITION - 2ND STAGE LEFT ARM;  Surgeon: Elam Dutch, MD;  Location: [place];  Service: Vascular;  Laterality: Left;    History   Social History  . Marital Status: Married    Spouse Name: N/A    Number of Children: N/A  . Years of Education: N/A   Occupational History  . Not on  file.   Social History Main Topics  . Smoking status: Current Every Day Smoker -- 1.00 packs/day    Types: Cigarettes  . Smokeless tobacco: Never Used  . Alcohol Use: No  . Drug Use: No  . Sexual Activity: Not on file   Other Topics Concern  . Not on file   Social History Narrative  . No narrative on file    Family History  Problem Relation Age of Onset  . Diabetes Mellitus II Mother   . Ovarian cancer Mother   . CAD Father   . Diabetes Mellitus II Father     No current facility-administered medications on file prior to encounter.   Current Outpatient Prescriptions on File Prior to Encounter  Medication Sig Dispense Refill  . amLODipine (NORVASC) 10 MG tablet Take 10 mg by mouth daily.      . clopidogrel (PLAVIX) 75 MG tablet Take 75 mg by mouth at bedtime.       . furosemide (LASIX) 80 MG tablet Take 1 tablet (80 mg total) by mouth 2 (two) times daily.  60 tablet  0  . hydrALAZINE (APRESOLINE) 25 MG tablet Take 75 mg by mouth 3 (three) times daily.       . metoprolol (TOPROL-XL) 200 MG 24 hr tablet Take 200 mg  by mouth 2 (two) times daily.      . Misc. Devices (WHEELCHAIR CUSHION) MISC 1 Units by Does not apply route daily.  1 each  0    Allergies  Allergen Reactions  . Metformin And Related Diarrhea and Nausea And Vomiting  . Ibuprofen Other (See Comments)    Cannot take because of low kidney function    Review of Systems: As listed above, otherwise negative.  Physical Examination  Filed Vitals:   04/17/14 0716  BP: 138/61  Pulse: 62  Temp: 98.5 F (36.9 C)  TempSrc: Oral  Resp: 20  Height: 4\' 3"  (1.295 m)  Weight: 169 lb (76.658 kg)  SpO2: 99%    General: A&O x 3, WDWN  Pulmonary: Sym exp, good air movt, CTAB, no rales, rhonchi, & wheezing  Cardiac: RRR, Nl S1, S2, no Murmurs, rubs or gallops  Gastrointestinal: soft, NTND, -G/R, - HSM, - masses, - CVAT B  Musculoskeletal: M/S 5/5 throughout , Extremities without ischemic changes , palpable  thrill in L UA , + bruit, fistula visibly appears to collapse  Laboratory See iStat  Medical Decision Making  Angela Soto is a 43 y.o. female who presents with: poor flow rate in L BVT.   The patient is scheduled for: L arm fistulogram, possible intervention I discussed with the patient the nature of angiographic procedures, especially the limited patencies of any endovascular intervention.  The patient is aware of that the risks of an angiographic procedure include but are not limited to: bleeding, infection, access site complications, renal failure, embolization, rupture of vessel, dissection, possible need for emergent surgical intervention, possible need for surgical procedures to treat the patient's pathology, and stroke and death.    The patient is aware of the risks and agrees to proceed.  Adele Barthel, MD [facility] [place] Office: [phone] Pager: [phone]  04/17/2014, 7:16 AM

## 2014-04-17 NOTE — Op Note (Signed)
    OPERATIVE NOTE   PROCEDURE: 1. left basilic vein transposition cannulation under ultrasound guidance 2. left arm fistulogram  PRE-OPERATIVE DIAGNOSIS: Malfunctioning left basilic vein transposition   POST-OPERATIVE DIAGNOSIS: same as above   SURGEON: Adele Barthel, MD  ANESTHESIA: local  ESTIMATED BLOOD LOSS: 5 cc  FINDING(S): 1. Widely patent basilic vein transposition without any hemodynamically significant stenoses 2. Multiple valves evident on study 3. Tapered distal fistula but still >4 mm  4. Central venous structures widely patent  SPECIMEN(S):  None  CONTRAST: 40 cc  INDICATIONS: Angela Soto is a 43 y.o. female who  presents with malfunctioning left basilic vein transposition.  The patient is scheduled for left arm fistulogram.  The patient is aware the risks include but are not limited to: bleeding, infection, thrombosis of the cannulated access, and possible anaphylactic reaction to the contrast.  The patient is aware of the risks of the procedure and elects to proceed forward.  DESCRIPTION: After full informed written consent was obtained, the patient was brought back to the angiography suite and placed supine upon the angiography table.  The patient was connected to monitoring equipment.  The left arm was prepped and draped in the standard fashion for a left arm fistulogram.  Under ultrasound guidance, the left basilic vein transposition was cannulated with a micropuncture needle.  The microwire was advanced into the fistula and the needle was exchanged for the a microsheath, which was lodged 2 cm into the access.  The wire was removed and the sheath was connected to the IV extension tubing.  Hand injections were completed to image the access from the antecubitum up to the level of axilla.  The central venous structures were also imaged by hand injections.  Based on the images, this patient will need: no intervention.  A 4-0 Monocryl purse-string suture was sewn  around the sheath.  The sheath was removed while tying down the suture.  A sterile bandage was applied to the puncture site.  Based on the images, consider changing cannulation position.  It is not clear to me if the cannulation is occurring near valves, which might partially occlude the cannula lumen.  COMPLICATIONS: none  CONDITION: stable  Adele Barthel, MD [facility] [place] Office: [phone] Pager: [phone]  04/17/2014 9:49 AM

## 2014-04-17 NOTE — Discharge Instructions (Signed)
AV Fistula Care After Refer to this sheet in the next few weeks. These instructions provide you with information on caring for yourself after your procedure. Your caregiver may also give you more specific instructions. Your treatment has been planned according to current medical practices, but problems sometimes occur. Call your caregiver if you have any problems or questions after your procedure. HOME CARE INSTRUCTIONS   Do not drive a car or take public transportation alone.  Do not drink alcohol.  Only take medicine that has been prescribed by your caregiver.  Do not sign important papers or make important decisions.  Have a responsible person with you.  Ask your caregiver to show you how to check your access at home for a vibration (called a "thrill") or for a sound (called a "bruit" pronounced brew-ee).  Your vein will need time to enlarge and mature so needles can be inserted for dialysis. Follow your caregiver's instructions about what you need to do to make this happen.  Keep dressings clean and dry.  Keep the arm elevated above your heart. Use a pillow.  Rest.  Use the arm as usual for all activities.  Have the stitches or tape closures removed in 10 to 14 days, or as directed by your caregiver.  Do not sleep or lie on the area of the fistula or that arm. This may decrease or stop the blood flow through your fistula.  Do not allow blood pressures to be taken on this arm.  Do not allow blood drawing to be done from the graft.  Do not wear tight clothing around the access site or on the arm.  Avoid lifting heavy objects with the arm that has the fistula.  Do not use creams or lotions over the access site. SEEK MEDICAL CARE IF:   You have a fever.  You have swelling around the fistula that gets worse, or you have new pain.  You have unusual bleeding at the fistula site or from any other area.  You have pus or other drainage at the fistula site.  You have skin  redness or red streaking on the skin around, above, or below the fistula site.  Your access site feels warm.  You have any flu-like symptoms. SEEK IMMEDIATE MEDICAL CARE IF:   You have pain, numbness, or an unusual pale skin on the hand or on the side of your fistula.  You have dizziness or weakness that you have not had before.  You have shortness of breath.  You have chest pain.  Your fistula disconnects or breaks, and there is bleeding that cannot be easily controlled. Call for local emergency medical help. Do not try to drive yourself to the hospital. MAKE SURE YOU  Understand these instructions.  Will watch your condition.  Will get help right away if you are not doing well or get worse. Document Released: 10/03/2005 Document Revised: 12/26/2011 Document Reviewed: 03/23/2011 [facility] Patient Information 2015 [place], [place]. This information is not intended to replace advice given to you by your health care provider. Make sure you discuss any questions you have with your health care provider.

## 2014-04-18 DIAGNOSIS — N2581 Secondary hyperparathyroidism of renal origin: Secondary | ICD-10-CM | POA: Diagnosis not present

## 2014-04-18 DIAGNOSIS — Z992 Dependence on renal dialysis: Secondary | ICD-10-CM | POA: Diagnosis not present

## 2014-04-18 DIAGNOSIS — N186 End stage renal disease: Secondary | ICD-10-CM | POA: Diagnosis not present

## 2014-04-18 DIAGNOSIS — L03317 Cellulitis of buttock: Secondary | ICD-10-CM | POA: Diagnosis not present

## 2014-04-18 DIAGNOSIS — D509 Iron deficiency anemia, unspecified: Secondary | ICD-10-CM | POA: Diagnosis not present

## 2014-04-18 DIAGNOSIS — L0231 Cutaneous abscess of buttock: Secondary | ICD-10-CM | POA: Diagnosis not present

## 2014-04-18 DIAGNOSIS — Z23 Encounter for immunization: Secondary | ICD-10-CM | POA: Diagnosis not present

## 2014-04-21 DIAGNOSIS — D509 Iron deficiency anemia, unspecified: Secondary | ICD-10-CM | POA: Diagnosis not present

## 2014-04-21 DIAGNOSIS — N186 End stage renal disease: Secondary | ICD-10-CM | POA: Diagnosis not present

## 2014-04-21 DIAGNOSIS — Z23 Encounter for immunization: Secondary | ICD-10-CM | POA: Diagnosis not present

## 2014-04-21 DIAGNOSIS — Z992 Dependence on renal dialysis: Secondary | ICD-10-CM | POA: Diagnosis not present

## 2014-04-21 DIAGNOSIS — L0231 Cutaneous abscess of buttock: Secondary | ICD-10-CM | POA: Diagnosis not present

## 2014-04-21 DIAGNOSIS — N2581 Secondary hyperparathyroidism of renal origin: Secondary | ICD-10-CM | POA: Diagnosis not present

## 2014-04-23 DIAGNOSIS — L0231 Cutaneous abscess of buttock: Secondary | ICD-10-CM | POA: Diagnosis not present

## 2014-04-23 DIAGNOSIS — D509 Iron deficiency anemia, unspecified: Secondary | ICD-10-CM | POA: Diagnosis not present

## 2014-04-23 DIAGNOSIS — N2581 Secondary hyperparathyroidism of renal origin: Secondary | ICD-10-CM | POA: Diagnosis not present

## 2014-04-23 DIAGNOSIS — Z992 Dependence on renal dialysis: Secondary | ICD-10-CM | POA: Diagnosis not present

## 2014-04-23 DIAGNOSIS — Z23 Encounter for immunization: Secondary | ICD-10-CM | POA: Diagnosis not present

## 2014-04-23 DIAGNOSIS — N186 End stage renal disease: Secondary | ICD-10-CM | POA: Diagnosis not present

## 2014-04-23 DIAGNOSIS — L03317 Cellulitis of buttock: Secondary | ICD-10-CM | POA: Diagnosis not present

## 2014-04-25 DIAGNOSIS — L0231 Cutaneous abscess of buttock: Secondary | ICD-10-CM | POA: Diagnosis not present

## 2014-04-25 DIAGNOSIS — Z23 Encounter for immunization: Secondary | ICD-10-CM | POA: Diagnosis not present

## 2014-04-25 DIAGNOSIS — Z992 Dependence on renal dialysis: Secondary | ICD-10-CM | POA: Diagnosis not present

## 2014-04-25 DIAGNOSIS — L03317 Cellulitis of buttock: Secondary | ICD-10-CM | POA: Diagnosis not present

## 2014-04-25 DIAGNOSIS — N2581 Secondary hyperparathyroidism of renal origin: Secondary | ICD-10-CM | POA: Diagnosis not present

## 2014-04-25 DIAGNOSIS — D509 Iron deficiency anemia, unspecified: Secondary | ICD-10-CM | POA: Diagnosis not present

## 2014-04-25 DIAGNOSIS — N186 End stage renal disease: Secondary | ICD-10-CM | POA: Diagnosis not present

## 2014-04-28 DIAGNOSIS — Z992 Dependence on renal dialysis: Secondary | ICD-10-CM | POA: Diagnosis not present

## 2014-04-28 DIAGNOSIS — L0231 Cutaneous abscess of buttock: Secondary | ICD-10-CM | POA: Diagnosis not present

## 2014-04-28 DIAGNOSIS — N2581 Secondary hyperparathyroidism of renal origin: Secondary | ICD-10-CM | POA: Diagnosis not present

## 2014-04-28 DIAGNOSIS — L03317 Cellulitis of buttock: Secondary | ICD-10-CM | POA: Diagnosis not present

## 2014-04-28 DIAGNOSIS — N186 End stage renal disease: Secondary | ICD-10-CM | POA: Diagnosis not present

## 2014-04-28 DIAGNOSIS — D509 Iron deficiency anemia, unspecified: Secondary | ICD-10-CM | POA: Diagnosis not present

## 2014-04-28 DIAGNOSIS — Z23 Encounter for immunization: Secondary | ICD-10-CM | POA: Diagnosis not present

## 2014-04-30 DIAGNOSIS — N186 End stage renal disease: Secondary | ICD-10-CM | POA: Diagnosis not present

## 2014-04-30 DIAGNOSIS — L0231 Cutaneous abscess of buttock: Secondary | ICD-10-CM | POA: Diagnosis not present

## 2014-04-30 DIAGNOSIS — N2581 Secondary hyperparathyroidism of renal origin: Secondary | ICD-10-CM | POA: Diagnosis not present

## 2014-04-30 DIAGNOSIS — Z992 Dependence on renal dialysis: Secondary | ICD-10-CM | POA: Diagnosis not present

## 2014-04-30 DIAGNOSIS — D509 Iron deficiency anemia, unspecified: Secondary | ICD-10-CM | POA: Diagnosis not present

## 2014-04-30 DIAGNOSIS — Z23 Encounter for immunization: Secondary | ICD-10-CM | POA: Diagnosis not present

## 2014-04-30 DIAGNOSIS — L03317 Cellulitis of buttock: Secondary | ICD-10-CM | POA: Diagnosis not present

## 2014-05-02 DIAGNOSIS — D509 Iron deficiency anemia, unspecified: Secondary | ICD-10-CM | POA: Diagnosis not present

## 2014-05-02 DIAGNOSIS — N186 End stage renal disease: Secondary | ICD-10-CM | POA: Diagnosis not present

## 2014-05-02 DIAGNOSIS — L0231 Cutaneous abscess of buttock: Secondary | ICD-10-CM | POA: Diagnosis not present

## 2014-05-02 DIAGNOSIS — Z23 Encounter for immunization: Secondary | ICD-10-CM | POA: Diagnosis not present

## 2014-05-02 DIAGNOSIS — N2581 Secondary hyperparathyroidism of renal origin: Secondary | ICD-10-CM | POA: Diagnosis not present

## 2014-05-02 DIAGNOSIS — Z992 Dependence on renal dialysis: Secondary | ICD-10-CM | POA: Diagnosis not present

## 2014-05-02 DIAGNOSIS — L03317 Cellulitis of buttock: Secondary | ICD-10-CM | POA: Diagnosis not present

## 2014-05-05 DIAGNOSIS — N186 End stage renal disease: Secondary | ICD-10-CM | POA: Diagnosis not present

## 2014-05-05 DIAGNOSIS — N2581 Secondary hyperparathyroidism of renal origin: Secondary | ICD-10-CM | POA: Diagnosis not present

## 2014-05-05 DIAGNOSIS — L03317 Cellulitis of buttock: Secondary | ICD-10-CM | POA: Diagnosis not present

## 2014-05-05 DIAGNOSIS — D509 Iron deficiency anemia, unspecified: Secondary | ICD-10-CM | POA: Diagnosis not present

## 2014-05-05 DIAGNOSIS — L0231 Cutaneous abscess of buttock: Secondary | ICD-10-CM | POA: Diagnosis not present

## 2014-05-05 DIAGNOSIS — Z23 Encounter for immunization: Secondary | ICD-10-CM | POA: Diagnosis not present

## 2014-05-05 DIAGNOSIS — Z992 Dependence on renal dialysis: Secondary | ICD-10-CM | POA: Diagnosis not present

## 2014-05-07 DIAGNOSIS — L0231 Cutaneous abscess of buttock: Secondary | ICD-10-CM | POA: Diagnosis not present

## 2014-05-07 DIAGNOSIS — N186 End stage renal disease: Secondary | ICD-10-CM | POA: Diagnosis not present

## 2014-05-07 DIAGNOSIS — Z23 Encounter for immunization: Secondary | ICD-10-CM | POA: Diagnosis not present

## 2014-05-07 DIAGNOSIS — L03317 Cellulitis of buttock: Secondary | ICD-10-CM | POA: Diagnosis not present

## 2014-05-07 DIAGNOSIS — Z992 Dependence on renal dialysis: Secondary | ICD-10-CM | POA: Diagnosis not present

## 2014-05-07 DIAGNOSIS — N2581 Secondary hyperparathyroidism of renal origin: Secondary | ICD-10-CM | POA: Diagnosis not present

## 2014-05-07 DIAGNOSIS — D509 Iron deficiency anemia, unspecified: Secondary | ICD-10-CM | POA: Diagnosis not present

## 2014-05-09 DIAGNOSIS — D509 Iron deficiency anemia, unspecified: Secondary | ICD-10-CM | POA: Diagnosis not present

## 2014-05-09 DIAGNOSIS — Z992 Dependence on renal dialysis: Secondary | ICD-10-CM | POA: Diagnosis not present

## 2014-05-09 DIAGNOSIS — N186 End stage renal disease: Secondary | ICD-10-CM | POA: Diagnosis not present

## 2014-05-09 DIAGNOSIS — Z23 Encounter for immunization: Secondary | ICD-10-CM | POA: Diagnosis not present

## 2014-05-09 DIAGNOSIS — N2581 Secondary hyperparathyroidism of renal origin: Secondary | ICD-10-CM | POA: Diagnosis not present

## 2014-05-09 DIAGNOSIS — L0231 Cutaneous abscess of buttock: Secondary | ICD-10-CM | POA: Diagnosis not present

## 2014-05-12 DIAGNOSIS — Z23 Encounter for immunization: Secondary | ICD-10-CM | POA: Diagnosis not present

## 2014-05-12 DIAGNOSIS — D509 Iron deficiency anemia, unspecified: Secondary | ICD-10-CM | POA: Diagnosis not present

## 2014-05-12 DIAGNOSIS — Z79899 Other long term (current) drug therapy: Secondary | ICD-10-CM | POA: Diagnosis not present

## 2014-05-12 DIAGNOSIS — M5137 Other intervertebral disc degeneration, lumbosacral region: Secondary | ICD-10-CM | POA: Diagnosis not present

## 2014-05-12 DIAGNOSIS — N2581 Secondary hyperparathyroidism of renal origin: Secondary | ICD-10-CM | POA: Diagnosis not present

## 2014-05-12 DIAGNOSIS — L03317 Cellulitis of buttock: Secondary | ICD-10-CM | POA: Diagnosis not present

## 2014-05-12 DIAGNOSIS — L0231 Cutaneous abscess of buttock: Secondary | ICD-10-CM | POA: Diagnosis not present

## 2014-05-12 DIAGNOSIS — M545 Low back pain, unspecified: Secondary | ICD-10-CM | POA: Diagnosis not present

## 2014-05-12 DIAGNOSIS — Z992 Dependence on renal dialysis: Secondary | ICD-10-CM | POA: Diagnosis not present

## 2014-05-12 DIAGNOSIS — G894 Chronic pain syndrome: Secondary | ICD-10-CM | POA: Diagnosis not present

## 2014-05-12 DIAGNOSIS — N186 End stage renal disease: Secondary | ICD-10-CM | POA: Diagnosis not present

## 2014-05-13 DIAGNOSIS — L03818 Cellulitis of other sites: Secondary | ICD-10-CM | POA: Diagnosis not present

## 2014-05-13 DIAGNOSIS — E109 Type 1 diabetes mellitus without complications: Secondary | ICD-10-CM | POA: Diagnosis not present

## 2014-05-13 DIAGNOSIS — M549 Dorsalgia, unspecified: Secondary | ICD-10-CM | POA: Diagnosis not present

## 2014-05-13 DIAGNOSIS — N186 End stage renal disease: Secondary | ICD-10-CM | POA: Diagnosis not present

## 2014-05-13 DIAGNOSIS — L02818 Cutaneous abscess of other sites: Secondary | ICD-10-CM | POA: Diagnosis not present

## 2014-05-13 DIAGNOSIS — I10 Essential (primary) hypertension: Secondary | ICD-10-CM | POA: Diagnosis not present

## 2014-05-13 DIAGNOSIS — I7389 Other specified peripheral vascular diseases: Secondary | ICD-10-CM | POA: Diagnosis not present

## 2014-05-14 DIAGNOSIS — D509 Iron deficiency anemia, unspecified: Secondary | ICD-10-CM | POA: Diagnosis not present

## 2014-05-14 DIAGNOSIS — Z23 Encounter for immunization: Secondary | ICD-10-CM | POA: Diagnosis not present

## 2014-05-14 DIAGNOSIS — L0231 Cutaneous abscess of buttock: Secondary | ICD-10-CM | POA: Diagnosis not present

## 2014-05-14 DIAGNOSIS — N186 End stage renal disease: Secondary | ICD-10-CM | POA: Diagnosis not present

## 2014-05-14 DIAGNOSIS — N2581 Secondary hyperparathyroidism of renal origin: Secondary | ICD-10-CM | POA: Diagnosis not present

## 2014-05-14 DIAGNOSIS — L03317 Cellulitis of buttock: Secondary | ICD-10-CM | POA: Diagnosis not present

## 2014-05-14 DIAGNOSIS — Z992 Dependence on renal dialysis: Secondary | ICD-10-CM | POA: Diagnosis not present

## 2014-05-16 DIAGNOSIS — N2581 Secondary hyperparathyroidism of renal origin: Secondary | ICD-10-CM | POA: Diagnosis not present

## 2014-05-16 DIAGNOSIS — Z23 Encounter for immunization: Secondary | ICD-10-CM | POA: Diagnosis not present

## 2014-05-16 DIAGNOSIS — Z992 Dependence on renal dialysis: Secondary | ICD-10-CM | POA: Diagnosis not present

## 2014-05-16 DIAGNOSIS — L0231 Cutaneous abscess of buttock: Secondary | ICD-10-CM | POA: Diagnosis not present

## 2014-05-16 DIAGNOSIS — D509 Iron deficiency anemia, unspecified: Secondary | ICD-10-CM | POA: Diagnosis not present

## 2014-05-16 DIAGNOSIS — N186 End stage renal disease: Secondary | ICD-10-CM | POA: Diagnosis not present

## 2014-05-19 DIAGNOSIS — Z992 Dependence on renal dialysis: Secondary | ICD-10-CM | POA: Diagnosis not present

## 2014-05-19 DIAGNOSIS — D509 Iron deficiency anemia, unspecified: Secondary | ICD-10-CM | POA: Diagnosis not present

## 2014-05-19 DIAGNOSIS — N186 End stage renal disease: Secondary | ICD-10-CM | POA: Diagnosis not present

## 2014-05-21 DIAGNOSIS — Z992 Dependence on renal dialysis: Secondary | ICD-10-CM | POA: Diagnosis not present

## 2014-05-21 DIAGNOSIS — N186 End stage renal disease: Secondary | ICD-10-CM | POA: Diagnosis not present

## 2014-05-29 ENCOUNTER — Other Ambulatory Visit (HOSPITAL_COMMUNITY): Payer: Self-pay | Admitting: Nephrology

## 2014-05-29 DIAGNOSIS — N186 End stage renal disease: Secondary | ICD-10-CM

## 2014-05-29 DIAGNOSIS — T829XXA Unspecified complication of cardiac and vascular prosthetic device, implant and graft, initial encounter: Secondary | ICD-10-CM

## 2014-05-29 DIAGNOSIS — Z992 Dependence on renal dialysis: Principal | ICD-10-CM

## 2014-06-03 ENCOUNTER — Ambulatory Visit (HOSPITAL_COMMUNITY)
Admission: RE | Admit: 2014-06-03 | Discharge: 2014-06-03 | Disposition: A | Discharge status: Home / Self Care | Payer: Medicare Other | Source: Ambulatory Visit | Attending: Nephrology | Admitting: Nephrology

## 2014-06-03 DIAGNOSIS — T829XXA Unspecified complication of cardiac and vascular prosthetic device, implant and graft, initial encounter: Secondary | ICD-10-CM

## 2014-06-03 DIAGNOSIS — T82898A Other specified complication of vascular prosthetic devices, implants and grafts, initial encounter: Secondary | ICD-10-CM | POA: Diagnosis not present

## 2014-06-03 DIAGNOSIS — Z0389 Encounter for observation for other suspected diseases and conditions ruled out: Secondary | ICD-10-CM | POA: Insufficient documentation

## 2014-06-03 MED ORDER — IOHEXOL 300 MG/ML  SOLN
100.0000 mL | Freq: Once | INTRAMUSCULAR | Status: AC | PRN
  Administered 2014-06-03: 50 mL via INTRAVENOUS

## 2014-06-03 NOTE — Procedures (Signed)
RUE AVF patent No comp

## 2014-06-10 DIAGNOSIS — Z79899 Other long term (current) drug therapy: Secondary | ICD-10-CM | POA: Diagnosis not present

## 2014-06-10 DIAGNOSIS — G894 Chronic pain syndrome: Secondary | ICD-10-CM | POA: Diagnosis not present

## 2014-06-10 DIAGNOSIS — M533 Sacrococcygeal disorders, not elsewhere classified: Secondary | ICD-10-CM | POA: Diagnosis not present

## 2014-06-10 DIAGNOSIS — M545 Low back pain, unspecified: Secondary | ICD-10-CM | POA: Diagnosis not present

## 2014-06-10 DIAGNOSIS — R209 Unspecified disturbances of skin sensation: Secondary | ICD-10-CM | POA: Diagnosis not present

## 2014-06-10 DIAGNOSIS — M5137 Other intervertebral disc degeneration, lumbosacral region: Secondary | ICD-10-CM | POA: Diagnosis not present

## 2014-06-16 DIAGNOSIS — N186 End stage renal disease: Secondary | ICD-10-CM | POA: Diagnosis not present

## 2014-06-18 DIAGNOSIS — Z992 Dependence on renal dialysis: Secondary | ICD-10-CM | POA: Diagnosis not present

## 2014-06-18 DIAGNOSIS — D509 Iron deficiency anemia, unspecified: Secondary | ICD-10-CM | POA: Diagnosis not present

## 2014-06-18 DIAGNOSIS — N2581 Secondary hyperparathyroidism of renal origin: Secondary | ICD-10-CM | POA: Diagnosis not present

## 2014-06-18 DIAGNOSIS — Z23 Encounter for immunization: Secondary | ICD-10-CM | POA: Diagnosis not present

## 2014-06-18 DIAGNOSIS — N186 End stage renal disease: Secondary | ICD-10-CM | POA: Diagnosis not present

## 2014-06-19 ENCOUNTER — Telehealth (HOSPITAL_COMMUNITY): Payer: Self-pay

## 2014-06-20 DIAGNOSIS — N2581 Secondary hyperparathyroidism of renal origin: Secondary | ICD-10-CM | POA: Diagnosis not present

## 2014-06-20 DIAGNOSIS — Z992 Dependence on renal dialysis: Secondary | ICD-10-CM | POA: Diagnosis not present

## 2014-06-20 DIAGNOSIS — D509 Iron deficiency anemia, unspecified: Secondary | ICD-10-CM | POA: Diagnosis not present

## 2014-06-20 DIAGNOSIS — N186 End stage renal disease: Secondary | ICD-10-CM | POA: Diagnosis not present

## 2014-06-20 DIAGNOSIS — Z23 Encounter for immunization: Secondary | ICD-10-CM | POA: Diagnosis not present

## 2014-06-23 DIAGNOSIS — N2581 Secondary hyperparathyroidism of renal origin: Secondary | ICD-10-CM | POA: Diagnosis not present

## 2014-06-23 DIAGNOSIS — Z992 Dependence on renal dialysis: Secondary | ICD-10-CM | POA: Diagnosis not present

## 2014-06-23 DIAGNOSIS — N186 End stage renal disease: Secondary | ICD-10-CM | POA: Diagnosis not present

## 2014-06-23 DIAGNOSIS — Z23 Encounter for immunization: Secondary | ICD-10-CM | POA: Diagnosis not present

## 2014-06-23 DIAGNOSIS — D509 Iron deficiency anemia, unspecified: Secondary | ICD-10-CM | POA: Diagnosis not present

## 2014-06-25 DIAGNOSIS — N2581 Secondary hyperparathyroidism of renal origin: Secondary | ICD-10-CM | POA: Diagnosis not present

## 2014-06-25 DIAGNOSIS — E119 Type 2 diabetes mellitus without complications: Secondary | ICD-10-CM | POA: Diagnosis not present

## 2014-06-25 DIAGNOSIS — Z794 Long term (current) use of insulin: Secondary | ICD-10-CM | POA: Diagnosis not present

## 2014-06-25 DIAGNOSIS — Z992 Dependence on renal dialysis: Secondary | ICD-10-CM | POA: Diagnosis not present

## 2014-06-25 DIAGNOSIS — N186 End stage renal disease: Secondary | ICD-10-CM | POA: Diagnosis not present

## 2014-06-25 DIAGNOSIS — D509 Iron deficiency anemia, unspecified: Secondary | ICD-10-CM | POA: Diagnosis not present

## 2014-06-25 DIAGNOSIS — Z23 Encounter for immunization: Secondary | ICD-10-CM | POA: Diagnosis not present

## 2014-06-27 DIAGNOSIS — D509 Iron deficiency anemia, unspecified: Secondary | ICD-10-CM | POA: Diagnosis not present

## 2014-06-27 DIAGNOSIS — Z23 Encounter for immunization: Secondary | ICD-10-CM | POA: Diagnosis not present

## 2014-06-27 DIAGNOSIS — N2581 Secondary hyperparathyroidism of renal origin: Secondary | ICD-10-CM | POA: Diagnosis not present

## 2014-06-27 DIAGNOSIS — Z992 Dependence on renal dialysis: Secondary | ICD-10-CM | POA: Diagnosis not present

## 2014-06-27 DIAGNOSIS — N186 End stage renal disease: Secondary | ICD-10-CM | POA: Diagnosis not present

## 2014-06-30 DIAGNOSIS — Z23 Encounter for immunization: Secondary | ICD-10-CM | POA: Diagnosis not present

## 2014-06-30 DIAGNOSIS — N2581 Secondary hyperparathyroidism of renal origin: Secondary | ICD-10-CM | POA: Diagnosis not present

## 2014-06-30 DIAGNOSIS — D509 Iron deficiency anemia, unspecified: Secondary | ICD-10-CM | POA: Diagnosis not present

## 2014-06-30 DIAGNOSIS — Z992 Dependence on renal dialysis: Secondary | ICD-10-CM | POA: Diagnosis not present

## 2014-06-30 DIAGNOSIS — N186 End stage renal disease: Secondary | ICD-10-CM | POA: Diagnosis not present

## 2014-07-01 DIAGNOSIS — L03317 Cellulitis of buttock: Secondary | ICD-10-CM | POA: Diagnosis not present

## 2014-07-01 DIAGNOSIS — L0231 Cutaneous abscess of buttock: Secondary | ICD-10-CM | POA: Diagnosis not present

## 2014-07-02 DIAGNOSIS — Z992 Dependence on renal dialysis: Secondary | ICD-10-CM | POA: Diagnosis not present

## 2014-07-02 DIAGNOSIS — N2581 Secondary hyperparathyroidism of renal origin: Secondary | ICD-10-CM | POA: Diagnosis not present

## 2014-07-02 DIAGNOSIS — N186 End stage renal disease: Secondary | ICD-10-CM | POA: Diagnosis not present

## 2014-07-02 DIAGNOSIS — D509 Iron deficiency anemia, unspecified: Secondary | ICD-10-CM | POA: Diagnosis not present

## 2014-07-02 DIAGNOSIS — Z23 Encounter for immunization: Secondary | ICD-10-CM | POA: Diagnosis not present

## 2014-07-04 DIAGNOSIS — N2581 Secondary hyperparathyroidism of renal origin: Secondary | ICD-10-CM | POA: Diagnosis not present

## 2014-07-04 DIAGNOSIS — D509 Iron deficiency anemia, unspecified: Secondary | ICD-10-CM | POA: Diagnosis not present

## 2014-07-04 DIAGNOSIS — Z23 Encounter for immunization: Secondary | ICD-10-CM | POA: Diagnosis not present

## 2014-07-04 DIAGNOSIS — Z992 Dependence on renal dialysis: Secondary | ICD-10-CM | POA: Diagnosis not present

## 2014-07-04 DIAGNOSIS — N186 End stage renal disease: Secondary | ICD-10-CM | POA: Diagnosis not present

## 2014-07-07 DIAGNOSIS — D509 Iron deficiency anemia, unspecified: Secondary | ICD-10-CM | POA: Diagnosis not present

## 2014-07-07 DIAGNOSIS — N186 End stage renal disease: Secondary | ICD-10-CM | POA: Diagnosis not present

## 2014-07-07 DIAGNOSIS — Z23 Encounter for immunization: Secondary | ICD-10-CM | POA: Diagnosis not present

## 2014-07-07 DIAGNOSIS — Z992 Dependence on renal dialysis: Secondary | ICD-10-CM | POA: Diagnosis not present

## 2014-07-07 DIAGNOSIS — N2581 Secondary hyperparathyroidism of renal origin: Secondary | ICD-10-CM | POA: Diagnosis not present

## 2014-07-09 DIAGNOSIS — D509 Iron deficiency anemia, unspecified: Secondary | ICD-10-CM | POA: Diagnosis not present

## 2014-07-09 DIAGNOSIS — Z992 Dependence on renal dialysis: Secondary | ICD-10-CM | POA: Diagnosis not present

## 2014-07-09 DIAGNOSIS — N2581 Secondary hyperparathyroidism of renal origin: Secondary | ICD-10-CM | POA: Diagnosis not present

## 2014-07-09 DIAGNOSIS — Z23 Encounter for immunization: Secondary | ICD-10-CM | POA: Diagnosis not present

## 2014-07-09 DIAGNOSIS — N186 End stage renal disease: Secondary | ICD-10-CM | POA: Diagnosis not present

## 2014-07-10 DIAGNOSIS — M5137 Other intervertebral disc degeneration, lumbosacral region: Secondary | ICD-10-CM | POA: Diagnosis not present

## 2014-07-10 DIAGNOSIS — G9009 Other idiopathic peripheral autonomic neuropathy: Secondary | ICD-10-CM | POA: Diagnosis not present

## 2014-07-10 DIAGNOSIS — G541 Lumbosacral plexus disorders: Secondary | ICD-10-CM | POA: Diagnosis not present

## 2014-07-10 DIAGNOSIS — R209 Unspecified disturbances of skin sensation: Secondary | ICD-10-CM | POA: Diagnosis not present

## 2014-07-10 DIAGNOSIS — M533 Sacrococcygeal disorders, not elsewhere classified: Secondary | ICD-10-CM | POA: Diagnosis not present

## 2014-07-10 DIAGNOSIS — Z79899 Other long term (current) drug therapy: Secondary | ICD-10-CM | POA: Diagnosis not present

## 2014-07-10 DIAGNOSIS — M545 Low back pain, unspecified: Secondary | ICD-10-CM | POA: Diagnosis not present

## 2014-07-10 DIAGNOSIS — S335XXA Sprain of ligaments of lumbar spine, initial encounter: Secondary | ICD-10-CM | POA: Diagnosis not present

## 2014-07-10 DIAGNOSIS — G894 Chronic pain syndrome: Secondary | ICD-10-CM | POA: Diagnosis not present

## 2014-07-11 DIAGNOSIS — Z23 Encounter for immunization: Secondary | ICD-10-CM | POA: Diagnosis not present

## 2014-07-11 DIAGNOSIS — D509 Iron deficiency anemia, unspecified: Secondary | ICD-10-CM | POA: Diagnosis not present

## 2014-07-11 DIAGNOSIS — Z992 Dependence on renal dialysis: Secondary | ICD-10-CM | POA: Diagnosis not present

## 2014-07-11 DIAGNOSIS — N186 End stage renal disease: Secondary | ICD-10-CM | POA: Diagnosis not present

## 2014-07-11 DIAGNOSIS — N2581 Secondary hyperparathyroidism of renal origin: Secondary | ICD-10-CM | POA: Diagnosis not present

## 2014-07-14 DIAGNOSIS — Z992 Dependence on renal dialysis: Secondary | ICD-10-CM | POA: Diagnosis not present

## 2014-07-14 DIAGNOSIS — N2581 Secondary hyperparathyroidism of renal origin: Secondary | ICD-10-CM | POA: Diagnosis not present

## 2014-07-14 DIAGNOSIS — N186 End stage renal disease: Secondary | ICD-10-CM | POA: Diagnosis not present

## 2014-07-14 DIAGNOSIS — Z23 Encounter for immunization: Secondary | ICD-10-CM | POA: Diagnosis not present

## 2014-07-14 DIAGNOSIS — D509 Iron deficiency anemia, unspecified: Secondary | ICD-10-CM | POA: Diagnosis not present

## 2014-07-16 DIAGNOSIS — N2581 Secondary hyperparathyroidism of renal origin: Secondary | ICD-10-CM | POA: Diagnosis not present

## 2014-07-16 DIAGNOSIS — D509 Iron deficiency anemia, unspecified: Secondary | ICD-10-CM | POA: Diagnosis not present

## 2014-07-16 DIAGNOSIS — Z992 Dependence on renal dialysis: Secondary | ICD-10-CM | POA: Diagnosis not present

## 2014-07-16 DIAGNOSIS — Z23 Encounter for immunization: Secondary | ICD-10-CM | POA: Diagnosis not present

## 2014-07-16 DIAGNOSIS — N186 End stage renal disease: Secondary | ICD-10-CM | POA: Diagnosis not present

## 2014-07-18 DIAGNOSIS — Z992 Dependence on renal dialysis: Secondary | ICD-10-CM | POA: Diagnosis not present

## 2014-07-18 DIAGNOSIS — D509 Iron deficiency anemia, unspecified: Secondary | ICD-10-CM | POA: Diagnosis not present

## 2014-07-18 DIAGNOSIS — N2581 Secondary hyperparathyroidism of renal origin: Secondary | ICD-10-CM | POA: Diagnosis not present

## 2014-07-18 DIAGNOSIS — N186 End stage renal disease: Secondary | ICD-10-CM | POA: Diagnosis not present

## 2014-07-21 DIAGNOSIS — N186 End stage renal disease: Secondary | ICD-10-CM | POA: Diagnosis not present

## 2014-07-21 DIAGNOSIS — N2581 Secondary hyperparathyroidism of renal origin: Secondary | ICD-10-CM | POA: Diagnosis not present

## 2014-07-21 DIAGNOSIS — Z992 Dependence on renal dialysis: Secondary | ICD-10-CM | POA: Diagnosis not present

## 2014-07-21 DIAGNOSIS — D509 Iron deficiency anemia, unspecified: Secondary | ICD-10-CM | POA: Diagnosis not present

## 2014-07-23 DIAGNOSIS — N186 End stage renal disease: Secondary | ICD-10-CM | POA: Diagnosis not present

## 2014-07-23 DIAGNOSIS — Z992 Dependence on renal dialysis: Secondary | ICD-10-CM | POA: Diagnosis not present

## 2014-07-23 DIAGNOSIS — D509 Iron deficiency anemia, unspecified: Secondary | ICD-10-CM | POA: Diagnosis not present

## 2014-07-23 DIAGNOSIS — N2581 Secondary hyperparathyroidism of renal origin: Secondary | ICD-10-CM | POA: Diagnosis not present

## 2014-07-26 DIAGNOSIS — Z992 Dependence on renal dialysis: Secondary | ICD-10-CM | POA: Diagnosis not present

## 2014-07-26 DIAGNOSIS — N186 End stage renal disease: Secondary | ICD-10-CM | POA: Diagnosis not present

## 2014-07-26 DIAGNOSIS — N2581 Secondary hyperparathyroidism of renal origin: Secondary | ICD-10-CM | POA: Diagnosis not present

## 2014-07-26 DIAGNOSIS — D509 Iron deficiency anemia, unspecified: Secondary | ICD-10-CM | POA: Diagnosis not present

## 2014-07-28 DIAGNOSIS — N186 End stage renal disease: Secondary | ICD-10-CM | POA: Diagnosis not present

## 2014-07-28 DIAGNOSIS — Z992 Dependence on renal dialysis: Secondary | ICD-10-CM | POA: Diagnosis not present

## 2014-07-28 DIAGNOSIS — N2581 Secondary hyperparathyroidism of renal origin: Secondary | ICD-10-CM | POA: Diagnosis not present

## 2014-07-28 DIAGNOSIS — D509 Iron deficiency anemia, unspecified: Secondary | ICD-10-CM | POA: Diagnosis not present

## 2014-07-30 DIAGNOSIS — Z992 Dependence on renal dialysis: Secondary | ICD-10-CM | POA: Diagnosis not present

## 2014-07-30 DIAGNOSIS — D509 Iron deficiency anemia, unspecified: Secondary | ICD-10-CM | POA: Diagnosis not present

## 2014-07-30 DIAGNOSIS — N2581 Secondary hyperparathyroidism of renal origin: Secondary | ICD-10-CM | POA: Diagnosis not present

## 2014-07-30 DIAGNOSIS — N186 End stage renal disease: Secondary | ICD-10-CM | POA: Diagnosis not present

## 2014-08-01 DIAGNOSIS — D509 Iron deficiency anemia, unspecified: Secondary | ICD-10-CM | POA: Diagnosis not present

## 2014-08-01 DIAGNOSIS — N2581 Secondary hyperparathyroidism of renal origin: Secondary | ICD-10-CM | POA: Diagnosis not present

## 2014-08-01 DIAGNOSIS — N186 End stage renal disease: Secondary | ICD-10-CM | POA: Diagnosis not present

## 2014-08-01 DIAGNOSIS — Z992 Dependence on renal dialysis: Secondary | ICD-10-CM | POA: Diagnosis not present

## 2014-08-04 DIAGNOSIS — Z992 Dependence on renal dialysis: Secondary | ICD-10-CM | POA: Diagnosis not present

## 2014-08-04 DIAGNOSIS — N2581 Secondary hyperparathyroidism of renal origin: Secondary | ICD-10-CM | POA: Diagnosis not present

## 2014-08-04 DIAGNOSIS — N186 End stage renal disease: Secondary | ICD-10-CM | POA: Diagnosis not present

## 2014-08-04 DIAGNOSIS — D509 Iron deficiency anemia, unspecified: Secondary | ICD-10-CM | POA: Diagnosis not present

## 2014-08-06 DIAGNOSIS — Z992 Dependence on renal dialysis: Secondary | ICD-10-CM | POA: Diagnosis not present

## 2014-08-06 DIAGNOSIS — D509 Iron deficiency anemia, unspecified: Secondary | ICD-10-CM | POA: Diagnosis not present

## 2014-08-06 DIAGNOSIS — N186 End stage renal disease: Secondary | ICD-10-CM | POA: Diagnosis not present

## 2014-08-06 DIAGNOSIS — N2581 Secondary hyperparathyroidism of renal origin: Secondary | ICD-10-CM | POA: Diagnosis not present

## 2014-08-08 DIAGNOSIS — N2581 Secondary hyperparathyroidism of renal origin: Secondary | ICD-10-CM | POA: Diagnosis not present

## 2014-08-08 DIAGNOSIS — Z992 Dependence on renal dialysis: Secondary | ICD-10-CM | POA: Diagnosis not present

## 2014-08-08 DIAGNOSIS — D509 Iron deficiency anemia, unspecified: Secondary | ICD-10-CM | POA: Diagnosis not present

## 2014-08-08 DIAGNOSIS — N186 End stage renal disease: Secondary | ICD-10-CM | POA: Diagnosis not present

## 2014-08-11 DIAGNOSIS — Z992 Dependence on renal dialysis: Secondary | ICD-10-CM | POA: Diagnosis not present

## 2014-08-11 DIAGNOSIS — D509 Iron deficiency anemia, unspecified: Secondary | ICD-10-CM | POA: Diagnosis not present

## 2014-08-11 DIAGNOSIS — N2581 Secondary hyperparathyroidism of renal origin: Secondary | ICD-10-CM | POA: Diagnosis not present

## 2014-08-11 DIAGNOSIS — N186 End stage renal disease: Secondary | ICD-10-CM | POA: Diagnosis not present

## 2014-08-12 DIAGNOSIS — M545 Low back pain: Secondary | ICD-10-CM | POA: Diagnosis not present

## 2014-08-12 DIAGNOSIS — M5430 Sciatica, unspecified side: Secondary | ICD-10-CM | POA: Diagnosis not present

## 2014-08-12 DIAGNOSIS — M5136 Other intervertebral disc degeneration, lumbar region: Secondary | ICD-10-CM | POA: Diagnosis not present

## 2014-08-12 DIAGNOSIS — M4327 Fusion of spine, lumbosacral region: Secondary | ICD-10-CM | POA: Diagnosis not present

## 2014-08-13 DIAGNOSIS — D509 Iron deficiency anemia, unspecified: Secondary | ICD-10-CM | POA: Diagnosis not present

## 2014-08-13 DIAGNOSIS — N186 End stage renal disease: Secondary | ICD-10-CM | POA: Diagnosis not present

## 2014-08-13 DIAGNOSIS — N2581 Secondary hyperparathyroidism of renal origin: Secondary | ICD-10-CM | POA: Diagnosis not present

## 2014-08-13 DIAGNOSIS — Z992 Dependence on renal dialysis: Secondary | ICD-10-CM | POA: Diagnosis not present

## 2014-08-15 DIAGNOSIS — Z992 Dependence on renal dialysis: Secondary | ICD-10-CM | POA: Diagnosis not present

## 2014-08-15 DIAGNOSIS — N2581 Secondary hyperparathyroidism of renal origin: Secondary | ICD-10-CM | POA: Diagnosis not present

## 2014-08-15 DIAGNOSIS — D509 Iron deficiency anemia, unspecified: Secondary | ICD-10-CM | POA: Diagnosis not present

## 2014-08-15 DIAGNOSIS — N186 End stage renal disease: Secondary | ICD-10-CM | POA: Diagnosis not present

## 2014-08-16 DIAGNOSIS — N186 End stage renal disease: Secondary | ICD-10-CM | POA: Diagnosis not present

## 2014-08-16 DIAGNOSIS — Z992 Dependence on renal dialysis: Secondary | ICD-10-CM | POA: Diagnosis not present

## 2014-08-18 DIAGNOSIS — D509 Iron deficiency anemia, unspecified: Secondary | ICD-10-CM | POA: Diagnosis not present

## 2014-08-18 DIAGNOSIS — R110 Nausea: Secondary | ICD-10-CM | POA: Diagnosis not present

## 2014-08-18 DIAGNOSIS — Z992 Dependence on renal dialysis: Secondary | ICD-10-CM | POA: Diagnosis not present

## 2014-08-18 DIAGNOSIS — R112 Nausea with vomiting, unspecified: Secondary | ICD-10-CM | POA: Diagnosis not present

## 2014-08-18 DIAGNOSIS — N186 End stage renal disease: Secondary | ICD-10-CM | POA: Diagnosis not present

## 2014-08-25 DIAGNOSIS — K2970 Gastritis, unspecified, without bleeding: Secondary | ICD-10-CM | POA: Diagnosis not present

## 2014-08-25 DIAGNOSIS — Z0000 Encounter for general adult medical examination without abnormal findings: Secondary | ICD-10-CM | POA: Diagnosis not present

## 2014-08-25 DIAGNOSIS — I25119 Atherosclerotic heart disease of native coronary artery with unspecified angina pectoris: Secondary | ICD-10-CM | POA: Diagnosis not present

## 2014-09-02 DIAGNOSIS — M79603 Pain in arm, unspecified: Secondary | ICD-10-CM | POA: Diagnosis not present

## 2014-09-02 DIAGNOSIS — N186 End stage renal disease: Secondary | ICD-10-CM | POA: Diagnosis not present

## 2014-09-02 DIAGNOSIS — E119 Type 2 diabetes mellitus without complications: Secondary | ICD-10-CM | POA: Diagnosis not present

## 2014-09-02 DIAGNOSIS — E108 Type 1 diabetes mellitus with unspecified complications: Secondary | ICD-10-CM | POA: Diagnosis not present

## 2014-09-02 DIAGNOSIS — I10 Essential (primary) hypertension: Secondary | ICD-10-CM | POA: Diagnosis not present

## 2014-09-02 DIAGNOSIS — I252 Old myocardial infarction: Secondary | ICD-10-CM | POA: Diagnosis not present

## 2014-09-02 DIAGNOSIS — E785 Hyperlipidemia, unspecified: Secondary | ICD-10-CM | POA: Diagnosis not present

## 2014-09-02 DIAGNOSIS — M79602 Pain in left arm: Secondary | ICD-10-CM | POA: Diagnosis not present

## 2014-09-05 ENCOUNTER — Other Ambulatory Visit: Payer: Self-pay | Admitting: *Deleted

## 2014-09-05 ENCOUNTER — Encounter: Payer: Self-pay | Admitting: *Deleted

## 2014-09-08 ENCOUNTER — Telehealth: Payer: Self-pay | Admitting: *Deleted

## 2014-09-08 DIAGNOSIS — M79645 Pain in left finger(s): Secondary | ICD-10-CM | POA: Diagnosis not present

## 2014-09-08 DIAGNOSIS — R238 Other skin changes: Secondary | ICD-10-CM | POA: Diagnosis not present

## 2014-09-08 NOTE — Telephone Encounter (Signed)
Ms. Beare called after closing on Friday 09-05-14 and said that her Left hand was discolored, cold and painful. I called [facility] this morning and spoke to [facility], she said it appeared that patient has a localized infection on 1 finger, no discoloration or cold hand. Stanton Kidney said that she is going to get the MD to see if she needs antibiotic coverage. As of now, we will keep Mrs. Grefe's schedule fistulogram on 09-16-14 with Dr. Lourena Simmonds nurse will call us back if any other problems arise.

## 2014-09-15 DIAGNOSIS — Z992 Dependence on renal dialysis: Secondary | ICD-10-CM | POA: Diagnosis not present

## 2014-09-15 DIAGNOSIS — N186 End stage renal disease: Secondary | ICD-10-CM | POA: Diagnosis not present

## 2014-09-15 MED ORDER — SODIUM CHLORIDE 0.9 % IV SOLN: 250.0000 mL | INTRAVENOUS | Status: DC | PRN

## 2014-09-15 MED ORDER — SODIUM CHLORIDE 0.9 % IJ SOLN: 3.0000 mL | INTRAMUSCULAR | Status: DC | PRN

## 2014-09-15 MED ORDER — SODIUM CHLORIDE 0.9 % IJ SOLN: 3.0000 mL | Freq: Two times a day (BID) | INTRAMUSCULAR | Status: DC

## 2014-09-16 ENCOUNTER — Ambulatory Visit (HOSPITAL_COMMUNITY)
Admission: RE | Admit: 2014-09-16 | Discharge: 2014-09-16 | Disposition: A | Discharge status: Home / Self Care | Payer: Medicare Other | Source: Ambulatory Visit | Attending: Surgery | Admitting: Surgery

## 2014-09-16 ENCOUNTER — Encounter (HOSPITAL_COMMUNITY)
Admission: RE | Disposition: A | Discharge status: Home / Self Care | Payer: Self-pay | Source: Ambulatory Visit | Attending: Surgery

## 2014-09-16 DIAGNOSIS — Z794 Long term (current) use of insulin: Secondary | ICD-10-CM | POA: Insufficient documentation

## 2014-09-16 DIAGNOSIS — N186 End stage renal disease: Secondary | ICD-10-CM | POA: Diagnosis not present

## 2014-09-16 DIAGNOSIS — F17210 Nicotine dependence, cigarettes, uncomplicated: Secondary | ICD-10-CM | POA: Insufficient documentation

## 2014-09-16 DIAGNOSIS — G35 Multiple sclerosis: Secondary | ICD-10-CM | POA: Diagnosis not present

## 2014-09-16 DIAGNOSIS — Z79891 Long term (current) use of opiate analgesic: Secondary | ICD-10-CM | POA: Diagnosis not present

## 2014-09-16 DIAGNOSIS — I739 Peripheral vascular disease, unspecified: Secondary | ICD-10-CM | POA: Insufficient documentation

## 2014-09-16 DIAGNOSIS — R200 Anesthesia of skin: Secondary | ICD-10-CM | POA: Diagnosis not present

## 2014-09-16 DIAGNOSIS — I252 Old myocardial infarction: Secondary | ICD-10-CM | POA: Diagnosis not present

## 2014-09-16 DIAGNOSIS — Z538 Procedure and treatment not carried out for other reasons: Secondary | ICD-10-CM | POA: Insufficient documentation

## 2014-09-16 DIAGNOSIS — E119 Type 2 diabetes mellitus without complications: Secondary | ICD-10-CM | POA: Diagnosis not present

## 2014-09-16 DIAGNOSIS — I120 Hypertensive chronic kidney disease with stage 5 chronic kidney disease or end stage renal disease: Secondary | ICD-10-CM | POA: Insufficient documentation

## 2014-09-16 DIAGNOSIS — I2510 Atherosclerotic heart disease of native coronary artery without angina pectoris: Secondary | ICD-10-CM | POA: Insufficient documentation

## 2014-09-16 DIAGNOSIS — L03012 Cellulitis of left finger: Secondary | ICD-10-CM | POA: Diagnosis not present

## 2014-09-16 LAB — POCT I-STAT, CHEM 8
BUN: 35 mg/dL — AB (ref 6–23)
CREATININE: 2.5 mg/dL — AB (ref 0.50–1.10)
Calcium, Ion: 1.17 mmol/L (ref 1.12–1.23)
Chloride: 94 mEq/L — ABNORMAL LOW (ref 96–112)
Glucose, Bld: 95 mg/dL (ref 70–99)
HCT: 39 % (ref 36.0–46.0)
Hemoglobin: 13.3 g/dL (ref 12.0–15.0)
POTASSIUM: 3.7 meq/L (ref 3.7–5.3)
SODIUM: 135 meq/L — AB (ref 137–147)
TCO2: 28 mmol/L (ref 0–100)

## 2014-09-16 LAB — HCG, SERUM, QUALITATIVE: PREG SERUM: NEGATIVE

## 2014-09-16 LAB — GLUCOSE, CAPILLARY: Glucose-Capillary: 92 mg/dL (ref 70–99)

## 2014-09-16 SURGERY — ASSESSMENT, SHUNT FUNCTION, WITH CONTRAST RADIOGRAPHIC STUDY
Anesthesia: LOCAL

## 2014-09-16 MED ORDER — MORPHINE SULFATE 10 MG/ML IJ SOLN: 2.0000 mg | Freq: Once | INTRAMUSCULAR | Status: DC

## 2014-09-16 MED ORDER — MORPHINE SULFATE 2 MG/ML IJ SOLN
INTRAMUSCULAR | Status: AC
  Administered 2014-09-16: 2 mg via INTRAVENOUS
  Filled 2014-09-16: qty 1

## 2014-09-16 NOTE — Progress Notes (Signed)
Notified by Dr Trula Slade that he is canceling the procedure and that he has spoken to Dr Clayton Bibles specialist, and that they are going to see her in the office this afternoon.  Called the transportation people to set up the transport.  Asked by them to fax a letter signed by the MD that patient needs to be seen today to get the transportation approved by Medicaid.  Letter signed by Dr Trula Slade and faxed.

## 2014-09-16 NOTE — Progress Notes (Signed)
VASCULAR LAB PRELIMINARY  Left upper extremity AVF doppler completed.   Left     Pressure without compression Pressure with compression  Radial 194 196  Ulnar 180 199    09/16/2014 9:35 AM Maudry Mayhew, RVT, RDCS, [place], RVS

## 2014-09-16 NOTE — H&P (Signed)
Patient name: Angela Soto MRN: [id number] DOB: Jul 29, 1971 Sex: female    No chief complaint on file.   HISTORY OF PRESENT ILLNESS: H/o L UE BVT.  Imaged multiple times and no issues found.  Pt now c/o 1 month of severe hand pain.  Past Medical History  Diagnosis Date  . Coronary artery disease   . Hypertension   . Myocardial infarction 1998  . Environmental allergies     uses inhalers  . Diabetes mellitus without complication     Type 1  . Umbilical hernia   . Headache(784.0)   . Multiple sclerosis     just diagnosed 2014  . Peripheral vascular disease     aortic artery occlusion  . Chronic kidney disease     End stage renal disease, will be starting dialysis on 10/08/13  . Complication of anesthesia   . PONV (postoperative nausea and vomiting)   . Umbilical hernia   . Nonalcoholic steatohepatitis (NASH)     Past Surgical History  Procedure Laterality Date  . Ble amputation bk    . Abdominal surgery  2006    aortic artery occluded, had to "clean it out"  . Cardiac stents    . Cardiac catheterization    . Coronary angioplasty    . Av fistula placement Left 09/24/2013    Procedure: LEFT UPPER EXTREMITY ARTERIOVENOUS (AV) FISTULA CREATION BRACHIAL/CEPHALIC;  Surgeon: Elam Dutch, MD;  Location: [place];  Service: Vascular;  Laterality: Left;  . Insertion of dialysis catheter Right 10/07/2013    Procedure: INSERTION OF DIALYSIS CATHETER;  Surgeon: Conrad Pilger, MD;  Location: [place]  Service: Vascular;  Laterality: Right;  . Irrigation and debridement abscess    . Bascilic vein transposition Left 02/18/2014    Procedure: BASILIC VEIN TRANSPOSITION - 2ND STAGE LEFT ARM;  Surgeon: Elam Dutch, MD;  Location: [place];  Service: Vascular;  Laterality: Left;    History   Social History  . Marital Status: Married    Spouse Name: N/A    Number of Children: N/A  . Years of Education: N/A   Occupational History  . Not on file.   Social History Main Topics    . Smoking status: Current Every Day Smoker -- 1.00 packs/day    Types: Cigarettes  . Smokeless tobacco: Never Used  . Alcohol Use: No  . Drug Use: No  . Sexual Activity: Not on file   Other Topics Concern  . Not on file   Social History Narrative  . No narrative on file    Family History  Problem Relation Age of Onset  . Diabetes Mellitus II Mother   . Ovarian cancer Mother   . CAD Father   . Diabetes Mellitus II Father     Allergies as of 09/05/2014 - Review Complete 04/17/2014  Allergen Reaction Noted  . Metformin and related Diarrhea and Nausea And Vomiting 07/29/2013  . Ibuprofen Other (See Comments) 04/11/2014    No current facility-administered medications on file prior to encounter.   Current Outpatient Prescriptions on File Prior to Encounter  Medication Sig Dispense Refill  . amLODipine (NORVASC) 10 MG tablet Take 10 mg by mouth daily.    . clopidogrel (PLAVIX) 75 MG tablet Take 75 mg by mouth at bedtime.     . furosemide (LASIX) 80 MG tablet Take 1 tablet (80 mg total) by mouth 2 (two) times daily. 60 tablet 0  . hydrALAZINE (APRESOLINE) 25 MG tablet Take  75 mg by mouth 2 (two) times daily.     . insulin glargine (LANTUS) 100 UNIT/ML injection Inject 52 Units into the skin at bedtime.     . insulin lispro (HUMALOG) 100 UNIT/ML injection Inject 12 Units into the skin 3 (three) times daily before meals.     . lidocaine-prilocaine (EMLA) cream Apply 1 application topically as needed (for pain/ apply prior to dialysis).    . metoprolol (TOPROL-XL) 200 MG 24 hr tablet Take 200 mg by mouth 2 (two) times daily.    Marland Kitchen oxyCODONE (OXY IR/ROXICODONE) 5 MG immediate release tablet Take 7.5 mg by mouth every 6 (six) hours as needed for severe pain.     . sevelamer carbonate (RENVELA) 800 MG tablet Take 800-1,600 mg by mouth See admin instructions. Take 1 tablet  (800 mg) with snacks and 2 tablets (1600 mg) with meals    . albuterol (PROAIR HFA) 108 (90 BASE) MCG/ACT inhaler  Inhale 2 puffs into the lungs 3 (three) times daily as needed for wheezing or shortness of breath (allergies).    . Misc. Devices (WHEELCHAIR CUSHION) MISC 1 Units by Does not apply route daily. 1 each 0      PHYSICAL EXAMINATION:   Vital signs are BP 132/55 mmHg  Pulse 57  Temp(Src) 97.6 F (36.4 C) (Oral)  Resp 18  Ht 4\' 3"  (1.295 m)  Wt 170 lb (77.111 kg)  BMI 45.98 kg/m2  SpO2 100% General: The patient appears their stated age. HEENT:  No gross abnormalities Pulmonary:  Non labored breathing Abdomen: Soft and non-tender Musculoskeletal: There are no major deformities. Neurologic: No focal weakness or paresthesias are detected, Skin: There are no ulcer or rashes noted. Psychiatric: The patient has normal affect. Cardiovascular: + thrill in AVF.  No significant change in audible signal with hand held doppler with AVF compression.   Diagnostic Studies None   Assessment: ESRD  Plan: Fistulogram and poss intervention.  Risks & benefits explained  V. Leia Alf, M.D. [facility] [place] Office: [phone] Pager:  [phone]

## 2014-09-17 DIAGNOSIS — D509 Iron deficiency anemia, unspecified: Secondary | ICD-10-CM | POA: Diagnosis not present

## 2014-09-17 DIAGNOSIS — Z418 Encounter for other procedures for purposes other than remedying health state: Secondary | ICD-10-CM | POA: Diagnosis not present

## 2014-09-17 DIAGNOSIS — R110 Nausea: Secondary | ICD-10-CM | POA: Diagnosis not present

## 2014-09-17 DIAGNOSIS — Z992 Dependence on renal dialysis: Secondary | ICD-10-CM | POA: Diagnosis not present

## 2014-09-17 DIAGNOSIS — N2581 Secondary hyperparathyroidism of renal origin: Secondary | ICD-10-CM | POA: Diagnosis not present

## 2014-09-17 DIAGNOSIS — N186 End stage renal disease: Secondary | ICD-10-CM | POA: Diagnosis not present

## 2014-09-18 DIAGNOSIS — M79603 Pain in arm, unspecified: Secondary | ICD-10-CM | POA: Diagnosis not present

## 2014-09-18 DIAGNOSIS — I252 Old myocardial infarction: Secondary | ICD-10-CM | POA: Diagnosis not present

## 2014-09-18 DIAGNOSIS — E785 Hyperlipidemia, unspecified: Secondary | ICD-10-CM | POA: Diagnosis not present

## 2014-09-18 DIAGNOSIS — M79602 Pain in left arm: Secondary | ICD-10-CM | POA: Diagnosis not present

## 2014-09-21 ENCOUNTER — Inpatient Hospital Stay (HOSPITAL_COMMUNITY)
Admission: AD | Admit: 2014-09-21 | Discharge: 2014-09-23 | DRG: 579 | Disposition: A | Discharge status: Home / Self Care | Payer: Medicare Other | Source: Other Acute Inpatient Hospital | Attending: Family Medicine | Admitting: Family Medicine

## 2014-09-21 DIAGNOSIS — Z9862 Peripheral vascular angioplasty status: Secondary | ICD-10-CM | POA: Diagnosis not present

## 2014-09-21 DIAGNOSIS — Z888 Allergy status to other drugs, medicaments and biological substances status: Secondary | ICD-10-CM | POA: Diagnosis not present

## 2014-09-21 DIAGNOSIS — Z720 Tobacco use: Secondary | ICD-10-CM | POA: Diagnosis present

## 2014-09-21 DIAGNOSIS — F17210 Nicotine dependence, cigarettes, uncomplicated: Secondary | ICD-10-CM | POA: Diagnosis present

## 2014-09-21 DIAGNOSIS — Z79899 Other long term (current) drug therapy: Secondary | ICD-10-CM

## 2014-09-21 DIAGNOSIS — I2510 Atherosclerotic heart disease of native coronary artery without angina pectoris: Secondary | ICD-10-CM | POA: Diagnosis present

## 2014-09-21 DIAGNOSIS — Z955 Presence of coronary angioplasty implant and graft: Secondary | ICD-10-CM | POA: Diagnosis not present

## 2014-09-21 DIAGNOSIS — I96 Gangrene, not elsewhere classified: Secondary | ICD-10-CM | POA: Diagnosis present

## 2014-09-21 DIAGNOSIS — M25552 Pain in left hip: Secondary | ICD-10-CM | POA: Diagnosis not present

## 2014-09-21 DIAGNOSIS — G35 Multiple sclerosis: Secondary | ICD-10-CM | POA: Diagnosis present

## 2014-09-21 DIAGNOSIS — G379 Demyelinating disease of central nervous system, unspecified: Secondary | ICD-10-CM | POA: Diagnosis present

## 2014-09-21 DIAGNOSIS — Z992 Dependence on renal dialysis: Secondary | ICD-10-CM | POA: Diagnosis not present

## 2014-09-21 DIAGNOSIS — I739 Peripheral vascular disease, unspecified: Secondary | ICD-10-CM | POA: Diagnosis present

## 2014-09-21 DIAGNOSIS — N186 End stage renal disease: Secondary | ICD-10-CM | POA: Diagnosis present

## 2014-09-21 DIAGNOSIS — IMO0001 Reserved for inherently not codable concepts without codable children: Secondary | ICD-10-CM | POA: Diagnosis present

## 2014-09-21 DIAGNOSIS — F17200 Nicotine dependence, unspecified, uncomplicated: Secondary | ICD-10-CM | POA: Diagnosis not present

## 2014-09-21 DIAGNOSIS — Z794 Long term (current) use of insulin: Secondary | ICD-10-CM | POA: Diagnosis not present

## 2014-09-21 DIAGNOSIS — Z89512 Acquired absence of left leg below knee: Secondary | ICD-10-CM

## 2014-09-21 DIAGNOSIS — L089 Local infection of the skin and subcutaneous tissue, unspecified: Principal | ICD-10-CM | POA: Diagnosis present

## 2014-09-21 DIAGNOSIS — M79642 Pain in left hand: Secondary | ICD-10-CM | POA: Diagnosis not present

## 2014-09-21 DIAGNOSIS — E1141 Type 2 diabetes mellitus with diabetic mononeuropathy: Secondary | ICD-10-CM

## 2014-09-21 DIAGNOSIS — I10 Essential (primary) hypertension: Secondary | ICD-10-CM | POA: Diagnosis not present

## 2014-09-21 DIAGNOSIS — I70209 Unspecified atherosclerosis of native arteries of extremities, unspecified extremity: Secondary | ICD-10-CM | POA: Diagnosis present

## 2014-09-21 DIAGNOSIS — M25562 Pain in left knee: Secondary | ICD-10-CM | POA: Diagnosis not present

## 2014-09-21 DIAGNOSIS — I120 Hypertensive chronic kidney disease with stage 5 chronic kidney disease or end stage renal disease: Secondary | ICD-10-CM | POA: Diagnosis present

## 2014-09-21 DIAGNOSIS — I252 Old myocardial infarction: Secondary | ICD-10-CM

## 2014-09-21 DIAGNOSIS — N189 Chronic kidney disease, unspecified: Secondary | ICD-10-CM | POA: Diagnosis not present

## 2014-09-21 DIAGNOSIS — E1149 Type 2 diabetes mellitus with other diabetic neurological complication: Secondary | ICD-10-CM | POA: Diagnosis present

## 2014-09-21 DIAGNOSIS — Z89511 Acquired absence of right leg below knee: Secondary | ICD-10-CM | POA: Diagnosis not present

## 2014-09-21 DIAGNOSIS — M25561 Pain in right knee: Secondary | ICD-10-CM | POA: Diagnosis not present

## 2014-09-21 DIAGNOSIS — M879 Osteonecrosis, unspecified: Secondary | ICD-10-CM | POA: Diagnosis not present

## 2014-09-21 DIAGNOSIS — E119 Type 2 diabetes mellitus without complications: Secondary | ICD-10-CM | POA: Diagnosis not present

## 2014-09-21 DIAGNOSIS — M545 Low back pain: Secondary | ICD-10-CM | POA: Diagnosis not present

## 2014-09-21 DIAGNOSIS — R52 Pain, unspecified: Secondary | ICD-10-CM | POA: Diagnosis not present

## 2014-09-21 DIAGNOSIS — M79645 Pain in left finger(s): Secondary | ICD-10-CM | POA: Diagnosis not present

## 2014-09-21 DIAGNOSIS — G608 Other hereditary and idiopathic neuropathies: Secondary | ICD-10-CM | POA: Diagnosis not present

## 2014-09-21 DIAGNOSIS — M4327 Fusion of spine, lumbosacral region: Secondary | ICD-10-CM | POA: Diagnosis not present

## 2014-09-21 DIAGNOSIS — L03012 Cellulitis of left finger: Secondary | ICD-10-CM | POA: Diagnosis not present

## 2014-09-21 DIAGNOSIS — G8929 Other chronic pain: Secondary | ICD-10-CM | POA: Diagnosis not present

## 2014-09-21 MED ORDER — ONDANSETRON HCL 4 MG/2ML IJ SOLN: 4.0000 mg | Freq: Three times a day (TID) | INTRAMUSCULAR | Status: DC | PRN

## 2014-09-21 MED ORDER — OXYCODONE-ACETAMINOPHEN 5-325 MG PO TABS
1.0000 | ORAL_TABLET | Freq: Four times a day (QID) | ORAL | Status: DC | PRN
  Administered 2014-09-22 (×3): 1 via ORAL
  Filled 2014-09-21 (×2): qty 1

## 2014-09-21 MED ORDER — SODIUM CHLORIDE 0.9 % IV SOLN
INTRAVENOUS | Status: DC
  Administered 2014-09-22: 01:00:00 via INTRAVENOUS

## 2014-09-21 MED ORDER — HYDROMORPHONE HCL 1 MG/ML IJ SOLN
1.0000 mg | INTRAMUSCULAR | Status: DC | PRN
  Administered 2014-09-21 – 2014-09-23 (×8): 1 mg via INTRAVENOUS
  Filled 2014-09-21 (×6): qty 1

## 2014-09-21 NOTE — H&P (Signed)
[facility] History and Physical  Angela Soto [id number] DOB: 01-22-1971 DOA: 09/21/2014  Referring physician: ED physician PCP: Doran Heater, MD  Specialists:   Chief Complaint: Left fourth finger pain.  HPI: Angela Soto is a 43 y.o. female with past medical history of diabetes mellitus, hypertension, coronary artery disease, myocardial infarction, demyelinating disease, tobacco abuse, peripheral vascular disease, s/P of bilateral BKA, and ESRD stage renal disease on dialysis (Monday/Wednesday/Friday), who presents with left fourth finger pain.  Patient reports that she initially noticed a black spot on the tip of left fourth finger 3 weeks ago. It then becomes red, swollen, and tender. Has been progressively getting worse. She had I&D by hand surgeon, Dr. Lenon Curt on 12/1 and started oral clindamycin 600 mg 3 times a day by nephrologist on 12/1. She has been taking the antibiotics, but her finger pain has been worsening. She does not have fever, chills. She has mild nausea, but no vomiting or diarrhea. Patient denies fever, chills, headaches, cough, chest pain, SOB, abdominal pain, diarrhea, constipation, dysuria, urgency, frequency, hematuria, joint pain or leg swelling. She has bilateral BKA. She was evaluated in ED of [place] consulted Dr. Lenon Curt who suggested to transfer patient to [facility] hospital, and he will see patient tomorrow morning.  Work up in the ED demonstrates no leukocytosis, no fever, creatinine 3.24. Blood pressure 149/56,  heart rate 56. Patient is admitted to inpatient for further evaluation and treatment.  Review of Systems: As presented in the history of presenting illness, rest negative.  Where does patient live?  At home Can patient participate in ADLs? little  Allergy:  Allergies  Allergen Reactions  . Metformin And Related Diarrhea and Nausea And Vomiting  . Ibuprofen Other (See Comments)    Cannot take because of low  kidney function    Past Medical History  Diagnosis Date  . Coronary artery disease   . Hypertension   . Myocardial infarction 1998  . Environmental allergies     uses inhalers  . Diabetes mellitus without complication     Type 1  . Umbilical hernia   . Headache(784.0)   . Multiple sclerosis     just diagnosed 2014  . Peripheral vascular disease     aortic artery occlusion  . Chronic kidney disease     End stage renal disease, will be starting dialysis on 10/08/13  . Complication of anesthesia   . PONV (postoperative nausea and vomiting)   . Umbilical hernia   . Nonalcoholic steatohepatitis (NASH)     Past Surgical History  Procedure Laterality Date  . Ble amputation bk    . Abdominal surgery  2006    aortic artery occluded, had to "clean it out"  . Cardiac stents    . Cardiac catheterization    . Coronary angioplasty    . Av fistula placement Left 09/24/2013    Procedure: LEFT UPPER EXTREMITY ARTERIOVENOUS (AV) FISTULA CREATION BRACHIAL/CEPHALIC;  Surgeon: Elam Dutch, MD;  Location: [place];  Service: Vascular;  Laterality: Left;  . Insertion of dialysis catheter Right 10/07/2013    Procedure: INSERTION OF DIALYSIS CATHETER;  Surgeon: Conrad Navesink, MD;  Location: [place];  Service: Vascular;  Laterality: Right;  . Irrigation and debridement abscess    . Bascilic vein transposition Left 02/18/2014    Procedure: BASILIC VEIN TRANSPOSITION - 2ND STAGE LEFT ARM;  Surgeon: Elam Dutch, MD;  Location: [place];  Service: Vascular;  Laterality: Left;  Social History:  reports that she has been smoking Cigarettes.  She has been smoking about 1.00 pack per day. She has never used smokeless tobacco. She reports that she does not drink alcohol or use illicit drugs.  Family History:  Family History  Problem Relation Age of Onset  . Diabetes Mellitus II Mother   . Ovarian cancer Mother   . CAD Father   . Diabetes Mellitus II Father      Prior to Admission medications    Medication Sig Start Date End Date Taking? Authorizing Provider  albuterol (PROAIR HFA) 108 (90 BASE) MCG/ACT inhaler Inhale 2 puffs into the lungs 3 (three) times daily as needed for wheezing or shortness of breath (allergies).    Historical Provider, MD  amLODipine (NORVASC) 10 MG tablet Take 10 mg by mouth daily.    Historical Provider, MD  clopidogrel (PLAVIX) 75 MG tablet Take 75 mg by mouth at bedtime.     Historical Provider, MD  folic acid-vitamin b complex-vitamin c-selenium-zinc (DIALYVITE) 3 MG TABS tablet Take 1 tablet by mouth daily.    Historical Provider, MD  furosemide (LASIX) 80 MG tablet Take 1 tablet (80 mg total) by mouth 2 (two) times daily. 08/03/13   Geradine Girt, DO  hydrALAZINE (APRESOLINE) 25 MG tablet Take 75 mg by mouth 2 (two) times daily.     Historical Provider, MD  insulin glargine (LANTUS) 100 UNIT/ML injection Inject 52 Units into the skin at bedtime.     Historical Provider, MD  insulin lispro (HUMALOG) 100 UNIT/ML injection Inject 12 Units into the skin 3 (three) times daily before meals.     Historical Provider, MD  lidocaine-prilocaine (EMLA) cream Apply 1 application topically as needed (for pain/ apply prior to dialysis).    Historical Provider, MD  metoprolol (TOPROL-XL) 200 MG 24 hr tablet Take 200 mg by mouth 2 (two) times daily.    Historical Provider, MD  Misc. Devices (WHEELCHAIR CUSHION) MISC 1 Units by Does not apply route daily. 08/06/13   Doran Heater, MD  Omega-3 Fatty Acids (FISH OIL) 1000 MG CAPS Take 1,000 mg by mouth 2 (two) times daily.    Historical Provider, MD  oxyCODONE (OXY IR/ROXICODONE) 5 MG immediate release tablet Take 7.5 mg by mouth every 6 (six) hours as needed for severe pain.     Historical Provider, MD  sevelamer carbonate (RENVELA) 800 MG tablet Take 800-1,600 mg by mouth See admin instructions. Take 1 tablet  (800 mg) with snacks and 2 tablets (1600 mg) with meals    Historical Provider, MD    Physical Exam: Filed  Vitals:   09/21/14 2226  BP: 141/55  Pulse: 55  Temp: 98.6 F (37 C)  TempSrc: Oral  Resp: 18  Height: 4\' 3"  (1.295 m)  Weight: 201.3 kg (443 lb 12.6 oz)  SpO2: 97%   General: Not in acute distress HEENT:       Eyes: PERRL, EOMI, no scleral icterus       ENT: No discharge from the ears and nose, no pharynx injection, no tonsillar enlargement.        Neck: No JVD, no bruit, no mass felt. Cardiac: S1/S2, RRR, No murmurs, No gallops or rubs Pulm: Good air movement bilaterally. Clear to auscultation bilaterally. No rales, wheezing, rhonchi or rubs. Abd: Soft, nondistended, nontender, no rebound pain, no organomegaly, BS present Ext: bilateral BKA, fistula in Left arm with good bruit,  left 4th finger is bluish over the tip and PIP joint,  extremely tender, surrounding erythematous. The tip of finger has fluctuation Musculoskeletal: No joint deformities, erythema, or stiffness, ROM full Skin: No rashes.  Neuro: Alert and oriented X3, cranial nerves II-XII grossly intact, muscle strength 5/5 in all extremeties, sensation to light touch intact. Brachial reflex 2+ bilaterally. Psych: Patient is not psychotic, no suicidal or hemocidal ideation.  Labs on Admission:  Basic Metabolic Panel:  Recent Labs Lab 09/16/14 0840  NA 135*  K 3.7  CL 94*  GLUCOSE 95  BUN 35*  CREATININE 2.50*   Liver Function Tests: No results for input(s): AST, ALT, ALKPHOS, BILITOT, PROT, ALBUMIN in the last 168 hours. No results for input(s): LIPASE, AMYLASE in the last 168 hours. No results for input(s): AMMONIA in the last 168 hours. CBC:  Recent Labs Lab 09/16/14 0840  HGB 13.3  HCT 39.0   Cardiac Enzymes: No results for input(s): CKTOTAL, CKMB, CKMBINDEX, TROPONINI in the last 168 hours.  BNP (last 3 results) No results for input(s): PROBNP in the last 8760 hours. CBG:  Recent Labs Lab 09/16/14 0731  GLUCAP 92    Radiological Exams on Admission: No results found.  EKG: Independently  reviewed.   Assessment/Plan Principal Problem:   Finger infection Active Problems:   Type II diabetes mellitus with neurological manifestations not at goal   HTN (hypertension)   CAD (coronary artery disease)   Tobacco abuse   Atherosclerotic peripheral vascular disease   Demyelinating disorder   End stage renal disease on dialysis  Finger infection of left fourth finger: s/p I&D, failed oral clindamycin. Now looks like gangrenous change. Patient is hemodynamically stable, not obviously septic, no fever or leukocytosis, but has mild nausea. Hand surgeon was consulted by ED, Dr. Lenon Curt will see in AM -Admit to MedSurg bed -Pain control: Percocet plus IV Dilaudid when necessary since patient's pain is extremely severe -IV vancomycin plus Zosyn per pharmacy -Gentle IV fluid: 50 mL per hour normal saline given the stage renal disease -Hold the Lasix given that the patient is at risk of sepsis -Blood cultures 2 - check lactic acid level - inr - MRI without contrast of left hand to r/o osteo  Diabetes mellitus: Last A1c 5.9 on 07/30/13.. On Lantus 52 units daily at home -Decrease Lantus to 40 units while on NPO -SSI -check A1c  Coronary artery disease and  history of myocardial infarction: Currently patient does not have any chest pain. -Get EKG -Continue metoprolol  Hypertension: -Continue amlodipine, hydralazine and Toprol  Tobacco abuse: Used to smoke 2 pack a day, currently half pack a day, potentially for 20 years -Nicotine patch -I did counseling about the importance of quitting smoking  End stage renal disease on dialysis: Schedule: Monday/Wednesday/Friday. Patient did not miss any course of dialysis recently. Currently patient is euvolemic. Creatinine is 3.24, BUN 47, potassium 4.3. -Left message to renal for dialysis tomorrow -Continue renal vitamines  Peripheral vascular disease:s/p bilateral BKA -Continue home Plavix   DVT ppx: SQ Heparin    Code Status: Full  code Family Communication: None at bed side.  Disposition Plan: Admit to inpatient   Date of Service 09/21/2014    Ivor Costa [facility] Pager [phone]  If 7PM-7AM, please contact night-coverage [email or web address] Password [email or web address] 09/21/2014, 11:43 PM

## 2014-09-22 ENCOUNTER — Encounter (HOSPITAL_COMMUNITY)
Admission: AD | Disposition: A | Discharge status: Home / Self Care | Payer: Self-pay | Source: Other Acute Inpatient Hospital | Attending: Family Medicine

## 2014-09-22 ENCOUNTER — Inpatient Hospital Stay (HOSPITAL_COMMUNITY): Payer: Medicare Other | Admitting: Certified Registered"

## 2014-09-22 ENCOUNTER — Encounter (HOSPITAL_COMMUNITY): Payer: Self-pay | Admitting: Certified Registered"

## 2014-09-22 DIAGNOSIS — L03012 Cellulitis of left finger: Secondary | ICD-10-CM | POA: Diagnosis not present

## 2014-09-22 HISTORY — PX: AMPUTATION: SHX166

## 2014-09-22 LAB — HEPATITIS B SURFACE ANTIGEN: Hepatitis B Surface Ag: NEGATIVE

## 2014-09-22 LAB — CBC
HCT: 33.9 % — ABNORMAL LOW (ref 36.0–46.0)
HEMATOCRIT: 35.9 % — AB (ref 36.0–46.0)
HEMOGLOBIN: 12 g/dL (ref 12.0–15.0)
HEMOGLOBIN: 11.4 g/dL — AB (ref 12.0–15.0)
MCH: 28.7 pg (ref 26.0–34.0)
MCH: 28.6 pg (ref 26.0–34.0)
MCHC: 33.4 g/dL (ref 30.0–36.0)
MCHC: 33.6 g/dL (ref 30.0–36.0)
MCV: 85.9 fL (ref 78.0–100.0)
MCV: 85 fL (ref 78.0–100.0)
Platelets: 197 10*3/uL (ref 150–400)
Platelets: 174 10*3/uL (ref 150–400)
RBC: 4.18 MIL/uL (ref 3.87–5.11)
RBC: 3.99 MIL/uL (ref 3.87–5.11)
RDW: 13.6 % (ref 11.5–15.5)
RDW: 13.6 % (ref 11.5–15.5)
WBC: 7.6 10*3/uL (ref 4.0–10.5)
WBC: 7.1 10*3/uL (ref 4.0–10.5)

## 2014-09-22 LAB — RENAL FUNCTION PANEL
ANION GAP: 16 — AB (ref 5–15)
Albumin: 2.6 g/dL — ABNORMAL LOW (ref 3.5–5.2)
BUN: 55 mg/dL — AB (ref 6–23)
CO2: 24 meq/L (ref 19–32)
CREATININE: 3.57 mg/dL — AB (ref 0.50–1.10)
Calcium: 8.5 mg/dL (ref 8.4–10.5)
Chloride: 93 mEq/L — ABNORMAL LOW (ref 96–112)
GFR calc Af Amer: 17 mL/min — ABNORMAL LOW (ref >90)
GFR calc non Af Amer: 15 mL/min — ABNORMAL LOW (ref >90)
GLUCOSE: 120 mg/dL — AB (ref 70–99)
POTASSIUM: 4.8 meq/L (ref 3.7–5.3)
Phosphorus: 6.3 mg/dL — ABNORMAL HIGH (ref 2.3–4.6)
Sodium: 133 mEq/L — ABNORMAL LOW (ref 137–147)

## 2014-09-22 LAB — HCG, QUANTITATIVE, PREGNANCY: hCG, Beta Chain, Quant, S: <1 m[IU]/mL (ref <5)

## 2014-09-22 LAB — COMPREHENSIVE METABOLIC PANEL
ALBUMIN: 3 g/dL — AB (ref 3.5–5.2)
ALK PHOS: 111 U/L (ref 39–117)
ALT: 14 U/L (ref 0–35)
ANION GAP: 16 — AB (ref 5–15)
AST: 9 U/L (ref 0–37)
BUN: 48 mg/dL — AB (ref 6–23)
CHLORIDE: 92 meq/L — AB (ref 96–112)
CO2: 27 mEq/L (ref 19–32)
Calcium: 8.9 mg/dL (ref 8.4–10.5)
Creatinine, Ser: 3.21 mg/dL — ABNORMAL HIGH (ref 0.50–1.10)
GFR calc non Af Amer: 17 mL/min — ABNORMAL LOW (ref >90)
GFR, EST AFRICAN AMERICAN: 19 mL/min — AB (ref >90)
GLUCOSE: 144 mg/dL — AB (ref 70–99)
Potassium: 4.4 mEq/L (ref 3.7–5.3)
Sodium: 135 mEq/L — ABNORMAL LOW (ref 137–147)
TOTAL PROTEIN: 6.7 g/dL (ref 6.0–8.3)
Total Bilirubin: 0.3 mg/dL (ref 0.3–1.2)

## 2014-09-22 LAB — HEMOGLOBIN A1C
HEMOGLOBIN A1C: 6 % — AB (ref <5.7)
Mean Plasma Glucose: 126 mg/dL — ABNORMAL HIGH (ref <117)

## 2014-09-22 LAB — GLUCOSE, CAPILLARY
Glucose-Capillary: 108 mg/dL — ABNORMAL HIGH (ref 70–99)
Glucose-Capillary: 123 mg/dL — ABNORMAL HIGH (ref 70–99)
Glucose-Capillary: 131 mg/dL — ABNORMAL HIGH (ref 70–99)
Glucose-Capillary: 74 mg/dL (ref 70–99)
Glucose-Capillary: 80 mg/dL (ref 70–99)
Glucose-Capillary: 85 mg/dL (ref 70–99)

## 2014-09-22 LAB — LACTIC ACID, PLASMA: Lactic Acid, Venous: 1.5 mmol/L (ref 0.5–2.2)

## 2014-09-22 LAB — SURGICAL PCR SCREEN
MRSA, PCR: NEGATIVE
STAPHYLOCOCCUS AUREUS: POSITIVE — AB

## 2014-09-22 LAB — PROTIME-INR
INR: 1.07 (ref 0.00–1.49)
Prothrombin Time: 14 seconds (ref 11.6–15.2)

## 2014-09-22 LAB — APTT: aPTT: 37 seconds (ref 24–37)

## 2014-09-22 SURGERY — AMPUTATION DIGIT
Anesthesia: General | Laterality: Left

## 2014-09-22 MED ORDER — LIDOCAINE HCL (CARDIAC) 20 MG/ML IV SOLN
INTRAVENOUS | Status: DC | PRN
  Administered 2014-09-22: 10 mg via INTRAVENOUS

## 2014-09-22 MED ORDER — PENTAFLUOROPROP-TETRAFLUOROETH EX AERO: 1.0000 "application " | INHALATION_SPRAY | CUTANEOUS | Status: DC | PRN

## 2014-09-22 MED ORDER — OXYCODONE HCL 5 MG PO TABS: 5.0000 mg | ORAL_TABLET | Freq: Once | ORAL | Status: DC | PRN

## 2014-09-22 MED ORDER — PROPOFOL 10 MG/ML IV BOLUS
INTRAVENOUS | Status: DC | PRN
  Administered 2014-09-22: 50 mg via INTRAVENOUS
  Administered 2014-09-22: 150 mg via INTRAVENOUS

## 2014-09-22 MED ORDER — METOPROLOL SUCCINATE ER 100 MG PO TB24
200.0000 mg | ORAL_TABLET | Freq: Two times a day (BID) | ORAL | Status: DC
  Administered 2014-09-22 – 2014-09-23 (×4): 200 mg via ORAL
  Filled 2014-09-22 (×5): qty 2

## 2014-09-22 MED ORDER — LIDOCAINE-PRILOCAINE 2.5-2.5 % EX CREA
1.0000 "application " | TOPICAL_CREAM | CUTANEOUS | Status: DC | PRN
  Filled 2014-09-22: qty 5

## 2014-09-22 MED ORDER — BUPIVACAINE HCL (PF) 0.25 % IJ SOLN
INTRAMUSCULAR | Status: AC
Start: 2014-09-22 — End: 2014-09-22
  Filled 2014-09-22: qty 10

## 2014-09-22 MED ORDER — PIPERACILLIN-TAZOBACTAM IN DEX 2-0.25 GM/50ML IV SOLN
2.2500 g | Freq: Once | INTRAVENOUS | Status: AC
  Administered 2014-09-22: 2.25 g via INTRAVENOUS
  Filled 2014-09-22: qty 50

## 2014-09-22 MED ORDER — SODIUM CHLORIDE 0.9 % IR SOLN
Status: DC | PRN
  Administered 2014-09-22: 1000 mL

## 2014-09-22 MED ORDER — ONDANSETRON HCL 4 MG/2ML IJ SOLN
INTRAMUSCULAR | Status: DC | PRN
  Administered 2014-09-22: 4 mg via INTRAVENOUS

## 2014-09-22 MED ORDER — AMLODIPINE BESYLATE 10 MG PO TABS
10.0000 mg | ORAL_TABLET | Freq: Every day | ORAL | Status: DC
  Administered 2014-09-23: 10 mg via ORAL
  Filled 2014-09-22 (×2): qty 1

## 2014-09-22 MED ORDER — OMEGA-3-ACID ETHYL ESTERS 1 G PO CAPS
1.0000 g | ORAL_CAPSULE | Freq: Every day | ORAL | Status: DC
  Administered 2014-09-22 – 2014-09-23 (×2): 1 g via ORAL
  Filled 2014-09-22 (×2): qty 1

## 2014-09-22 MED ORDER — MEPERIDINE HCL 25 MG/ML IJ SOLN: 6.2500 mg | INTRAMUSCULAR | Status: DC | PRN

## 2014-09-22 MED ORDER — LIDOCAINE HCL (PF) 1 % IJ SOLN: 5.0000 mL | INTRAMUSCULAR | Status: DC | PRN

## 2014-09-22 MED ORDER — HYDROMORPHONE HCL 1 MG/ML IJ SOLN
INTRAMUSCULAR | Status: AC
  Filled 2014-09-22: qty 1

## 2014-09-22 MED ORDER — SODIUM CHLORIDE 0.9 % IV SOLN: 100.0000 mL | INTRAVENOUS | Status: DC | PRN

## 2014-09-22 MED ORDER — VANCOMYCIN HCL IN DEXTROSE 750-5 MG/150ML-% IV SOLN
750.0000 mg | Freq: Once | INTRAVENOUS | Status: AC
  Administered 2014-09-22: 750 mg via INTRAVENOUS
  Filled 2014-09-22: qty 150

## 2014-09-22 MED ORDER — INSULIN GLARGINE 100 UNIT/ML ~~LOC~~ SOLN
40.0000 [IU] | Freq: Every day | SUBCUTANEOUS | Status: DC
  Administered 2014-09-22 (×2): 40 [IU] via SUBCUTANEOUS
  Filled 2014-09-22 (×3): qty 0.4

## 2014-09-22 MED ORDER — LIDOCAINE-PRILOCAINE 2.5-2.5 % EX CREA: 1.0000 "application " | TOPICAL_CREAM | CUTANEOUS | Status: DC | PRN

## 2014-09-22 MED ORDER — ALTEPLASE 2 MG IJ SOLR
2.0000 mg | Freq: Once | INTRAMUSCULAR | Status: DC | PRN
  Filled 2014-09-22: qty 2

## 2014-09-22 MED ORDER — NA FERRIC GLUC CPLX IN SUCROSE 12.5 MG/ML IV SOLN
125.0000 mg | INTRAVENOUS | Status: DC
  Administered 2014-09-22: 125 mg via INTRAVENOUS
  Filled 2014-09-22 (×2): qty 10

## 2014-09-22 MED ORDER — HEPARIN SODIUM (PORCINE) 5000 UNIT/ML IJ SOLN
5000.0000 [IU] | Freq: Three times a day (TID) | INTRAMUSCULAR | Status: DC
  Administered 2014-09-22 – 2014-09-23 (×3): 5000 [IU] via SUBCUTANEOUS
  Filled 2014-09-22 (×7): qty 1

## 2014-09-22 MED ORDER — DEXTROSE 50 % IV SOLN
1.0000 | Freq: Once | INTRAVENOUS | Status: AC
  Administered 2014-09-22: 12.5 g via INTRAVENOUS

## 2014-09-22 MED ORDER — OXYCODONE-ACETAMINOPHEN 5-325 MG PO TABS
1.0000 | ORAL_TABLET | ORAL | Status: DC | PRN
  Administered 2014-09-22 – 2014-09-23 (×3): 1 via ORAL
  Filled 2014-09-22 (×3): qty 1

## 2014-09-22 MED ORDER — INSULIN ASPART 100 UNIT/ML ~~LOC~~ SOLN
0.0000 [IU] | Freq: Three times a day (TID) | SUBCUTANEOUS | Status: DC
  Administered 2014-09-22: 1 [IU] via SUBCUTANEOUS

## 2014-09-22 MED ORDER — SEVELAMER CARBONATE 800 MG PO TABS: 800.0000 mg | ORAL_TABLET | ORAL | Status: DC | PRN

## 2014-09-22 MED ORDER — VANCOMYCIN HCL IN DEXTROSE 1-5 GM/200ML-% IV SOLN
1000.0000 mg | INTRAVENOUS | Status: DC
  Administered 2014-09-22: 1000 mg via INTRAVENOUS
  Filled 2014-09-22 (×4): qty 200

## 2014-09-22 MED ORDER — CLOPIDOGREL BISULFATE 75 MG PO TABS
75.0000 mg | ORAL_TABLET | Freq: Every day | ORAL | Status: DC
  Administered 2014-09-22 (×2): 75 mg via ORAL
  Filled 2014-09-22 (×3): qty 1

## 2014-09-22 MED ORDER — DIPHENHYDRAMINE HCL 50 MG/ML IJ SOLN
INTRAMUSCULAR | Status: DC | PRN
  Administered 2014-09-22: 10 mg via INTRAVENOUS

## 2014-09-22 MED ORDER — LIDOCAINE HCL (CARDIAC) 20 MG/ML IV SOLN
INTRAVENOUS | Status: AC
  Filled 2014-09-22: qty 5

## 2014-09-22 MED ORDER — PIPERACILLIN-TAZOBACTAM IN DEX 2-0.25 GM/50ML IV SOLN
2.2500 g | Freq: Three times a day (TID) | INTRAVENOUS | Status: DC
  Administered 2014-09-22 – 2014-09-23 (×4): 2.25 g via INTRAVENOUS
  Filled 2014-09-22 (×5): qty 50

## 2014-09-22 MED ORDER — FENTANYL CITRATE 0.05 MG/ML IJ SOLN
INTRAMUSCULAR | Status: AC
  Administered 2014-09-22: 100 ug
  Filled 2014-09-22: qty 2

## 2014-09-22 MED ORDER — DIALYVITE 3000 3 MG PO TABS
1.0000 | ORAL_TABLET | Freq: Every day | ORAL | Status: DC
  Filled 2014-09-22: qty 1

## 2014-09-22 MED ORDER — OXYCODONE HCL 5 MG/5ML PO SOLN: 5.0000 mg | Freq: Once | ORAL | Status: DC | PRN

## 2014-09-22 MED ORDER — SODIUM CHLORIDE 0.9 % IV SOLN
INTRAVENOUS | Status: DC
  Administered 2014-09-22: 12:00:00 via INTRAVENOUS

## 2014-09-22 MED ORDER — NICOTINE 21 MG/24HR TD PT24
21.0000 mg | MEDICATED_PATCH | Freq: Every day | TRANSDERMAL | Status: DC
  Filled 2014-09-22 (×2): qty 1

## 2014-09-22 MED ORDER — HEPARIN SODIUM (PORCINE) 1000 UNIT/ML DIALYSIS: 1000.0000 [IU] | INTRAMUSCULAR | Status: DC | PRN

## 2014-09-22 MED ORDER — RENA-VITE PO TABS
1.0000 | ORAL_TABLET | Freq: Every day | ORAL | Status: DC
  Administered 2014-09-23: 1 via ORAL
  Filled 2014-09-22 (×2): qty 1

## 2014-09-22 MED ORDER — ONDANSETRON HCL 4 MG/2ML IJ SOLN
INTRAMUSCULAR | Status: AC
  Filled 2014-09-22: qty 2

## 2014-09-22 MED ORDER — DIPHENHYDRAMINE HCL 50 MG/ML IJ SOLN
INTRAMUSCULAR | Status: AC
  Filled 2014-09-22: qty 1

## 2014-09-22 MED ORDER — HYDROMORPHONE HCL 1 MG/ML IJ SOLN: 0.2500 mg | INTRAMUSCULAR | Status: DC | PRN

## 2014-09-22 MED ORDER — MIDAZOLAM HCL 2 MG/2ML IJ SOLN: 0.5000 mg | Freq: Once | INTRAMUSCULAR | Status: DC | PRN

## 2014-09-22 MED ORDER — FENTANYL CITRATE 0.05 MG/ML IJ SOLN
INTRAMUSCULAR | Status: AC
  Filled 2014-09-22: qty 5

## 2014-09-22 MED ORDER — PROPOFOL 10 MG/ML IV BOLUS
INTRAVENOUS | Status: AC
  Filled 2014-09-22: qty 20

## 2014-09-22 MED ORDER — BUPIVACAINE HCL (PF) 0.25 % IJ SOLN
INTRAMUSCULAR | Status: DC | PRN
  Administered 2014-09-22: 10 mL

## 2014-09-22 MED ORDER — HYDRALAZINE HCL 50 MG PO TABS
75.0000 mg | ORAL_TABLET | Freq: Two times a day (BID) | ORAL | Status: DC
  Administered 2014-09-22 – 2014-09-23 (×3): 75 mg via ORAL
  Filled 2014-09-22 (×5): qty 1

## 2014-09-22 MED ORDER — MIDAZOLAM HCL 2 MG/2ML IJ SOLN
INTRAMUSCULAR | Status: AC
  Filled 2014-09-22: qty 2

## 2014-09-22 MED ORDER — PROMETHAZINE HCL 25 MG/ML IJ SOLN: 6.2500 mg | INTRAMUSCULAR | Status: DC | PRN

## 2014-09-22 MED ORDER — SEVELAMER CARBONATE 800 MG PO TABS
1600.0000 mg | ORAL_TABLET | Freq: Three times a day (TID) | ORAL | Status: DC
  Administered 2014-09-22 – 2014-09-23 (×3): 1600 mg via ORAL
  Filled 2014-09-22 (×7): qty 2

## 2014-09-22 MED ORDER — OXYCODONE-ACETAMINOPHEN 5-325 MG PO TABS
ORAL_TABLET | ORAL | Status: AC
  Filled 2014-09-22: qty 1

## 2014-09-22 MED ORDER — ALBUTEROL SULFATE (2.5 MG/3ML) 0.083% IN NEBU: 3.0000 mL | INHALATION_SOLUTION | Freq: Three times a day (TID) | RESPIRATORY_TRACT | Status: DC | PRN

## 2014-09-22 MED ORDER — NEPRO/CARBSTEADY PO LIQD
237.0000 mL | ORAL | Status: DC | PRN
  Filled 2014-09-22: qty 237

## 2014-09-22 SURGICAL SUPPLY — 43 items
BAG DECANTER FOR FLEXI CONT (MISCELLANEOUS) ×3 IMPLANT
BANDAGE ELASTIC 3 VELCRO ST LF (GAUZE/BANDAGES/DRESSINGS) ×3 IMPLANT
BANDAGE ELASTIC 4 VELCRO ST LF (GAUZE/BANDAGES/DRESSINGS) IMPLANT
BNDG GAUZE ELAST 4 BULKY (GAUZE/BANDAGES/DRESSINGS) IMPLANT
CATH URET WHISTLE 8FR 331008 (CATHETERS) ×3 IMPLANT
CORDS BIPOLAR (ELECTRODE) IMPLANT
CUFF TOURNIQUET SINGLE 18IN (TOURNIQUET CUFF) IMPLANT
DRAPE SURG 17X23 STRL (DRAPES) ×3 IMPLANT
ELECT REM PT RETURN 9FT ADLT (ELECTROSURGICAL)
ELECTRODE REM PT RTRN 9FT ADLT (ELECTROSURGICAL) IMPLANT
GAUZE PACKING IODOFORM 1/4X5 (PACKING) IMPLANT
GAUZE SPONGE 4X4 12PLY STRL (GAUZE/BANDAGES/DRESSINGS) ×3 IMPLANT
GAUZE XEROFORM 1X8 LF (GAUZE/BANDAGES/DRESSINGS) ×3 IMPLANT
GLOVE BIO SURGEON STRL SZ 6.5 (GLOVE) ×4 IMPLANT
GLOVE BIO SURGEONS STRL SZ 6.5 (GLOVE) ×2
GLOVE BIOGEL M STRL SZ7.5 (GLOVE) ×6 IMPLANT
GOWN STRL REUS W/ TWL LRG LVL3 (GOWN DISPOSABLE) ×3 IMPLANT
GOWN STRL REUS W/TWL LRG LVL3 (GOWN DISPOSABLE) ×6
HANDPIECE INTERPULSE COAX TIP (DISPOSABLE)
KIT BASIN OR (CUSTOM PROCEDURE TRAY) ×3 IMPLANT
KIT ROOM TURNOVER OR (KITS) ×3 IMPLANT
MANIFOLD NEPTUNE II (INSTRUMENTS) ×3 IMPLANT
NEEDLE HYPO 20X1 EYE RM 11 (NEEDLE) ×3 IMPLANT
NEEDLE HYPO 25GX1X1/2 BEV (NEEDLE) ×3 IMPLANT
NS IRRIG 1000ML POUR BTL (IV SOLUTION) ×3 IMPLANT
PACK ORTHO EXTREMITY (CUSTOM PROCEDURE TRAY) ×3 IMPLANT
PAD ARMBOARD 7.5X6 YLW CONV (MISCELLANEOUS) ×6 IMPLANT
PAD CAST 4YDX4 CTTN HI CHSV (CAST SUPPLIES) IMPLANT
PADDING CAST COTTON 4X4 STRL (CAST SUPPLIES)
SET HNDPC FAN SPRY TIP SCT (DISPOSABLE) IMPLANT
SOAP 2 % CHG 4 OZ (WOUND CARE) ×3 IMPLANT
SPONGE LAP 18X18 X RAY DECT (DISPOSABLE) IMPLANT
SPONGE LAP 4X18 X RAY DECT (DISPOSABLE) ×3 IMPLANT
SUT PROLENE 4 0 P 3 18 (SUTURE) ×3 IMPLANT
SUT PROLENE 4 0 PS 2 18 (SUTURE) ×3 IMPLANT
SYR CONTROL 10ML LL (SYRINGE) ×3 IMPLANT
TOWEL OR 17X24 6PK STRL BLUE (TOWEL DISPOSABLE) ×3 IMPLANT
TOWEL OR 17X26 10 PK STRL BLUE (TOWEL DISPOSABLE) ×3 IMPLANT
TUBE ANAEROBIC SPECIMEN COL (MISCELLANEOUS) IMPLANT
TUBE CONNECTING 12'X1/4 (SUCTIONS) ×2
TUBE CONNECTING 12X1/4 (SUCTIONS) ×4 IMPLANT
WATER STERILE IRR 1000ML POUR (IV SOLUTION) ×3 IMPLANT
YANKAUER SUCT BULB TIP NO VENT (SUCTIONS) ×3 IMPLANT

## 2014-09-22 NOTE — Progress Notes (Signed)
[facility] PROGRESS NOTE  Angela Soto [id number] DOB: 04/12/1971 DOA: 09/21/2014 PCP: Doran Heater, MD  Assessment/Plan: Principal Problem:   Finger infection - Ortho on board and assisting with management - Pt currently on vancomycin and Zosyn  Active Problems:   Type II diabetes mellitus with neurological manifestations not at goal -Carb modified diet - We'll continue sliding scale insulin, Lantus    HTN (hypertension) -Patient currently on amlodipine, hydralazine    CAD (coronary artery disease) -We'll continue Lovaza, stable no chest pain reported    Tobacco abuse - We'll continue recommend cessation - Continue nicotine patch while in house    Atherosclerotic peripheral vascular disease -Patient on Plavix and statin    Demyelinating disorder - Patient to follow-up with physician managing this disorder after discharging from hospital. For now continue current regimen.    End stage renal disease on dialysis -Stable, continue current regimen  Code Status: Full code Family Communication: Discussed with spouse bedside Disposition Plan: Pending recommendations from orthopedic surgery   Consultants:  Orthopedic surgery  Procedures:  Please refer to orthopedic surgeons post procedural notes  Antibiotics:  Vancomycin and Zosyn  HPI/Subjective: The patient has no new complaints. Is interested in being discharged tomorrow morning.  Objective: Filed Vitals:   09/22/14 1534  BP: 146/58  Pulse: 56  Temp: 98.5 F (36.9 C)  Resp: 14    Intake/Output Summary (Last 24 hours) at 09/22/14 1725 Last data filed at 09/22/14 1337  Gross per 24 hour  Intake    100 ml  Output      0 ml  Net    100 ml   Filed Weights   09/21/14 2226  Weight: 90.719 kg (200 lb)    Exam:   General:  Patient in no acute distress, alert and awake  Cardiovascular: Warm and pink extremities  Respiratory: No increased work of breathing, equal chest rise, no  audible wheezes  Abdomen: Soft nondistended, nontender, no guarding  Musculoskeletal: Normal upper extremity muscle tone   Data Reviewed: Basic Metabolic Panel:  Recent Labs Lab 09/16/14 0840 09/22/14 0125  NA 135* 135*  K 3.7 4.4  CL 94* 92*  CO2  --  27  GLUCOSE 95 144*  BUN 35* 48*  CREATININE 2.50* 3.21*  CALCIUM  --  8.9   Liver Function Tests:  Recent Labs Lab 09/22/14 0125  AST 9  ALT 14  ALKPHOS 111  BILITOT 0.3  PROT 6.7  ALBUMIN 3.0*   No results for input(s): LIPASE, AMYLASE in the last 168 hours. No results for input(s): AMMONIA in the last 168 hours. CBC:  Recent Labs Lab 09/16/14 0840 09/22/14 0125  WBC  --  7.6  HGB 13.3 12.0  HCT 39.0 35.9*  MCV  --  85.9  PLT  --  197   Cardiac Enzymes: No results for input(s): CKTOTAL, CKMB, CKMBINDEX, TROPONINI in the last 168 hours. BNP (last 3 results) No results for input(s): PROBNP in the last 8760 hours. CBG:  Recent Labs Lab 09/21/14 2351 09/22/14 0626 09/22/14 1249 09/22/14 1400 09/22/14 1648  GLUCAP 108* 80 74 85 123*    Recent Results (from the past 240 hour(s))  Surgical pcr screen     Status: Abnormal   Collection Time: 09/22/14  9:36 AM  Result Value Ref Range Status   MRSA, PCR NEGATIVE NEGATIVE Final   Staphylococcus aureus POSITIVE (A) NEGATIVE Final    Comment:        The Xpert SA Assay (FDA  approved for NASAL specimens in patients over 10 years of age), is one component of a comprehensive surveillance program.  Test performance has been validated by [employer] for patients greater than or equal to 64 year old. It is not intended to diagnose infection nor to guide or monitor treatment.      Studies: No results found.  Scheduled Meds: . amLODipine  10 mg Oral Daily  . clopidogrel  75 mg Oral QHS  . ferric gluconate (FERRLECIT/NULECIT) IV  125 mg Intravenous Weekly  . heparin  5,000 Units Subcutaneous 3 times per day  . hydrALAZINE  75 mg Oral BID  .  insulin aspart  0-9 Units Subcutaneous TID WC  . insulin glargine  40 Units Subcutaneous QHS  . metoprolol  200 mg Oral BID  . multivitamin  1 tablet Oral Daily  . nicotine  21 mg Transdermal Daily  . omega-3 acid ethyl esters  1 g Oral Daily  . piperacillin-tazobactam (ZOSYN)  IV  2.25 g Intravenous Q8H  . sevelamer carbonate  1,600 mg Oral TID WC  . vancomycin  1,000 mg Intravenous Q M,W,F-HD   Continuous Infusions: . sodium chloride 50 mL/hr at 09/22/14 0103  . sodium chloride 10 mL/hr at 09/22/14 1227    Time spent: > 35 minutes    Velvet Bathe  [facility] Pager [phone] If 7PM-7AM, please contact night-coverage at [email or web address], password [email or web address] 09/22/2014, 5:25 PM  LOS: 1 day

## 2014-09-22 NOTE — Progress Notes (Signed)
ANTIBIOTIC CONSULT NOTE - INITIAL  Pharmacy Consult for Vancocin and Zosyn Indication: gangrenous finger  Allergies  Allergen Reactions  . Metformin And Related Diarrhea and Nausea And Vomiting  . Ibuprofen Other (See Comments)    Cannot take because of low kidney function    Patient Measurements: Height: 4\' 3"  (129.5 cm) Weight: 200 lb (90.719 kg) IBW/kg (Calculated) : 24.8  Vital Signs: Temp: 98.6 F (37 C) (12/06 2226) Temp Source: Oral (12/06 2226) BP: 141/55 mmHg (12/06 2226) Pulse Rate: 55 (12/06 2226)   Medical History: Past Medical History  Diagnosis Date  . Coronary artery disease   . Hypertension   . Myocardial infarction 1998  . Environmental allergies     uses inhalers  . Diabetes mellitus without complication     Type 1  . Umbilical hernia   . Headache(784.0)   . Multiple sclerosis     just diagnosed 2014  . Peripheral vascular disease     aortic artery occlusion  . Chronic kidney disease     End stage renal disease, will be starting dialysis on 10/08/13  . Complication of anesthesia   . PONV (postoperative nausea and vomiting)   . Umbilical hernia   . Nonalcoholic steatohepatitis (NASH)     Medications:  Prescriptions prior to admission  Medication Sig Dispense Refill Last Dose  . albuterol (PROAIR HFA) 108 (90 BASE) MCG/ACT inhaler Inhale 2 puffs into the lungs 3 (three) times daily as needed for wheezing or shortness of breath (allergies).   More than a month at Unknown time  . amLODipine (NORVASC) 10 MG tablet Take 10 mg by mouth daily.   09/15/2014 at Unknown time  . clopidogrel (PLAVIX) 75 MG tablet Take 75 mg by mouth at bedtime.    09/15/2014 at Unknown time  . folic acid-vitamin b complex-vitamin c-selenium-zinc (DIALYVITE) 3 MG TABS tablet Take 1 tablet by mouth daily.   09/15/2014 at Unknown time  . furosemide (LASIX) 80 MG tablet Take 1 tablet (80 mg total) by mouth 2 (two) times daily. 60 tablet 0 09/15/2014 at Unknown time  .  hydrALAZINE (APRESOLINE) 25 MG tablet Take 75 mg by mouth 2 (two) times daily.    09/15/2014 at Unknown time  . insulin glargine (LANTUS) 100 UNIT/ML injection Inject 52 Units into the skin at bedtime.    09/15/2014 at Unknown time  . insulin lispro (HUMALOG) 100 UNIT/ML injection Inject 12 Units into the skin 3 (three) times daily before meals.    09/15/2014 at Unknown time  . lidocaine-prilocaine (EMLA) cream Apply 1 application topically as needed (for pain/ apply prior to dialysis).   Past Week at Unknown time  . metoprolol (TOPROL-XL) 200 MG 24 hr tablet Take 200 mg by mouth 2 (two) times daily.   09/15/2014 at 2030  . Misc. Devices (WHEELCHAIR CUSHION) MISC 1 Units by Does not apply route daily. 1 each 0 04/17/2014 at Unknown time  . Omega-3 Fatty Acids (FISH OIL) 1000 MG CAPS Take 1,000 mg by mouth 2 (two) times daily.   09/15/2014 at Unknown time  . oxyCODONE (OXY IR/ROXICODONE) 5 MG immediate release tablet Take 7.5 mg by mouth every 6 (six) hours as needed for severe pain.    Past Week at Unknown time  . sevelamer carbonate (RENVELA) 800 MG tablet Take 800-1,600 mg by mouth See admin instructions. Take 1 tablet  (800 mg) with snacks and 2 tablets (1600 mg) with meals   09/15/2014 at Unknown time    Assessment: 43yo female  had I&D by hand surgeon 12/1 after pt noticed black spot on finger that became red, swollen, and tender, has been on PO ABX since then but pt c/o continued pain, to begin IV ABX while awaiting surgical consult.  Goal of Therapy:  Pre-HD vanc level 15-25  Plan:  Rec'd vanc 1g at OSH; will give additional vancomycin 750mg  now for total load of 1750mg  then start 1000mg  IV after each HD as well as Zosyn 2.25g IV Q8H and monitor CBC, Cx, levels prn.  Wynona Neat, PharmD, BCPS  09/22/2014,12:02 AM

## 2014-09-22 NOTE — H&P (Signed)
Reason for Consult:finger infection Referring Physician: Hospitalist  CC: My finger is worse  HPI:  Carlos Quackenbush Brener is an 43 y.o. right handed female who presents with  Worsening of finger infection s/p i&d and oral antibiotics.  Pt states since I&D finger has become more painful, swollen, discolored.      Pain is rated at  10  /10 and is described as sharp.  Pain is constant.  Pain is made better by rest/immobilization, worse with motion.   Associated signs/symptoms:  Previous treatment:  I&D, oral antibiotics  Past Medical History  Diagnosis Date  . Coronary artery disease   . Hypertension   . Myocardial infarction 1998  . Environmental allergies     uses inhalers  . Diabetes mellitus without complication     Type 1  . Umbilical hernia   . Headache(784.0)   . Multiple sclerosis     just diagnosed 2014  . Peripheral vascular disease     aortic artery occlusion  . Chronic kidney disease     End stage renal disease, will be starting dialysis on 10/08/13  . Complication of anesthesia   . PONV (postoperative nausea and vomiting)   . Umbilical hernia   . Nonalcoholic steatohepatitis (NASH)     Past Surgical History  Procedure Laterality Date  . Ble amputation bk    . Abdominal surgery  2006    aortic artery occluded, had to "clean it out"  . Cardiac stents    . Cardiac catheterization    . Coronary angioplasty    . Av fistula placement Left 09/24/2013    Procedure: LEFT UPPER EXTREMITY ARTERIOVENOUS (AV) FISTULA CREATION BRACHIAL/CEPHALIC;  Surgeon: Elam Dutch, MD;  Location: [place];  Service: Vascular;  Laterality: Left;  . Insertion of dialysis catheter Right 10/07/2013    Procedure: INSERTION OF DIALYSIS CATHETER;  Surgeon: Conrad Clayton, MD;  Location: [place];  Service: Vascular;  Laterality: Right;  . Irrigation and debridement abscess    . Bascilic vein transposition Left 02/18/2014    Procedure: BASILIC VEIN TRANSPOSITION - 2ND STAGE LEFT ARM;  Surgeon: Elam Dutch, MD;  Location[facility] OR;  Service: Vascular;  Laterality: Left;    Family History  Problem Relation Age of Onset  . Diabetes Mellitus II Mother   . Ovarian cancer Mother   . CAD Father   . Diabetes Mellitus II Father     Social History:  reports that she has been smoking Cigarettes.  She has been smoking about 1.00 pack per day. She has never used smokeless tobacco. She reports that she does not drink alcohol or use illicit drugs.  Allergies:  Allergies  Allergen Reactions  . Metformin And Related Diarrhea and Nausea And Vomiting  . Ibuprofen Other (See Comments)    Cannot take because of low kidney function    Medications: I have reviewed the patient's current medications.  Results for orders placed or performed during the hospital encounter of 09/21/14 (from the past 48 hour(s))  Glucose, capillary     Status: Abnormal   Collection Time: 09/21/14 11:51 PM  Result Value Ref Range   Glucose-Capillary 108 (H) 70 - 99 mg/dL  Protime-INR     Status: None   Collection Time: 09/22/14  1:25 AM  Result Value Ref Range   Prothrombin Time 14.0 11.6 - 15.2 seconds   INR 1.07 0.00 - 1.49  Lactic acid, plasma     Status: None   Collection Time: 09/22/14  1:25  AM  Result Value Ref Range   Lactic Acid, Venous 1.5 0.5 - 2.2 mmol/L  hCG, quantitative, pregnancy     Status: None   Collection Time: 09/22/14  1:25 AM  Result Value Ref Range   hCG, Beta Chain, Quant, S <1 <5 mIU/mL    Comment:          GEST. AGE      CONC.  (mIU/mL)   <=1 WEEK        5 - 50     2 WEEKS       50 - 500     3 WEEKS       100 - 10,000     4 WEEKS     1,000 - 30,000     5 WEEKS     3,500 - 115,000   6-8 WEEKS     12,000 - 270,000    12 WEEKS     15,000 - 220,000        FEMALE AND NON-PREGNANT FEMALE:     LESS THAN 5 mIU/mL   Comprehensive metabolic panel     Status: Abnormal   Collection Time: 09/22/14  1:25 AM  Result Value Ref Range   Sodium 135 (L) 137 - 147 mEq/L   Potassium 4.4 3.7 - 5.3  mEq/L   Chloride 92 (L) 96 - 112 mEq/L   CO2 27 19 - 32 mEq/L   Glucose, Bld 144 (H) 70 - 99 mg/dL   BUN 48 (H) 6 - 23 mg/dL   Creatinine, Ser 3.21 (H) 0.50 - 1.10 mg/dL   Calcium 8.9 8.4 - 10.5 mg/dL   Total Protein 6.7 6.0 - 8.3 g/dL   Albumin 3.0 (L) 3.5 - 5.2 g/dL   AST 9 0 - 37 U/L   ALT 14 0 - 35 U/L   Alkaline Phosphatase 111 39 - 117 U/L   Total Bilirubin 0.3 0.3 - 1.2 mg/dL   GFR calc non Af Amer 17 (L) >90 mL/min   GFR calc Af Amer 19 (L) >90 mL/min    Comment: (NOTE) The eGFR has been calculated using the CKD EPI equation. This calculation has not been validated in all clinical situations. eGFR's persistently <90 mL/min signify possible Chronic Kidney Disease.    Anion gap 16 (H) 5 - 15  CBC     Status: Abnormal   Collection Time: 09/22/14  1:25 AM  Result Value Ref Range   WBC 7.6 4.0 - 10.5 K/uL   RBC 4.18 3.87 - 5.11 MIL/uL   Hemoglobin 12.0 12.0 - 15.0 g/dL   HCT 35.9 (L) 36.0 - 46.0 %   MCV 85.9 78.0 - 100.0 fL   MCH 28.7 26.0 - 34.0 pg   MCHC 33.4 30.0 - 36.0 g/dL   RDW 13.6 11.5 - 15.5 %   Platelets 197 150 - 400 K/uL  APTT     Status: None   Collection Time: 09/22/14  1:25 AM  Result Value Ref Range   aPTT 37 24 - 37 seconds    Comment:        IF BASELINE aPTT IS ELEVATED, SUGGEST PATIENT RISK ASSESSMENT BE USED TO DETERMINE APPROPRIATE ANTICOAGULANT THERAPY.   Glucose, capillary     Status: None   Collection Time: 09/22/14  6:26 AM  Result Value Ref Range   Glucose-Capillary 80 70 - 99 mg/dL  Surgical pcr screen     Status: Abnormal   Collection Time: 09/22/14  9:36 AM  Result Value Ref  Range   MRSA, PCR NEGATIVE NEGATIVE   Staphylococcus aureus POSITIVE (A) NEGATIVE    Comment:        The Xpert SA Assay (FDA approved for NASAL specimens in patients over 49 years of age), is one component of a comprehensive surveillance program.  Test performance has been validated by [employer] for patients greater than or equal to 42 year old. It  is not intended to diagnose infection nor to guide or monitor treatment.     No results found.  Pertinent items are noted in HPI. Temp:  [97.3 F (36.3 C)-98.6 F (37 C)] 97.3 F (36.3 C) (12/07 1100) Pulse Rate:  [52-61] 55 (12/07 1100) Resp:  [18] 18 (12/07 1100) BP: (120-141)/(49-58) 135/58 mmHg (12/07 1100) SpO2:  [96 %-99 %] 99 % (12/07 1100) Weight:  [90.719 kg (200 lb)] 90.719 kg (200 lb) (12/06 2226) Extremities: Left ring finger with swelling, drainage, discoloration   Assessment: Infected Left ring finger Plan: Proceed with i&d, ? Amputation of ring finger I have discussed this treatment plan in detail with patient, including the risks of the recommended treatment or surgery, the benefits and the alternatives.  The patient understands that additional treatment may be necessary.  Anice Wilshire CHRISTOPHER 09/22/2014, 11:43 AM

## 2014-09-22 NOTE — Anesthesia Postprocedure Evaluation (Signed)
  Anesthesia Post-op Note  Patient: Angela Soto  Procedure(s) Performed: Procedure(s): AMPUTATION LEFT FOURTH FINGER (Left)  Patient Location: PACU  Anesthesia Type:General  Level of Consciousness: awake, alert , oriented and patient cooperative  Airway and Oxygen Therapy: Patient Spontanous Breathing  Post-op Pain: none  Post-op Assessment: Post-op Vital signs reviewed, Patient's Cardiovascular Status Stable, Respiratory Function Stable, Patent Airway, No signs of Nausea or vomiting and Pain level controlled  Post-op Vital Signs: Reviewed and stable  Last Vitals:  Filed Vitals:   09/22/14 1401  BP: 115/47  Pulse: 51  Temp:   Resp: 12    Complications: No apparent anesthesia complications

## 2014-09-22 NOTE — Anesthesia Preprocedure Evaluation (Addendum)
Anesthesia Evaluation  Patient identified by MRN, date of birth, ID band Patient awake    Reviewed: Allergy & Precautions, H&P , NPO status , Patient's Chart, lab work & pertinent test results  History of Anesthesia Complications Negative for: history of anesthetic complications  Airway Mallampati: II  TM Distance: >3 FB Neck ROM: Full    Dental  (+) Edentulous Upper, Missing, Dental Advisory Given   Pulmonary COPD COPD inhaler, Current Smoker,  breath sounds clear to auscultation        Cardiovascular hypertension, Pt. on medications - angina+ CAD, + Past MI (1998), + Cardiac Stents and + Peripheral Vascular Disease Rhythm:Regular Rate:Normal     Neuro/Psych negative neurological ROS     GI/Hepatic negative GI ROS, Neg liver ROS,   Endo/Other  diabetes (glu 74), Insulin DependentMorbid obesity  Renal/GU ESRF and DialysisRenal disease (MWF, creat 3.21)     Musculoskeletal   Abdominal (+) + obese,   Peds  Hematology   Anesthesia Other Findings   Reproductive/Obstetrics LMP a year ago                          Anesthesia Physical Anesthesia Plan  ASA: III  Anesthesia Plan: General   Post-op Pain Management:    Induction: Intravenous  Airway Management Planned: LMA  Additional Equipment: None  Intra-op Plan:   Post-operative Plan: Extubation in OR  Informed Consent: I have reviewed the patients History and Physical, chart, labs and discussed the procedure including the risks, benefits and alternatives for the proposed anesthesia with the patient or authorized representative who has indicated his/her understanding and acceptance.   Dental advisory given  Plan Discussed with: CRNA, Surgeon and Anesthesiologist  Anesthesia Plan Comments: (Plan routine monitors, GA- LMA OK)       Anesthesia Quick Evaluation

## 2014-09-22 NOTE — Transfer of Care (Signed)
Immediate Anesthesia Transfer of Care Note  Patient: Angela Soto  Procedure(s) Performed: Procedure(s): AMPUTATION LEFT FOURTH FINGER (Left)  Patient Location: PACU  Anesthesia Type:General  Level of Consciousness: awake  Airway & Oxygen Therapy: Patient Spontanous Breathing and Patient connected to nasal cannula oxygen  Post-op Assessment: Report given to PACU RN, Post -op Vital signs reviewed and stable and Patient moving all extremities  Post vital signs: Reviewed and stable  Complications: No apparent anesthesia complications

## 2014-09-23 ENCOUNTER — Encounter (HOSPITAL_COMMUNITY): Payer: Self-pay | Admitting: General Surgery

## 2014-09-23 ENCOUNTER — Inpatient Hospital Stay (HOSPITAL_COMMUNITY): Payer: Medicare Other

## 2014-09-23 DIAGNOSIS — M545 Low back pain: Secondary | ICD-10-CM | POA: Diagnosis not present

## 2014-09-23 DIAGNOSIS — G608 Other hereditary and idiopathic neuropathies: Secondary | ICD-10-CM | POA: Diagnosis not present

## 2014-09-23 DIAGNOSIS — G8929 Other chronic pain: Secondary | ICD-10-CM | POA: Diagnosis not present

## 2014-09-23 DIAGNOSIS — M25561 Pain in right knee: Secondary | ICD-10-CM | POA: Diagnosis not present

## 2014-09-23 DIAGNOSIS — M25562 Pain in left knee: Secondary | ICD-10-CM | POA: Diagnosis not present

## 2014-09-23 DIAGNOSIS — M4327 Fusion of spine, lumbosacral region: Secondary | ICD-10-CM | POA: Diagnosis not present

## 2014-09-23 DIAGNOSIS — M25552 Pain in left hip: Secondary | ICD-10-CM | POA: Diagnosis not present

## 2014-09-23 LAB — BASIC METABOLIC PANEL
Anion gap: 13 (ref 5–15)
BUN: 27 mg/dL — ABNORMAL HIGH (ref 6–23)
CO2: 25 meq/L (ref 19–32)
Calcium: 8.7 mg/dL (ref 8.4–10.5)
Chloride: 99 mEq/L (ref 96–112)
Creatinine, Ser: 2.32 mg/dL — ABNORMAL HIGH (ref 0.50–1.10)
GFR calc Af Amer: 28 mL/min — ABNORMAL LOW (ref >90)
GFR calc non Af Amer: 25 mL/min — ABNORMAL LOW (ref >90)
Glucose, Bld: 77 mg/dL (ref 70–99)
POTASSIUM: 3.9 meq/L (ref 3.7–5.3)
SODIUM: 137 meq/L (ref 137–147)

## 2014-09-23 LAB — GLUCOSE, CAPILLARY
GLUCOSE-CAPILLARY: 91 mg/dL (ref 70–99)
GLUCOSE-CAPILLARY: 103 mg/dL — AB (ref 70–99)

## 2014-09-23 MED ORDER — VANCOMYCIN HCL IN DEXTROSE 1-5 GM/200ML-% IV SOLN: 1000.0000 mg | INTRAVENOUS | Status: DC

## 2014-09-23 NOTE — Op Note (Signed)
NAMEHAILLY, Angela Soto               ACCOUNT NO.:  [id number]  MEDICAL RECORD NO.:  [id number]  LOCATION:  [id number]                        FACILITY:  [facility]  PHYSICIAN:  Dennie Bible, MD    DATE OF BIRTH:  01-11-1971  DATE OF PROCEDURE:  09/22/2014 DATE OF DISCHARGE:                              OPERATIVE REPORT   PREOPERATIVE DIAGNOSIS:  Infection and gangrene of the left ring finger.  POSTOPERATIVE DIAGNOSIS:  Infection and gangrene of the left ring finger.  PROCEDURE:  Partial amputation of the left ring finger.  ANESTHESIA:  General.  ESTIMATED BLOOD LOSS:  Minimal.  COMPLICATIONS:  No acute complications.  SPECIMENS:  Amputated part.  INDICATIONS:  [name] is a 43 year old patient who has end-stage renal disease who was seen in the office last week with an infection of her left ring finger, this was I and D'd.  Since that time, her infection has become worse with evidence of gangrenous changes to the tip.  She was admitted last night for my evaluation and definitive care. On evaluation, she had gangrenous changes of her left ring finger tip. Risks, benefits, and alternatives of surgery were thoroughly discussed with her.  She was okay with the idea of amputation due to her significant pain and previous histories of these infections.  Consent was obtained.  DESCRIPTION OF PROCEDURE:  The patient was taken to the operating room, placed supine on the operating room table.  General anesthesia was administered without difficulty.  The left upper extremity was prepped and draped in normal sterile fashion.  On evaluation, she had wet gangrenous changes to the left ring finger tip as well as the dorsal aspect over the middle phalanx where the previous I and D site was performed.  An incision proximally was made on the dorsal aspect down to the bone and circumferential.  The bone was then transected and the amputated part was passed off the field.  There was some blood  supply. The volar aspect of the digit and soft tissues appeared to be okay.  In the dorsal aspect, there appeared to be some nonviable tissue that was further debrided more proximally.  At the level of the amputation, the bone did not appear to be involved.  After thorough irrigation, the wound was slightly approximated with multiple interrupted sutures.  She will need to be monitored closely for possible further proximal spread or devitalization of tissues and additional revision amputations.     Dennie Bible, MD     HCC/MEDQ  D:  09/22/2014  T:  09/23/2014  Job:  [id number]

## 2014-09-23 NOTE — Discharge Summary (Signed)
Physician Discharge Summary  Angela Soto [id number] DOB: [birth year] DOA: 09/21/2014  PCP: Doran Heater, MD  Admit date: 09/21/2014 Discharge date: 09/23/2014  Time spent: > 35 minutes  Recommendations for Outpatient Follow-up:  1. Please see d/c instructions below 2. Pt is to continue her vancomycin during dialysis sessions per recommendations from the orthopaedic surgeon  Discharge Diagnoses:  Principal Problem:   Finger infection Active Problems:   Type II diabetes mellitus with neurological manifestations not at goal   HTN (hypertension)   CAD (coronary artery disease)   Tobacco abuse   Atherosclerotic peripheral vascular disease   Demyelinating disorder   End stage renal disease on dialysis   Discharge Condition: stable  Diet recommendation: diabetic diet  Filed Weights   09/22/14 1748 09/22/14 2130 09/23/14 0644  Weight: 96 kg (211 lb 10.3 oz) 90.6 kg (199 lb 11.8 oz) 95.9 kg (211 lb 6.7 oz)    History of present illness:  43 y/o with PMH listed above who presented with gangrenous and infected finger.  Hospital Course:  Principal Problem:  Finger infection - pt is s/p partial amputation of the left ring finger - Pt to continue vancomycin with dialysis sessions - f/u with hand surgeon next week  Active Problems:  Type II diabetes mellitus with neurological manifestations not at goal -Carb modified diet - Pt to continue home insulin regimen and f/u with her pcp after d/c for further evaluation and recommendations.   HTN (hypertension) -Patient currently on amlodipine, hydralazine   CAD (coronary artery disease) -We'll continue Lovaza, stable no chest pain reported   Tobacco abuse - We'll continue recommend cessation - Continue nicotine patch while in house   Atherosclerotic peripheral vascular disease -Patient on Plavix and statin   Demyelinating disorder - Patient to follow-up with physician managing this disorder after discharging  from hospital. For now continue current regimen.   End stage renal disease on dialysis -Stable, continue home dialysis sessions with vancomycin administration during dialysis   Procedures:  As listed above  Consultations:  Hand surgeon: Dr. Lenon Curt  Discharge Exam: Filed Vitals:   09/23/14 0644  BP: 150/55  Pulse: 59  Temp: 98.5 F (36.9 C)  Resp: 16    General: Pt in nad, alert and awake Cardiovascular: rrr, no mrg Respiratory: cta bl, no wheezes  Discharge Instructions You were cared for by a hospitalist during your hospital stay. If you have any questions about your discharge medications or the care you received while you were in the hospital after you are discharged, you can call the unit and asked to speak with the hospitalist on call if the hospitalist that took care of you is not available. Once you are discharged, your primary care physician will handle any further medical issues. Please note that NO REFILLS for any discharge medications will be authorized once you are discharged, as it is imperative that you return to your primary care physician (or establish a relationship with a primary care physician if you do not have one) for your aftercare needs so that they can reassess your need for medications and monitor your lab values.  Discharge Instructions    Call MD for:  redness, tenderness, or signs of infection (pain, swelling, redness, odor or green/yellow discharge around incision site)    Complete by:  As directed      Call MD for:  temperature >100.4    Complete by:  As directed      Diet - low sodium heart healthy  Complete by:  As directed      Discharge instructions    Complete by:  As directed   Please have your nephrologist continue your vancomycin during dialysis. Also you will need to follow up with Dr. Lenon Curt (your hand surgeon) within the next week. Call his office for details regarding appointment date. Please call you Hand surgeon for recommendations  regarding dressing changes. Given that your pain will be managed by your pain specialist and you have an appointment today (discharge day). I will defer pain management recommendations to them.     Increase activity slowly    Complete by:  As directed           Current Discharge Medication List    START taking these medications   Details  vancomycin (VANCOCIN) 1 GM/200ML SOLN Inject 200 mLs (1,000 mg total) into the vein every Monday, Wednesday, and Friday with hemodialysis. Qty: 4000 mL      CONTINUE these medications which have NOT CHANGED   Details  albuterol (PROAIR HFA) 108 (90 BASE) MCG/ACT inhaler Inhale 2 puffs into the lungs 3 (three) times daily as needed for wheezing or shortness of breath (allergies).    amLODipine (NORVASC) 10 MG tablet Take 10 mg by mouth daily.    clopidogrel (PLAVIX) 75 MG tablet Take 75 mg by mouth at bedtime.     folic acid-vitamin b complex-vitamin c-selenium-zinc (DIALYVITE) 3 MG TABS tablet Take 1 tablet by mouth daily.    furosemide (LASIX) 80 MG tablet Take 1 tablet (80 mg total) by mouth 2 (two) times daily. Qty: 60 tablet, Refills: 0    hydrALAZINE (APRESOLINE) 25 MG tablet Take 75 mg by mouth 2 (two) times daily.     insulin glargine (LANTUS) 100 UNIT/ML injection Inject 52 Units into the skin at bedtime.     insulin lispro (HUMALOG) 100 UNIT/ML injection Inject 12 Units into the skin 3 (three) times daily before meals.     lidocaine-prilocaine (EMLA) cream Apply 1 application topically as needed (for pain/ apply prior to dialysis).    metoprolol (TOPROL-XL) 200 MG 24 hr tablet Take 200 mg by mouth 2 (two) times daily.    Omega-3 Fatty Acids (FISH OIL) 1000 MG CAPS Take 1,000 mg by mouth 3 (three) times daily.     OxyCODONE HCl 7.5 MG TABA Take 7.5 mg by mouth every 4 (four) hours as needed (for pain).    promethazine (PHENERGAN) 25 MG tablet Take 25 mg by mouth every 6 (six) hours as needed for nausea or vomiting.    sevelamer  carbonate (RENVELA) 800 MG tablet Take 800-1,600 mg by mouth See admin instructions. Take 1 tablet  (800 mg) with snacks and 2 tablets (1600 mg) with meals       Allergies  Allergen Reactions  . Metformin And Related Diarrhea and Nausea And Vomiting  . Ibuprofen Other (See Comments)    Cannot take because of low kidney function      The results of significant diagnostics from this hospitalization (including imaging, microbiology, ancillary and laboratory) are listed below for reference.    Significant Diagnostic Studies: No results found.  Microbiology: Recent Results (from the past 240 hour(s))  Culture, blood (routine x 2)     Status: None (Preliminary result)   Collection Time: 09/22/14  1:15 AM  Result Value Ref Range Status   Specimen Description BLOOD RIGHT ARM  Final   Special Requests BOTTLES DRAWN AEROBIC AND ANAEROBIC 10CC EA  Final   Culture  Setup Time   Final    09/22/2014 09:32 Performed at [facility]    Culture   Final           BLOOD CULTURE RECEIVED NO GROWTH TO DATE CULTURE WILL BE HELD FOR 5 DAYS BEFORE ISSUING A FINAL NEGATIVE REPORT Performed at [facility]    Report Status PENDING  Incomplete  Culture, blood (routine x 2)     Status: None (Preliminary result)   Collection Time: 09/22/14  1:25 AM  Result Value Ref Range Status   Specimen Description BLOOD RIGHT HAND  Final   Special Requests BOTTLES DRAWN AEROBIC ONLY 10CC  Final   Culture  Setup Time   Final    09/22/2014 09:32 Performed at [facility]    Culture   Final           BLOOD CULTURE RECEIVED NO GROWTH TO DATE CULTURE WILL BE HELD FOR 5 DAYS BEFORE ISSUING A FINAL NEGATIVE REPORT Performed at [facility]    Report Status PENDING  Incomplete  Surgical pcr screen     Status: Abnormal   Collection Time: 09/22/14  9:36 AM  Result Value Ref Range Status   MRSA, PCR NEGATIVE NEGATIVE Final   Staphylococcus aureus POSITIVE (A) NEGATIVE Final     Comment:        The Xpert SA Assay (FDA approved for NASAL specimens in patients over 25 years of age), is one component of a comprehensive surveillance program.  Test performance has been validated by [employer] for patients greater than or equal to 5 year old. It is not intended to diagnose infection nor to guide or monitor treatment.      Labs: Basic Metabolic Panel:  Recent Labs Lab 09/22/14 0125 09/22/14 1300 09/23/14 0506  NA 135* 133* 137  K 4.4 4.8 3.9  CL 92* 93* 99  CO2 27 24 25   GLUCOSE 144* 120* 77  BUN 48* 55* 27*  CREATININE 3.21* 3.57* 2.32*  CALCIUM 8.9 8.5 8.7  PHOS  --  6.3*  --    Liver Function Tests:  Recent Labs Lab 09/22/14 0125 09/22/14 1300  AST 9  --   ALT 14  --   ALKPHOS 111  --   BILITOT 0.3  --   PROT 6.7  --   ALBUMIN 3.0* 2.6*   No results for input(s): LIPASE, AMYLASE in the last 168 hours. No results for input(s): AMMONIA in the last 168 hours. CBC:  Recent Labs Lab 09/22/14 0125 09/22/14 1300  WBC 7.6 7.1  HGB 12.0 11.4*  HCT 35.9* 33.9*  MCV 85.9 85.0  PLT 197 174   Cardiac Enzymes: No results for input(s): CKTOTAL, CKMB, CKMBINDEX, TROPONINI in the last 168 hours. BNP: BNP (last 3 results) No results for input(s): PROBNP in the last 8760 hours. CBG:  Recent Labs Lab 09/22/14 1249 09/22/14 1400 09/22/14 1648 09/22/14 2304 09/23/14 0631  GLUCAP 74 85 123* 131* 91       Signed:  Velvet Bathe  [facility] 09/23/2014, 11:19 AM

## 2014-09-23 NOTE — Plan of Care (Signed)
Problem: Phase II Progression Outcomes Goal: Vital signs remain stable Outcome: Completed/Met Date Met:  09/23/14     

## 2014-09-23 NOTE — Clinical Social Work Note (Signed)
CSW spoke with patient regarding transportation.  Patient states she has arranged [facility] transportation to pick her up between 1:30-2pm.  Patient to meet [facility] at entrance.  No transportation needs at this time.  CSW signing off.  Nonnie Done, [place] [phone]  Psychiatric & Orthopedics (5N 1-16) Clinical Social Worker

## 2014-09-25 ENCOUNTER — Encounter (HOSPITAL_COMMUNITY): Payer: Self-pay | Admitting: Vascular Surgery

## 2014-09-28 LAB — CULTURE, BLOOD (ROUTINE X 2)
CULTURE: NO GROWTH
Culture: NO GROWTH

## 2014-10-13 DIAGNOSIS — E119 Type 2 diabetes mellitus without complications: Secondary | ICD-10-CM | POA: Diagnosis not present

## 2014-10-13 DIAGNOSIS — Z794 Long term (current) use of insulin: Secondary | ICD-10-CM | POA: Diagnosis not present

## 2014-10-16 DIAGNOSIS — Z992 Dependence on renal dialysis: Secondary | ICD-10-CM | POA: Diagnosis not present

## 2014-10-16 DIAGNOSIS — N186 End stage renal disease: Secondary | ICD-10-CM | POA: Diagnosis not present

## 2014-10-17 DIAGNOSIS — D509 Iron deficiency anemia, unspecified: Secondary | ICD-10-CM | POA: Diagnosis not present

## 2014-10-17 DIAGNOSIS — Z992 Dependence on renal dialysis: Secondary | ICD-10-CM | POA: Diagnosis not present

## 2014-10-17 DIAGNOSIS — N2581 Secondary hyperparathyroidism of renal origin: Secondary | ICD-10-CM | POA: Diagnosis not present

## 2014-10-17 DIAGNOSIS — R110 Nausea: Secondary | ICD-10-CM | POA: Diagnosis not present

## 2014-10-17 DIAGNOSIS — N186 End stage renal disease: Secondary | ICD-10-CM | POA: Diagnosis not present

## 2014-10-20 DIAGNOSIS — R110 Nausea: Secondary | ICD-10-CM | POA: Diagnosis not present

## 2014-10-20 DIAGNOSIS — Z992 Dependence on renal dialysis: Secondary | ICD-10-CM | POA: Diagnosis not present

## 2014-10-20 DIAGNOSIS — N2581 Secondary hyperparathyroidism of renal origin: Secondary | ICD-10-CM | POA: Diagnosis not present

## 2014-10-20 DIAGNOSIS — N186 End stage renal disease: Secondary | ICD-10-CM | POA: Diagnosis not present

## 2014-10-20 DIAGNOSIS — D509 Iron deficiency anemia, unspecified: Secondary | ICD-10-CM | POA: Diagnosis not present

## 2014-10-23 DIAGNOSIS — Z79891 Long term (current) use of opiate analgesic: Secondary | ICD-10-CM | POA: Diagnosis not present

## 2014-10-23 DIAGNOSIS — G8929 Other chronic pain: Secondary | ICD-10-CM | POA: Diagnosis not present

## 2014-10-23 DIAGNOSIS — M79652 Pain in left thigh: Secondary | ICD-10-CM | POA: Diagnosis not present

## 2014-10-23 DIAGNOSIS — M79651 Pain in right thigh: Secondary | ICD-10-CM | POA: Diagnosis not present

## 2014-10-23 DIAGNOSIS — M545 Low back pain: Secondary | ICD-10-CM | POA: Diagnosis not present

## 2014-10-24 DIAGNOSIS — R110 Nausea: Secondary | ICD-10-CM | POA: Diagnosis not present

## 2014-10-24 DIAGNOSIS — N2581 Secondary hyperparathyroidism of renal origin: Secondary | ICD-10-CM | POA: Diagnosis not present

## 2014-10-24 DIAGNOSIS — N186 End stage renal disease: Secondary | ICD-10-CM | POA: Diagnosis not present

## 2014-10-24 DIAGNOSIS — D509 Iron deficiency anemia, unspecified: Secondary | ICD-10-CM | POA: Diagnosis not present

## 2014-10-24 DIAGNOSIS — Z992 Dependence on renal dialysis: Secondary | ICD-10-CM | POA: Diagnosis not present

## 2014-10-25 DIAGNOSIS — Z992 Dependence on renal dialysis: Secondary | ICD-10-CM | POA: Diagnosis not present

## 2014-10-25 DIAGNOSIS — N186 End stage renal disease: Secondary | ICD-10-CM | POA: Diagnosis not present

## 2014-10-25 DIAGNOSIS — D509 Iron deficiency anemia, unspecified: Secondary | ICD-10-CM | POA: Diagnosis not present

## 2014-10-25 DIAGNOSIS — R110 Nausea: Secondary | ICD-10-CM | POA: Diagnosis not present

## 2014-10-25 DIAGNOSIS — N2581 Secondary hyperparathyroidism of renal origin: Secondary | ICD-10-CM | POA: Diagnosis not present

## 2014-10-27 DIAGNOSIS — N186 End stage renal disease: Secondary | ICD-10-CM | POA: Diagnosis not present

## 2014-10-27 DIAGNOSIS — Z992 Dependence on renal dialysis: Secondary | ICD-10-CM | POA: Diagnosis not present

## 2014-10-27 DIAGNOSIS — D509 Iron deficiency anemia, unspecified: Secondary | ICD-10-CM | POA: Diagnosis not present

## 2014-10-27 DIAGNOSIS — N2581 Secondary hyperparathyroidism of renal origin: Secondary | ICD-10-CM | POA: Diagnosis not present

## 2014-10-27 DIAGNOSIS — R110 Nausea: Secondary | ICD-10-CM | POA: Diagnosis not present

## 2014-10-29 DIAGNOSIS — N2581 Secondary hyperparathyroidism of renal origin: Secondary | ICD-10-CM | POA: Diagnosis not present

## 2014-10-29 DIAGNOSIS — D509 Iron deficiency anemia, unspecified: Secondary | ICD-10-CM | POA: Diagnosis not present

## 2014-10-29 DIAGNOSIS — N186 End stage renal disease: Secondary | ICD-10-CM | POA: Diagnosis not present

## 2014-10-29 DIAGNOSIS — Z992 Dependence on renal dialysis: Secondary | ICD-10-CM | POA: Diagnosis not present

## 2014-10-29 DIAGNOSIS — R110 Nausea: Secondary | ICD-10-CM | POA: Diagnosis not present

## 2014-10-30 ENCOUNTER — Encounter (HOSPITAL_COMMUNITY): Payer: Self-pay | Admitting: Surgery

## 2014-10-31 DIAGNOSIS — D509 Iron deficiency anemia, unspecified: Secondary | ICD-10-CM | POA: Diagnosis not present

## 2014-10-31 DIAGNOSIS — N186 End stage renal disease: Secondary | ICD-10-CM | POA: Diagnosis not present

## 2014-10-31 DIAGNOSIS — R110 Nausea: Secondary | ICD-10-CM | POA: Diagnosis not present

## 2014-10-31 DIAGNOSIS — Z992 Dependence on renal dialysis: Secondary | ICD-10-CM | POA: Diagnosis not present

## 2014-10-31 DIAGNOSIS — N2581 Secondary hyperparathyroidism of renal origin: Secondary | ICD-10-CM | POA: Diagnosis not present

## 2014-11-03 DIAGNOSIS — R110 Nausea: Secondary | ICD-10-CM | POA: Diagnosis not present

## 2014-11-03 DIAGNOSIS — D509 Iron deficiency anemia, unspecified: Secondary | ICD-10-CM | POA: Diagnosis not present

## 2014-11-03 DIAGNOSIS — N186 End stage renal disease: Secondary | ICD-10-CM | POA: Diagnosis not present

## 2014-11-03 DIAGNOSIS — N2581 Secondary hyperparathyroidism of renal origin: Secondary | ICD-10-CM | POA: Diagnosis not present

## 2014-11-03 DIAGNOSIS — Z992 Dependence on renal dialysis: Secondary | ICD-10-CM | POA: Diagnosis not present

## 2014-11-05 DIAGNOSIS — N2581 Secondary hyperparathyroidism of renal origin: Secondary | ICD-10-CM | POA: Diagnosis not present

## 2014-11-05 DIAGNOSIS — R110 Nausea: Secondary | ICD-10-CM | POA: Diagnosis not present

## 2014-11-05 DIAGNOSIS — N186 End stage renal disease: Secondary | ICD-10-CM | POA: Diagnosis not present

## 2014-11-05 DIAGNOSIS — Z992 Dependence on renal dialysis: Secondary | ICD-10-CM | POA: Diagnosis not present

## 2014-11-05 DIAGNOSIS — D509 Iron deficiency anemia, unspecified: Secondary | ICD-10-CM | POA: Diagnosis not present

## 2014-11-10 DIAGNOSIS — Z992 Dependence on renal dialysis: Secondary | ICD-10-CM | POA: Diagnosis not present

## 2014-11-10 DIAGNOSIS — N186 End stage renal disease: Secondary | ICD-10-CM | POA: Diagnosis not present

## 2014-11-10 DIAGNOSIS — N2581 Secondary hyperparathyroidism of renal origin: Secondary | ICD-10-CM | POA: Diagnosis not present

## 2014-11-10 DIAGNOSIS — D509 Iron deficiency anemia, unspecified: Secondary | ICD-10-CM | POA: Diagnosis not present

## 2014-11-10 DIAGNOSIS — R110 Nausea: Secondary | ICD-10-CM | POA: Diagnosis not present

## 2014-11-12 DIAGNOSIS — N2581 Secondary hyperparathyroidism of renal origin: Secondary | ICD-10-CM | POA: Diagnosis not present

## 2014-11-12 DIAGNOSIS — Z992 Dependence on renal dialysis: Secondary | ICD-10-CM | POA: Diagnosis not present

## 2014-11-12 DIAGNOSIS — R110 Nausea: Secondary | ICD-10-CM | POA: Diagnosis not present

## 2014-11-12 DIAGNOSIS — N186 End stage renal disease: Secondary | ICD-10-CM | POA: Diagnosis not present

## 2014-11-12 DIAGNOSIS — D509 Iron deficiency anemia, unspecified: Secondary | ICD-10-CM | POA: Diagnosis not present

## 2014-11-14 DIAGNOSIS — R110 Nausea: Secondary | ICD-10-CM | POA: Diagnosis not present

## 2014-11-14 DIAGNOSIS — Z992 Dependence on renal dialysis: Secondary | ICD-10-CM | POA: Diagnosis not present

## 2014-11-14 DIAGNOSIS — D509 Iron deficiency anemia, unspecified: Secondary | ICD-10-CM | POA: Diagnosis not present

## 2014-11-14 DIAGNOSIS — N186 End stage renal disease: Secondary | ICD-10-CM | POA: Diagnosis not present

## 2014-11-14 DIAGNOSIS — N2581 Secondary hyperparathyroidism of renal origin: Secondary | ICD-10-CM | POA: Diagnosis not present

## 2014-11-16 DIAGNOSIS — Z992 Dependence on renal dialysis: Secondary | ICD-10-CM | POA: Diagnosis not present

## 2014-11-16 DIAGNOSIS — N186 End stage renal disease: Secondary | ICD-10-CM | POA: Diagnosis not present

## 2014-11-17 DIAGNOSIS — D509 Iron deficiency anemia, unspecified: Secondary | ICD-10-CM | POA: Diagnosis not present

## 2014-11-17 DIAGNOSIS — Z992 Dependence on renal dialysis: Secondary | ICD-10-CM | POA: Diagnosis not present

## 2014-11-17 DIAGNOSIS — N2581 Secondary hyperparathyroidism of renal origin: Secondary | ICD-10-CM | POA: Diagnosis not present

## 2014-11-17 DIAGNOSIS — N186 End stage renal disease: Secondary | ICD-10-CM | POA: Diagnosis not present

## 2014-11-19 DIAGNOSIS — D509 Iron deficiency anemia, unspecified: Secondary | ICD-10-CM | POA: Diagnosis not present

## 2014-11-19 DIAGNOSIS — Z992 Dependence on renal dialysis: Secondary | ICD-10-CM | POA: Diagnosis not present

## 2014-11-19 DIAGNOSIS — N186 End stage renal disease: Secondary | ICD-10-CM | POA: Diagnosis not present

## 2014-11-19 DIAGNOSIS — N2581 Secondary hyperparathyroidism of renal origin: Secondary | ICD-10-CM | POA: Diagnosis not present

## 2014-11-20 DIAGNOSIS — M79604 Pain in right leg: Secondary | ICD-10-CM | POA: Diagnosis not present

## 2014-11-20 DIAGNOSIS — G8929 Other chronic pain: Secondary | ICD-10-CM | POA: Diagnosis not present

## 2014-11-20 DIAGNOSIS — M25561 Pain in right knee: Secondary | ICD-10-CM | POA: Diagnosis not present

## 2014-11-20 DIAGNOSIS — M79605 Pain in left leg: Secondary | ICD-10-CM | POA: Diagnosis not present

## 2014-11-20 DIAGNOSIS — M79642 Pain in left hand: Secondary | ICD-10-CM | POA: Diagnosis not present

## 2014-11-20 DIAGNOSIS — M25562 Pain in left knee: Secondary | ICD-10-CM | POA: Diagnosis not present

## 2014-11-20 DIAGNOSIS — M545 Low back pain: Secondary | ICD-10-CM | POA: Diagnosis not present

## 2014-11-21 DIAGNOSIS — Z992 Dependence on renal dialysis: Secondary | ICD-10-CM | POA: Diagnosis not present

## 2014-11-21 DIAGNOSIS — N186 End stage renal disease: Secondary | ICD-10-CM | POA: Diagnosis not present

## 2014-11-21 DIAGNOSIS — D509 Iron deficiency anemia, unspecified: Secondary | ICD-10-CM | POA: Diagnosis not present

## 2014-11-21 DIAGNOSIS — N2581 Secondary hyperparathyroidism of renal origin: Secondary | ICD-10-CM | POA: Diagnosis not present

## 2014-11-24 DIAGNOSIS — Z992 Dependence on renal dialysis: Secondary | ICD-10-CM | POA: Diagnosis not present

## 2014-11-24 DIAGNOSIS — N2581 Secondary hyperparathyroidism of renal origin: Secondary | ICD-10-CM | POA: Diagnosis not present

## 2014-11-24 DIAGNOSIS — N186 End stage renal disease: Secondary | ICD-10-CM | POA: Diagnosis not present

## 2014-11-24 DIAGNOSIS — D509 Iron deficiency anemia, unspecified: Secondary | ICD-10-CM | POA: Diagnosis not present

## 2014-11-25 DIAGNOSIS — E1159 Type 2 diabetes mellitus with other circulatory complications: Secondary | ICD-10-CM | POA: Diagnosis not present

## 2014-11-25 DIAGNOSIS — I10 Essential (primary) hypertension: Secondary | ICD-10-CM | POA: Diagnosis not present

## 2014-11-26 DIAGNOSIS — Z992 Dependence on renal dialysis: Secondary | ICD-10-CM | POA: Diagnosis not present

## 2014-11-26 DIAGNOSIS — D509 Iron deficiency anemia, unspecified: Secondary | ICD-10-CM | POA: Diagnosis not present

## 2014-11-26 DIAGNOSIS — N186 End stage renal disease: Secondary | ICD-10-CM | POA: Diagnosis not present

## 2014-11-26 DIAGNOSIS — N2581 Secondary hyperparathyroidism of renal origin: Secondary | ICD-10-CM | POA: Diagnosis not present

## 2014-11-28 DIAGNOSIS — Z992 Dependence on renal dialysis: Secondary | ICD-10-CM | POA: Diagnosis not present

## 2014-11-28 DIAGNOSIS — D509 Iron deficiency anemia, unspecified: Secondary | ICD-10-CM | POA: Diagnosis not present

## 2014-11-28 DIAGNOSIS — N186 End stage renal disease: Secondary | ICD-10-CM | POA: Diagnosis not present

## 2014-11-28 DIAGNOSIS — N2581 Secondary hyperparathyroidism of renal origin: Secondary | ICD-10-CM | POA: Diagnosis not present

## 2014-12-02 DIAGNOSIS — N2581 Secondary hyperparathyroidism of renal origin: Secondary | ICD-10-CM | POA: Diagnosis not present

## 2014-12-02 DIAGNOSIS — N186 End stage renal disease: Secondary | ICD-10-CM | POA: Diagnosis not present

## 2014-12-02 DIAGNOSIS — D509 Iron deficiency anemia, unspecified: Secondary | ICD-10-CM | POA: Diagnosis not present

## 2014-12-02 DIAGNOSIS — Z992 Dependence on renal dialysis: Secondary | ICD-10-CM | POA: Diagnosis not present

## 2014-12-03 DIAGNOSIS — Z992 Dependence on renal dialysis: Secondary | ICD-10-CM | POA: Diagnosis not present

## 2014-12-03 DIAGNOSIS — N2581 Secondary hyperparathyroidism of renal origin: Secondary | ICD-10-CM | POA: Diagnosis not present

## 2014-12-03 DIAGNOSIS — D509 Iron deficiency anemia, unspecified: Secondary | ICD-10-CM | POA: Diagnosis not present

## 2014-12-03 DIAGNOSIS — N186 End stage renal disease: Secondary | ICD-10-CM | POA: Diagnosis not present

## 2014-12-05 DIAGNOSIS — D509 Iron deficiency anemia, unspecified: Secondary | ICD-10-CM | POA: Diagnosis not present

## 2014-12-05 DIAGNOSIS — N186 End stage renal disease: Secondary | ICD-10-CM | POA: Diagnosis not present

## 2014-12-05 DIAGNOSIS — Z992 Dependence on renal dialysis: Secondary | ICD-10-CM | POA: Diagnosis not present

## 2014-12-05 DIAGNOSIS — N2581 Secondary hyperparathyroidism of renal origin: Secondary | ICD-10-CM | POA: Diagnosis not present

## 2014-12-08 DIAGNOSIS — Z992 Dependence on renal dialysis: Secondary | ICD-10-CM | POA: Diagnosis not present

## 2014-12-08 DIAGNOSIS — D509 Iron deficiency anemia, unspecified: Secondary | ICD-10-CM | POA: Diagnosis not present

## 2014-12-08 DIAGNOSIS — N186 End stage renal disease: Secondary | ICD-10-CM | POA: Diagnosis not present

## 2014-12-08 DIAGNOSIS — N2581 Secondary hyperparathyroidism of renal origin: Secondary | ICD-10-CM | POA: Diagnosis not present

## 2014-12-10 DIAGNOSIS — Z992 Dependence on renal dialysis: Secondary | ICD-10-CM | POA: Diagnosis not present

## 2014-12-10 DIAGNOSIS — N2581 Secondary hyperparathyroidism of renal origin: Secondary | ICD-10-CM | POA: Diagnosis not present

## 2014-12-10 DIAGNOSIS — D509 Iron deficiency anemia, unspecified: Secondary | ICD-10-CM | POA: Diagnosis not present

## 2014-12-10 DIAGNOSIS — N186 End stage renal disease: Secondary | ICD-10-CM | POA: Diagnosis not present

## 2014-12-12 DIAGNOSIS — Z992 Dependence on renal dialysis: Secondary | ICD-10-CM | POA: Diagnosis not present

## 2014-12-12 DIAGNOSIS — D509 Iron deficiency anemia, unspecified: Secondary | ICD-10-CM | POA: Diagnosis not present

## 2014-12-12 DIAGNOSIS — N2581 Secondary hyperparathyroidism of renal origin: Secondary | ICD-10-CM | POA: Diagnosis not present

## 2014-12-12 DIAGNOSIS — N186 End stage renal disease: Secondary | ICD-10-CM | POA: Diagnosis not present

## 2014-12-15 DIAGNOSIS — D509 Iron deficiency anemia, unspecified: Secondary | ICD-10-CM | POA: Diagnosis not present

## 2014-12-15 DIAGNOSIS — N186 End stage renal disease: Secondary | ICD-10-CM | POA: Diagnosis not present

## 2014-12-15 DIAGNOSIS — N2581 Secondary hyperparathyroidism of renal origin: Secondary | ICD-10-CM | POA: Diagnosis not present

## 2014-12-15 DIAGNOSIS — Z992 Dependence on renal dialysis: Secondary | ICD-10-CM | POA: Diagnosis not present

## 2014-12-16 DIAGNOSIS — I252 Old myocardial infarction: Secondary | ICD-10-CM | POA: Diagnosis not present

## 2014-12-16 DIAGNOSIS — Z720 Tobacco use: Secondary | ICD-10-CM | POA: Diagnosis not present

## 2014-12-17 DIAGNOSIS — D509 Iron deficiency anemia, unspecified: Secondary | ICD-10-CM | POA: Diagnosis not present

## 2014-12-17 DIAGNOSIS — Z992 Dependence on renal dialysis: Secondary | ICD-10-CM | POA: Diagnosis not present

## 2014-12-17 DIAGNOSIS — N2581 Secondary hyperparathyroidism of renal origin: Secondary | ICD-10-CM | POA: Diagnosis not present

## 2014-12-17 DIAGNOSIS — N186 End stage renal disease: Secondary | ICD-10-CM | POA: Diagnosis not present

## 2014-12-19 DIAGNOSIS — D509 Iron deficiency anemia, unspecified: Secondary | ICD-10-CM | POA: Diagnosis not present

## 2014-12-19 DIAGNOSIS — N186 End stage renal disease: Secondary | ICD-10-CM | POA: Diagnosis not present

## 2014-12-19 DIAGNOSIS — N2581 Secondary hyperparathyroidism of renal origin: Secondary | ICD-10-CM | POA: Diagnosis not present

## 2014-12-19 DIAGNOSIS — Z992 Dependence on renal dialysis: Secondary | ICD-10-CM | POA: Diagnosis not present

## 2014-12-22 DIAGNOSIS — N186 End stage renal disease: Secondary | ICD-10-CM | POA: Diagnosis not present

## 2014-12-22 DIAGNOSIS — N2581 Secondary hyperparathyroidism of renal origin: Secondary | ICD-10-CM | POA: Diagnosis not present

## 2014-12-22 DIAGNOSIS — D509 Iron deficiency anemia, unspecified: Secondary | ICD-10-CM | POA: Diagnosis not present

## 2014-12-22 DIAGNOSIS — Z992 Dependence on renal dialysis: Secondary | ICD-10-CM | POA: Diagnosis not present

## 2014-12-23 DIAGNOSIS — M545 Low back pain: Secondary | ICD-10-CM | POA: Diagnosis not present

## 2014-12-23 DIAGNOSIS — M5441 Lumbago with sciatica, right side: Secondary | ICD-10-CM | POA: Diagnosis not present

## 2014-12-23 DIAGNOSIS — G890 Central pain syndrome: Secondary | ICD-10-CM | POA: Diagnosis not present

## 2014-12-23 DIAGNOSIS — M5442 Lumbago with sciatica, left side: Secondary | ICD-10-CM | POA: Diagnosis not present

## 2014-12-24 DIAGNOSIS — N186 End stage renal disease: Secondary | ICD-10-CM | POA: Diagnosis not present

## 2014-12-24 DIAGNOSIS — D509 Iron deficiency anemia, unspecified: Secondary | ICD-10-CM | POA: Diagnosis not present

## 2014-12-24 DIAGNOSIS — Z992 Dependence on renal dialysis: Secondary | ICD-10-CM | POA: Diagnosis not present

## 2014-12-24 DIAGNOSIS — N2581 Secondary hyperparathyroidism of renal origin: Secondary | ICD-10-CM | POA: Diagnosis not present

## 2014-12-25 DIAGNOSIS — H524 Presbyopia: Secondary | ICD-10-CM | POA: Diagnosis not present

## 2014-12-25 DIAGNOSIS — H52223 Regular astigmatism, bilateral: Secondary | ICD-10-CM | POA: Diagnosis not present

## 2014-12-25 DIAGNOSIS — E10331 Type 1 diabetes mellitus with moderate nonproliferative diabetic retinopathy with macular edema: Secondary | ICD-10-CM | POA: Diagnosis not present

## 2014-12-25 DIAGNOSIS — H5213 Myopia, bilateral: Secondary | ICD-10-CM | POA: Diagnosis not present

## 2014-12-26 DIAGNOSIS — Z992 Dependence on renal dialysis: Secondary | ICD-10-CM | POA: Diagnosis not present

## 2014-12-26 DIAGNOSIS — D509 Iron deficiency anemia, unspecified: Secondary | ICD-10-CM | POA: Diagnosis not present

## 2014-12-26 DIAGNOSIS — N2581 Secondary hyperparathyroidism of renal origin: Secondary | ICD-10-CM | POA: Diagnosis not present

## 2014-12-26 DIAGNOSIS — N186 End stage renal disease: Secondary | ICD-10-CM | POA: Diagnosis not present

## 2014-12-29 DIAGNOSIS — N186 End stage renal disease: Secondary | ICD-10-CM | POA: Diagnosis not present

## 2014-12-29 DIAGNOSIS — N2581 Secondary hyperparathyroidism of renal origin: Secondary | ICD-10-CM | POA: Diagnosis not present

## 2014-12-29 DIAGNOSIS — D509 Iron deficiency anemia, unspecified: Secondary | ICD-10-CM | POA: Diagnosis not present

## 2014-12-29 DIAGNOSIS — Z992 Dependence on renal dialysis: Secondary | ICD-10-CM | POA: Diagnosis not present

## 2014-12-31 DIAGNOSIS — N2581 Secondary hyperparathyroidism of renal origin: Secondary | ICD-10-CM | POA: Diagnosis not present

## 2014-12-31 DIAGNOSIS — D509 Iron deficiency anemia, unspecified: Secondary | ICD-10-CM | POA: Diagnosis not present

## 2014-12-31 DIAGNOSIS — N186 End stage renal disease: Secondary | ICD-10-CM | POA: Diagnosis not present

## 2014-12-31 DIAGNOSIS — Z992 Dependence on renal dialysis: Secondary | ICD-10-CM | POA: Diagnosis not present

## 2015-01-02 DIAGNOSIS — D509 Iron deficiency anemia, unspecified: Secondary | ICD-10-CM | POA: Diagnosis not present

## 2015-01-02 DIAGNOSIS — N186 End stage renal disease: Secondary | ICD-10-CM | POA: Diagnosis not present

## 2015-01-02 DIAGNOSIS — Z992 Dependence on renal dialysis: Secondary | ICD-10-CM | POA: Diagnosis not present

## 2015-01-02 DIAGNOSIS — N2581 Secondary hyperparathyroidism of renal origin: Secondary | ICD-10-CM | POA: Diagnosis not present

## 2015-01-06 DIAGNOSIS — Z992 Dependence on renal dialysis: Secondary | ICD-10-CM | POA: Diagnosis not present

## 2015-01-06 DIAGNOSIS — N2581 Secondary hyperparathyroidism of renal origin: Secondary | ICD-10-CM | POA: Diagnosis not present

## 2015-01-06 DIAGNOSIS — D509 Iron deficiency anemia, unspecified: Secondary | ICD-10-CM | POA: Diagnosis not present

## 2015-01-06 DIAGNOSIS — N186 End stage renal disease: Secondary | ICD-10-CM | POA: Diagnosis not present

## 2015-01-07 DIAGNOSIS — N186 End stage renal disease: Secondary | ICD-10-CM | POA: Diagnosis not present

## 2015-01-07 DIAGNOSIS — D509 Iron deficiency anemia, unspecified: Secondary | ICD-10-CM | POA: Diagnosis not present

## 2015-01-07 DIAGNOSIS — Z992 Dependence on renal dialysis: Secondary | ICD-10-CM | POA: Diagnosis not present

## 2015-01-07 DIAGNOSIS — N2581 Secondary hyperparathyroidism of renal origin: Secondary | ICD-10-CM | POA: Diagnosis not present

## 2015-01-09 DIAGNOSIS — N2581 Secondary hyperparathyroidism of renal origin: Secondary | ICD-10-CM | POA: Diagnosis not present

## 2015-01-09 DIAGNOSIS — Z992 Dependence on renal dialysis: Secondary | ICD-10-CM | POA: Diagnosis not present

## 2015-01-09 DIAGNOSIS — N186 End stage renal disease: Secondary | ICD-10-CM | POA: Diagnosis not present

## 2015-01-09 DIAGNOSIS — D509 Iron deficiency anemia, unspecified: Secondary | ICD-10-CM | POA: Diagnosis not present

## 2015-01-12 DIAGNOSIS — N186 End stage renal disease: Secondary | ICD-10-CM | POA: Diagnosis not present

## 2015-01-12 DIAGNOSIS — D509 Iron deficiency anemia, unspecified: Secondary | ICD-10-CM | POA: Diagnosis not present

## 2015-01-12 DIAGNOSIS — E119 Type 2 diabetes mellitus without complications: Secondary | ICD-10-CM | POA: Diagnosis not present

## 2015-01-12 DIAGNOSIS — Z794 Long term (current) use of insulin: Secondary | ICD-10-CM | POA: Diagnosis not present

## 2015-01-12 DIAGNOSIS — N2581 Secondary hyperparathyroidism of renal origin: Secondary | ICD-10-CM | POA: Diagnosis not present

## 2015-01-12 DIAGNOSIS — Z992 Dependence on renal dialysis: Secondary | ICD-10-CM | POA: Diagnosis not present

## 2015-01-14 DIAGNOSIS — N2581 Secondary hyperparathyroidism of renal origin: Secondary | ICD-10-CM | POA: Diagnosis not present

## 2015-01-14 DIAGNOSIS — D509 Iron deficiency anemia, unspecified: Secondary | ICD-10-CM | POA: Diagnosis not present

## 2015-01-14 DIAGNOSIS — Z992 Dependence on renal dialysis: Secondary | ICD-10-CM | POA: Diagnosis not present

## 2015-01-14 DIAGNOSIS — N186 End stage renal disease: Secondary | ICD-10-CM | POA: Diagnosis not present

## 2015-01-15 DIAGNOSIS — Z992 Dependence on renal dialysis: Secondary | ICD-10-CM | POA: Diagnosis not present

## 2015-01-15 DIAGNOSIS — N186 End stage renal disease: Secondary | ICD-10-CM | POA: Diagnosis not present

## 2015-01-16 DIAGNOSIS — N2581 Secondary hyperparathyroidism of renal origin: Secondary | ICD-10-CM | POA: Diagnosis not present

## 2015-01-16 DIAGNOSIS — N186 End stage renal disease: Secondary | ICD-10-CM | POA: Diagnosis not present

## 2015-01-16 DIAGNOSIS — D509 Iron deficiency anemia, unspecified: Secondary | ICD-10-CM | POA: Diagnosis not present

## 2015-01-16 DIAGNOSIS — Z23 Encounter for immunization: Secondary | ICD-10-CM | POA: Diagnosis not present

## 2015-01-16 DIAGNOSIS — Z992 Dependence on renal dialysis: Secondary | ICD-10-CM | POA: Diagnosis not present

## 2015-01-19 DIAGNOSIS — D509 Iron deficiency anemia, unspecified: Secondary | ICD-10-CM | POA: Diagnosis not present

## 2015-01-19 DIAGNOSIS — Z992 Dependence on renal dialysis: Secondary | ICD-10-CM | POA: Diagnosis not present

## 2015-01-19 DIAGNOSIS — Z23 Encounter for immunization: Secondary | ICD-10-CM | POA: Diagnosis not present

## 2015-01-19 DIAGNOSIS — N2581 Secondary hyperparathyroidism of renal origin: Secondary | ICD-10-CM | POA: Diagnosis not present

## 2015-01-19 DIAGNOSIS — N186 End stage renal disease: Secondary | ICD-10-CM | POA: Diagnosis not present

## 2015-01-20 DIAGNOSIS — Z23 Encounter for immunization: Secondary | ICD-10-CM | POA: Diagnosis not present

## 2015-01-20 DIAGNOSIS — D509 Iron deficiency anemia, unspecified: Secondary | ICD-10-CM | POA: Diagnosis not present

## 2015-01-20 DIAGNOSIS — Z992 Dependence on renal dialysis: Secondary | ICD-10-CM | POA: Diagnosis not present

## 2015-01-20 DIAGNOSIS — N2581 Secondary hyperparathyroidism of renal origin: Secondary | ICD-10-CM | POA: Diagnosis not present

## 2015-01-20 DIAGNOSIS — N186 End stage renal disease: Secondary | ICD-10-CM | POA: Diagnosis not present

## 2015-01-21 DIAGNOSIS — G8929 Other chronic pain: Secondary | ICD-10-CM | POA: Diagnosis not present

## 2015-01-21 DIAGNOSIS — G603 Idiopathic progressive neuropathy: Secondary | ICD-10-CM | POA: Diagnosis not present

## 2015-01-21 DIAGNOSIS — M545 Low back pain: Secondary | ICD-10-CM | POA: Diagnosis not present

## 2015-01-21 DIAGNOSIS — M79652 Pain in left thigh: Secondary | ICD-10-CM | POA: Diagnosis not present

## 2015-01-21 DIAGNOSIS — M79651 Pain in right thigh: Secondary | ICD-10-CM | POA: Diagnosis not present

## 2015-01-21 DIAGNOSIS — G541 Lumbosacral plexus disorders: Secondary | ICD-10-CM | POA: Diagnosis not present

## 2015-01-22 ENCOUNTER — Ambulatory Visit (HOSPITAL_COMMUNITY): Payer: Medicare Other | Attending: Internal Medicine | Admitting: Occupational Therapy

## 2015-01-22 DIAGNOSIS — M6281 Muscle weakness (generalized): Secondary | ICD-10-CM | POA: Insufficient documentation

## 2015-01-22 DIAGNOSIS — G35 Multiple sclerosis: Secondary | ICD-10-CM | POA: Diagnosis not present

## 2015-01-22 DIAGNOSIS — Z7409 Other reduced mobility: Secondary | ICD-10-CM | POA: Diagnosis not present

## 2015-01-22 DIAGNOSIS — Z89442 Acquired absence of left ankle: Secondary | ICD-10-CM | POA: Diagnosis not present

## 2015-01-22 DIAGNOSIS — N186 End stage renal disease: Secondary | ICD-10-CM | POA: Diagnosis not present

## 2015-01-22 DIAGNOSIS — Z89441 Acquired absence of right ankle: Secondary | ICD-10-CM | POA: Insufficient documentation

## 2015-01-22 DIAGNOSIS — E119 Type 2 diabetes mellitus without complications: Secondary | ICD-10-CM | POA: Diagnosis not present

## 2015-01-22 DIAGNOSIS — Z992 Dependence on renal dialysis: Secondary | ICD-10-CM | POA: Diagnosis not present

## 2015-01-22 DIAGNOSIS — I120 Hypertensive chronic kidney disease with stage 5 chronic kidney disease or end stage renal disease: Secondary | ICD-10-CM | POA: Diagnosis not present

## 2015-01-22 NOTE — Therapy (Addendum)
[place], [place], [address] Phone: [phone]   Fax:  [phone]  Occupational Therapy Evaluation  Patient Details  Name: Angela Soto MRN: [id number] Date of Birth: 1970-11-01 Referring Provider:  Neale Burly, MD  Encounter Date: 01/22/2015      OT End of Session - 01/22/15 1616    Visit Number 1   Number of Visits 1   Date for OT Re-Evaluation 03/23/15   Authorization Type medicare   OT Start Time 1355   OT Stop Time 1430   OT Time Calculation (min) 35 min   Activity Tolerance Patient tolerated treatment well   Behavior During Therapy [facility] for tasks assessed/performed      Past Medical History  Diagnosis Date  . Coronary artery disease   . Hypertension   . Myocardial infarction 1998  . Environmental allergies     uses inhalers  . Diabetes mellitus without complication     Type 1  . Umbilical hernia   . Headache(784.0)   . Multiple sclerosis     just diagnosed 2014  . Peripheral vascular disease     aortic artery occlusion  . Chronic kidney disease     End stage renal disease, will be starting dialysis on 10/08/13  . Complication of anesthesia   . PONV (postoperative nausea and vomiting)   . Umbilical hernia   . Nonalcoholic steatohepatitis (NASH)     Past Surgical History  Procedure Laterality Date  . Ble amputation bk    . Abdominal surgery  2006    aortic artery occluded, had to "clean it out"  . Cardiac stents    . Cardiac catheterization    . Coronary angioplasty    . Av fistula placement Left 09/24/2013    Procedure: LEFT UPPER EXTREMITY ARTERIOVENOUS (AV) FISTULA CREATION BRACHIAL/CEPHALIC;  Surgeon: Elam Dutch, MD;  Location: [place];  Service: Vascular;  Laterality: Left;  . Insertion of dialysis catheter Right 10/07/2013    Procedure: INSERTION OF DIALYSIS CATHETER;  Surgeon: Conrad Paoli, MD;  Location: [place];  Service: Vascular;  Laterality: Right;  . Irrigation  and debridement abscess    . Bascilic vein transposition Left 02/18/2014    Procedure: BASILIC VEIN TRANSPOSITION - 2ND STAGE LEFT ARM;  Surgeon: Elam Dutch, MD;  Location: [place];  Service: Vascular;  Laterality: Left;  . Amputation Left 09/22/2014    Procedure: AMPUTATION LEFT FOURTH FINGER;  Surgeon: Dayna Barker, MD;  Location: [place];  Service: Plastics;  Laterality: Left;  . Shuntogram Left 04/17/2014    Procedure: FISTULOGRAM;  Surgeon: Conrad Speers, MD;  Location: [facility] CATH LAB;  Service: Cardiovascular;  Laterality: Left;    There were no vitals filed for this visit.  Visit Diagnosis:  Limited mobility      Subjective Assessment - 01/22/15 1608    Subjective  S:  I want to be able to get around and do things for myself.  The power wheelchair would let me do that.     Patient is accompained by: --  Romilda Garret, seating specialist from [facility]   Pertinent History referred for occupational therapy evaluation for need for power wheelchair.   Patient Stated Goals I want a power wheelchair that has a good cushion and a seat that will be long enough for me.   Currently in Pain? Yes   Pain Location Buttocks   Pain Descriptors / Indicators Constant   Pain Type  Chronic pain   Aggravating Factors  sitting in one position   Pain Relieving Factors repositioning   Effect of Pain on Daily Activities doesnt want to be up in her wheelchair           [facility] OT Assessment - 02-09-2015 0001    Assessment   Diagnosis MS, Bilateral Below Knee Amputee   Onset Date --  several years ago   Precautions   Precautions Fall   Restrictions   Weight Bearing Restrictions No   Balance Screen   Has the patient fallen in the past 6 months No   Has the patient had a decrease in activity level because of a fear of falling?  No   Is the patient reluctant to leave their home because of a fear of falling?  No   Home  Environment   Family/patient expects to be discharged to: Private residence    Living Arrangements Other relatives   Available Help at Discharge Family   Type of [place] Access Level entry   [place] One level  completely wheelchair accessible   Prior Function   Level of Independence Independent with basic ADLs;Needs assistance with homemaking   Vocation Unemployed   ADL   ADL comments Angela Soto is able to complete all transfers and bed mobility independently.  She is able to complete bathing, grooming, dressing at wheelchair level.  She is able to propel her wheelchair with max difficulty.   Written Expression   Dominant Hand Right   Activity Tolerance   Activity Tolerance Tolerates 30 min activity with muliple rests   Sitting Balance Moves/returns trunkal midpoint 1-2 inches in multiple planes   Cognition   Overall Cognitive Status Within Functional Limits for tasks assessed   AROM   Overall AROM Comments BUE AROM is [facility]   Strength   Overall Strength Comments right shoulder strength is 4/5, remaining BUE strength is 5/5                         OT Education - 09-Feb-2015 1615    Education provided Yes   Education Details provided information on quit smart smoking cessation program   Person(s) Educated Patient   Methods Explanation;Handout   Comprehension Verbalized understanding                    Plan - 02/09/15 1616    Clinical Impression Statement A:  Patient presented for wheelchair evaluation this date.    OT Frequency 1x / week   OT Duration --  1 week   Plan DC, one time evaluation   Consulted and Agree with Plan of Care Patient          G-Codes - 02/09/15 1617    Functional Assessment Tool Used clinical judgement ADLs   Functional Limitation Self care;Mobility: Walking and moving around   Mobility: Walking and Moving Around Current Status [phone]) At least 80 percent but less than 100 percent impaired, limited or restricted   Mobility: Walking and Moving Around Goal Status [phone]) At least 80  percent but less than 100 percent impaired, limited or restricted   Mobility: Walking and Moving Around Discharge Status [phone]) At least 80 percent but less than 100 percent impaired, limited or restricted      Problem List Patient Active Problem List   Diagnosis Date Noted  . Infection of hand 09/21/2014  . Finger infection 09/21/2014  . End stage renal disease  on dialysis 11/14/2013  . Tobacco abuse 10/18/2013  . Atherosclerotic peripheral vascular disease 10/18/2013  . Pelvic pain in female 10/18/2013  . Weakness 10/18/2013  . Demyelinating disorder 10/18/2013  . Nausea with vomiting 10/18/2013  . Chronic kidney disease, stage V 09/17/2013  . Demyelinating disease 08/02/2013  . Abdominal pain 07/30/2013  . Renal failure 07/30/2013  . Type II diabetes mellitus with neurological manifestations not at goal 07/30/2013  . HTN (hypertension) 07/30/2013  . Ascites 07/30/2013  . CAD (coronary artery disease) 07/30/2013  01/22/15  Angela Soto is a 44 year old female who has been referred to occupational therapy for assessment for need of a power wheelchair.  Angela Soto has multiple medical conditions, including renal failure, bilateral below knee amputations, multiple sclerosis, diabetes, and history of sacral wounds.  Angela Soto lives in a one story home with a level entrance.  Her home is wheelchair accessible.  She lives with her nephew, who assists her with IADLs.  She is able to transfer independently.  She requires assistance with IADLs.      A FULL PHYSICAL ASSESSMENT REVEALS THE FOLLOWING  Existing Equipment: wheelchair that she has difficulty propelling due to recent digit amputation and activity intolerance, tub bench, raised toilet seat, and a personally purchased used scooter that is not ideally fit for her seating needs. Transfers: independent. Ambulation:  unable due to bilateral below knee amputations. Balance:  WFL static.   Head and Neck: WNL  Trunk: WFL Pelvis: WNL   Hip: AROM is WFL.  Knees: N/A. Feet and Ankles: N/A.  Upper Extremities: BUE AROM is WFL.  BUE strength is 5/5 except for right shoulder flexion and abduction, which are 4/5. Lower Extremities: Hip strength is 5/5, except for extensors, which are 3+/5. Weight Shifting Ability: Independent. Skin Integrity: History of pressure ulcers on sacral area due to extended amounts of time seated in wheelchair. Activity Tolerance:  Poor.   GOALS/OBJECTIVE OF SEATING INTERVENTION  Recommendations:  Angela Soto has functional deficits in the areas of mobility and self care, which are a result of the above listed diagnosis.  Specific functional limitations include: The patient is unable to walk due to bilateral below knee amputations.  She is unable to functionally propel a lightweight or standard manual wheelchair for independent mobility within the home due to shortness of breath, poor sustained activity tolerance, and recent finger amputation. Angela Soto will use the power wheelchair on a full time  basis as her primary means of mobility.    Angela Soto mobility, durability, and positioning requirements make a standard power wheelchair inappropriate for her use.  The recommended wheelchair meets current and future positioning needs, accessibility, durability, and safety requirements for functional use within patients home.  It is the least expensive power wheelchair that meets the patients current and future needs.  Chair specifications are as follows:  HCPCManufacturerMfg's Item #Description [id number] EDGE 2.0 3MP-SSQ6 EDGE 2.0 3MP-SS:  Angela Soto is cognitively aware and physically able to use this chair.   [id number]-22 Battery (x2) [id number] Swing-away Joystick [id number] Tilt & Recline, 300lb cap E0955QuantumSETHEAD1057Comfort Plus Headrest Pad, 10":  These features will allow Angela Soto to pressure relieve and reposition independently, allow for  closer access to ADL surfaces, improve safety with transfers, and improve postural alignment.   [place] [id number] Multipower Option Electronic Connection [id number] TruComfort 2 Back, 4 Way Stretch:  These features allow for greater independence with driving and controling the chair, as well as improve alignment.  E0956QuantumINDPART2663Dartex Curved Thoracic Lateral Pad (  x2) E1028QuantumINDPART2896Swing-away Lateral Mounting Bracket (x2) E2607Comfort CompanyM2-S-1822M2 Cushion w/ Zero Elevation:  Allows for stable surface to decrease pressure and alow for repostiioning.  Improves postural alignment.    If you require any further information concerning Angela Soto's positioning, independence or mobility needs; or any further information why a lesser device will not work, please do not hesitate to contact me at [place]. [address] [place], [place] [address] [phone].   ____________________ __________  Penelope Galas, OTR/L     Date   Vangie Bicker, OTR/L [phone]  01/22/2015, 4:19 PM  [place] [address] [place], [place], [address] Phone: [phone]   Fax:  [phone]

## 2015-01-23 DIAGNOSIS — Z992 Dependence on renal dialysis: Secondary | ICD-10-CM | POA: Diagnosis not present

## 2015-01-23 DIAGNOSIS — N2581 Secondary hyperparathyroidism of renal origin: Secondary | ICD-10-CM | POA: Diagnosis not present

## 2015-01-23 DIAGNOSIS — N186 End stage renal disease: Secondary | ICD-10-CM | POA: Diagnosis not present

## 2015-01-23 DIAGNOSIS — D509 Iron deficiency anemia, unspecified: Secondary | ICD-10-CM | POA: Diagnosis not present

## 2015-01-23 DIAGNOSIS — Z23 Encounter for immunization: Secondary | ICD-10-CM | POA: Diagnosis not present

## 2015-01-26 DIAGNOSIS — D509 Iron deficiency anemia, unspecified: Secondary | ICD-10-CM | POA: Diagnosis not present

## 2015-01-26 DIAGNOSIS — N186 End stage renal disease: Secondary | ICD-10-CM | POA: Diagnosis not present

## 2015-01-26 DIAGNOSIS — Z23 Encounter for immunization: Secondary | ICD-10-CM | POA: Diagnosis not present

## 2015-01-26 DIAGNOSIS — Z992 Dependence on renal dialysis: Secondary | ICD-10-CM | POA: Diagnosis not present

## 2015-01-26 DIAGNOSIS — N2581 Secondary hyperparathyroidism of renal origin: Secondary | ICD-10-CM | POA: Diagnosis not present

## 2015-01-28 DIAGNOSIS — D509 Iron deficiency anemia, unspecified: Secondary | ICD-10-CM | POA: Diagnosis not present

## 2015-01-28 DIAGNOSIS — Z23 Encounter for immunization: Secondary | ICD-10-CM | POA: Diagnosis not present

## 2015-01-28 DIAGNOSIS — N186 End stage renal disease: Secondary | ICD-10-CM | POA: Diagnosis not present

## 2015-01-28 DIAGNOSIS — N2581 Secondary hyperparathyroidism of renal origin: Secondary | ICD-10-CM | POA: Diagnosis not present

## 2015-01-28 DIAGNOSIS — Z992 Dependence on renal dialysis: Secondary | ICD-10-CM | POA: Diagnosis not present

## 2015-01-30 DIAGNOSIS — Z992 Dependence on renal dialysis: Secondary | ICD-10-CM | POA: Diagnosis not present

## 2015-01-30 DIAGNOSIS — N2581 Secondary hyperparathyroidism of renal origin: Secondary | ICD-10-CM | POA: Diagnosis not present

## 2015-01-30 DIAGNOSIS — D509 Iron deficiency anemia, unspecified: Secondary | ICD-10-CM | POA: Diagnosis not present

## 2015-01-30 DIAGNOSIS — N186 End stage renal disease: Secondary | ICD-10-CM | POA: Diagnosis not present

## 2015-01-30 DIAGNOSIS — Z23 Encounter for immunization: Secondary | ICD-10-CM | POA: Diagnosis not present

## 2015-02-02 DIAGNOSIS — Z23 Encounter for immunization: Secondary | ICD-10-CM | POA: Diagnosis not present

## 2015-02-02 DIAGNOSIS — N2581 Secondary hyperparathyroidism of renal origin: Secondary | ICD-10-CM | POA: Diagnosis not present

## 2015-02-02 DIAGNOSIS — D509 Iron deficiency anemia, unspecified: Secondary | ICD-10-CM | POA: Diagnosis not present

## 2015-02-02 DIAGNOSIS — Z992 Dependence on renal dialysis: Secondary | ICD-10-CM | POA: Diagnosis not present

## 2015-02-02 DIAGNOSIS — N186 End stage renal disease: Secondary | ICD-10-CM | POA: Diagnosis not present

## 2015-02-03 DIAGNOSIS — E11359 Type 2 diabetes mellitus with proliferative diabetic retinopathy without macular edema: Secondary | ICD-10-CM | POA: Diagnosis not present

## 2015-02-03 DIAGNOSIS — H211X3 Other vascular disorders of iris and ciliary body, bilateral: Secondary | ICD-10-CM | POA: Diagnosis not present

## 2015-02-04 DIAGNOSIS — Z23 Encounter for immunization: Secondary | ICD-10-CM | POA: Diagnosis not present

## 2015-02-04 DIAGNOSIS — Z992 Dependence on renal dialysis: Secondary | ICD-10-CM | POA: Diagnosis not present

## 2015-02-04 DIAGNOSIS — N186 End stage renal disease: Secondary | ICD-10-CM | POA: Diagnosis not present

## 2015-02-04 DIAGNOSIS — D509 Iron deficiency anemia, unspecified: Secondary | ICD-10-CM | POA: Diagnosis not present

## 2015-02-04 DIAGNOSIS — N2581 Secondary hyperparathyroidism of renal origin: Secondary | ICD-10-CM | POA: Diagnosis not present

## 2015-02-06 DIAGNOSIS — N2581 Secondary hyperparathyroidism of renal origin: Secondary | ICD-10-CM | POA: Diagnosis not present

## 2015-02-06 DIAGNOSIS — N186 End stage renal disease: Secondary | ICD-10-CM | POA: Diagnosis not present

## 2015-02-06 DIAGNOSIS — Z23 Encounter for immunization: Secondary | ICD-10-CM | POA: Diagnosis not present

## 2015-02-06 DIAGNOSIS — D509 Iron deficiency anemia, unspecified: Secondary | ICD-10-CM | POA: Diagnosis not present

## 2015-02-06 DIAGNOSIS — Z992 Dependence on renal dialysis: Secondary | ICD-10-CM | POA: Diagnosis not present

## 2015-02-09 DIAGNOSIS — D509 Iron deficiency anemia, unspecified: Secondary | ICD-10-CM | POA: Diagnosis not present

## 2015-02-09 DIAGNOSIS — Z23 Encounter for immunization: Secondary | ICD-10-CM | POA: Diagnosis not present

## 2015-02-09 DIAGNOSIS — N186 End stage renal disease: Secondary | ICD-10-CM | POA: Diagnosis not present

## 2015-02-09 DIAGNOSIS — Z992 Dependence on renal dialysis: Secondary | ICD-10-CM | POA: Diagnosis not present

## 2015-02-09 DIAGNOSIS — N2581 Secondary hyperparathyroidism of renal origin: Secondary | ICD-10-CM | POA: Diagnosis not present

## 2015-02-10 NOTE — Addendum Note (Signed)
Addended by: Vangie Bicker on: 02/10/2015 12:05 PM   Modules accepted: Orders

## 2015-02-11 DIAGNOSIS — Z992 Dependence on renal dialysis: Secondary | ICD-10-CM | POA: Diagnosis not present

## 2015-02-11 DIAGNOSIS — N2581 Secondary hyperparathyroidism of renal origin: Secondary | ICD-10-CM | POA: Diagnosis not present

## 2015-02-11 DIAGNOSIS — N186 End stage renal disease: Secondary | ICD-10-CM | POA: Diagnosis not present

## 2015-02-11 DIAGNOSIS — D509 Iron deficiency anemia, unspecified: Secondary | ICD-10-CM | POA: Diagnosis not present

## 2015-02-11 DIAGNOSIS — Z23 Encounter for immunization: Secondary | ICD-10-CM | POA: Diagnosis not present

## 2015-02-13 DIAGNOSIS — Z23 Encounter for immunization: Secondary | ICD-10-CM | POA: Diagnosis not present

## 2015-02-13 DIAGNOSIS — Z992 Dependence on renal dialysis: Secondary | ICD-10-CM | POA: Diagnosis not present

## 2015-02-13 DIAGNOSIS — N186 End stage renal disease: Secondary | ICD-10-CM | POA: Diagnosis not present

## 2015-02-13 DIAGNOSIS — D509 Iron deficiency anemia, unspecified: Secondary | ICD-10-CM | POA: Diagnosis not present

## 2015-02-13 DIAGNOSIS — N2581 Secondary hyperparathyroidism of renal origin: Secondary | ICD-10-CM | POA: Diagnosis not present

## 2015-02-14 DIAGNOSIS — Z992 Dependence on renal dialysis: Secondary | ICD-10-CM | POA: Diagnosis not present

## 2015-02-14 DIAGNOSIS — N186 End stage renal disease: Secondary | ICD-10-CM | POA: Diagnosis not present

## 2015-02-16 DIAGNOSIS — Z992 Dependence on renal dialysis: Secondary | ICD-10-CM | POA: Diagnosis not present

## 2015-02-16 DIAGNOSIS — N186 End stage renal disease: Secondary | ICD-10-CM | POA: Diagnosis not present

## 2015-02-16 DIAGNOSIS — D509 Iron deficiency anemia, unspecified: Secondary | ICD-10-CM | POA: Diagnosis not present

## 2015-02-16 DIAGNOSIS — N2581 Secondary hyperparathyroidism of renal origin: Secondary | ICD-10-CM | POA: Diagnosis not present

## 2015-02-16 DIAGNOSIS — R112 Nausea with vomiting, unspecified: Secondary | ICD-10-CM | POA: Diagnosis not present

## 2015-02-18 DIAGNOSIS — N186 End stage renal disease: Secondary | ICD-10-CM | POA: Diagnosis not present

## 2015-02-18 DIAGNOSIS — R112 Nausea with vomiting, unspecified: Secondary | ICD-10-CM | POA: Diagnosis not present

## 2015-02-18 DIAGNOSIS — D509 Iron deficiency anemia, unspecified: Secondary | ICD-10-CM | POA: Diagnosis not present

## 2015-02-18 DIAGNOSIS — Z992 Dependence on renal dialysis: Secondary | ICD-10-CM | POA: Diagnosis not present

## 2015-02-18 DIAGNOSIS — N2581 Secondary hyperparathyroidism of renal origin: Secondary | ICD-10-CM | POA: Diagnosis not present

## 2015-02-20 DIAGNOSIS — M545 Low back pain: Secondary | ICD-10-CM | POA: Diagnosis not present

## 2015-02-20 DIAGNOSIS — G894 Chronic pain syndrome: Secondary | ICD-10-CM | POA: Diagnosis not present

## 2015-02-20 DIAGNOSIS — G8929 Other chronic pain: Secondary | ICD-10-CM | POA: Diagnosis not present

## 2015-02-20 DIAGNOSIS — M79651 Pain in right thigh: Secondary | ICD-10-CM | POA: Diagnosis not present

## 2015-02-20 DIAGNOSIS — Z79899 Other long term (current) drug therapy: Secondary | ICD-10-CM | POA: Diagnosis not present

## 2015-02-20 DIAGNOSIS — M79652 Pain in left thigh: Secondary | ICD-10-CM | POA: Diagnosis not present

## 2015-02-21 DIAGNOSIS — N186 End stage renal disease: Secondary | ICD-10-CM | POA: Diagnosis not present

## 2015-02-21 DIAGNOSIS — N2581 Secondary hyperparathyroidism of renal origin: Secondary | ICD-10-CM | POA: Diagnosis not present

## 2015-02-21 DIAGNOSIS — D509 Iron deficiency anemia, unspecified: Secondary | ICD-10-CM | POA: Diagnosis not present

## 2015-02-21 DIAGNOSIS — R112 Nausea with vomiting, unspecified: Secondary | ICD-10-CM | POA: Diagnosis not present

## 2015-02-21 DIAGNOSIS — Z992 Dependence on renal dialysis: Secondary | ICD-10-CM | POA: Diagnosis not present

## 2015-02-23 DIAGNOSIS — N186 End stage renal disease: Secondary | ICD-10-CM | POA: Diagnosis not present

## 2015-02-23 DIAGNOSIS — Z992 Dependence on renal dialysis: Secondary | ICD-10-CM | POA: Diagnosis not present

## 2015-02-23 DIAGNOSIS — D509 Iron deficiency anemia, unspecified: Secondary | ICD-10-CM | POA: Diagnosis not present

## 2015-02-23 DIAGNOSIS — N2581 Secondary hyperparathyroidism of renal origin: Secondary | ICD-10-CM | POA: Diagnosis not present

## 2015-02-23 DIAGNOSIS — R112 Nausea with vomiting, unspecified: Secondary | ICD-10-CM | POA: Diagnosis not present

## 2015-02-25 DIAGNOSIS — D509 Iron deficiency anemia, unspecified: Secondary | ICD-10-CM | POA: Diagnosis not present

## 2015-02-25 DIAGNOSIS — N186 End stage renal disease: Secondary | ICD-10-CM | POA: Diagnosis not present

## 2015-02-25 DIAGNOSIS — N2581 Secondary hyperparathyroidism of renal origin: Secondary | ICD-10-CM | POA: Diagnosis not present

## 2015-02-25 DIAGNOSIS — R112 Nausea with vomiting, unspecified: Secondary | ICD-10-CM | POA: Diagnosis not present

## 2015-02-25 DIAGNOSIS — Z992 Dependence on renal dialysis: Secondary | ICD-10-CM | POA: Diagnosis not present

## 2015-02-26 DIAGNOSIS — Z89512 Acquired absence of left leg below knee: Secondary | ICD-10-CM | POA: Diagnosis not present

## 2015-02-26 DIAGNOSIS — E1159 Type 2 diabetes mellitus with other circulatory complications: Secondary | ICD-10-CM | POA: Diagnosis not present

## 2015-02-26 DIAGNOSIS — Z89611 Acquired absence of right leg above knee: Secondary | ICD-10-CM | POA: Diagnosis not present

## 2015-02-26 DIAGNOSIS — I10 Essential (primary) hypertension: Secondary | ICD-10-CM | POA: Diagnosis not present

## 2015-02-27 DIAGNOSIS — N186 End stage renal disease: Secondary | ICD-10-CM | POA: Diagnosis not present

## 2015-02-27 DIAGNOSIS — N2581 Secondary hyperparathyroidism of renal origin: Secondary | ICD-10-CM | POA: Diagnosis not present

## 2015-02-27 DIAGNOSIS — R112 Nausea with vomiting, unspecified: Secondary | ICD-10-CM | POA: Diagnosis not present

## 2015-02-27 DIAGNOSIS — D509 Iron deficiency anemia, unspecified: Secondary | ICD-10-CM | POA: Diagnosis not present

## 2015-02-27 DIAGNOSIS — Z992 Dependence on renal dialysis: Secondary | ICD-10-CM | POA: Diagnosis not present

## 2015-03-02 DIAGNOSIS — R112 Nausea with vomiting, unspecified: Secondary | ICD-10-CM | POA: Diagnosis not present

## 2015-03-02 DIAGNOSIS — N186 End stage renal disease: Secondary | ICD-10-CM | POA: Diagnosis not present

## 2015-03-02 DIAGNOSIS — Z992 Dependence on renal dialysis: Secondary | ICD-10-CM | POA: Diagnosis not present

## 2015-03-02 DIAGNOSIS — D509 Iron deficiency anemia, unspecified: Secondary | ICD-10-CM | POA: Diagnosis not present

## 2015-03-02 DIAGNOSIS — N2581 Secondary hyperparathyroidism of renal origin: Secondary | ICD-10-CM | POA: Diagnosis not present

## 2015-03-04 DIAGNOSIS — N2581 Secondary hyperparathyroidism of renal origin: Secondary | ICD-10-CM | POA: Diagnosis not present

## 2015-03-04 DIAGNOSIS — D509 Iron deficiency anemia, unspecified: Secondary | ICD-10-CM | POA: Diagnosis not present

## 2015-03-04 DIAGNOSIS — N186 End stage renal disease: Secondary | ICD-10-CM | POA: Diagnosis not present

## 2015-03-04 DIAGNOSIS — R112 Nausea with vomiting, unspecified: Secondary | ICD-10-CM | POA: Diagnosis not present

## 2015-03-04 DIAGNOSIS — Z992 Dependence on renal dialysis: Secondary | ICD-10-CM | POA: Diagnosis not present

## 2015-03-06 DIAGNOSIS — D509 Iron deficiency anemia, unspecified: Secondary | ICD-10-CM | POA: Diagnosis not present

## 2015-03-06 DIAGNOSIS — N186 End stage renal disease: Secondary | ICD-10-CM | POA: Diagnosis not present

## 2015-03-06 DIAGNOSIS — Z992 Dependence on renal dialysis: Secondary | ICD-10-CM | POA: Diagnosis not present

## 2015-03-06 DIAGNOSIS — R112 Nausea with vomiting, unspecified: Secondary | ICD-10-CM | POA: Diagnosis not present

## 2015-03-06 DIAGNOSIS — N2581 Secondary hyperparathyroidism of renal origin: Secondary | ICD-10-CM | POA: Diagnosis not present

## 2015-03-09 DIAGNOSIS — N2581 Secondary hyperparathyroidism of renal origin: Secondary | ICD-10-CM | POA: Diagnosis not present

## 2015-03-09 DIAGNOSIS — D509 Iron deficiency anemia, unspecified: Secondary | ICD-10-CM | POA: Diagnosis not present

## 2015-03-09 DIAGNOSIS — Z992 Dependence on renal dialysis: Secondary | ICD-10-CM | POA: Diagnosis not present

## 2015-03-09 DIAGNOSIS — N186 End stage renal disease: Secondary | ICD-10-CM | POA: Diagnosis not present

## 2015-03-09 DIAGNOSIS — R112 Nausea with vomiting, unspecified: Secondary | ICD-10-CM | POA: Diagnosis not present

## 2015-03-11 DIAGNOSIS — D509 Iron deficiency anemia, unspecified: Secondary | ICD-10-CM | POA: Diagnosis not present

## 2015-03-11 DIAGNOSIS — N2581 Secondary hyperparathyroidism of renal origin: Secondary | ICD-10-CM | POA: Diagnosis not present

## 2015-03-11 DIAGNOSIS — N186 End stage renal disease: Secondary | ICD-10-CM | POA: Diagnosis not present

## 2015-03-11 DIAGNOSIS — Z992 Dependence on renal dialysis: Secondary | ICD-10-CM | POA: Diagnosis not present

## 2015-03-11 DIAGNOSIS — R112 Nausea with vomiting, unspecified: Secondary | ICD-10-CM | POA: Diagnosis not present

## 2015-03-14 DIAGNOSIS — R112 Nausea with vomiting, unspecified: Secondary | ICD-10-CM | POA: Diagnosis not present

## 2015-03-14 DIAGNOSIS — N186 End stage renal disease: Secondary | ICD-10-CM | POA: Diagnosis not present

## 2015-03-14 DIAGNOSIS — N2581 Secondary hyperparathyroidism of renal origin: Secondary | ICD-10-CM | POA: Diagnosis not present

## 2015-03-14 DIAGNOSIS — D509 Iron deficiency anemia, unspecified: Secondary | ICD-10-CM | POA: Diagnosis not present

## 2015-03-14 DIAGNOSIS — Z992 Dependence on renal dialysis: Secondary | ICD-10-CM | POA: Diagnosis not present

## 2015-03-16 DIAGNOSIS — N2581 Secondary hyperparathyroidism of renal origin: Secondary | ICD-10-CM | POA: Diagnosis not present

## 2015-03-16 DIAGNOSIS — N186 End stage renal disease: Secondary | ICD-10-CM | POA: Diagnosis not present

## 2015-03-16 DIAGNOSIS — Z992 Dependence on renal dialysis: Secondary | ICD-10-CM | POA: Diagnosis not present

## 2015-03-16 DIAGNOSIS — R112 Nausea with vomiting, unspecified: Secondary | ICD-10-CM | POA: Diagnosis not present

## 2015-03-16 DIAGNOSIS — D509 Iron deficiency anemia, unspecified: Secondary | ICD-10-CM | POA: Diagnosis not present

## 2015-03-17 DIAGNOSIS — N186 End stage renal disease: Secondary | ICD-10-CM | POA: Diagnosis not present

## 2015-03-17 DIAGNOSIS — E11359 Type 2 diabetes mellitus with proliferative diabetic retinopathy without macular edema: Secondary | ICD-10-CM | POA: Diagnosis not present

## 2015-03-17 DIAGNOSIS — Z992 Dependence on renal dialysis: Secondary | ICD-10-CM | POA: Diagnosis not present

## 2015-03-18 DIAGNOSIS — D509 Iron deficiency anemia, unspecified: Secondary | ICD-10-CM | POA: Diagnosis not present

## 2015-03-18 DIAGNOSIS — N186 End stage renal disease: Secondary | ICD-10-CM | POA: Diagnosis not present

## 2015-03-18 DIAGNOSIS — N2581 Secondary hyperparathyroidism of renal origin: Secondary | ICD-10-CM | POA: Diagnosis not present

## 2015-03-18 DIAGNOSIS — Z992 Dependence on renal dialysis: Secondary | ICD-10-CM | POA: Diagnosis not present

## 2015-03-20 DIAGNOSIS — N186 End stage renal disease: Secondary | ICD-10-CM | POA: Diagnosis not present

## 2015-03-20 DIAGNOSIS — D509 Iron deficiency anemia, unspecified: Secondary | ICD-10-CM | POA: Diagnosis not present

## 2015-03-20 DIAGNOSIS — N2581 Secondary hyperparathyroidism of renal origin: Secondary | ICD-10-CM | POA: Diagnosis not present

## 2015-03-20 DIAGNOSIS — Z992 Dependence on renal dialysis: Secondary | ICD-10-CM | POA: Diagnosis not present

## 2015-03-23 DIAGNOSIS — M25561 Pain in right knee: Secondary | ICD-10-CM | POA: Diagnosis not present

## 2015-03-23 DIAGNOSIS — M25552 Pain in left hip: Secondary | ICD-10-CM | POA: Diagnosis not present

## 2015-03-23 DIAGNOSIS — G8929 Other chronic pain: Secondary | ICD-10-CM | POA: Diagnosis not present

## 2015-03-23 DIAGNOSIS — M25551 Pain in right hip: Secondary | ICD-10-CM | POA: Diagnosis not present

## 2015-03-23 DIAGNOSIS — M545 Low back pain: Secondary | ICD-10-CM | POA: Diagnosis not present

## 2015-03-23 DIAGNOSIS — M25562 Pain in left knee: Secondary | ICD-10-CM | POA: Diagnosis not present

## 2015-03-24 DIAGNOSIS — N2581 Secondary hyperparathyroidism of renal origin: Secondary | ICD-10-CM | POA: Diagnosis not present

## 2015-03-24 DIAGNOSIS — N186 End stage renal disease: Secondary | ICD-10-CM | POA: Diagnosis not present

## 2015-03-24 DIAGNOSIS — Z992 Dependence on renal dialysis: Secondary | ICD-10-CM | POA: Diagnosis not present

## 2015-03-24 DIAGNOSIS — D509 Iron deficiency anemia, unspecified: Secondary | ICD-10-CM | POA: Diagnosis not present

## 2015-03-25 DIAGNOSIS — N2581 Secondary hyperparathyroidism of renal origin: Secondary | ICD-10-CM | POA: Diagnosis not present

## 2015-03-25 DIAGNOSIS — Z992 Dependence on renal dialysis: Secondary | ICD-10-CM | POA: Diagnosis not present

## 2015-03-25 DIAGNOSIS — D509 Iron deficiency anemia, unspecified: Secondary | ICD-10-CM | POA: Diagnosis not present

## 2015-03-25 DIAGNOSIS — N186 End stage renal disease: Secondary | ICD-10-CM | POA: Diagnosis not present

## 2015-03-27 DIAGNOSIS — Z992 Dependence on renal dialysis: Secondary | ICD-10-CM | POA: Diagnosis not present

## 2015-03-27 DIAGNOSIS — D509 Iron deficiency anemia, unspecified: Secondary | ICD-10-CM | POA: Diagnosis not present

## 2015-03-27 DIAGNOSIS — N186 End stage renal disease: Secondary | ICD-10-CM | POA: Diagnosis not present

## 2015-03-27 DIAGNOSIS — N2581 Secondary hyperparathyroidism of renal origin: Secondary | ICD-10-CM | POA: Diagnosis not present

## 2015-03-30 DIAGNOSIS — N186 End stage renal disease: Secondary | ICD-10-CM | POA: Diagnosis not present

## 2015-03-30 DIAGNOSIS — N2581 Secondary hyperparathyroidism of renal origin: Secondary | ICD-10-CM | POA: Diagnosis not present

## 2015-03-30 DIAGNOSIS — D509 Iron deficiency anemia, unspecified: Secondary | ICD-10-CM | POA: Diagnosis not present

## 2015-03-30 DIAGNOSIS — Z992 Dependence on renal dialysis: Secondary | ICD-10-CM | POA: Diagnosis not present

## 2015-04-01 DIAGNOSIS — Z992 Dependence on renal dialysis: Secondary | ICD-10-CM | POA: Diagnosis not present

## 2015-04-01 DIAGNOSIS — D509 Iron deficiency anemia, unspecified: Secondary | ICD-10-CM | POA: Diagnosis not present

## 2015-04-01 DIAGNOSIS — N2581 Secondary hyperparathyroidism of renal origin: Secondary | ICD-10-CM | POA: Diagnosis not present

## 2015-04-01 DIAGNOSIS — N186 End stage renal disease: Secondary | ICD-10-CM | POA: Diagnosis not present

## 2015-04-03 DIAGNOSIS — N2581 Secondary hyperparathyroidism of renal origin: Secondary | ICD-10-CM | POA: Diagnosis not present

## 2015-04-03 DIAGNOSIS — Z992 Dependence on renal dialysis: Secondary | ICD-10-CM | POA: Diagnosis not present

## 2015-04-03 DIAGNOSIS — D509 Iron deficiency anemia, unspecified: Secondary | ICD-10-CM | POA: Diagnosis not present

## 2015-04-03 DIAGNOSIS — N186 End stage renal disease: Secondary | ICD-10-CM | POA: Diagnosis not present

## 2015-04-05 IMAGING — US US PARACENTESIS
1 series · 10 of 10 positions shown · non-contrast
Comparison: None.

CLINICAL DATA: Abdominal pain, acute on chronic kidney disease,
ascites. Request is made for diagnostic and therapeutic
paracentesis.

EXAM:
ULTRASOUND GUIDED DIAGNOSTIC AND THERAPEUTIC  PARACENTESIS

[Series 1: us paracentesis · 0.31mm/px · 10 of 10 slices shown]
[im 1/10]
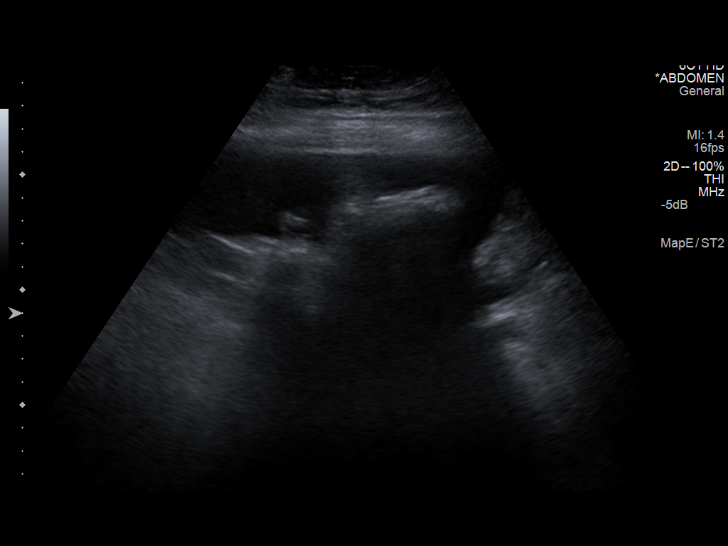
[im 2/10]
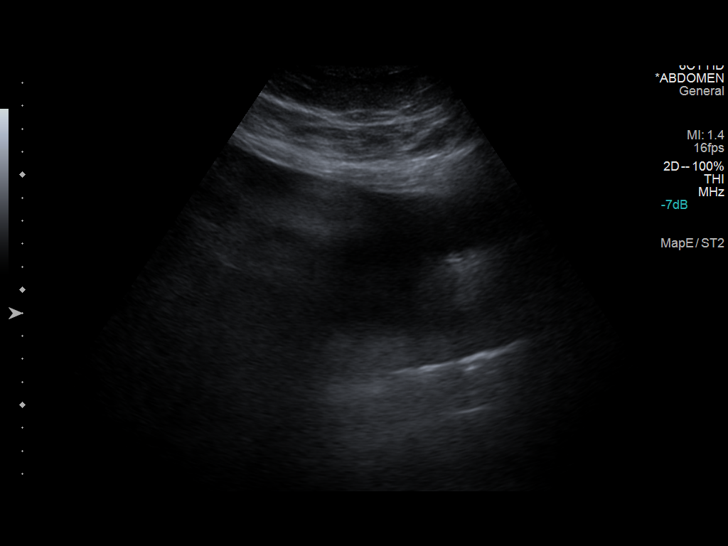
[im 3/10]
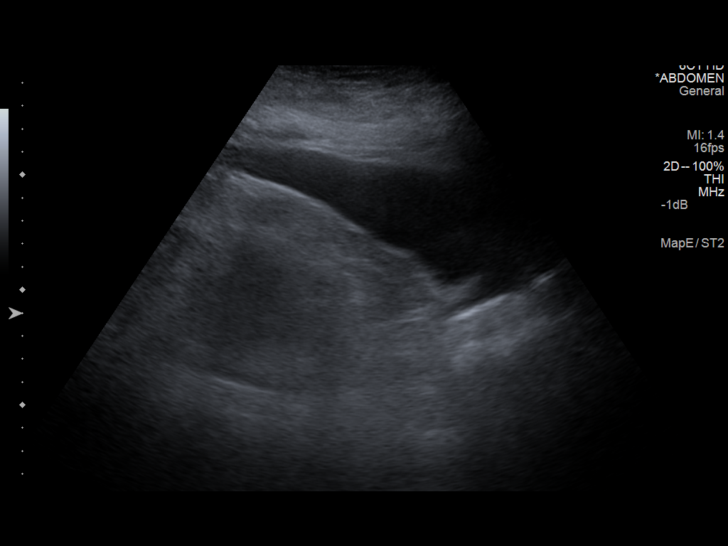
[im 4/10]
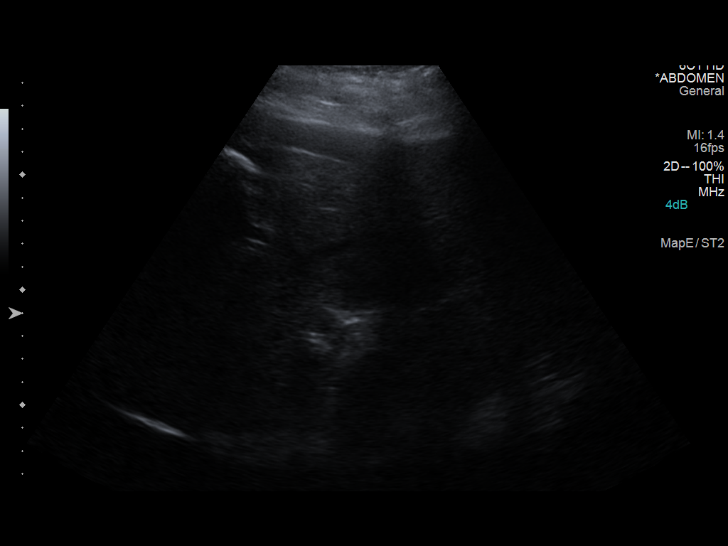
[im 5/10]
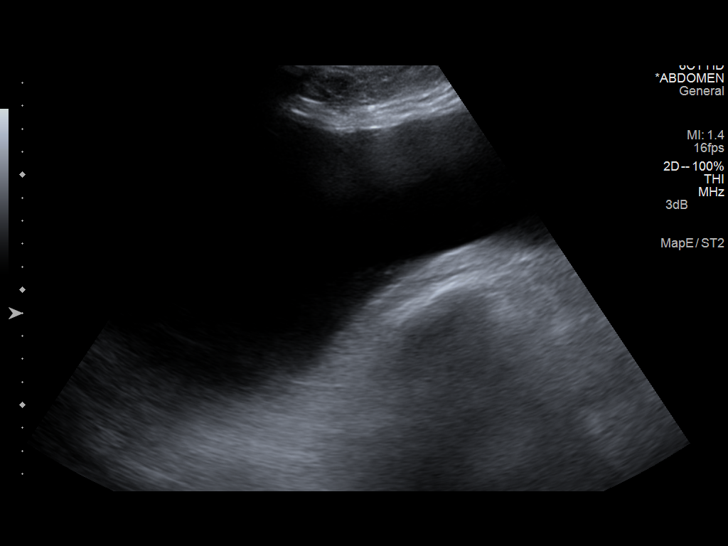
[im 6/10]
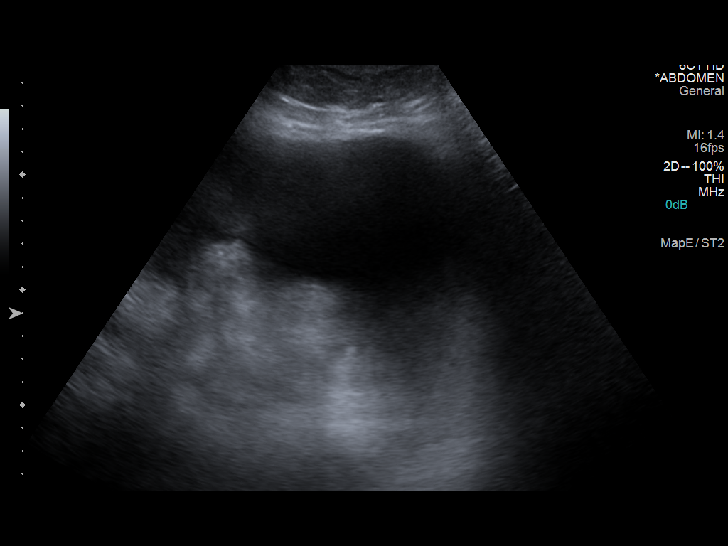
[im 7/10]
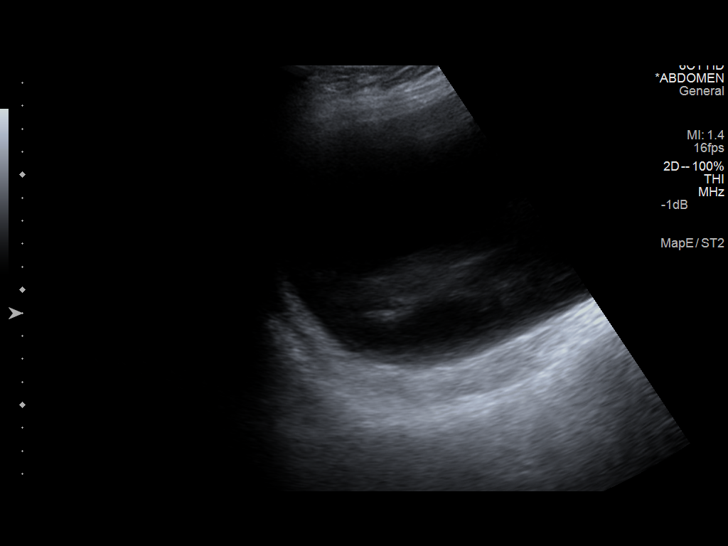
[im 8/10]
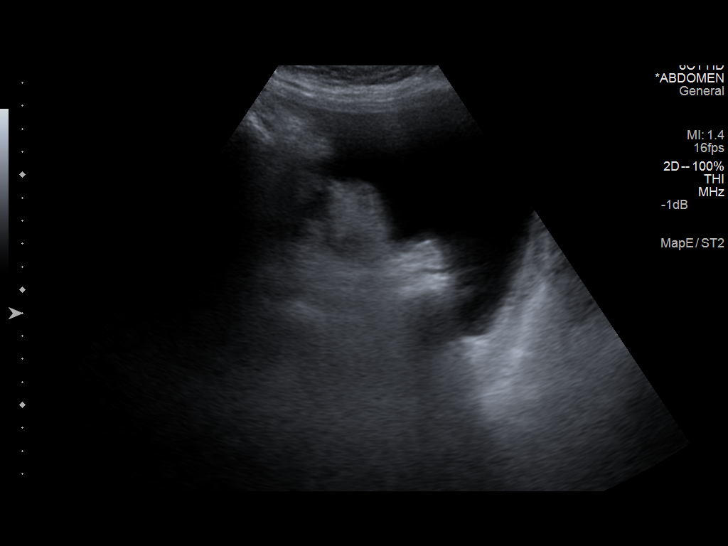
[im 9/10]
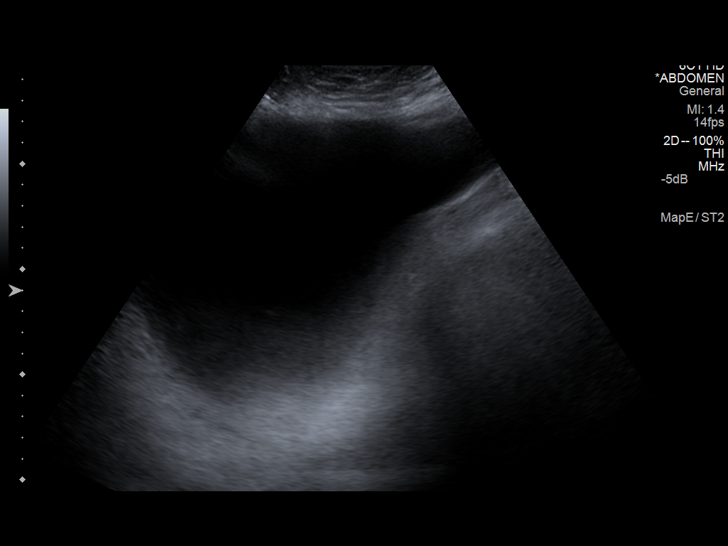
[im 10/10]
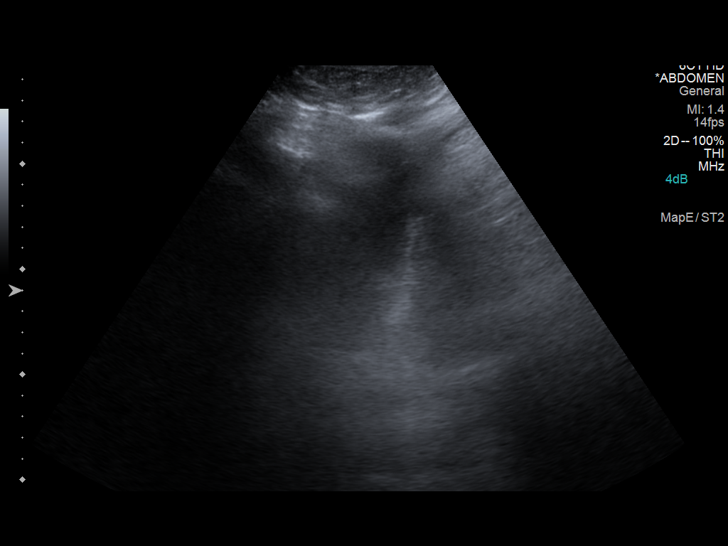

[10 of 10 positions shown; findings below may reference images not displayed]

PROCEDURE:
An ultrasound guided paracentesis was thoroughly discussed with the
patient and questions answered. The benefits, risks, alternatives
and complications were also discussed. The patient understands and
wishes to proceed with the procedure. Written consent was obtained.

Ultrasound was performed to localize and mark an adequate pocket of
fluid in the left lower... quadrant of the abdomen. The area was
then prepped and draped in the normal sterile fashion. 1% Lidocaine
was used for local anesthesia. Under ultrasound guidance a 19 gauge
Yueh catheter was introduced. Paracentesis was performed. The
catheter was removed and a dressing applied.

COMPLICATIONS:
None.
FINDINGS: A total of approximately 4 liters of yellow... fluid was removed. A
fluid sample was sent for laboratory analysis.
IMPRESSION: Successful ultrasound guided diagnostic and therapeutic paracentesis
yielding 4 liters of ascites.

## 2015-04-06 DIAGNOSIS — N2581 Secondary hyperparathyroidism of renal origin: Secondary | ICD-10-CM | POA: Diagnosis not present

## 2015-04-06 DIAGNOSIS — N186 End stage renal disease: Secondary | ICD-10-CM | POA: Diagnosis not present

## 2015-04-06 DIAGNOSIS — Z992 Dependence on renal dialysis: Secondary | ICD-10-CM | POA: Diagnosis not present

## 2015-04-06 DIAGNOSIS — D509 Iron deficiency anemia, unspecified: Secondary | ICD-10-CM | POA: Diagnosis not present

## 2015-04-08 DIAGNOSIS — D509 Iron deficiency anemia, unspecified: Secondary | ICD-10-CM | POA: Diagnosis not present

## 2015-04-08 DIAGNOSIS — N186 End stage renal disease: Secondary | ICD-10-CM | POA: Diagnosis not present

## 2015-04-08 DIAGNOSIS — Z992 Dependence on renal dialysis: Secondary | ICD-10-CM | POA: Diagnosis not present

## 2015-04-08 DIAGNOSIS — N2581 Secondary hyperparathyroidism of renal origin: Secondary | ICD-10-CM | POA: Diagnosis not present

## 2015-04-10 DIAGNOSIS — Z992 Dependence on renal dialysis: Secondary | ICD-10-CM | POA: Diagnosis not present

## 2015-04-10 DIAGNOSIS — N2581 Secondary hyperparathyroidism of renal origin: Secondary | ICD-10-CM | POA: Diagnosis not present

## 2015-04-10 DIAGNOSIS — D509 Iron deficiency anemia, unspecified: Secondary | ICD-10-CM | POA: Diagnosis not present

## 2015-04-10 DIAGNOSIS — N186 End stage renal disease: Secondary | ICD-10-CM | POA: Diagnosis not present

## 2015-04-13 DIAGNOSIS — D509 Iron deficiency anemia, unspecified: Secondary | ICD-10-CM | POA: Diagnosis not present

## 2015-04-13 DIAGNOSIS — N2581 Secondary hyperparathyroidism of renal origin: Secondary | ICD-10-CM | POA: Diagnosis not present

## 2015-04-13 DIAGNOSIS — Z992 Dependence on renal dialysis: Secondary | ICD-10-CM | POA: Diagnosis not present

## 2015-04-13 DIAGNOSIS — Z794 Long term (current) use of insulin: Secondary | ICD-10-CM | POA: Diagnosis not present

## 2015-04-13 DIAGNOSIS — E119 Type 2 diabetes mellitus without complications: Secondary | ICD-10-CM | POA: Diagnosis not present

## 2015-04-13 DIAGNOSIS — N186 End stage renal disease: Secondary | ICD-10-CM | POA: Diagnosis not present

## 2015-04-14 DIAGNOSIS — E11359 Type 2 diabetes mellitus with proliferative diabetic retinopathy without macular edema: Secondary | ICD-10-CM | POA: Diagnosis not present

## 2015-04-15 DIAGNOSIS — Z992 Dependence on renal dialysis: Secondary | ICD-10-CM | POA: Diagnosis not present

## 2015-04-15 DIAGNOSIS — N186 End stage renal disease: Secondary | ICD-10-CM | POA: Diagnosis not present

## 2015-04-15 DIAGNOSIS — N2581 Secondary hyperparathyroidism of renal origin: Secondary | ICD-10-CM | POA: Diagnosis not present

## 2015-04-15 DIAGNOSIS — D509 Iron deficiency anemia, unspecified: Secondary | ICD-10-CM | POA: Diagnosis not present

## 2015-04-16 DIAGNOSIS — Z992 Dependence on renal dialysis: Secondary | ICD-10-CM | POA: Diagnosis not present

## 2015-04-16 DIAGNOSIS — N186 End stage renal disease: Secondary | ICD-10-CM | POA: Diagnosis not present

## 2015-04-17 DIAGNOSIS — N186 End stage renal disease: Secondary | ICD-10-CM | POA: Diagnosis not present

## 2015-04-17 DIAGNOSIS — D509 Iron deficiency anemia, unspecified: Secondary | ICD-10-CM | POA: Diagnosis not present

## 2015-04-17 DIAGNOSIS — Z992 Dependence on renal dialysis: Secondary | ICD-10-CM | POA: Diagnosis not present

## 2015-04-20 DIAGNOSIS — N186 End stage renal disease: Secondary | ICD-10-CM | POA: Diagnosis not present

## 2015-04-20 DIAGNOSIS — Z992 Dependence on renal dialysis: Secondary | ICD-10-CM | POA: Diagnosis not present

## 2015-04-20 DIAGNOSIS — D509 Iron deficiency anemia, unspecified: Secondary | ICD-10-CM | POA: Diagnosis not present

## 2015-04-22 DIAGNOSIS — M79651 Pain in right thigh: Secondary | ICD-10-CM | POA: Diagnosis not present

## 2015-04-22 DIAGNOSIS — M545 Low back pain: Secondary | ICD-10-CM | POA: Diagnosis not present

## 2015-04-22 DIAGNOSIS — M79652 Pain in left thigh: Secondary | ICD-10-CM | POA: Diagnosis not present

## 2015-04-22 DIAGNOSIS — Z79891 Long term (current) use of opiate analgesic: Secondary | ICD-10-CM | POA: Diagnosis not present

## 2015-04-22 DIAGNOSIS — G8929 Other chronic pain: Secondary | ICD-10-CM | POA: Diagnosis not present

## 2015-04-23 DIAGNOSIS — Z992 Dependence on renal dialysis: Secondary | ICD-10-CM | POA: Diagnosis not present

## 2015-04-23 DIAGNOSIS — N186 End stage renal disease: Secondary | ICD-10-CM | POA: Diagnosis not present

## 2015-04-23 DIAGNOSIS — D509 Iron deficiency anemia, unspecified: Secondary | ICD-10-CM | POA: Diagnosis not present

## 2015-04-25 DIAGNOSIS — Z992 Dependence on renal dialysis: Secondary | ICD-10-CM | POA: Diagnosis not present

## 2015-04-25 DIAGNOSIS — D509 Iron deficiency anemia, unspecified: Secondary | ICD-10-CM | POA: Diagnosis not present

## 2015-04-25 DIAGNOSIS — N186 End stage renal disease: Secondary | ICD-10-CM | POA: Diagnosis not present

## 2015-04-28 DIAGNOSIS — D509 Iron deficiency anemia, unspecified: Secondary | ICD-10-CM | POA: Diagnosis not present

## 2015-04-28 DIAGNOSIS — N186 End stage renal disease: Secondary | ICD-10-CM | POA: Diagnosis not present

## 2015-04-28 DIAGNOSIS — Z992 Dependence on renal dialysis: Secondary | ICD-10-CM | POA: Diagnosis not present

## 2015-04-30 DIAGNOSIS — N186 End stage renal disease: Secondary | ICD-10-CM | POA: Diagnosis not present

## 2015-04-30 DIAGNOSIS — D509 Iron deficiency anemia, unspecified: Secondary | ICD-10-CM | POA: Diagnosis not present

## 2015-04-30 DIAGNOSIS — Z992 Dependence on renal dialysis: Secondary | ICD-10-CM | POA: Diagnosis not present

## 2015-05-02 DIAGNOSIS — D509 Iron deficiency anemia, unspecified: Secondary | ICD-10-CM | POA: Diagnosis not present

## 2015-05-02 DIAGNOSIS — Z992 Dependence on renal dialysis: Secondary | ICD-10-CM | POA: Diagnosis not present

## 2015-05-02 DIAGNOSIS — N186 End stage renal disease: Secondary | ICD-10-CM | POA: Diagnosis not present

## 2015-05-05 DIAGNOSIS — Z992 Dependence on renal dialysis: Secondary | ICD-10-CM | POA: Diagnosis not present

## 2015-05-05 DIAGNOSIS — N186 End stage renal disease: Secondary | ICD-10-CM | POA: Diagnosis not present

## 2015-05-05 DIAGNOSIS — D509 Iron deficiency anemia, unspecified: Secondary | ICD-10-CM | POA: Diagnosis not present

## 2015-05-06 DIAGNOSIS — N186 End stage renal disease: Secondary | ICD-10-CM | POA: Diagnosis not present

## 2015-05-06 DIAGNOSIS — D509 Iron deficiency anemia, unspecified: Secondary | ICD-10-CM | POA: Diagnosis not present

## 2015-05-06 DIAGNOSIS — Z992 Dependence on renal dialysis: Secondary | ICD-10-CM | POA: Diagnosis not present

## 2015-05-08 DIAGNOSIS — Z992 Dependence on renal dialysis: Secondary | ICD-10-CM | POA: Diagnosis not present

## 2015-05-08 DIAGNOSIS — D509 Iron deficiency anemia, unspecified: Secondary | ICD-10-CM | POA: Diagnosis not present

## 2015-05-08 DIAGNOSIS — N186 End stage renal disease: Secondary | ICD-10-CM | POA: Diagnosis not present

## 2015-05-11 DIAGNOSIS — N186 End stage renal disease: Secondary | ICD-10-CM | POA: Diagnosis not present

## 2015-05-11 DIAGNOSIS — D509 Iron deficiency anemia, unspecified: Secondary | ICD-10-CM | POA: Diagnosis not present

## 2015-05-11 DIAGNOSIS — Z992 Dependence on renal dialysis: Secondary | ICD-10-CM | POA: Diagnosis not present

## 2015-05-13 DIAGNOSIS — E11359 Type 2 diabetes mellitus with proliferative diabetic retinopathy without macular edema: Secondary | ICD-10-CM | POA: Diagnosis not present

## 2015-05-14 DIAGNOSIS — D509 Iron deficiency anemia, unspecified: Secondary | ICD-10-CM | POA: Diagnosis not present

## 2015-05-14 DIAGNOSIS — N186 End stage renal disease: Secondary | ICD-10-CM | POA: Diagnosis not present

## 2015-05-14 DIAGNOSIS — Z992 Dependence on renal dialysis: Secondary | ICD-10-CM | POA: Diagnosis not present

## 2015-05-15 DIAGNOSIS — D509 Iron deficiency anemia, unspecified: Secondary | ICD-10-CM | POA: Diagnosis not present

## 2015-05-15 DIAGNOSIS — N186 End stage renal disease: Secondary | ICD-10-CM | POA: Diagnosis not present

## 2015-05-15 DIAGNOSIS — Z992 Dependence on renal dialysis: Secondary | ICD-10-CM | POA: Diagnosis not present

## 2015-05-17 DIAGNOSIS — Z992 Dependence on renal dialysis: Secondary | ICD-10-CM | POA: Diagnosis not present

## 2015-05-17 DIAGNOSIS — N186 End stage renal disease: Secondary | ICD-10-CM | POA: Diagnosis not present

## 2015-05-18 DIAGNOSIS — D509 Iron deficiency anemia, unspecified: Secondary | ICD-10-CM | POA: Diagnosis not present

## 2015-05-18 DIAGNOSIS — N186 End stage renal disease: Secondary | ICD-10-CM | POA: Diagnosis not present

## 2015-05-18 DIAGNOSIS — R110 Nausea: Secondary | ICD-10-CM | POA: Diagnosis not present

## 2015-05-18 DIAGNOSIS — Z992 Dependence on renal dialysis: Secondary | ICD-10-CM | POA: Diagnosis not present

## 2015-05-20 DIAGNOSIS — Z992 Dependence on renal dialysis: Secondary | ICD-10-CM | POA: Diagnosis not present

## 2015-05-20 DIAGNOSIS — D509 Iron deficiency anemia, unspecified: Secondary | ICD-10-CM | POA: Diagnosis not present

## 2015-05-20 DIAGNOSIS — N186 End stage renal disease: Secondary | ICD-10-CM | POA: Diagnosis not present

## 2015-05-20 DIAGNOSIS — R110 Nausea: Secondary | ICD-10-CM | POA: Diagnosis not present

## 2015-05-21 DIAGNOSIS — G8929 Other chronic pain: Secondary | ICD-10-CM | POA: Diagnosis not present

## 2015-05-21 DIAGNOSIS — M545 Low back pain: Secondary | ICD-10-CM | POA: Diagnosis not present

## 2015-05-22 DIAGNOSIS — D509 Iron deficiency anemia, unspecified: Secondary | ICD-10-CM | POA: Diagnosis not present

## 2015-05-22 DIAGNOSIS — Z992 Dependence on renal dialysis: Secondary | ICD-10-CM | POA: Diagnosis not present

## 2015-05-22 DIAGNOSIS — R110 Nausea: Secondary | ICD-10-CM | POA: Diagnosis not present

## 2015-05-22 DIAGNOSIS — N186 End stage renal disease: Secondary | ICD-10-CM | POA: Diagnosis not present

## 2015-05-25 DIAGNOSIS — D509 Iron deficiency anemia, unspecified: Secondary | ICD-10-CM | POA: Diagnosis not present

## 2015-05-25 DIAGNOSIS — N186 End stage renal disease: Secondary | ICD-10-CM | POA: Diagnosis not present

## 2015-05-25 DIAGNOSIS — Z992 Dependence on renal dialysis: Secondary | ICD-10-CM | POA: Diagnosis not present

## 2015-05-25 DIAGNOSIS — R110 Nausea: Secondary | ICD-10-CM | POA: Diagnosis not present

## 2015-05-27 DIAGNOSIS — R110 Nausea: Secondary | ICD-10-CM | POA: Diagnosis not present

## 2015-05-27 DIAGNOSIS — N186 End stage renal disease: Secondary | ICD-10-CM | POA: Diagnosis not present

## 2015-05-27 DIAGNOSIS — Z992 Dependence on renal dialysis: Secondary | ICD-10-CM | POA: Diagnosis not present

## 2015-05-27 DIAGNOSIS — D509 Iron deficiency anemia, unspecified: Secondary | ICD-10-CM | POA: Diagnosis not present

## 2015-05-29 DIAGNOSIS — R110 Nausea: Secondary | ICD-10-CM | POA: Diagnosis not present

## 2015-05-29 DIAGNOSIS — D509 Iron deficiency anemia, unspecified: Secondary | ICD-10-CM | POA: Diagnosis not present

## 2015-05-29 DIAGNOSIS — N186 End stage renal disease: Secondary | ICD-10-CM | POA: Diagnosis not present

## 2015-05-29 DIAGNOSIS — Z992 Dependence on renal dialysis: Secondary | ICD-10-CM | POA: Diagnosis not present

## 2015-05-31 IMAGING — CR DG CHEST 2V
2 series · 2 of 2 positions shown · non-contrast
Comparison: None.

CLINICAL DATA: Preoperative arteriovenous fistula creation

EXAM:
CHEST  2 VIEW

[w chest pa]
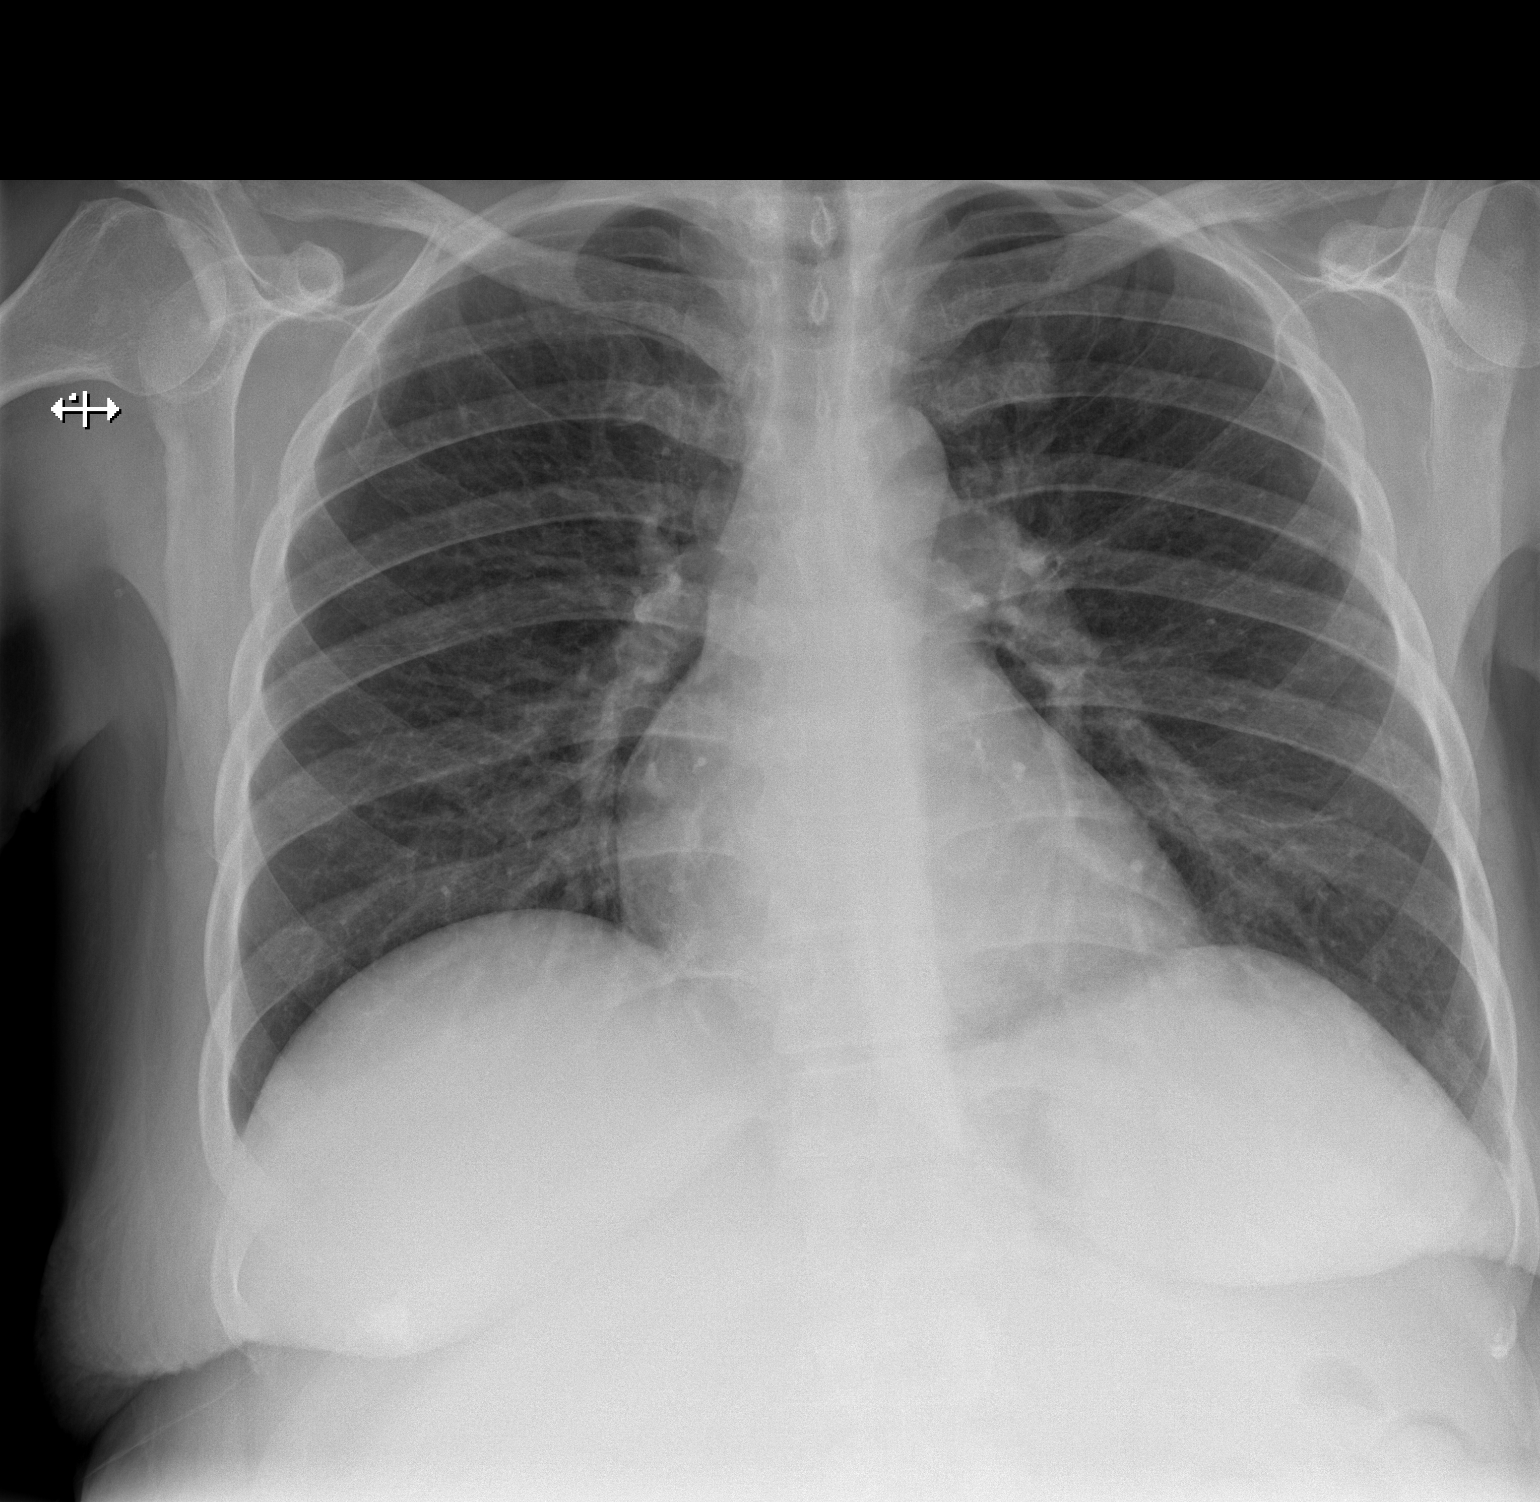

[w chest lat]
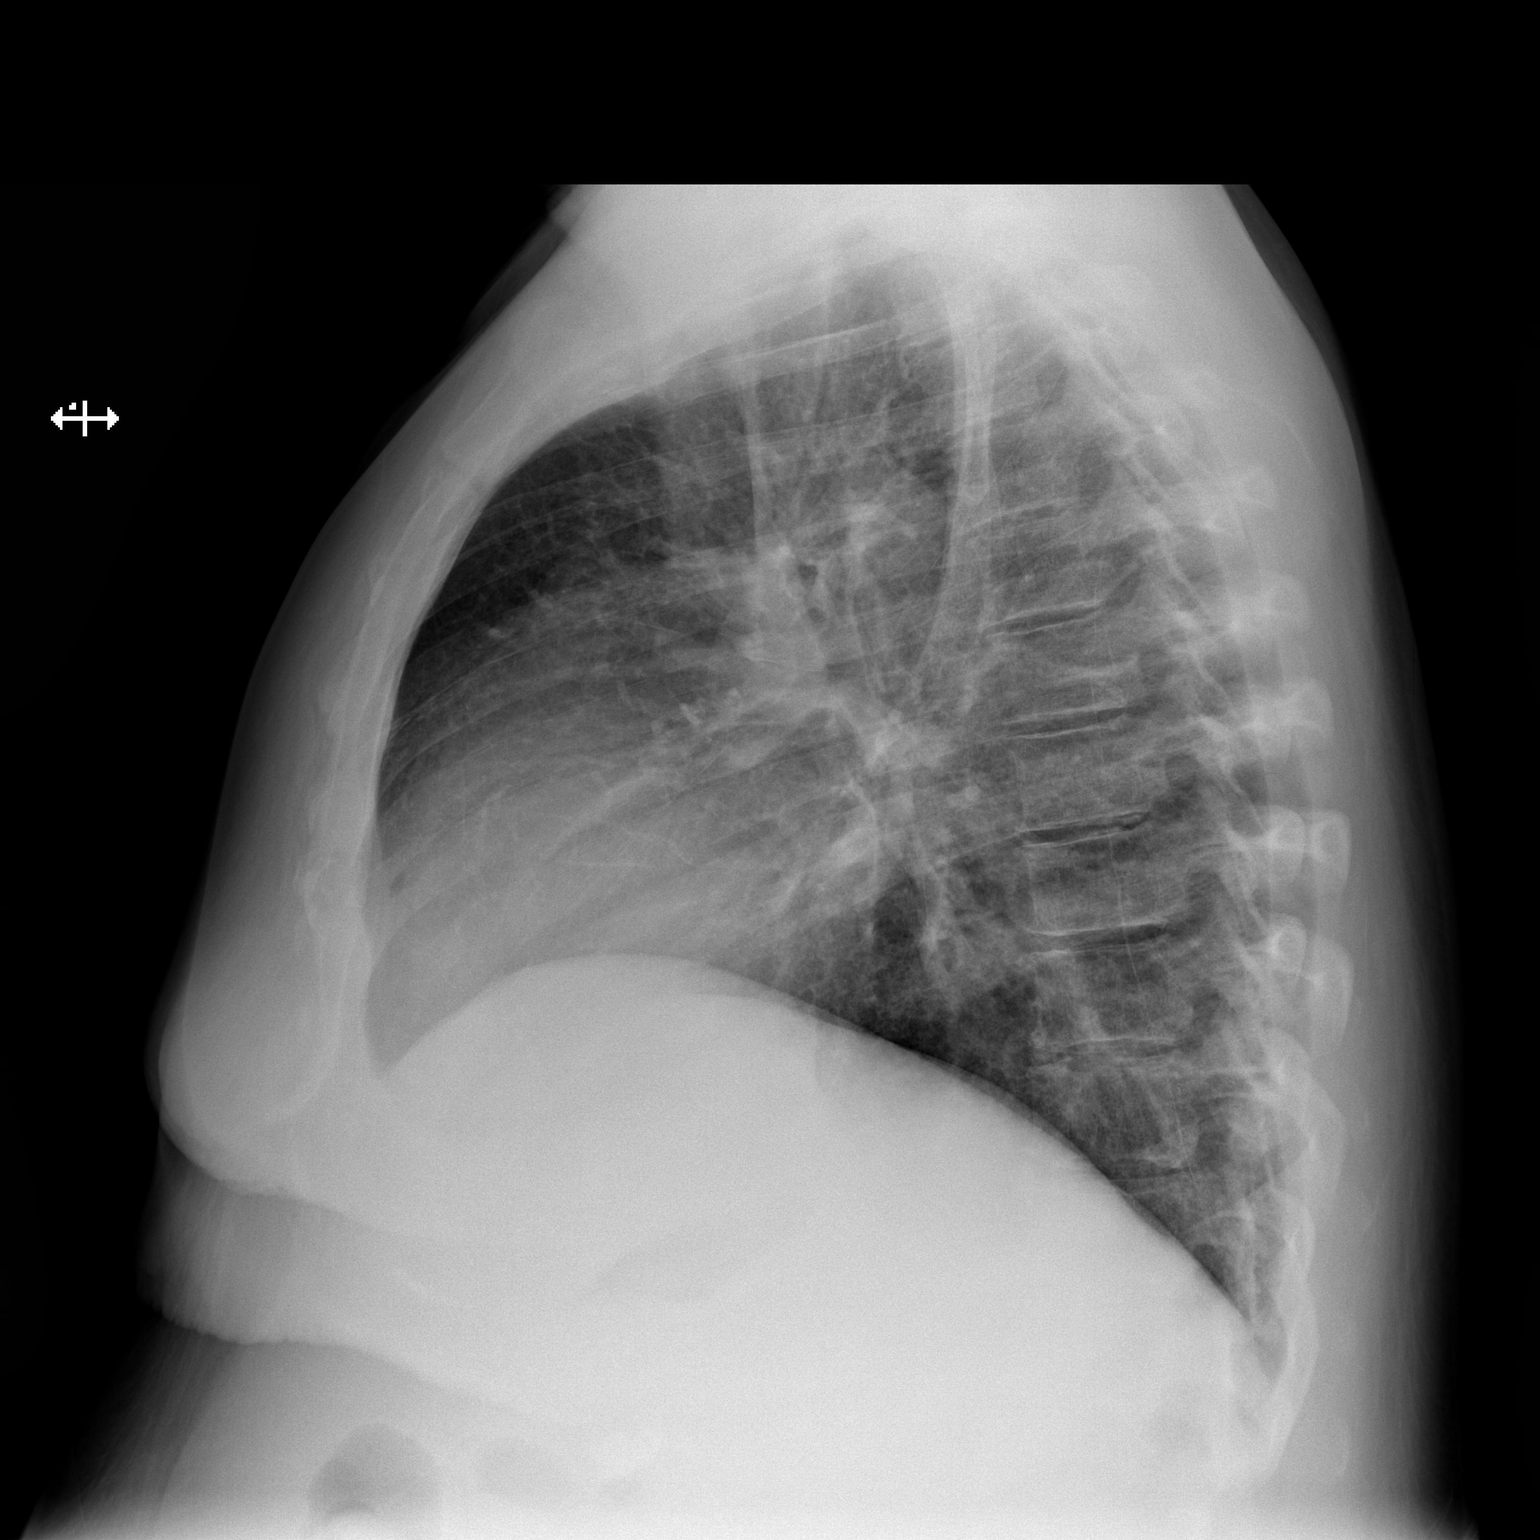

[2 of 2 positions shown; findings below may reference images not displayed]

FINDINGS: There is minimal scarring in the left base. The lungs are otherwise
clear. Heart size and pulmonary vascularity are normal. No
adenopathy. No bone lesions.
IMPRESSION: No edema or consolidation.

## 2015-06-02 DIAGNOSIS — R110 Nausea: Secondary | ICD-10-CM | POA: Diagnosis not present

## 2015-06-02 DIAGNOSIS — N186 End stage renal disease: Secondary | ICD-10-CM | POA: Diagnosis not present

## 2015-06-02 DIAGNOSIS — Z992 Dependence on renal dialysis: Secondary | ICD-10-CM | POA: Diagnosis not present

## 2015-06-02 DIAGNOSIS — D509 Iron deficiency anemia, unspecified: Secondary | ICD-10-CM | POA: Diagnosis not present

## 2015-06-03 DIAGNOSIS — N186 End stage renal disease: Secondary | ICD-10-CM | POA: Diagnosis not present

## 2015-06-03 DIAGNOSIS — R110 Nausea: Secondary | ICD-10-CM | POA: Diagnosis not present

## 2015-06-03 DIAGNOSIS — Z992 Dependence on renal dialysis: Secondary | ICD-10-CM | POA: Diagnosis not present

## 2015-06-03 DIAGNOSIS — D509 Iron deficiency anemia, unspecified: Secondary | ICD-10-CM | POA: Diagnosis not present

## 2015-06-04 DIAGNOSIS — L03317 Cellulitis of buttock: Secondary | ICD-10-CM | POA: Diagnosis not present

## 2015-06-04 DIAGNOSIS — I10 Essential (primary) hypertension: Secondary | ICD-10-CM | POA: Diagnosis not present

## 2015-06-04 DIAGNOSIS — Z89611 Acquired absence of right leg above knee: Secondary | ICD-10-CM | POA: Diagnosis not present

## 2015-06-04 DIAGNOSIS — E0789 Other specified disorders of thyroid: Secondary | ICD-10-CM | POA: Diagnosis not present

## 2015-06-04 DIAGNOSIS — Z89512 Acquired absence of left leg below knee: Secondary | ICD-10-CM | POA: Diagnosis not present

## 2015-06-04 DIAGNOSIS — E1159 Type 2 diabetes mellitus with other circulatory complications: Secondary | ICD-10-CM | POA: Diagnosis not present

## 2015-06-05 DIAGNOSIS — D509 Iron deficiency anemia, unspecified: Secondary | ICD-10-CM | POA: Diagnosis not present

## 2015-06-05 DIAGNOSIS — Z992 Dependence on renal dialysis: Secondary | ICD-10-CM | POA: Diagnosis not present

## 2015-06-05 DIAGNOSIS — R110 Nausea: Secondary | ICD-10-CM | POA: Diagnosis not present

## 2015-06-05 DIAGNOSIS — N186 End stage renal disease: Secondary | ICD-10-CM | POA: Diagnosis not present

## 2015-06-08 DIAGNOSIS — R110 Nausea: Secondary | ICD-10-CM | POA: Diagnosis not present

## 2015-06-08 DIAGNOSIS — N186 End stage renal disease: Secondary | ICD-10-CM | POA: Diagnosis not present

## 2015-06-08 DIAGNOSIS — Z992 Dependence on renal dialysis: Secondary | ICD-10-CM | POA: Diagnosis not present

## 2015-06-08 DIAGNOSIS — D509 Iron deficiency anemia, unspecified: Secondary | ICD-10-CM | POA: Diagnosis not present

## 2015-06-09 DIAGNOSIS — E042 Nontoxic multinodular goiter: Secondary | ICD-10-CM | POA: Diagnosis not present

## 2015-06-10 DIAGNOSIS — R110 Nausea: Secondary | ICD-10-CM | POA: Diagnosis not present

## 2015-06-10 DIAGNOSIS — Z992 Dependence on renal dialysis: Secondary | ICD-10-CM | POA: Diagnosis not present

## 2015-06-10 DIAGNOSIS — N186 End stage renal disease: Secondary | ICD-10-CM | POA: Diagnosis not present

## 2015-06-10 DIAGNOSIS — D509 Iron deficiency anemia, unspecified: Secondary | ICD-10-CM | POA: Diagnosis not present

## 2015-06-11 DIAGNOSIS — E11359 Type 2 diabetes mellitus with proliferative diabetic retinopathy without macular edema: Secondary | ICD-10-CM | POA: Diagnosis not present

## 2015-06-12 DIAGNOSIS — R110 Nausea: Secondary | ICD-10-CM | POA: Diagnosis not present

## 2015-06-12 DIAGNOSIS — Z992 Dependence on renal dialysis: Secondary | ICD-10-CM | POA: Diagnosis not present

## 2015-06-12 DIAGNOSIS — D509 Iron deficiency anemia, unspecified: Secondary | ICD-10-CM | POA: Diagnosis not present

## 2015-06-12 DIAGNOSIS — N186 End stage renal disease: Secondary | ICD-10-CM | POA: Diagnosis not present

## 2015-06-15 DIAGNOSIS — R110 Nausea: Secondary | ICD-10-CM | POA: Diagnosis not present

## 2015-06-15 DIAGNOSIS — D509 Iron deficiency anemia, unspecified: Secondary | ICD-10-CM | POA: Diagnosis not present

## 2015-06-15 DIAGNOSIS — N186 End stage renal disease: Secondary | ICD-10-CM | POA: Diagnosis not present

## 2015-06-15 DIAGNOSIS — Z992 Dependence on renal dialysis: Secondary | ICD-10-CM | POA: Diagnosis not present

## 2015-06-17 DIAGNOSIS — N186 End stage renal disease: Secondary | ICD-10-CM | POA: Diagnosis not present

## 2015-06-17 DIAGNOSIS — Z992 Dependence on renal dialysis: Secondary | ICD-10-CM | POA: Diagnosis not present

## 2015-06-17 DIAGNOSIS — R110 Nausea: Secondary | ICD-10-CM | POA: Diagnosis not present

## 2015-06-17 DIAGNOSIS — D509 Iron deficiency anemia, unspecified: Secondary | ICD-10-CM | POA: Diagnosis not present

## 2015-06-19 DIAGNOSIS — Z23 Encounter for immunization: Secondary | ICD-10-CM | POA: Diagnosis not present

## 2015-06-19 DIAGNOSIS — Z992 Dependence on renal dialysis: Secondary | ICD-10-CM | POA: Diagnosis not present

## 2015-06-19 DIAGNOSIS — N2581 Secondary hyperparathyroidism of renal origin: Secondary | ICD-10-CM | POA: Diagnosis not present

## 2015-06-19 DIAGNOSIS — N186 End stage renal disease: Secondary | ICD-10-CM | POA: Diagnosis not present

## 2015-06-19 DIAGNOSIS — R110 Nausea: Secondary | ICD-10-CM | POA: Diagnosis not present

## 2015-06-19 DIAGNOSIS — D509 Iron deficiency anemia, unspecified: Secondary | ICD-10-CM | POA: Diagnosis not present

## 2015-06-23 DIAGNOSIS — D509 Iron deficiency anemia, unspecified: Secondary | ICD-10-CM | POA: Diagnosis not present

## 2015-06-23 DIAGNOSIS — R110 Nausea: Secondary | ICD-10-CM | POA: Diagnosis not present

## 2015-06-23 DIAGNOSIS — Z23 Encounter for immunization: Secondary | ICD-10-CM | POA: Diagnosis not present

## 2015-06-23 DIAGNOSIS — N186 End stage renal disease: Secondary | ICD-10-CM | POA: Diagnosis not present

## 2015-06-23 DIAGNOSIS — N2581 Secondary hyperparathyroidism of renal origin: Secondary | ICD-10-CM | POA: Diagnosis not present

## 2015-06-23 DIAGNOSIS — Z992 Dependence on renal dialysis: Secondary | ICD-10-CM | POA: Diagnosis not present

## 2015-06-24 DIAGNOSIS — R110 Nausea: Secondary | ICD-10-CM | POA: Diagnosis not present

## 2015-06-24 DIAGNOSIS — Z992 Dependence on renal dialysis: Secondary | ICD-10-CM | POA: Diagnosis not present

## 2015-06-24 DIAGNOSIS — D509 Iron deficiency anemia, unspecified: Secondary | ICD-10-CM | POA: Diagnosis not present

## 2015-06-24 DIAGNOSIS — N186 End stage renal disease: Secondary | ICD-10-CM | POA: Diagnosis not present

## 2015-06-24 DIAGNOSIS — N2581 Secondary hyperparathyroidism of renal origin: Secondary | ICD-10-CM | POA: Diagnosis not present

## 2015-06-24 DIAGNOSIS — Z23 Encounter for immunization: Secondary | ICD-10-CM | POA: Diagnosis not present

## 2015-06-26 DIAGNOSIS — M79651 Pain in right thigh: Secondary | ICD-10-CM | POA: Diagnosis not present

## 2015-06-26 DIAGNOSIS — M545 Low back pain: Secondary | ICD-10-CM | POA: Diagnosis not present

## 2015-06-26 DIAGNOSIS — Z79891 Long term (current) use of opiate analgesic: Secondary | ICD-10-CM | POA: Diagnosis not present

## 2015-06-26 DIAGNOSIS — M79652 Pain in left thigh: Secondary | ICD-10-CM | POA: Diagnosis not present

## 2015-06-26 DIAGNOSIS — G8929 Other chronic pain: Secondary | ICD-10-CM | POA: Diagnosis not present

## 2015-06-27 DIAGNOSIS — N186 End stage renal disease: Secondary | ICD-10-CM | POA: Diagnosis not present

## 2015-06-27 DIAGNOSIS — D509 Iron deficiency anemia, unspecified: Secondary | ICD-10-CM | POA: Diagnosis not present

## 2015-06-27 DIAGNOSIS — Z992 Dependence on renal dialysis: Secondary | ICD-10-CM | POA: Diagnosis not present

## 2015-06-27 DIAGNOSIS — R110 Nausea: Secondary | ICD-10-CM | POA: Diagnosis not present

## 2015-06-27 DIAGNOSIS — Z23 Encounter for immunization: Secondary | ICD-10-CM | POA: Diagnosis not present

## 2015-06-27 DIAGNOSIS — N2581 Secondary hyperparathyroidism of renal origin: Secondary | ICD-10-CM | POA: Diagnosis not present

## 2015-06-29 DIAGNOSIS — Z992 Dependence on renal dialysis: Secondary | ICD-10-CM | POA: Diagnosis not present

## 2015-06-29 DIAGNOSIS — D509 Iron deficiency anemia, unspecified: Secondary | ICD-10-CM | POA: Diagnosis not present

## 2015-06-29 DIAGNOSIS — Z23 Encounter for immunization: Secondary | ICD-10-CM | POA: Diagnosis not present

## 2015-06-29 DIAGNOSIS — N2581 Secondary hyperparathyroidism of renal origin: Secondary | ICD-10-CM | POA: Diagnosis not present

## 2015-06-29 DIAGNOSIS — N186 End stage renal disease: Secondary | ICD-10-CM | POA: Diagnosis not present

## 2015-06-29 DIAGNOSIS — R110 Nausea: Secondary | ICD-10-CM | POA: Diagnosis not present

## 2015-07-01 DIAGNOSIS — N2581 Secondary hyperparathyroidism of renal origin: Secondary | ICD-10-CM | POA: Diagnosis not present

## 2015-07-01 DIAGNOSIS — Z992 Dependence on renal dialysis: Secondary | ICD-10-CM | POA: Diagnosis not present

## 2015-07-01 DIAGNOSIS — D509 Iron deficiency anemia, unspecified: Secondary | ICD-10-CM | POA: Diagnosis not present

## 2015-07-01 DIAGNOSIS — Z23 Encounter for immunization: Secondary | ICD-10-CM | POA: Diagnosis not present

## 2015-07-01 DIAGNOSIS — R110 Nausea: Secondary | ICD-10-CM | POA: Diagnosis not present

## 2015-07-01 DIAGNOSIS — N186 End stage renal disease: Secondary | ICD-10-CM | POA: Diagnosis not present

## 2015-07-03 DIAGNOSIS — N186 End stage renal disease: Secondary | ICD-10-CM | POA: Diagnosis not present

## 2015-07-03 DIAGNOSIS — Z992 Dependence on renal dialysis: Secondary | ICD-10-CM | POA: Diagnosis not present

## 2015-07-03 DIAGNOSIS — R110 Nausea: Secondary | ICD-10-CM | POA: Diagnosis not present

## 2015-07-03 DIAGNOSIS — D509 Iron deficiency anemia, unspecified: Secondary | ICD-10-CM | POA: Diagnosis not present

## 2015-07-03 DIAGNOSIS — N2581 Secondary hyperparathyroidism of renal origin: Secondary | ICD-10-CM | POA: Diagnosis not present

## 2015-07-03 DIAGNOSIS — Z23 Encounter for immunization: Secondary | ICD-10-CM | POA: Diagnosis not present

## 2015-07-06 DIAGNOSIS — N186 End stage renal disease: Secondary | ICD-10-CM | POA: Diagnosis not present

## 2015-07-06 DIAGNOSIS — R110 Nausea: Secondary | ICD-10-CM | POA: Diagnosis not present

## 2015-07-06 DIAGNOSIS — D509 Iron deficiency anemia, unspecified: Secondary | ICD-10-CM | POA: Diagnosis not present

## 2015-07-06 DIAGNOSIS — Z992 Dependence on renal dialysis: Secondary | ICD-10-CM | POA: Diagnosis not present

## 2015-07-06 DIAGNOSIS — N2581 Secondary hyperparathyroidism of renal origin: Secondary | ICD-10-CM | POA: Diagnosis not present

## 2015-07-06 DIAGNOSIS — Z23 Encounter for immunization: Secondary | ICD-10-CM | POA: Diagnosis not present

## 2015-07-08 DIAGNOSIS — N186 End stage renal disease: Secondary | ICD-10-CM | POA: Diagnosis not present

## 2015-07-08 DIAGNOSIS — N2581 Secondary hyperparathyroidism of renal origin: Secondary | ICD-10-CM | POA: Diagnosis not present

## 2015-07-08 DIAGNOSIS — D509 Iron deficiency anemia, unspecified: Secondary | ICD-10-CM | POA: Diagnosis not present

## 2015-07-08 DIAGNOSIS — Z992 Dependence on renal dialysis: Secondary | ICD-10-CM | POA: Diagnosis not present

## 2015-07-08 DIAGNOSIS — R110 Nausea: Secondary | ICD-10-CM | POA: Diagnosis not present

## 2015-07-08 DIAGNOSIS — Z23 Encounter for immunization: Secondary | ICD-10-CM | POA: Diagnosis not present

## 2015-07-10 DIAGNOSIS — N186 End stage renal disease: Secondary | ICD-10-CM | POA: Diagnosis not present

## 2015-07-10 DIAGNOSIS — R110 Nausea: Secondary | ICD-10-CM | POA: Diagnosis not present

## 2015-07-10 DIAGNOSIS — Z23 Encounter for immunization: Secondary | ICD-10-CM | POA: Diagnosis not present

## 2015-07-10 DIAGNOSIS — N2581 Secondary hyperparathyroidism of renal origin: Secondary | ICD-10-CM | POA: Diagnosis not present

## 2015-07-10 DIAGNOSIS — D509 Iron deficiency anemia, unspecified: Secondary | ICD-10-CM | POA: Diagnosis not present

## 2015-07-10 DIAGNOSIS — Z992 Dependence on renal dialysis: Secondary | ICD-10-CM | POA: Diagnosis not present

## 2015-07-13 DIAGNOSIS — N2581 Secondary hyperparathyroidism of renal origin: Secondary | ICD-10-CM | POA: Diagnosis not present

## 2015-07-13 DIAGNOSIS — Z23 Encounter for immunization: Secondary | ICD-10-CM | POA: Diagnosis not present

## 2015-07-13 DIAGNOSIS — D509 Iron deficiency anemia, unspecified: Secondary | ICD-10-CM | POA: Diagnosis not present

## 2015-07-13 DIAGNOSIS — E119 Type 2 diabetes mellitus without complications: Secondary | ICD-10-CM | POA: Diagnosis not present

## 2015-07-13 DIAGNOSIS — R110 Nausea: Secondary | ICD-10-CM | POA: Diagnosis not present

## 2015-07-13 DIAGNOSIS — N186 End stage renal disease: Secondary | ICD-10-CM | POA: Diagnosis not present

## 2015-07-13 DIAGNOSIS — Z992 Dependence on renal dialysis: Secondary | ICD-10-CM | POA: Diagnosis not present

## 2015-07-13 DIAGNOSIS — Z794 Long term (current) use of insulin: Secondary | ICD-10-CM | POA: Diagnosis not present

## 2015-07-15 DIAGNOSIS — Z23 Encounter for immunization: Secondary | ICD-10-CM | POA: Diagnosis not present

## 2015-07-15 DIAGNOSIS — D509 Iron deficiency anemia, unspecified: Secondary | ICD-10-CM | POA: Diagnosis not present

## 2015-07-15 DIAGNOSIS — Z992 Dependence on renal dialysis: Secondary | ICD-10-CM | POA: Diagnosis not present

## 2015-07-15 DIAGNOSIS — R110 Nausea: Secondary | ICD-10-CM | POA: Diagnosis not present

## 2015-07-15 DIAGNOSIS — N2581 Secondary hyperparathyroidism of renal origin: Secondary | ICD-10-CM | POA: Diagnosis not present

## 2015-07-15 DIAGNOSIS — N186 End stage renal disease: Secondary | ICD-10-CM | POA: Diagnosis not present

## 2015-07-17 DIAGNOSIS — D509 Iron deficiency anemia, unspecified: Secondary | ICD-10-CM | POA: Diagnosis not present

## 2015-07-17 DIAGNOSIS — Z992 Dependence on renal dialysis: Secondary | ICD-10-CM | POA: Diagnosis not present

## 2015-07-17 DIAGNOSIS — R110 Nausea: Secondary | ICD-10-CM | POA: Diagnosis not present

## 2015-07-17 DIAGNOSIS — Z23 Encounter for immunization: Secondary | ICD-10-CM | POA: Diagnosis not present

## 2015-07-17 DIAGNOSIS — N186 End stage renal disease: Secondary | ICD-10-CM | POA: Diagnosis not present

## 2015-07-17 DIAGNOSIS — N2581 Secondary hyperparathyroidism of renal origin: Secondary | ICD-10-CM | POA: Diagnosis not present

## 2015-07-20 DIAGNOSIS — N186 End stage renal disease: Secondary | ICD-10-CM | POA: Diagnosis not present

## 2015-07-20 DIAGNOSIS — Z992 Dependence on renal dialysis: Secondary | ICD-10-CM | POA: Diagnosis not present

## 2015-07-20 DIAGNOSIS — D509 Iron deficiency anemia, unspecified: Secondary | ICD-10-CM | POA: Diagnosis not present

## 2015-07-22 DIAGNOSIS — Z992 Dependence on renal dialysis: Secondary | ICD-10-CM | POA: Diagnosis not present

## 2015-07-22 DIAGNOSIS — D509 Iron deficiency anemia, unspecified: Secondary | ICD-10-CM | POA: Diagnosis not present

## 2015-07-22 DIAGNOSIS — N186 End stage renal disease: Secondary | ICD-10-CM | POA: Diagnosis not present

## 2015-07-23 DIAGNOSIS — G541 Lumbosacral plexus disorders: Secondary | ICD-10-CM | POA: Diagnosis not present

## 2015-07-23 DIAGNOSIS — G603 Idiopathic progressive neuropathy: Secondary | ICD-10-CM | POA: Diagnosis not present

## 2015-07-23 DIAGNOSIS — G8929 Other chronic pain: Secondary | ICD-10-CM | POA: Diagnosis not present

## 2015-07-23 DIAGNOSIS — M545 Low back pain: Secondary | ICD-10-CM | POA: Diagnosis not present

## 2015-07-25 DIAGNOSIS — Z992 Dependence on renal dialysis: Secondary | ICD-10-CM | POA: Diagnosis not present

## 2015-07-25 DIAGNOSIS — D509 Iron deficiency anemia, unspecified: Secondary | ICD-10-CM | POA: Diagnosis not present

## 2015-07-25 DIAGNOSIS — N186 End stage renal disease: Secondary | ICD-10-CM | POA: Diagnosis not present

## 2015-07-27 DIAGNOSIS — D509 Iron deficiency anemia, unspecified: Secondary | ICD-10-CM | POA: Diagnosis not present

## 2015-07-27 DIAGNOSIS — Z992 Dependence on renal dialysis: Secondary | ICD-10-CM | POA: Diagnosis not present

## 2015-07-27 DIAGNOSIS — N186 End stage renal disease: Secondary | ICD-10-CM | POA: Diagnosis not present

## 2015-07-29 DIAGNOSIS — Z992 Dependence on renal dialysis: Secondary | ICD-10-CM | POA: Diagnosis not present

## 2015-07-29 DIAGNOSIS — D509 Iron deficiency anemia, unspecified: Secondary | ICD-10-CM | POA: Diagnosis not present

## 2015-07-29 DIAGNOSIS — N186 End stage renal disease: Secondary | ICD-10-CM | POA: Diagnosis not present

## 2015-07-31 DIAGNOSIS — Z992 Dependence on renal dialysis: Secondary | ICD-10-CM | POA: Diagnosis not present

## 2015-07-31 DIAGNOSIS — D509 Iron deficiency anemia, unspecified: Secondary | ICD-10-CM | POA: Diagnosis not present

## 2015-07-31 DIAGNOSIS — N186 End stage renal disease: Secondary | ICD-10-CM | POA: Diagnosis not present

## 2015-08-03 DIAGNOSIS — N186 End stage renal disease: Secondary | ICD-10-CM | POA: Diagnosis not present

## 2015-08-03 DIAGNOSIS — D509 Iron deficiency anemia, unspecified: Secondary | ICD-10-CM | POA: Diagnosis not present

## 2015-08-03 DIAGNOSIS — Z992 Dependence on renal dialysis: Secondary | ICD-10-CM | POA: Diagnosis not present

## 2015-08-05 DIAGNOSIS — Z992 Dependence on renal dialysis: Secondary | ICD-10-CM | POA: Diagnosis not present

## 2015-08-05 DIAGNOSIS — D509 Iron deficiency anemia, unspecified: Secondary | ICD-10-CM | POA: Diagnosis not present

## 2015-08-05 DIAGNOSIS — N186 End stage renal disease: Secondary | ICD-10-CM | POA: Diagnosis not present

## 2015-08-07 DIAGNOSIS — D509 Iron deficiency anemia, unspecified: Secondary | ICD-10-CM | POA: Diagnosis not present

## 2015-08-07 DIAGNOSIS — N186 End stage renal disease: Secondary | ICD-10-CM | POA: Diagnosis not present

## 2015-08-07 DIAGNOSIS — Z992 Dependence on renal dialysis: Secondary | ICD-10-CM | POA: Diagnosis not present

## 2015-08-10 DIAGNOSIS — D509 Iron deficiency anemia, unspecified: Secondary | ICD-10-CM | POA: Diagnosis not present

## 2015-08-10 DIAGNOSIS — Z992 Dependence on renal dialysis: Secondary | ICD-10-CM | POA: Diagnosis not present

## 2015-08-10 DIAGNOSIS — N186 End stage renal disease: Secondary | ICD-10-CM | POA: Diagnosis not present

## 2015-08-12 DIAGNOSIS — Z992 Dependence on renal dialysis: Secondary | ICD-10-CM | POA: Diagnosis not present

## 2015-08-12 DIAGNOSIS — N186 End stage renal disease: Secondary | ICD-10-CM | POA: Diagnosis not present

## 2015-08-12 DIAGNOSIS — D509 Iron deficiency anemia, unspecified: Secondary | ICD-10-CM | POA: Diagnosis not present

## 2015-08-14 DIAGNOSIS — Z992 Dependence on renal dialysis: Secondary | ICD-10-CM | POA: Diagnosis not present

## 2015-08-14 DIAGNOSIS — D509 Iron deficiency anemia, unspecified: Secondary | ICD-10-CM | POA: Diagnosis not present

## 2015-08-14 DIAGNOSIS — N186 End stage renal disease: Secondary | ICD-10-CM | POA: Diagnosis not present

## 2015-08-17 DIAGNOSIS — D509 Iron deficiency anemia, unspecified: Secondary | ICD-10-CM | POA: Diagnosis not present

## 2015-08-17 DIAGNOSIS — N186 End stage renal disease: Secondary | ICD-10-CM | POA: Diagnosis not present

## 2015-08-17 DIAGNOSIS — Z992 Dependence on renal dialysis: Secondary | ICD-10-CM | POA: Diagnosis not present

## 2015-08-19 DIAGNOSIS — D509 Iron deficiency anemia, unspecified: Secondary | ICD-10-CM | POA: Diagnosis not present

## 2015-08-19 DIAGNOSIS — N186 End stage renal disease: Secondary | ICD-10-CM | POA: Diagnosis not present

## 2015-08-19 DIAGNOSIS — R110 Nausea: Secondary | ICD-10-CM | POA: Diagnosis not present

## 2015-08-19 DIAGNOSIS — Z992 Dependence on renal dialysis: Secondary | ICD-10-CM | POA: Diagnosis not present

## 2015-08-20 DIAGNOSIS — G8929 Other chronic pain: Secondary | ICD-10-CM | POA: Diagnosis not present

## 2015-08-20 DIAGNOSIS — M545 Low back pain: Secondary | ICD-10-CM | POA: Diagnosis not present

## 2015-08-21 DIAGNOSIS — L501 Idiopathic urticaria: Secondary | ICD-10-CM | POA: Diagnosis not present

## 2015-08-22 DIAGNOSIS — R110 Nausea: Secondary | ICD-10-CM | POA: Diagnosis not present

## 2015-08-22 DIAGNOSIS — D509 Iron deficiency anemia, unspecified: Secondary | ICD-10-CM | POA: Diagnosis not present

## 2015-08-22 DIAGNOSIS — Z992 Dependence on renal dialysis: Secondary | ICD-10-CM | POA: Diagnosis not present

## 2015-08-22 DIAGNOSIS — N186 End stage renal disease: Secondary | ICD-10-CM | POA: Diagnosis not present

## 2015-08-24 DIAGNOSIS — Z992 Dependence on renal dialysis: Secondary | ICD-10-CM | POA: Diagnosis not present

## 2015-08-24 DIAGNOSIS — D509 Iron deficiency anemia, unspecified: Secondary | ICD-10-CM | POA: Diagnosis not present

## 2015-08-24 DIAGNOSIS — N186 End stage renal disease: Secondary | ICD-10-CM | POA: Diagnosis not present

## 2015-08-24 DIAGNOSIS — R110 Nausea: Secondary | ICD-10-CM | POA: Diagnosis not present

## 2015-08-27 DIAGNOSIS — Z992 Dependence on renal dialysis: Secondary | ICD-10-CM | POA: Diagnosis not present

## 2015-08-27 DIAGNOSIS — R110 Nausea: Secondary | ICD-10-CM | POA: Diagnosis not present

## 2015-08-27 DIAGNOSIS — D509 Iron deficiency anemia, unspecified: Secondary | ICD-10-CM | POA: Diagnosis not present

## 2015-08-27 DIAGNOSIS — N186 End stage renal disease: Secondary | ICD-10-CM | POA: Diagnosis not present

## 2015-08-28 DIAGNOSIS — R110 Nausea: Secondary | ICD-10-CM | POA: Diagnosis not present

## 2015-08-28 DIAGNOSIS — D509 Iron deficiency anemia, unspecified: Secondary | ICD-10-CM | POA: Diagnosis not present

## 2015-08-28 DIAGNOSIS — N186 End stage renal disease: Secondary | ICD-10-CM | POA: Diagnosis not present

## 2015-08-28 DIAGNOSIS — Z992 Dependence on renal dialysis: Secondary | ICD-10-CM | POA: Diagnosis not present

## 2015-08-31 DIAGNOSIS — N186 End stage renal disease: Secondary | ICD-10-CM | POA: Diagnosis not present

## 2015-08-31 DIAGNOSIS — D509 Iron deficiency anemia, unspecified: Secondary | ICD-10-CM | POA: Diagnosis not present

## 2015-08-31 DIAGNOSIS — Z992 Dependence on renal dialysis: Secondary | ICD-10-CM | POA: Diagnosis not present

## 2015-08-31 DIAGNOSIS — R110 Nausea: Secondary | ICD-10-CM | POA: Diagnosis not present

## 2015-09-01 DIAGNOSIS — E113592 Type 2 diabetes mellitus with proliferative diabetic retinopathy without macular edema, left eye: Secondary | ICD-10-CM | POA: Diagnosis not present

## 2015-09-02 DIAGNOSIS — D509 Iron deficiency anemia, unspecified: Secondary | ICD-10-CM | POA: Diagnosis not present

## 2015-09-02 DIAGNOSIS — Z992 Dependence on renal dialysis: Secondary | ICD-10-CM | POA: Diagnosis not present

## 2015-09-02 DIAGNOSIS — R110 Nausea: Secondary | ICD-10-CM | POA: Diagnosis not present

## 2015-09-02 DIAGNOSIS — N186 End stage renal disease: Secondary | ICD-10-CM | POA: Diagnosis not present

## 2015-09-04 DIAGNOSIS — R110 Nausea: Secondary | ICD-10-CM | POA: Diagnosis not present

## 2015-09-04 DIAGNOSIS — Z992 Dependence on renal dialysis: Secondary | ICD-10-CM | POA: Diagnosis not present

## 2015-09-04 DIAGNOSIS — N186 End stage renal disease: Secondary | ICD-10-CM | POA: Diagnosis not present

## 2015-09-04 DIAGNOSIS — D509 Iron deficiency anemia, unspecified: Secondary | ICD-10-CM | POA: Diagnosis not present

## 2015-09-07 DIAGNOSIS — D509 Iron deficiency anemia, unspecified: Secondary | ICD-10-CM | POA: Diagnosis not present

## 2015-09-07 DIAGNOSIS — Z992 Dependence on renal dialysis: Secondary | ICD-10-CM | POA: Diagnosis not present

## 2015-09-07 DIAGNOSIS — R110 Nausea: Secondary | ICD-10-CM | POA: Diagnosis not present

## 2015-09-07 DIAGNOSIS — N186 End stage renal disease: Secondary | ICD-10-CM | POA: Diagnosis not present

## 2015-09-09 DIAGNOSIS — D509 Iron deficiency anemia, unspecified: Secondary | ICD-10-CM | POA: Diagnosis not present

## 2015-09-09 DIAGNOSIS — N186 End stage renal disease: Secondary | ICD-10-CM | POA: Diagnosis not present

## 2015-09-09 DIAGNOSIS — Z992 Dependence on renal dialysis: Secondary | ICD-10-CM | POA: Diagnosis not present

## 2015-09-09 DIAGNOSIS — R110 Nausea: Secondary | ICD-10-CM | POA: Diagnosis not present

## 2015-09-12 DIAGNOSIS — D509 Iron deficiency anemia, unspecified: Secondary | ICD-10-CM | POA: Diagnosis not present

## 2015-09-12 DIAGNOSIS — R110 Nausea: Secondary | ICD-10-CM | POA: Diagnosis not present

## 2015-09-12 DIAGNOSIS — N186 End stage renal disease: Secondary | ICD-10-CM | POA: Diagnosis not present

## 2015-09-12 DIAGNOSIS — Z992 Dependence on renal dialysis: Secondary | ICD-10-CM | POA: Diagnosis not present

## 2015-09-14 DIAGNOSIS — D509 Iron deficiency anemia, unspecified: Secondary | ICD-10-CM | POA: Diagnosis not present

## 2015-09-14 DIAGNOSIS — R110 Nausea: Secondary | ICD-10-CM | POA: Diagnosis not present

## 2015-09-14 DIAGNOSIS — N186 End stage renal disease: Secondary | ICD-10-CM | POA: Diagnosis not present

## 2015-09-14 DIAGNOSIS — Z992 Dependence on renal dialysis: Secondary | ICD-10-CM | POA: Diagnosis not present

## 2015-09-16 DIAGNOSIS — D509 Iron deficiency anemia, unspecified: Secondary | ICD-10-CM | POA: Diagnosis not present

## 2015-09-16 DIAGNOSIS — N186 End stage renal disease: Secondary | ICD-10-CM | POA: Diagnosis not present

## 2015-09-16 DIAGNOSIS — R110 Nausea: Secondary | ICD-10-CM | POA: Diagnosis not present

## 2015-09-16 DIAGNOSIS — Z992 Dependence on renal dialysis: Secondary | ICD-10-CM | POA: Diagnosis not present

## 2015-09-17 DIAGNOSIS — G8929 Other chronic pain: Secondary | ICD-10-CM | POA: Diagnosis not present

## 2015-09-17 DIAGNOSIS — M545 Low back pain: Secondary | ICD-10-CM | POA: Diagnosis not present

## 2015-09-18 DIAGNOSIS — L0231 Cutaneous abscess of buttock: Secondary | ICD-10-CM | POA: Diagnosis not present

## 2015-09-18 DIAGNOSIS — Z992 Dependence on renal dialysis: Secondary | ICD-10-CM | POA: Diagnosis not present

## 2015-09-18 DIAGNOSIS — N186 End stage renal disease: Secondary | ICD-10-CM | POA: Diagnosis not present

## 2015-09-18 DIAGNOSIS — D509 Iron deficiency anemia, unspecified: Secondary | ICD-10-CM | POA: Diagnosis not present

## 2015-09-18 DIAGNOSIS — N2581 Secondary hyperparathyroidism of renal origin: Secondary | ICD-10-CM | POA: Diagnosis not present

## 2015-09-21 DIAGNOSIS — D509 Iron deficiency anemia, unspecified: Secondary | ICD-10-CM | POA: Diagnosis not present

## 2015-09-21 DIAGNOSIS — N2581 Secondary hyperparathyroidism of renal origin: Secondary | ICD-10-CM | POA: Diagnosis not present

## 2015-09-21 DIAGNOSIS — L0231 Cutaneous abscess of buttock: Secondary | ICD-10-CM | POA: Diagnosis not present

## 2015-09-21 DIAGNOSIS — Z992 Dependence on renal dialysis: Secondary | ICD-10-CM | POA: Diagnosis not present

## 2015-09-21 DIAGNOSIS — N186 End stage renal disease: Secondary | ICD-10-CM | POA: Diagnosis not present

## 2015-09-23 DIAGNOSIS — D509 Iron deficiency anemia, unspecified: Secondary | ICD-10-CM | POA: Diagnosis not present

## 2015-09-23 DIAGNOSIS — Z992 Dependence on renal dialysis: Secondary | ICD-10-CM | POA: Diagnosis not present

## 2015-09-23 DIAGNOSIS — N186 End stage renal disease: Secondary | ICD-10-CM | POA: Diagnosis not present

## 2015-09-23 DIAGNOSIS — L0231 Cutaneous abscess of buttock: Secondary | ICD-10-CM | POA: Diagnosis not present

## 2015-09-23 DIAGNOSIS — N2581 Secondary hyperparathyroidism of renal origin: Secondary | ICD-10-CM | POA: Diagnosis not present

## 2015-09-25 DIAGNOSIS — Z992 Dependence on renal dialysis: Secondary | ICD-10-CM | POA: Diagnosis not present

## 2015-09-25 DIAGNOSIS — L0231 Cutaneous abscess of buttock: Secondary | ICD-10-CM | POA: Diagnosis not present

## 2015-09-25 DIAGNOSIS — N2581 Secondary hyperparathyroidism of renal origin: Secondary | ICD-10-CM | POA: Diagnosis not present

## 2015-09-25 DIAGNOSIS — D509 Iron deficiency anemia, unspecified: Secondary | ICD-10-CM | POA: Diagnosis not present

## 2015-09-25 DIAGNOSIS — N186 End stage renal disease: Secondary | ICD-10-CM | POA: Diagnosis not present

## 2015-09-28 DIAGNOSIS — L0231 Cutaneous abscess of buttock: Secondary | ICD-10-CM | POA: Diagnosis not present

## 2015-09-28 DIAGNOSIS — N186 End stage renal disease: Secondary | ICD-10-CM | POA: Diagnosis not present

## 2015-09-28 DIAGNOSIS — Z992 Dependence on renal dialysis: Secondary | ICD-10-CM | POA: Diagnosis not present

## 2015-09-28 DIAGNOSIS — D509 Iron deficiency anemia, unspecified: Secondary | ICD-10-CM | POA: Diagnosis not present

## 2015-09-28 DIAGNOSIS — N2581 Secondary hyperparathyroidism of renal origin: Secondary | ICD-10-CM | POA: Diagnosis not present

## 2015-09-30 DIAGNOSIS — D509 Iron deficiency anemia, unspecified: Secondary | ICD-10-CM | POA: Diagnosis not present

## 2015-09-30 DIAGNOSIS — N2581 Secondary hyperparathyroidism of renal origin: Secondary | ICD-10-CM | POA: Diagnosis not present

## 2015-09-30 DIAGNOSIS — L0231 Cutaneous abscess of buttock: Secondary | ICD-10-CM | POA: Diagnosis not present

## 2015-09-30 DIAGNOSIS — N186 End stage renal disease: Secondary | ICD-10-CM | POA: Diagnosis not present

## 2015-09-30 DIAGNOSIS — Z992 Dependence on renal dialysis: Secondary | ICD-10-CM | POA: Diagnosis not present

## 2015-10-02 DIAGNOSIS — D509 Iron deficiency anemia, unspecified: Secondary | ICD-10-CM | POA: Diagnosis not present

## 2015-10-02 DIAGNOSIS — Z992 Dependence on renal dialysis: Secondary | ICD-10-CM | POA: Diagnosis not present

## 2015-10-02 DIAGNOSIS — N186 End stage renal disease: Secondary | ICD-10-CM | POA: Diagnosis not present

## 2015-10-02 DIAGNOSIS — N2581 Secondary hyperparathyroidism of renal origin: Secondary | ICD-10-CM | POA: Diagnosis not present

## 2015-10-02 DIAGNOSIS — L0231 Cutaneous abscess of buttock: Secondary | ICD-10-CM | POA: Diagnosis not present

## 2015-10-06 DIAGNOSIS — Z794 Long term (current) use of insulin: Secondary | ICD-10-CM | POA: Diagnosis not present

## 2015-10-06 DIAGNOSIS — E119 Type 2 diabetes mellitus without complications: Secondary | ICD-10-CM | POA: Diagnosis not present

## 2015-10-06 DIAGNOSIS — L0231 Cutaneous abscess of buttock: Secondary | ICD-10-CM | POA: Diagnosis not present

## 2015-10-06 DIAGNOSIS — N186 End stage renal disease: Secondary | ICD-10-CM | POA: Diagnosis not present

## 2015-10-06 DIAGNOSIS — D509 Iron deficiency anemia, unspecified: Secondary | ICD-10-CM | POA: Diagnosis not present

## 2015-10-06 DIAGNOSIS — N2581 Secondary hyperparathyroidism of renal origin: Secondary | ICD-10-CM | POA: Diagnosis not present

## 2015-10-06 DIAGNOSIS — Z992 Dependence on renal dialysis: Secondary | ICD-10-CM | POA: Diagnosis not present

## 2015-10-07 DIAGNOSIS — L0231 Cutaneous abscess of buttock: Secondary | ICD-10-CM | POA: Diagnosis not present

## 2015-10-07 DIAGNOSIS — N2581 Secondary hyperparathyroidism of renal origin: Secondary | ICD-10-CM | POA: Diagnosis not present

## 2015-10-07 DIAGNOSIS — N186 End stage renal disease: Secondary | ICD-10-CM | POA: Diagnosis not present

## 2015-10-07 DIAGNOSIS — Z992 Dependence on renal dialysis: Secondary | ICD-10-CM | POA: Diagnosis not present

## 2015-10-07 DIAGNOSIS — D509 Iron deficiency anemia, unspecified: Secondary | ICD-10-CM | POA: Diagnosis not present

## 2015-10-09 DIAGNOSIS — N2581 Secondary hyperparathyroidism of renal origin: Secondary | ICD-10-CM | POA: Diagnosis not present

## 2015-10-09 DIAGNOSIS — L0231 Cutaneous abscess of buttock: Secondary | ICD-10-CM | POA: Diagnosis not present

## 2015-10-09 DIAGNOSIS — Z992 Dependence on renal dialysis: Secondary | ICD-10-CM | POA: Diagnosis not present

## 2015-10-09 DIAGNOSIS — N186 End stage renal disease: Secondary | ICD-10-CM | POA: Diagnosis not present

## 2015-10-09 DIAGNOSIS — D509 Iron deficiency anemia, unspecified: Secondary | ICD-10-CM | POA: Diagnosis not present

## 2015-10-12 DIAGNOSIS — D509 Iron deficiency anemia, unspecified: Secondary | ICD-10-CM | POA: Diagnosis not present

## 2015-10-12 DIAGNOSIS — L0231 Cutaneous abscess of buttock: Secondary | ICD-10-CM | POA: Diagnosis not present

## 2015-10-12 DIAGNOSIS — N186 End stage renal disease: Secondary | ICD-10-CM | POA: Diagnosis not present

## 2015-10-12 DIAGNOSIS — Z992 Dependence on renal dialysis: Secondary | ICD-10-CM | POA: Diagnosis not present

## 2015-10-12 DIAGNOSIS — N2581 Secondary hyperparathyroidism of renal origin: Secondary | ICD-10-CM | POA: Diagnosis not present

## 2015-10-14 DIAGNOSIS — D509 Iron deficiency anemia, unspecified: Secondary | ICD-10-CM | POA: Diagnosis not present

## 2015-10-14 DIAGNOSIS — Z992 Dependence on renal dialysis: Secondary | ICD-10-CM | POA: Diagnosis not present

## 2015-10-14 DIAGNOSIS — L0231 Cutaneous abscess of buttock: Secondary | ICD-10-CM | POA: Diagnosis not present

## 2015-10-14 DIAGNOSIS — N186 End stage renal disease: Secondary | ICD-10-CM | POA: Diagnosis not present

## 2015-10-14 DIAGNOSIS — N2581 Secondary hyperparathyroidism of renal origin: Secondary | ICD-10-CM | POA: Diagnosis not present

## 2015-10-16 DIAGNOSIS — D509 Iron deficiency anemia, unspecified: Secondary | ICD-10-CM | POA: Diagnosis not present

## 2015-10-16 DIAGNOSIS — L0231 Cutaneous abscess of buttock: Secondary | ICD-10-CM | POA: Diagnosis not present

## 2015-10-16 DIAGNOSIS — Z992 Dependence on renal dialysis: Secondary | ICD-10-CM | POA: Diagnosis not present

## 2015-10-16 DIAGNOSIS — N2581 Secondary hyperparathyroidism of renal origin: Secondary | ICD-10-CM | POA: Diagnosis not present

## 2015-10-16 DIAGNOSIS — N186 End stage renal disease: Secondary | ICD-10-CM | POA: Diagnosis not present

## 2015-10-17 DIAGNOSIS — Z992 Dependence on renal dialysis: Secondary | ICD-10-CM | POA: Diagnosis not present

## 2015-10-17 DIAGNOSIS — N186 End stage renal disease: Secondary | ICD-10-CM | POA: Diagnosis not present

## 2015-10-19 DIAGNOSIS — D509 Iron deficiency anemia, unspecified: Secondary | ICD-10-CM | POA: Diagnosis not present

## 2015-10-19 DIAGNOSIS — Z992 Dependence on renal dialysis: Secondary | ICD-10-CM | POA: Diagnosis not present

## 2015-10-19 DIAGNOSIS — N186 End stage renal disease: Secondary | ICD-10-CM | POA: Diagnosis not present

## 2015-10-21 DIAGNOSIS — R202 Paresthesia of skin: Secondary | ICD-10-CM | POA: Diagnosis not present

## 2015-10-21 DIAGNOSIS — M5441 Lumbago with sciatica, right side: Secondary | ICD-10-CM | POA: Diagnosis not present

## 2015-10-21 DIAGNOSIS — M5432 Sciatica, left side: Secondary | ICD-10-CM | POA: Diagnosis not present

## 2015-10-21 DIAGNOSIS — G894 Chronic pain syndrome: Secondary | ICD-10-CM | POA: Diagnosis not present

## 2015-10-21 DIAGNOSIS — Z79891 Long term (current) use of opiate analgesic: Secondary | ICD-10-CM | POA: Diagnosis not present

## 2015-10-21 DIAGNOSIS — M545 Low back pain: Secondary | ICD-10-CM | POA: Diagnosis not present

## 2015-10-21 DIAGNOSIS — G8929 Other chronic pain: Secondary | ICD-10-CM | POA: Diagnosis not present

## 2015-10-22 DIAGNOSIS — N186 End stage renal disease: Secondary | ICD-10-CM | POA: Diagnosis not present

## 2015-10-22 DIAGNOSIS — Z992 Dependence on renal dialysis: Secondary | ICD-10-CM | POA: Diagnosis not present

## 2015-10-22 DIAGNOSIS — D509 Iron deficiency anemia, unspecified: Secondary | ICD-10-CM | POA: Diagnosis not present

## 2015-10-23 DIAGNOSIS — N186 End stage renal disease: Secondary | ICD-10-CM | POA: Diagnosis not present

## 2015-10-23 DIAGNOSIS — Z992 Dependence on renal dialysis: Secondary | ICD-10-CM | POA: Diagnosis not present

## 2015-10-23 DIAGNOSIS — D509 Iron deficiency anemia, unspecified: Secondary | ICD-10-CM | POA: Diagnosis not present

## 2015-10-27 DIAGNOSIS — Z992 Dependence on renal dialysis: Secondary | ICD-10-CM | POA: Diagnosis not present

## 2015-10-27 DIAGNOSIS — N186 End stage renal disease: Secondary | ICD-10-CM | POA: Diagnosis not present

## 2015-10-27 DIAGNOSIS — D509 Iron deficiency anemia, unspecified: Secondary | ICD-10-CM | POA: Diagnosis not present

## 2015-10-28 DIAGNOSIS — N186 End stage renal disease: Secondary | ICD-10-CM | POA: Diagnosis not present

## 2015-10-28 DIAGNOSIS — Z992 Dependence on renal dialysis: Secondary | ICD-10-CM | POA: Diagnosis not present

## 2015-10-28 DIAGNOSIS — D509 Iron deficiency anemia, unspecified: Secondary | ICD-10-CM | POA: Diagnosis not present

## 2015-10-30 DIAGNOSIS — D509 Iron deficiency anemia, unspecified: Secondary | ICD-10-CM | POA: Diagnosis not present

## 2015-10-30 DIAGNOSIS — Z992 Dependence on renal dialysis: Secondary | ICD-10-CM | POA: Diagnosis not present

## 2015-10-30 DIAGNOSIS — N186 End stage renal disease: Secondary | ICD-10-CM | POA: Diagnosis not present

## 2015-11-02 DIAGNOSIS — D509 Iron deficiency anemia, unspecified: Secondary | ICD-10-CM | POA: Diagnosis not present

## 2015-11-02 DIAGNOSIS — N186 End stage renal disease: Secondary | ICD-10-CM | POA: Diagnosis not present

## 2015-11-02 DIAGNOSIS — Z992 Dependence on renal dialysis: Secondary | ICD-10-CM | POA: Diagnosis not present

## 2015-11-04 DIAGNOSIS — Z992 Dependence on renal dialysis: Secondary | ICD-10-CM | POA: Diagnosis not present

## 2015-11-04 DIAGNOSIS — D509 Iron deficiency anemia, unspecified: Secondary | ICD-10-CM | POA: Diagnosis not present

## 2015-11-04 DIAGNOSIS — N186 End stage renal disease: Secondary | ICD-10-CM | POA: Diagnosis not present

## 2015-11-06 DIAGNOSIS — N186 End stage renal disease: Secondary | ICD-10-CM | POA: Diagnosis not present

## 2015-11-06 DIAGNOSIS — D509 Iron deficiency anemia, unspecified: Secondary | ICD-10-CM | POA: Diagnosis not present

## 2015-11-06 DIAGNOSIS — Z992 Dependence on renal dialysis: Secondary | ICD-10-CM | POA: Diagnosis not present

## 2015-11-09 DIAGNOSIS — D509 Iron deficiency anemia, unspecified: Secondary | ICD-10-CM | POA: Diagnosis not present

## 2015-11-09 DIAGNOSIS — N186 End stage renal disease: Secondary | ICD-10-CM | POA: Diagnosis not present

## 2015-11-09 DIAGNOSIS — Z992 Dependence on renal dialysis: Secondary | ICD-10-CM | POA: Diagnosis not present

## 2015-11-11 DIAGNOSIS — Z992 Dependence on renal dialysis: Secondary | ICD-10-CM | POA: Diagnosis not present

## 2015-11-11 DIAGNOSIS — N186 End stage renal disease: Secondary | ICD-10-CM | POA: Diagnosis not present

## 2015-11-11 DIAGNOSIS — D509 Iron deficiency anemia, unspecified: Secondary | ICD-10-CM | POA: Diagnosis not present

## 2015-11-12 DIAGNOSIS — L501 Idiopathic urticaria: Secondary | ICD-10-CM | POA: Diagnosis not present

## 2015-11-12 DIAGNOSIS — E1122 Type 2 diabetes mellitus with diabetic chronic kidney disease: Secondary | ICD-10-CM | POA: Diagnosis not present

## 2015-11-12 DIAGNOSIS — E0789 Other specified disorders of thyroid: Secondary | ICD-10-CM | POA: Diagnosis not present

## 2015-11-13 DIAGNOSIS — N186 End stage renal disease: Secondary | ICD-10-CM | POA: Diagnosis not present

## 2015-11-13 DIAGNOSIS — Z992 Dependence on renal dialysis: Secondary | ICD-10-CM | POA: Diagnosis not present

## 2015-11-13 DIAGNOSIS — D509 Iron deficiency anemia, unspecified: Secondary | ICD-10-CM | POA: Diagnosis not present

## 2015-11-16 DIAGNOSIS — N186 End stage renal disease: Secondary | ICD-10-CM | POA: Diagnosis not present

## 2015-11-16 DIAGNOSIS — D509 Iron deficiency anemia, unspecified: Secondary | ICD-10-CM | POA: Diagnosis not present

## 2015-11-16 DIAGNOSIS — Z992 Dependence on renal dialysis: Secondary | ICD-10-CM | POA: Diagnosis not present

## 2015-11-17 DIAGNOSIS — Z79899 Other long term (current) drug therapy: Secondary | ICD-10-CM | POA: Diagnosis not present

## 2015-11-17 DIAGNOSIS — N186 End stage renal disease: Secondary | ICD-10-CM | POA: Diagnosis not present

## 2015-11-17 DIAGNOSIS — Z5181 Encounter for therapeutic drug level monitoring: Secondary | ICD-10-CM | POA: Diagnosis not present

## 2015-11-17 DIAGNOSIS — Z992 Dependence on renal dialysis: Secondary | ICD-10-CM | POA: Diagnosis not present

## 2015-11-18 DIAGNOSIS — M5441 Lumbago with sciatica, right side: Secondary | ICD-10-CM | POA: Diagnosis not present

## 2015-11-18 DIAGNOSIS — M5416 Radiculopathy, lumbar region: Secondary | ICD-10-CM | POA: Diagnosis not present

## 2015-11-18 DIAGNOSIS — M5432 Sciatica, left side: Secondary | ICD-10-CM | POA: Diagnosis not present

## 2015-11-18 DIAGNOSIS — R202 Paresthesia of skin: Secondary | ICD-10-CM | POA: Diagnosis not present

## 2015-11-18 DIAGNOSIS — Z79891 Long term (current) use of opiate analgesic: Secondary | ICD-10-CM | POA: Diagnosis not present

## 2015-11-18 DIAGNOSIS — M545 Low back pain: Secondary | ICD-10-CM | POA: Diagnosis not present

## 2015-11-18 DIAGNOSIS — G894 Chronic pain syndrome: Secondary | ICD-10-CM | POA: Diagnosis not present

## 2015-11-19 DIAGNOSIS — C73 Malignant neoplasm of thyroid gland: Secondary | ICD-10-CM | POA: Diagnosis not present

## 2015-11-19 DIAGNOSIS — E042 Nontoxic multinodular goiter: Secondary | ICD-10-CM | POA: Diagnosis not present

## 2015-11-20 DIAGNOSIS — R110 Nausea: Secondary | ICD-10-CM | POA: Diagnosis not present

## 2015-11-20 DIAGNOSIS — D509 Iron deficiency anemia, unspecified: Secondary | ICD-10-CM | POA: Diagnosis not present

## 2015-11-20 DIAGNOSIS — N186 End stage renal disease: Secondary | ICD-10-CM | POA: Diagnosis not present

## 2015-11-20 DIAGNOSIS — Z992 Dependence on renal dialysis: Secondary | ICD-10-CM | POA: Diagnosis not present

## 2015-11-21 DIAGNOSIS — D509 Iron deficiency anemia, unspecified: Secondary | ICD-10-CM | POA: Diagnosis not present

## 2015-11-21 DIAGNOSIS — N186 End stage renal disease: Secondary | ICD-10-CM | POA: Diagnosis not present

## 2015-11-21 DIAGNOSIS — R110 Nausea: Secondary | ICD-10-CM | POA: Diagnosis not present

## 2015-11-21 DIAGNOSIS — Z992 Dependence on renal dialysis: Secondary | ICD-10-CM | POA: Diagnosis not present

## 2015-11-23 DIAGNOSIS — D509 Iron deficiency anemia, unspecified: Secondary | ICD-10-CM | POA: Diagnosis not present

## 2015-11-23 DIAGNOSIS — R110 Nausea: Secondary | ICD-10-CM | POA: Diagnosis not present

## 2015-11-23 DIAGNOSIS — Z992 Dependence on renal dialysis: Secondary | ICD-10-CM | POA: Diagnosis not present

## 2015-11-23 DIAGNOSIS — N186 End stage renal disease: Secondary | ICD-10-CM | POA: Diagnosis not present

## 2015-11-25 DIAGNOSIS — N186 End stage renal disease: Secondary | ICD-10-CM | POA: Diagnosis not present

## 2015-11-25 DIAGNOSIS — R110 Nausea: Secondary | ICD-10-CM | POA: Diagnosis not present

## 2015-11-25 DIAGNOSIS — Z992 Dependence on renal dialysis: Secondary | ICD-10-CM | POA: Diagnosis not present

## 2015-11-25 DIAGNOSIS — D509 Iron deficiency anemia, unspecified: Secondary | ICD-10-CM | POA: Diagnosis not present

## 2015-11-27 DIAGNOSIS — Z992 Dependence on renal dialysis: Secondary | ICD-10-CM | POA: Diagnosis not present

## 2015-11-27 DIAGNOSIS — R110 Nausea: Secondary | ICD-10-CM | POA: Diagnosis not present

## 2015-11-27 DIAGNOSIS — D509 Iron deficiency anemia, unspecified: Secondary | ICD-10-CM | POA: Diagnosis not present

## 2015-11-27 DIAGNOSIS — N186 End stage renal disease: Secondary | ICD-10-CM | POA: Diagnosis not present

## 2015-11-30 DIAGNOSIS — R110 Nausea: Secondary | ICD-10-CM | POA: Diagnosis not present

## 2015-11-30 DIAGNOSIS — N186 End stage renal disease: Secondary | ICD-10-CM | POA: Diagnosis not present

## 2015-11-30 DIAGNOSIS — D509 Iron deficiency anemia, unspecified: Secondary | ICD-10-CM | POA: Diagnosis not present

## 2015-11-30 DIAGNOSIS — Z992 Dependence on renal dialysis: Secondary | ICD-10-CM | POA: Diagnosis not present

## 2015-12-02 DIAGNOSIS — D509 Iron deficiency anemia, unspecified: Secondary | ICD-10-CM | POA: Diagnosis not present

## 2015-12-02 DIAGNOSIS — R110 Nausea: Secondary | ICD-10-CM | POA: Diagnosis not present

## 2015-12-02 DIAGNOSIS — C73 Malignant neoplasm of thyroid gland: Secondary | ICD-10-CM | POA: Diagnosis not present

## 2015-12-02 DIAGNOSIS — Z992 Dependence on renal dialysis: Secondary | ICD-10-CM | POA: Diagnosis not present

## 2015-12-02 DIAGNOSIS — N186 End stage renal disease: Secondary | ICD-10-CM | POA: Diagnosis not present

## 2015-12-04 DIAGNOSIS — N186 End stage renal disease: Secondary | ICD-10-CM | POA: Diagnosis not present

## 2015-12-04 DIAGNOSIS — Z992 Dependence on renal dialysis: Secondary | ICD-10-CM | POA: Diagnosis not present

## 2015-12-04 DIAGNOSIS — D509 Iron deficiency anemia, unspecified: Secondary | ICD-10-CM | POA: Diagnosis not present

## 2015-12-04 DIAGNOSIS — R110 Nausea: Secondary | ICD-10-CM | POA: Diagnosis not present

## 2015-12-07 DIAGNOSIS — C73 Malignant neoplasm of thyroid gland: Secondary | ICD-10-CM | POA: Diagnosis not present

## 2015-12-08 DIAGNOSIS — D509 Iron deficiency anemia, unspecified: Secondary | ICD-10-CM | POA: Diagnosis not present

## 2015-12-08 DIAGNOSIS — R110 Nausea: Secondary | ICD-10-CM | POA: Diagnosis not present

## 2015-12-08 DIAGNOSIS — Z992 Dependence on renal dialysis: Secondary | ICD-10-CM | POA: Diagnosis not present

## 2015-12-08 DIAGNOSIS — N186 End stage renal disease: Secondary | ICD-10-CM | POA: Diagnosis not present

## 2015-12-09 DIAGNOSIS — D509 Iron deficiency anemia, unspecified: Secondary | ICD-10-CM | POA: Diagnosis not present

## 2015-12-09 DIAGNOSIS — N186 End stage renal disease: Secondary | ICD-10-CM | POA: Diagnosis not present

## 2015-12-09 DIAGNOSIS — Z992 Dependence on renal dialysis: Secondary | ICD-10-CM | POA: Diagnosis not present

## 2015-12-09 DIAGNOSIS — R110 Nausea: Secondary | ICD-10-CM | POA: Diagnosis not present

## 2015-12-11 DIAGNOSIS — I2510 Atherosclerotic heart disease of native coronary artery without angina pectoris: Secondary | ICD-10-CM | POA: Diagnosis not present

## 2015-12-11 DIAGNOSIS — Z7982 Long term (current) use of aspirin: Secondary | ICD-10-CM | POA: Diagnosis not present

## 2015-12-11 DIAGNOSIS — F17200 Nicotine dependence, unspecified, uncomplicated: Secondary | ICD-10-CM | POA: Diagnosis not present

## 2015-12-11 DIAGNOSIS — I252 Old myocardial infarction: Secondary | ICD-10-CM | POA: Diagnosis not present

## 2015-12-11 DIAGNOSIS — E042 Nontoxic multinodular goiter: Secondary | ICD-10-CM | POA: Diagnosis not present

## 2015-12-11 DIAGNOSIS — R251 Tremor, unspecified: Secondary | ICD-10-CM | POA: Diagnosis not present

## 2015-12-11 DIAGNOSIS — L509 Urticaria, unspecified: Secondary | ICD-10-CM | POA: Diagnosis not present

## 2015-12-11 DIAGNOSIS — Z992 Dependence on renal dialysis: Secondary | ICD-10-CM | POA: Diagnosis not present

## 2015-12-11 DIAGNOSIS — C73 Malignant neoplasm of thyroid gland: Secondary | ICD-10-CM | POA: Diagnosis not present

## 2015-12-11 DIAGNOSIS — I120 Hypertensive chronic kidney disease with stage 5 chronic kidney disease or end stage renal disease: Secondary | ICD-10-CM | POA: Diagnosis not present

## 2015-12-11 DIAGNOSIS — N186 End stage renal disease: Secondary | ICD-10-CM | POA: Diagnosis not present

## 2015-12-11 DIAGNOSIS — Z7902 Long term (current) use of antithrombotics/antiplatelets: Secondary | ICD-10-CM | POA: Diagnosis not present

## 2015-12-12 DIAGNOSIS — R110 Nausea: Secondary | ICD-10-CM | POA: Diagnosis not present

## 2015-12-12 DIAGNOSIS — Z992 Dependence on renal dialysis: Secondary | ICD-10-CM | POA: Diagnosis not present

## 2015-12-12 DIAGNOSIS — D509 Iron deficiency anemia, unspecified: Secondary | ICD-10-CM | POA: Diagnosis not present

## 2015-12-12 DIAGNOSIS — N186 End stage renal disease: Secondary | ICD-10-CM | POA: Diagnosis not present

## 2015-12-14 DIAGNOSIS — N186 End stage renal disease: Secondary | ICD-10-CM | POA: Diagnosis not present

## 2015-12-14 DIAGNOSIS — Z992 Dependence on renal dialysis: Secondary | ICD-10-CM | POA: Diagnosis not present

## 2015-12-14 DIAGNOSIS — R110 Nausea: Secondary | ICD-10-CM | POA: Diagnosis not present

## 2015-12-14 DIAGNOSIS — D509 Iron deficiency anemia, unspecified: Secondary | ICD-10-CM | POA: Diagnosis not present

## 2015-12-15 DIAGNOSIS — N186 End stage renal disease: Secondary | ICD-10-CM | POA: Diagnosis not present

## 2015-12-15 DIAGNOSIS — Z992 Dependence on renal dialysis: Secondary | ICD-10-CM | POA: Diagnosis not present

## 2015-12-16 DIAGNOSIS — N2581 Secondary hyperparathyroidism of renal origin: Secondary | ICD-10-CM | POA: Diagnosis not present

## 2015-12-16 DIAGNOSIS — Z992 Dependence on renal dialysis: Secondary | ICD-10-CM | POA: Diagnosis not present

## 2015-12-16 DIAGNOSIS — D509 Iron deficiency anemia, unspecified: Secondary | ICD-10-CM | POA: Diagnosis not present

## 2015-12-16 DIAGNOSIS — N186 End stage renal disease: Secondary | ICD-10-CM | POA: Diagnosis not present

## 2015-12-18 DIAGNOSIS — M542 Cervicalgia: Secondary | ICD-10-CM | POA: Diagnosis not present

## 2015-12-18 DIAGNOSIS — G894 Chronic pain syndrome: Secondary | ICD-10-CM | POA: Diagnosis not present

## 2015-12-18 DIAGNOSIS — M545 Low back pain: Secondary | ICD-10-CM | POA: Diagnosis not present

## 2015-12-18 DIAGNOSIS — R202 Paresthesia of skin: Secondary | ICD-10-CM | POA: Diagnosis not present

## 2015-12-19 DIAGNOSIS — N186 End stage renal disease: Secondary | ICD-10-CM | POA: Diagnosis not present

## 2015-12-19 DIAGNOSIS — Z992 Dependence on renal dialysis: Secondary | ICD-10-CM | POA: Diagnosis not present

## 2015-12-19 DIAGNOSIS — N2581 Secondary hyperparathyroidism of renal origin: Secondary | ICD-10-CM | POA: Diagnosis not present

## 2015-12-19 DIAGNOSIS — D509 Iron deficiency anemia, unspecified: Secondary | ICD-10-CM | POA: Diagnosis not present

## 2015-12-21 DIAGNOSIS — Z992 Dependence on renal dialysis: Secondary | ICD-10-CM | POA: Diagnosis not present

## 2015-12-21 DIAGNOSIS — N2581 Secondary hyperparathyroidism of renal origin: Secondary | ICD-10-CM | POA: Diagnosis not present

## 2015-12-21 DIAGNOSIS — D509 Iron deficiency anemia, unspecified: Secondary | ICD-10-CM | POA: Diagnosis not present

## 2015-12-21 DIAGNOSIS — N186 End stage renal disease: Secondary | ICD-10-CM | POA: Diagnosis not present

## 2015-12-23 DIAGNOSIS — N2581 Secondary hyperparathyroidism of renal origin: Secondary | ICD-10-CM | POA: Diagnosis not present

## 2015-12-23 DIAGNOSIS — D509 Iron deficiency anemia, unspecified: Secondary | ICD-10-CM | POA: Diagnosis not present

## 2015-12-23 DIAGNOSIS — Z992 Dependence on renal dialysis: Secondary | ICD-10-CM | POA: Diagnosis not present

## 2015-12-23 DIAGNOSIS — N186 End stage renal disease: Secondary | ICD-10-CM | POA: Diagnosis not present

## 2015-12-25 DIAGNOSIS — N186 End stage renal disease: Secondary | ICD-10-CM | POA: Diagnosis not present

## 2015-12-25 DIAGNOSIS — N2581 Secondary hyperparathyroidism of renal origin: Secondary | ICD-10-CM | POA: Diagnosis not present

## 2015-12-25 DIAGNOSIS — D509 Iron deficiency anemia, unspecified: Secondary | ICD-10-CM | POA: Diagnosis not present

## 2015-12-25 DIAGNOSIS — Z992 Dependence on renal dialysis: Secondary | ICD-10-CM | POA: Diagnosis not present

## 2015-12-28 DIAGNOSIS — Z992 Dependence on renal dialysis: Secondary | ICD-10-CM | POA: Diagnosis not present

## 2015-12-28 DIAGNOSIS — N186 End stage renal disease: Secondary | ICD-10-CM | POA: Diagnosis not present

## 2015-12-28 DIAGNOSIS — N2581 Secondary hyperparathyroidism of renal origin: Secondary | ICD-10-CM | POA: Diagnosis not present

## 2015-12-28 DIAGNOSIS — D509 Iron deficiency anemia, unspecified: Secondary | ICD-10-CM | POA: Diagnosis not present

## 2015-12-30 DIAGNOSIS — Z992 Dependence on renal dialysis: Secondary | ICD-10-CM | POA: Diagnosis not present

## 2015-12-30 DIAGNOSIS — N186 End stage renal disease: Secondary | ICD-10-CM | POA: Diagnosis not present

## 2015-12-30 DIAGNOSIS — D509 Iron deficiency anemia, unspecified: Secondary | ICD-10-CM | POA: Diagnosis not present

## 2015-12-30 DIAGNOSIS — N2581 Secondary hyperparathyroidism of renal origin: Secondary | ICD-10-CM | POA: Diagnosis not present

## 2016-01-01 DIAGNOSIS — N186 End stage renal disease: Secondary | ICD-10-CM | POA: Diagnosis not present

## 2016-01-01 DIAGNOSIS — Z992 Dependence on renal dialysis: Secondary | ICD-10-CM | POA: Diagnosis not present

## 2016-01-01 DIAGNOSIS — N2581 Secondary hyperparathyroidism of renal origin: Secondary | ICD-10-CM | POA: Diagnosis not present

## 2016-01-01 DIAGNOSIS — D509 Iron deficiency anemia, unspecified: Secondary | ICD-10-CM | POA: Diagnosis not present

## 2016-01-04 DIAGNOSIS — Z89512 Acquired absence of left leg below knee: Secondary | ICD-10-CM | POA: Diagnosis not present

## 2016-01-04 DIAGNOSIS — I252 Old myocardial infarction: Secondary | ICD-10-CM | POA: Diagnosis not present

## 2016-01-04 DIAGNOSIS — I2510 Atherosclerotic heart disease of native coronary artery without angina pectoris: Secondary | ICD-10-CM | POA: Diagnosis not present

## 2016-01-04 DIAGNOSIS — Z992 Dependence on renal dialysis: Secondary | ICD-10-CM | POA: Diagnosis not present

## 2016-01-04 DIAGNOSIS — N186 End stage renal disease: Secondary | ICD-10-CM | POA: Diagnosis not present

## 2016-01-04 DIAGNOSIS — D509 Iron deficiency anemia, unspecified: Secondary | ICD-10-CM | POA: Diagnosis not present

## 2016-01-04 DIAGNOSIS — F17200 Nicotine dependence, unspecified, uncomplicated: Secondary | ICD-10-CM | POA: Diagnosis not present

## 2016-01-04 DIAGNOSIS — Z89511 Acquired absence of right leg below knee: Secondary | ICD-10-CM | POA: Diagnosis not present

## 2016-01-04 DIAGNOSIS — N2581 Secondary hyperparathyroidism of renal origin: Secondary | ICD-10-CM | POA: Diagnosis not present

## 2016-01-05 DIAGNOSIS — I2510 Atherosclerotic heart disease of native coronary artery without angina pectoris: Secondary | ICD-10-CM | POA: Diagnosis not present

## 2016-01-05 DIAGNOSIS — Z888 Allergy status to other drugs, medicaments and biological substances status: Secondary | ICD-10-CM | POA: Diagnosis not present

## 2016-01-05 DIAGNOSIS — Z89511 Acquired absence of right leg below knee: Secondary | ICD-10-CM | POA: Diagnosis not present

## 2016-01-05 DIAGNOSIS — C73 Malignant neoplasm of thyroid gland: Secondary | ICD-10-CM | POA: Diagnosis not present

## 2016-01-05 DIAGNOSIS — Z955 Presence of coronary angioplasty implant and graft: Secondary | ICD-10-CM | POA: Diagnosis not present

## 2016-01-05 DIAGNOSIS — Z89512 Acquired absence of left leg below knee: Secondary | ICD-10-CM | POA: Diagnosis not present

## 2016-01-05 DIAGNOSIS — Z79899 Other long term (current) drug therapy: Secondary | ICD-10-CM | POA: Diagnosis not present

## 2016-01-05 DIAGNOSIS — Z79891 Long term (current) use of opiate analgesic: Secondary | ICD-10-CM | POA: Diagnosis not present

## 2016-01-05 DIAGNOSIS — Z886 Allergy status to analgesic agent status: Secondary | ICD-10-CM | POA: Diagnosis not present

## 2016-01-05 DIAGNOSIS — I1311 Hypertensive heart and chronic kidney disease without heart failure, with stage 5 chronic kidney disease, or end stage renal disease: Secondary | ICD-10-CM | POA: Diagnosis not present

## 2016-01-05 DIAGNOSIS — E1122 Type 2 diabetes mellitus with diabetic chronic kidney disease: Secondary | ICD-10-CM | POA: Diagnosis not present

## 2016-01-05 DIAGNOSIS — Z794 Long term (current) use of insulin: Secondary | ICD-10-CM | POA: Diagnosis not present

## 2016-01-05 DIAGNOSIS — Z992 Dependence on renal dialysis: Secondary | ICD-10-CM | POA: Diagnosis not present

## 2016-01-05 DIAGNOSIS — E7800 Pure hypercholesterolemia, unspecified: Secondary | ICD-10-CM | POA: Diagnosis not present

## 2016-01-05 DIAGNOSIS — E042 Nontoxic multinodular goiter: Secondary | ICD-10-CM | POA: Diagnosis not present

## 2016-01-05 DIAGNOSIS — F17210 Nicotine dependence, cigarettes, uncomplicated: Secondary | ICD-10-CM | POA: Diagnosis not present

## 2016-01-05 DIAGNOSIS — N186 End stage renal disease: Secondary | ICD-10-CM | POA: Diagnosis not present

## 2016-01-05 DIAGNOSIS — I252 Old myocardial infarction: Secondary | ICD-10-CM | POA: Diagnosis not present

## 2016-01-05 DIAGNOSIS — E1151 Type 2 diabetes mellitus with diabetic peripheral angiopathy without gangrene: Secondary | ICD-10-CM | POA: Diagnosis not present

## 2016-01-05 DIAGNOSIS — Z7982 Long term (current) use of aspirin: Secondary | ICD-10-CM | POA: Diagnosis not present

## 2016-01-05 DIAGNOSIS — Z7902 Long term (current) use of antithrombotics/antiplatelets: Secondary | ICD-10-CM | POA: Diagnosis not present

## 2016-01-06 DIAGNOSIS — N2581 Secondary hyperparathyroidism of renal origin: Secondary | ICD-10-CM | POA: Diagnosis not present

## 2016-01-06 DIAGNOSIS — Z992 Dependence on renal dialysis: Secondary | ICD-10-CM | POA: Diagnosis not present

## 2016-01-06 DIAGNOSIS — D509 Iron deficiency anemia, unspecified: Secondary | ICD-10-CM | POA: Diagnosis not present

## 2016-01-06 DIAGNOSIS — N186 End stage renal disease: Secondary | ICD-10-CM | POA: Diagnosis not present

## 2016-01-08 DIAGNOSIS — N186 End stage renal disease: Secondary | ICD-10-CM | POA: Diagnosis not present

## 2016-01-08 DIAGNOSIS — D509 Iron deficiency anemia, unspecified: Secondary | ICD-10-CM | POA: Diagnosis not present

## 2016-01-08 DIAGNOSIS — N2581 Secondary hyperparathyroidism of renal origin: Secondary | ICD-10-CM | POA: Diagnosis not present

## 2016-01-08 DIAGNOSIS — Z992 Dependence on renal dialysis: Secondary | ICD-10-CM | POA: Diagnosis not present

## 2016-01-11 DIAGNOSIS — N2581 Secondary hyperparathyroidism of renal origin: Secondary | ICD-10-CM | POA: Diagnosis not present

## 2016-01-11 DIAGNOSIS — Z794 Long term (current) use of insulin: Secondary | ICD-10-CM | POA: Diagnosis not present

## 2016-01-11 DIAGNOSIS — Z992 Dependence on renal dialysis: Secondary | ICD-10-CM | POA: Diagnosis not present

## 2016-01-11 DIAGNOSIS — E119 Type 2 diabetes mellitus without complications: Secondary | ICD-10-CM | POA: Diagnosis not present

## 2016-01-11 DIAGNOSIS — D509 Iron deficiency anemia, unspecified: Secondary | ICD-10-CM | POA: Diagnosis not present

## 2016-01-11 DIAGNOSIS — N186 End stage renal disease: Secondary | ICD-10-CM | POA: Diagnosis not present

## 2016-01-12 DIAGNOSIS — N186 End stage renal disease: Secondary | ICD-10-CM | POA: Diagnosis not present

## 2016-01-12 DIAGNOSIS — E042 Nontoxic multinodular goiter: Secondary | ICD-10-CM | POA: Diagnosis not present

## 2016-01-12 DIAGNOSIS — C73 Malignant neoplasm of thyroid gland: Secondary | ICD-10-CM | POA: Diagnosis not present

## 2016-01-12 DIAGNOSIS — E1151 Type 2 diabetes mellitus with diabetic peripheral angiopathy without gangrene: Secondary | ICD-10-CM | POA: Diagnosis not present

## 2016-01-12 DIAGNOSIS — E1122 Type 2 diabetes mellitus with diabetic chronic kidney disease: Secondary | ICD-10-CM | POA: Diagnosis not present

## 2016-01-12 DIAGNOSIS — I1311 Hypertensive heart and chronic kidney disease without heart failure, with stage 5 chronic kidney disease, or end stage renal disease: Secondary | ICD-10-CM | POA: Diagnosis not present

## 2016-01-12 DIAGNOSIS — Z992 Dependence on renal dialysis: Secondary | ICD-10-CM | POA: Diagnosis not present

## 2016-01-13 DIAGNOSIS — C73 Malignant neoplasm of thyroid gland: Secondary | ICD-10-CM | POA: Diagnosis not present

## 2016-01-13 DIAGNOSIS — E1122 Type 2 diabetes mellitus with diabetic chronic kidney disease: Secondary | ICD-10-CM | POA: Diagnosis not present

## 2016-01-13 DIAGNOSIS — E1151 Type 2 diabetes mellitus with diabetic peripheral angiopathy without gangrene: Secondary | ICD-10-CM | POA: Diagnosis not present

## 2016-01-13 DIAGNOSIS — N186 End stage renal disease: Secondary | ICD-10-CM | POA: Diagnosis not present

## 2016-01-13 DIAGNOSIS — Z992 Dependence on renal dialysis: Secondary | ICD-10-CM | POA: Diagnosis not present

## 2016-01-13 DIAGNOSIS — I1311 Hypertensive heart and chronic kidney disease without heart failure, with stage 5 chronic kidney disease, or end stage renal disease: Secondary | ICD-10-CM | POA: Diagnosis not present

## 2016-01-13 DIAGNOSIS — E042 Nontoxic multinodular goiter: Secondary | ICD-10-CM | POA: Diagnosis not present

## 2016-01-14 DIAGNOSIS — E1151 Type 2 diabetes mellitus with diabetic peripheral angiopathy without gangrene: Secondary | ICD-10-CM | POA: Diagnosis not present

## 2016-01-14 DIAGNOSIS — E042 Nontoxic multinodular goiter: Secondary | ICD-10-CM | POA: Diagnosis not present

## 2016-01-14 DIAGNOSIS — I1311 Hypertensive heart and chronic kidney disease without heart failure, with stage 5 chronic kidney disease, or end stage renal disease: Secondary | ICD-10-CM | POA: Diagnosis not present

## 2016-01-14 DIAGNOSIS — C73 Malignant neoplasm of thyroid gland: Secondary | ICD-10-CM | POA: Diagnosis not present

## 2016-01-14 DIAGNOSIS — N186 End stage renal disease: Secondary | ICD-10-CM | POA: Diagnosis not present

## 2016-01-14 DIAGNOSIS — E1122 Type 2 diabetes mellitus with diabetic chronic kidney disease: Secondary | ICD-10-CM | POA: Diagnosis not present

## 2016-01-14 DIAGNOSIS — Z992 Dependence on renal dialysis: Secondary | ICD-10-CM | POA: Diagnosis not present

## 2016-01-15 DIAGNOSIS — E1151 Type 2 diabetes mellitus with diabetic peripheral angiopathy without gangrene: Secondary | ICD-10-CM | POA: Diagnosis not present

## 2016-01-15 DIAGNOSIS — E1122 Type 2 diabetes mellitus with diabetic chronic kidney disease: Secondary | ICD-10-CM | POA: Diagnosis not present

## 2016-01-15 DIAGNOSIS — C73 Malignant neoplasm of thyroid gland: Secondary | ICD-10-CM | POA: Diagnosis not present

## 2016-01-15 DIAGNOSIS — Z992 Dependence on renal dialysis: Secondary | ICD-10-CM | POA: Diagnosis not present

## 2016-01-15 DIAGNOSIS — I1311 Hypertensive heart and chronic kidney disease without heart failure, with stage 5 chronic kidney disease, or end stage renal disease: Secondary | ICD-10-CM | POA: Diagnosis not present

## 2016-01-15 DIAGNOSIS — E042 Nontoxic multinodular goiter: Secondary | ICD-10-CM | POA: Diagnosis not present

## 2016-01-15 DIAGNOSIS — N186 End stage renal disease: Secondary | ICD-10-CM | POA: Diagnosis not present

## 2016-01-18 DIAGNOSIS — N2581 Secondary hyperparathyroidism of renal origin: Secondary | ICD-10-CM | POA: Diagnosis not present

## 2016-01-18 DIAGNOSIS — Z992 Dependence on renal dialysis: Secondary | ICD-10-CM | POA: Diagnosis not present

## 2016-01-18 DIAGNOSIS — N186 End stage renal disease: Secondary | ICD-10-CM | POA: Diagnosis not present

## 2016-01-18 DIAGNOSIS — D509 Iron deficiency anemia, unspecified: Secondary | ICD-10-CM | POA: Diagnosis not present

## 2016-01-19 DIAGNOSIS — M79651 Pain in right thigh: Secondary | ICD-10-CM | POA: Diagnosis not present

## 2016-01-19 DIAGNOSIS — M79642 Pain in left hand: Secondary | ICD-10-CM | POA: Diagnosis not present

## 2016-01-19 DIAGNOSIS — M79652 Pain in left thigh: Secondary | ICD-10-CM | POA: Diagnosis not present

## 2016-01-19 DIAGNOSIS — M25562 Pain in left knee: Secondary | ICD-10-CM | POA: Diagnosis not present

## 2016-01-19 DIAGNOSIS — G894 Chronic pain syndrome: Secondary | ICD-10-CM | POA: Diagnosis not present

## 2016-01-19 DIAGNOSIS — M5416 Radiculopathy, lumbar region: Secondary | ICD-10-CM | POA: Diagnosis not present

## 2016-01-19 DIAGNOSIS — M25561 Pain in right knee: Secondary | ICD-10-CM | POA: Diagnosis not present

## 2016-01-20 DIAGNOSIS — N2581 Secondary hyperparathyroidism of renal origin: Secondary | ICD-10-CM | POA: Diagnosis not present

## 2016-01-20 DIAGNOSIS — D509 Iron deficiency anemia, unspecified: Secondary | ICD-10-CM | POA: Diagnosis not present

## 2016-01-20 DIAGNOSIS — Z992 Dependence on renal dialysis: Secondary | ICD-10-CM | POA: Diagnosis not present

## 2016-01-20 DIAGNOSIS — N186 End stage renal disease: Secondary | ICD-10-CM | POA: Diagnosis not present

## 2016-01-22 DIAGNOSIS — N186 End stage renal disease: Secondary | ICD-10-CM | POA: Diagnosis not present

## 2016-01-22 DIAGNOSIS — Z992 Dependence on renal dialysis: Secondary | ICD-10-CM | POA: Diagnosis not present

## 2016-01-22 DIAGNOSIS — D509 Iron deficiency anemia, unspecified: Secondary | ICD-10-CM | POA: Diagnosis not present

## 2016-01-22 DIAGNOSIS — N2581 Secondary hyperparathyroidism of renal origin: Secondary | ICD-10-CM | POA: Diagnosis not present

## 2016-01-25 DIAGNOSIS — N2581 Secondary hyperparathyroidism of renal origin: Secondary | ICD-10-CM | POA: Diagnosis not present

## 2016-01-25 DIAGNOSIS — Z992 Dependence on renal dialysis: Secondary | ICD-10-CM | POA: Diagnosis not present

## 2016-01-25 DIAGNOSIS — D509 Iron deficiency anemia, unspecified: Secondary | ICD-10-CM | POA: Diagnosis not present

## 2016-01-25 DIAGNOSIS — N186 End stage renal disease: Secondary | ICD-10-CM | POA: Diagnosis not present

## 2016-01-27 DIAGNOSIS — E1142 Type 2 diabetes mellitus with diabetic polyneuropathy: Secondary | ICD-10-CM | POA: Diagnosis not present

## 2016-01-27 DIAGNOSIS — Z1389 Encounter for screening for other disorder: Secondary | ICD-10-CM | POA: Diagnosis not present

## 2016-01-27 DIAGNOSIS — I10 Essential (primary) hypertension: Secondary | ICD-10-CM | POA: Diagnosis not present

## 2016-01-27 DIAGNOSIS — Z0000 Encounter for general adult medical examination without abnormal findings: Secondary | ICD-10-CM | POA: Diagnosis not present

## 2016-01-27 DIAGNOSIS — J440 Chronic obstructive pulmonary disease with acute lower respiratory infection: Secondary | ICD-10-CM | POA: Diagnosis not present

## 2016-01-28 DIAGNOSIS — E8770 Fluid overload, unspecified: Secondary | ICD-10-CM | POA: Diagnosis not present

## 2016-01-28 DIAGNOSIS — N186 End stage renal disease: Secondary | ICD-10-CM | POA: Diagnosis not present

## 2016-01-28 DIAGNOSIS — Z992 Dependence on renal dialysis: Secondary | ICD-10-CM | POA: Diagnosis not present

## 2016-01-28 DIAGNOSIS — N2581 Secondary hyperparathyroidism of renal origin: Secondary | ICD-10-CM | POA: Diagnosis not present

## 2016-01-29 DIAGNOSIS — N186 End stage renal disease: Secondary | ICD-10-CM | POA: Diagnosis not present

## 2016-01-29 DIAGNOSIS — Z992 Dependence on renal dialysis: Secondary | ICD-10-CM | POA: Diagnosis not present

## 2016-01-29 DIAGNOSIS — D509 Iron deficiency anemia, unspecified: Secondary | ICD-10-CM | POA: Diagnosis not present

## 2016-01-29 DIAGNOSIS — N2581 Secondary hyperparathyroidism of renal origin: Secondary | ICD-10-CM | POA: Diagnosis not present

## 2016-02-01 DIAGNOSIS — D509 Iron deficiency anemia, unspecified: Secondary | ICD-10-CM | POA: Diagnosis not present

## 2016-02-01 DIAGNOSIS — N2581 Secondary hyperparathyroidism of renal origin: Secondary | ICD-10-CM | POA: Diagnosis not present

## 2016-02-01 DIAGNOSIS — Z992 Dependence on renal dialysis: Secondary | ICD-10-CM | POA: Diagnosis not present

## 2016-02-01 DIAGNOSIS — N186 End stage renal disease: Secondary | ICD-10-CM | POA: Diagnosis not present

## 2016-02-02 DIAGNOSIS — N186 End stage renal disease: Secondary | ICD-10-CM | POA: Diagnosis not present

## 2016-02-02 DIAGNOSIS — Z992 Dependence on renal dialysis: Secondary | ICD-10-CM | POA: Diagnosis not present

## 2016-02-02 DIAGNOSIS — N2581 Secondary hyperparathyroidism of renal origin: Secondary | ICD-10-CM | POA: Diagnosis not present

## 2016-02-02 DIAGNOSIS — D509 Iron deficiency anemia, unspecified: Secondary | ICD-10-CM | POA: Diagnosis not present

## 2016-02-03 DIAGNOSIS — M25561 Pain in right knee: Secondary | ICD-10-CM | POA: Diagnosis not present

## 2016-02-03 DIAGNOSIS — M79652 Pain in left thigh: Secondary | ICD-10-CM | POA: Diagnosis not present

## 2016-02-03 DIAGNOSIS — M25562 Pain in left knee: Secondary | ICD-10-CM | POA: Diagnosis not present

## 2016-02-03 DIAGNOSIS — G894 Chronic pain syndrome: Secondary | ICD-10-CM | POA: Diagnosis not present

## 2016-02-03 DIAGNOSIS — M79651 Pain in right thigh: Secondary | ICD-10-CM | POA: Diagnosis not present

## 2016-02-04 DIAGNOSIS — E890 Postprocedural hypothyroidism: Secondary | ICD-10-CM | POA: Diagnosis not present

## 2016-02-04 DIAGNOSIS — R5383 Other fatigue: Secondary | ICD-10-CM | POA: Diagnosis not present

## 2016-02-04 DIAGNOSIS — C73 Malignant neoplasm of thyroid gland: Secondary | ICD-10-CM | POA: Diagnosis not present

## 2016-02-05 DIAGNOSIS — N186 End stage renal disease: Secondary | ICD-10-CM | POA: Diagnosis not present

## 2016-02-05 DIAGNOSIS — N2581 Secondary hyperparathyroidism of renal origin: Secondary | ICD-10-CM | POA: Diagnosis not present

## 2016-02-05 DIAGNOSIS — Z992 Dependence on renal dialysis: Secondary | ICD-10-CM | POA: Diagnosis not present

## 2016-02-05 DIAGNOSIS — D509 Iron deficiency anemia, unspecified: Secondary | ICD-10-CM | POA: Diagnosis not present

## 2016-02-07 IMAGING — XA IR SHUNTOGRAM/ FISTULAGRAM
1 series · 13 of 23 positions shown · non-contrast
Comparison: none

CLINICAL DATA: Poor flow

PROCEDURE:
AV FISTULAGRAM
The right arm was prepped and draped in a sterile fashion. An 18
gauge Angiocath was inserted into the outflow vein. Contrast was
injected. The Angiocath was removed. Hemostasis was achieved with
direct pressure.

[Series 1: run · 13 of 23 slices shown]
[im 1/23]
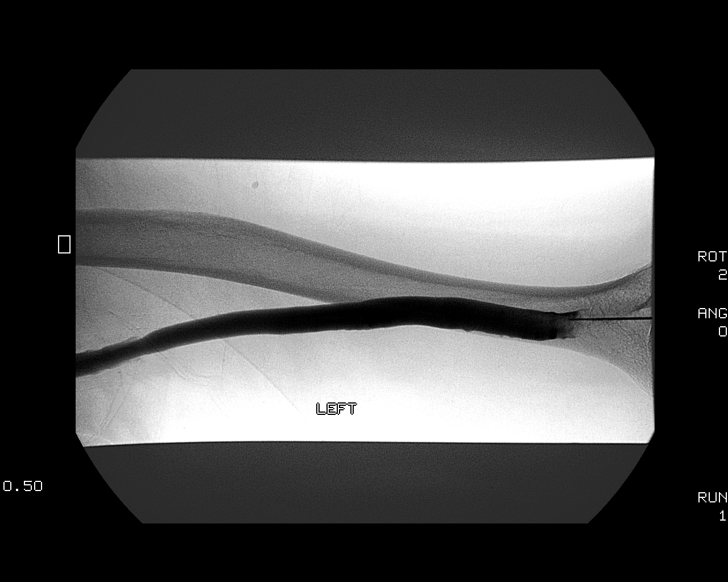
[im 3/23]
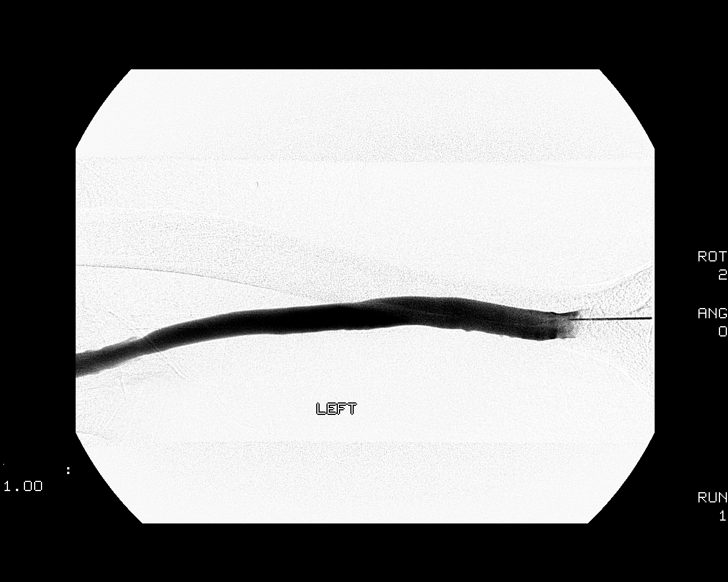
[im 5/23]
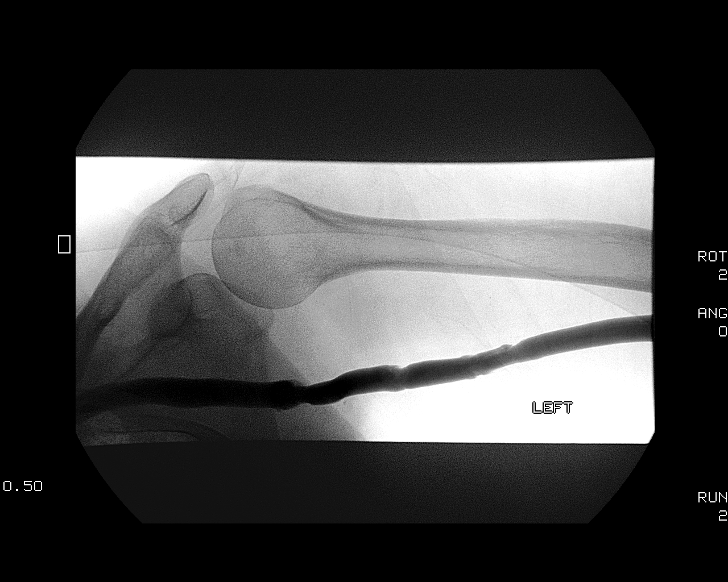
[im 7/23]
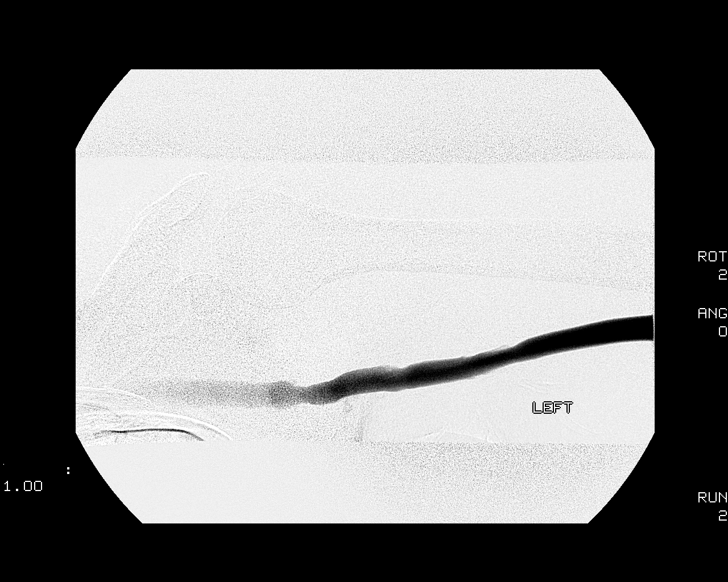
[im 8/23]
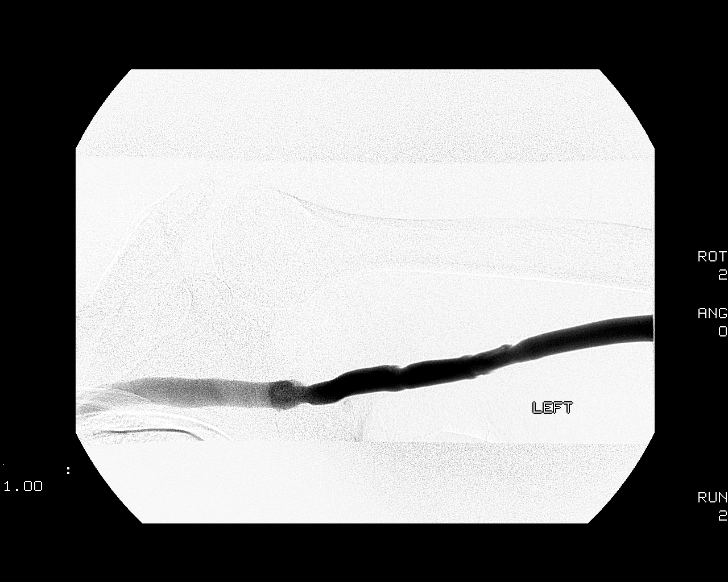
[im 10/23]
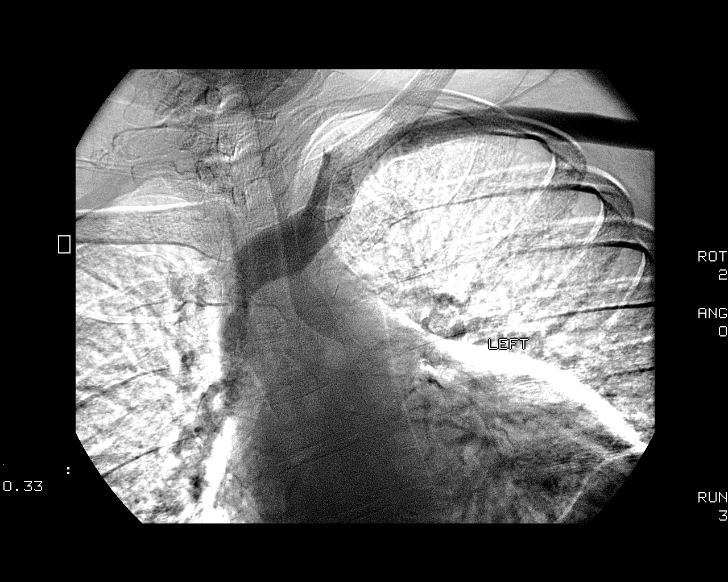
[im 12/23]
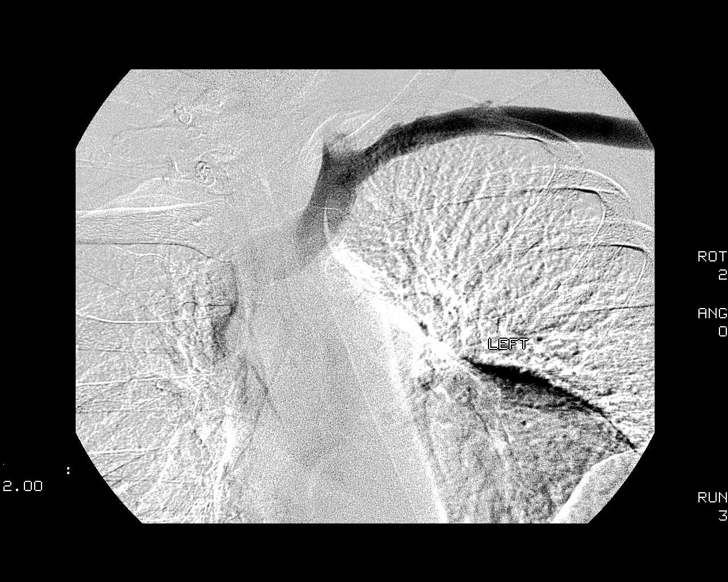
[im 14/23]
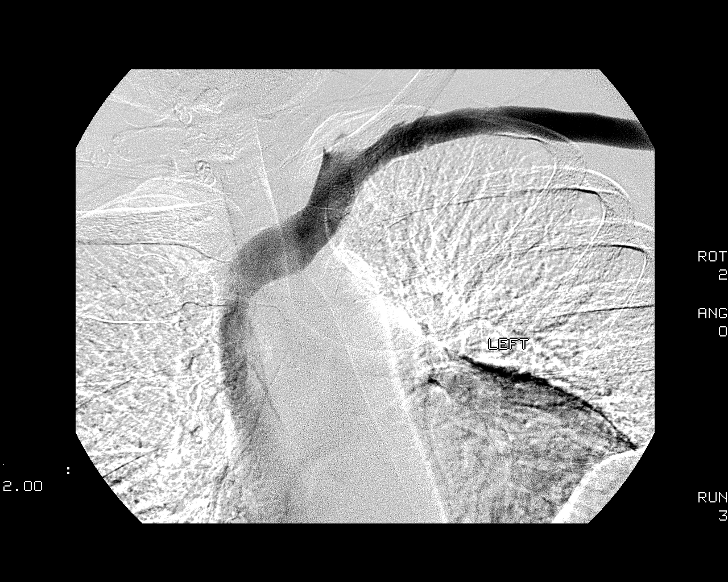
[im 16/23]
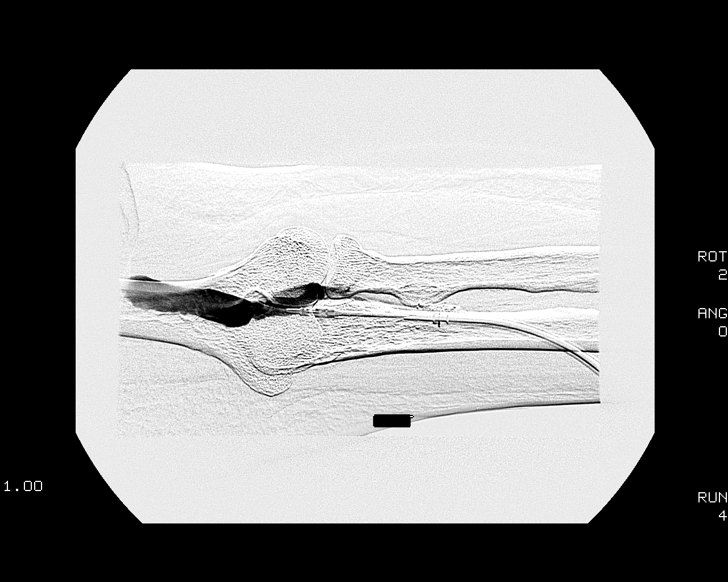
[im 17/23]
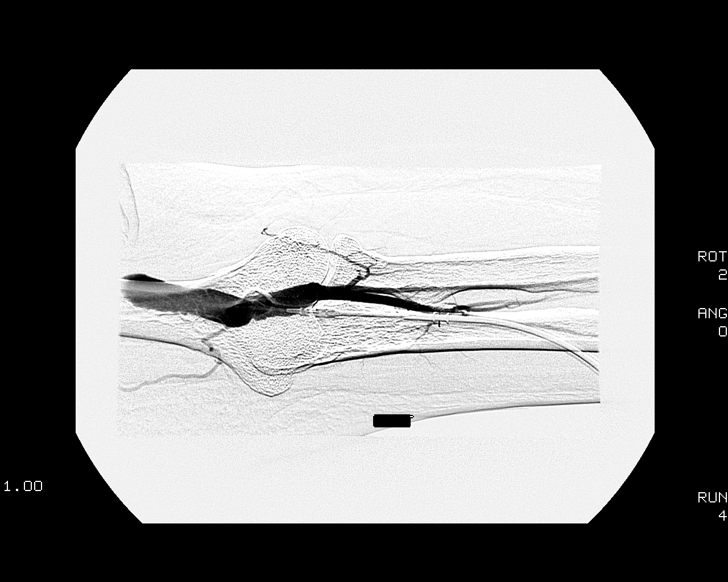
[im 19/23]
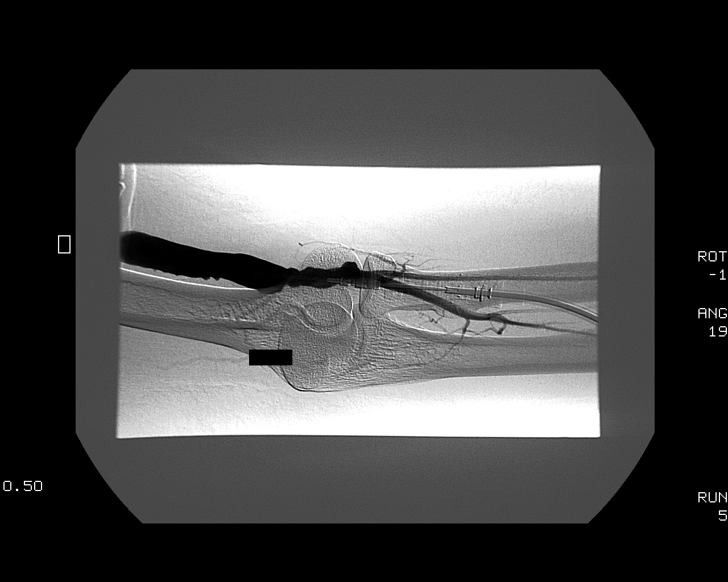
[im 21/23]
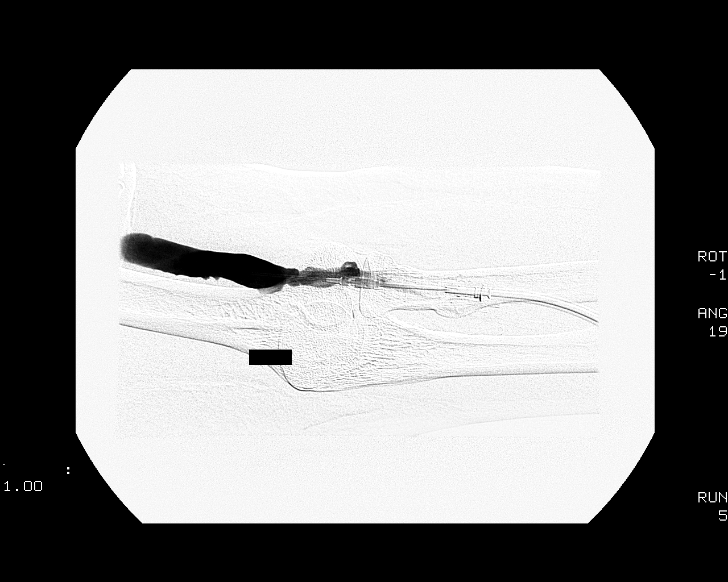
[im 23/23]
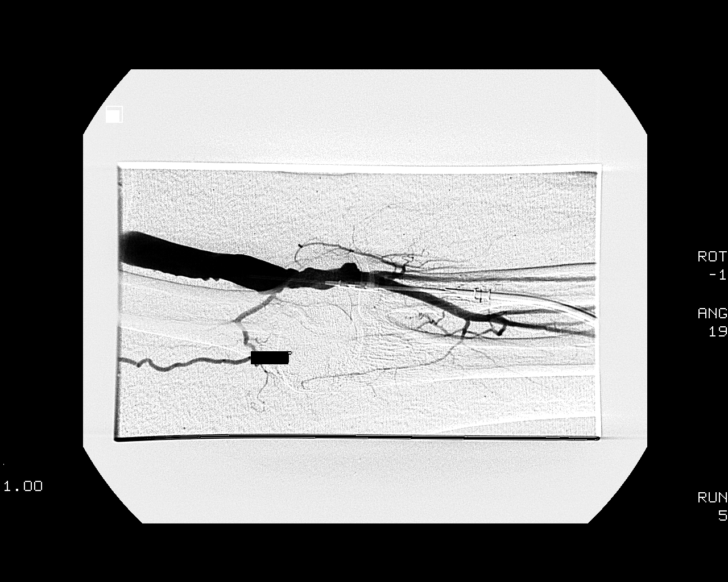

[13 of 23 positions shown; findings below may reference images not displayed]

FINDINGS: Arteriovenous anastomosis (at the antecubital fossa), outflow vein,
central venous structures are widely patent.
IMPRESSION: No significant stenosis in the left arm AV fistula circuit.

## 2016-02-08 DIAGNOSIS — Z992 Dependence on renal dialysis: Secondary | ICD-10-CM | POA: Diagnosis not present

## 2016-02-08 DIAGNOSIS — N2581 Secondary hyperparathyroidism of renal origin: Secondary | ICD-10-CM | POA: Diagnosis not present

## 2016-02-08 DIAGNOSIS — N186 End stage renal disease: Secondary | ICD-10-CM | POA: Diagnosis not present

## 2016-02-08 DIAGNOSIS — D509 Iron deficiency anemia, unspecified: Secondary | ICD-10-CM | POA: Diagnosis not present

## 2016-02-10 DIAGNOSIS — N2581 Secondary hyperparathyroidism of renal origin: Secondary | ICD-10-CM | POA: Diagnosis not present

## 2016-02-10 DIAGNOSIS — D509 Iron deficiency anemia, unspecified: Secondary | ICD-10-CM | POA: Diagnosis not present

## 2016-02-10 DIAGNOSIS — N186 End stage renal disease: Secondary | ICD-10-CM | POA: Diagnosis not present

## 2016-02-10 DIAGNOSIS — Z992 Dependence on renal dialysis: Secondary | ICD-10-CM | POA: Diagnosis not present

## 2016-02-12 DIAGNOSIS — N2581 Secondary hyperparathyroidism of renal origin: Secondary | ICD-10-CM | POA: Diagnosis not present

## 2016-02-12 DIAGNOSIS — D509 Iron deficiency anemia, unspecified: Secondary | ICD-10-CM | POA: Diagnosis not present

## 2016-02-12 DIAGNOSIS — N186 End stage renal disease: Secondary | ICD-10-CM | POA: Diagnosis not present

## 2016-02-12 DIAGNOSIS — Z992 Dependence on renal dialysis: Secondary | ICD-10-CM | POA: Diagnosis not present

## 2016-02-14 DIAGNOSIS — N186 End stage renal disease: Secondary | ICD-10-CM | POA: Diagnosis not present

## 2016-02-14 DIAGNOSIS — Z992 Dependence on renal dialysis: Secondary | ICD-10-CM | POA: Diagnosis not present

## 2016-02-15 DIAGNOSIS — G252 Other specified forms of tremor: Secondary | ICD-10-CM | POA: Diagnosis not present

## 2016-02-15 DIAGNOSIS — I2510 Atherosclerotic heart disease of native coronary artery without angina pectoris: Secondary | ICD-10-CM | POA: Diagnosis not present

## 2016-02-15 DIAGNOSIS — Z89512 Acquired absence of left leg below knee: Secondary | ICD-10-CM | POA: Diagnosis not present

## 2016-02-15 DIAGNOSIS — H539 Unspecified visual disturbance: Secondary | ICD-10-CM | POA: Diagnosis not present

## 2016-02-15 DIAGNOSIS — Z89511 Acquired absence of right leg below knee: Secondary | ICD-10-CM | POA: Diagnosis not present

## 2016-02-15 DIAGNOSIS — Z8679 Personal history of other diseases of the circulatory system: Secondary | ICD-10-CM | POA: Diagnosis not present

## 2016-02-15 DIAGNOSIS — Z992 Dependence on renal dialysis: Secondary | ICD-10-CM | POA: Diagnosis not present

## 2016-02-15 DIAGNOSIS — N186 End stage renal disease: Secondary | ICD-10-CM | POA: Diagnosis not present

## 2016-02-15 DIAGNOSIS — R251 Tremor, unspecified: Secondary | ICD-10-CM | POA: Diagnosis not present

## 2016-02-15 DIAGNOSIS — N2581 Secondary hyperparathyroidism of renal origin: Secondary | ICD-10-CM | POA: Diagnosis not present

## 2016-02-17 DIAGNOSIS — Z992 Dependence on renal dialysis: Secondary | ICD-10-CM | POA: Diagnosis not present

## 2016-02-17 DIAGNOSIS — N2581 Secondary hyperparathyroidism of renal origin: Secondary | ICD-10-CM | POA: Diagnosis not present

## 2016-02-17 DIAGNOSIS — N186 End stage renal disease: Secondary | ICD-10-CM | POA: Diagnosis not present

## 2016-02-20 DIAGNOSIS — N2581 Secondary hyperparathyroidism of renal origin: Secondary | ICD-10-CM | POA: Diagnosis not present

## 2016-02-20 DIAGNOSIS — Z992 Dependence on renal dialysis: Secondary | ICD-10-CM | POA: Diagnosis not present

## 2016-02-20 DIAGNOSIS — N186 End stage renal disease: Secondary | ICD-10-CM | POA: Diagnosis not present

## 2016-02-22 DIAGNOSIS — N2581 Secondary hyperparathyroidism of renal origin: Secondary | ICD-10-CM | POA: Diagnosis not present

## 2016-02-22 DIAGNOSIS — Z992 Dependence on renal dialysis: Secondary | ICD-10-CM | POA: Diagnosis not present

## 2016-02-22 DIAGNOSIS — N186 End stage renal disease: Secondary | ICD-10-CM | POA: Diagnosis not present

## 2016-02-24 DIAGNOSIS — N186 End stage renal disease: Secondary | ICD-10-CM | POA: Diagnosis not present

## 2016-02-24 DIAGNOSIS — Z992 Dependence on renal dialysis: Secondary | ICD-10-CM | POA: Diagnosis not present

## 2016-02-24 DIAGNOSIS — N2581 Secondary hyperparathyroidism of renal origin: Secondary | ICD-10-CM | POA: Diagnosis not present

## 2016-02-26 DIAGNOSIS — N186 End stage renal disease: Secondary | ICD-10-CM | POA: Diagnosis not present

## 2016-02-26 DIAGNOSIS — N2581 Secondary hyperparathyroidism of renal origin: Secondary | ICD-10-CM | POA: Diagnosis not present

## 2016-02-26 DIAGNOSIS — Z992 Dependence on renal dialysis: Secondary | ICD-10-CM | POA: Diagnosis not present

## 2016-02-29 DIAGNOSIS — Z992 Dependence on renal dialysis: Secondary | ICD-10-CM | POA: Diagnosis not present

## 2016-02-29 DIAGNOSIS — N186 End stage renal disease: Secondary | ICD-10-CM | POA: Diagnosis not present

## 2016-02-29 DIAGNOSIS — N2581 Secondary hyperparathyroidism of renal origin: Secondary | ICD-10-CM | POA: Diagnosis not present

## 2016-03-02 DIAGNOSIS — N2581 Secondary hyperparathyroidism of renal origin: Secondary | ICD-10-CM | POA: Diagnosis not present

## 2016-03-02 DIAGNOSIS — N186 End stage renal disease: Secondary | ICD-10-CM | POA: Diagnosis not present

## 2016-03-02 DIAGNOSIS — Z992 Dependence on renal dialysis: Secondary | ICD-10-CM | POA: Diagnosis not present

## 2016-03-03 DIAGNOSIS — M545 Low back pain: Secondary | ICD-10-CM | POA: Diagnosis not present

## 2016-03-03 DIAGNOSIS — M79651 Pain in right thigh: Secondary | ICD-10-CM | POA: Diagnosis not present

## 2016-03-03 DIAGNOSIS — M25561 Pain in right knee: Secondary | ICD-10-CM | POA: Diagnosis not present

## 2016-03-03 DIAGNOSIS — M79652 Pain in left thigh: Secondary | ICD-10-CM | POA: Diagnosis not present

## 2016-03-03 DIAGNOSIS — M25562 Pain in left knee: Secondary | ICD-10-CM | POA: Diagnosis not present

## 2016-03-03 DIAGNOSIS — G894 Chronic pain syndrome: Secondary | ICD-10-CM | POA: Diagnosis not present

## 2016-03-03 DIAGNOSIS — Z79891 Long term (current) use of opiate analgesic: Secondary | ICD-10-CM | POA: Diagnosis not present

## 2016-03-04 DIAGNOSIS — N186 End stage renal disease: Secondary | ICD-10-CM | POA: Diagnosis not present

## 2016-03-04 DIAGNOSIS — Z992 Dependence on renal dialysis: Secondary | ICD-10-CM | POA: Diagnosis not present

## 2016-03-04 DIAGNOSIS — N2581 Secondary hyperparathyroidism of renal origin: Secondary | ICD-10-CM | POA: Diagnosis not present

## 2016-03-07 DIAGNOSIS — N2581 Secondary hyperparathyroidism of renal origin: Secondary | ICD-10-CM | POA: Diagnosis not present

## 2016-03-07 DIAGNOSIS — N186 End stage renal disease: Secondary | ICD-10-CM | POA: Diagnosis not present

## 2016-03-07 DIAGNOSIS — Z992 Dependence on renal dialysis: Secondary | ICD-10-CM | POA: Diagnosis not present

## 2016-03-09 DIAGNOSIS — N2581 Secondary hyperparathyroidism of renal origin: Secondary | ICD-10-CM | POA: Diagnosis not present

## 2016-03-09 DIAGNOSIS — N186 End stage renal disease: Secondary | ICD-10-CM | POA: Diagnosis not present

## 2016-03-09 DIAGNOSIS — Z992 Dependence on renal dialysis: Secondary | ICD-10-CM | POA: Diagnosis not present

## 2016-03-11 DIAGNOSIS — N186 End stage renal disease: Secondary | ICD-10-CM | POA: Diagnosis not present

## 2016-03-11 DIAGNOSIS — N2581 Secondary hyperparathyroidism of renal origin: Secondary | ICD-10-CM | POA: Diagnosis not present

## 2016-03-11 DIAGNOSIS — Z992 Dependence on renal dialysis: Secondary | ICD-10-CM | POA: Diagnosis not present

## 2016-03-14 DIAGNOSIS — N186 End stage renal disease: Secondary | ICD-10-CM | POA: Diagnosis not present

## 2016-03-14 DIAGNOSIS — Z992 Dependence on renal dialysis: Secondary | ICD-10-CM | POA: Diagnosis not present

## 2016-03-14 DIAGNOSIS — N2581 Secondary hyperparathyroidism of renal origin: Secondary | ICD-10-CM | POA: Diagnosis not present

## 2016-03-16 DIAGNOSIS — N2581 Secondary hyperparathyroidism of renal origin: Secondary | ICD-10-CM | POA: Diagnosis not present

## 2016-03-16 DIAGNOSIS — N186 End stage renal disease: Secondary | ICD-10-CM | POA: Diagnosis not present

## 2016-03-16 DIAGNOSIS — Z992 Dependence on renal dialysis: Secondary | ICD-10-CM | POA: Diagnosis not present

## 2016-03-18 DIAGNOSIS — N186 End stage renal disease: Secondary | ICD-10-CM | POA: Diagnosis not present

## 2016-03-18 DIAGNOSIS — N2581 Secondary hyperparathyroidism of renal origin: Secondary | ICD-10-CM | POA: Diagnosis not present

## 2016-03-18 DIAGNOSIS — Z992 Dependence on renal dialysis: Secondary | ICD-10-CM | POA: Diagnosis not present

## 2016-03-18 DIAGNOSIS — D509 Iron deficiency anemia, unspecified: Secondary | ICD-10-CM | POA: Diagnosis not present

## 2016-03-22 DIAGNOSIS — N2581 Secondary hyperparathyroidism of renal origin: Secondary | ICD-10-CM | POA: Diagnosis not present

## 2016-03-22 DIAGNOSIS — D509 Iron deficiency anemia, unspecified: Secondary | ICD-10-CM | POA: Diagnosis not present

## 2016-03-22 DIAGNOSIS — Z992 Dependence on renal dialysis: Secondary | ICD-10-CM | POA: Diagnosis not present

## 2016-03-22 DIAGNOSIS — N186 End stage renal disease: Secondary | ICD-10-CM | POA: Diagnosis not present

## 2016-03-23 DIAGNOSIS — N186 End stage renal disease: Secondary | ICD-10-CM | POA: Diagnosis not present

## 2016-03-23 DIAGNOSIS — Z992 Dependence on renal dialysis: Secondary | ICD-10-CM | POA: Diagnosis not present

## 2016-03-23 DIAGNOSIS — N2581 Secondary hyperparathyroidism of renal origin: Secondary | ICD-10-CM | POA: Diagnosis not present

## 2016-03-23 DIAGNOSIS — D509 Iron deficiency anemia, unspecified: Secondary | ICD-10-CM | POA: Diagnosis not present

## 2016-03-25 DIAGNOSIS — N2581 Secondary hyperparathyroidism of renal origin: Secondary | ICD-10-CM | POA: Diagnosis not present

## 2016-03-25 DIAGNOSIS — D509 Iron deficiency anemia, unspecified: Secondary | ICD-10-CM | POA: Diagnosis not present

## 2016-03-25 DIAGNOSIS — Z992 Dependence on renal dialysis: Secondary | ICD-10-CM | POA: Diagnosis not present

## 2016-03-25 DIAGNOSIS — N186 End stage renal disease: Secondary | ICD-10-CM | POA: Diagnosis not present

## 2016-03-28 DIAGNOSIS — Z992 Dependence on renal dialysis: Secondary | ICD-10-CM | POA: Diagnosis not present

## 2016-03-28 DIAGNOSIS — D509 Iron deficiency anemia, unspecified: Secondary | ICD-10-CM | POA: Diagnosis not present

## 2016-03-28 DIAGNOSIS — N2581 Secondary hyperparathyroidism of renal origin: Secondary | ICD-10-CM | POA: Diagnosis not present

## 2016-03-28 DIAGNOSIS — N186 End stage renal disease: Secondary | ICD-10-CM | POA: Diagnosis not present

## 2016-03-30 DIAGNOSIS — N2581 Secondary hyperparathyroidism of renal origin: Secondary | ICD-10-CM | POA: Diagnosis not present

## 2016-03-30 DIAGNOSIS — N186 End stage renal disease: Secondary | ICD-10-CM | POA: Diagnosis not present

## 2016-03-30 DIAGNOSIS — Z992 Dependence on renal dialysis: Secondary | ICD-10-CM | POA: Diagnosis not present

## 2016-03-30 DIAGNOSIS — D509 Iron deficiency anemia, unspecified: Secondary | ICD-10-CM | POA: Diagnosis not present

## 2016-03-31 DIAGNOSIS — M25561 Pain in right knee: Secondary | ICD-10-CM | POA: Diagnosis not present

## 2016-03-31 DIAGNOSIS — Z79891 Long term (current) use of opiate analgesic: Secondary | ICD-10-CM | POA: Diagnosis not present

## 2016-03-31 DIAGNOSIS — M25562 Pain in left knee: Secondary | ICD-10-CM | POA: Diagnosis not present

## 2016-03-31 DIAGNOSIS — M79652 Pain in left thigh: Secondary | ICD-10-CM | POA: Diagnosis not present

## 2016-03-31 DIAGNOSIS — M79651 Pain in right thigh: Secondary | ICD-10-CM | POA: Diagnosis not present

## 2016-03-31 DIAGNOSIS — G894 Chronic pain syndrome: Secondary | ICD-10-CM | POA: Diagnosis not present

## 2016-03-31 DIAGNOSIS — Z89511 Acquired absence of right leg below knee: Secondary | ICD-10-CM | POA: Diagnosis not present

## 2016-03-31 DIAGNOSIS — M25551 Pain in right hip: Secondary | ICD-10-CM | POA: Diagnosis not present

## 2016-03-31 DIAGNOSIS — M25552 Pain in left hip: Secondary | ICD-10-CM | POA: Diagnosis not present

## 2016-04-01 DIAGNOSIS — N2581 Secondary hyperparathyroidism of renal origin: Secondary | ICD-10-CM | POA: Diagnosis not present

## 2016-04-01 DIAGNOSIS — D509 Iron deficiency anemia, unspecified: Secondary | ICD-10-CM | POA: Diagnosis not present

## 2016-04-01 DIAGNOSIS — N186 End stage renal disease: Secondary | ICD-10-CM | POA: Diagnosis not present

## 2016-04-01 DIAGNOSIS — Z992 Dependence on renal dialysis: Secondary | ICD-10-CM | POA: Diagnosis not present

## 2016-04-05 DIAGNOSIS — Z992 Dependence on renal dialysis: Secondary | ICD-10-CM | POA: Diagnosis not present

## 2016-04-05 DIAGNOSIS — D509 Iron deficiency anemia, unspecified: Secondary | ICD-10-CM | POA: Diagnosis not present

## 2016-04-05 DIAGNOSIS — N186 End stage renal disease: Secondary | ICD-10-CM | POA: Diagnosis not present

## 2016-04-05 DIAGNOSIS — N2581 Secondary hyperparathyroidism of renal origin: Secondary | ICD-10-CM | POA: Diagnosis not present

## 2016-04-06 DIAGNOSIS — N186 End stage renal disease: Secondary | ICD-10-CM | POA: Diagnosis not present

## 2016-04-06 DIAGNOSIS — Z992 Dependence on renal dialysis: Secondary | ICD-10-CM | POA: Diagnosis not present

## 2016-04-06 DIAGNOSIS — N2581 Secondary hyperparathyroidism of renal origin: Secondary | ICD-10-CM | POA: Diagnosis not present

## 2016-04-06 DIAGNOSIS — D509 Iron deficiency anemia, unspecified: Secondary | ICD-10-CM | POA: Diagnosis not present

## 2016-04-08 DIAGNOSIS — N2581 Secondary hyperparathyroidism of renal origin: Secondary | ICD-10-CM | POA: Diagnosis not present

## 2016-04-08 DIAGNOSIS — D509 Iron deficiency anemia, unspecified: Secondary | ICD-10-CM | POA: Diagnosis not present

## 2016-04-08 DIAGNOSIS — N186 End stage renal disease: Secondary | ICD-10-CM | POA: Diagnosis not present

## 2016-04-08 DIAGNOSIS — Z992 Dependence on renal dialysis: Secondary | ICD-10-CM | POA: Diagnosis not present

## 2016-04-11 DIAGNOSIS — N186 End stage renal disease: Secondary | ICD-10-CM | POA: Diagnosis not present

## 2016-04-11 DIAGNOSIS — Z992 Dependence on renal dialysis: Secondary | ICD-10-CM | POA: Diagnosis not present

## 2016-04-11 DIAGNOSIS — N2581 Secondary hyperparathyroidism of renal origin: Secondary | ICD-10-CM | POA: Diagnosis not present

## 2016-04-11 DIAGNOSIS — D509 Iron deficiency anemia, unspecified: Secondary | ICD-10-CM | POA: Diagnosis not present

## 2016-04-13 DIAGNOSIS — E119 Type 2 diabetes mellitus without complications: Secondary | ICD-10-CM | POA: Diagnosis not present

## 2016-04-13 DIAGNOSIS — D509 Iron deficiency anemia, unspecified: Secondary | ICD-10-CM | POA: Diagnosis not present

## 2016-04-13 DIAGNOSIS — Z794 Long term (current) use of insulin: Secondary | ICD-10-CM | POA: Diagnosis not present

## 2016-04-13 DIAGNOSIS — N2581 Secondary hyperparathyroidism of renal origin: Secondary | ICD-10-CM | POA: Diagnosis not present

## 2016-04-13 DIAGNOSIS — Z992 Dependence on renal dialysis: Secondary | ICD-10-CM | POA: Diagnosis not present

## 2016-04-13 DIAGNOSIS — N186 End stage renal disease: Secondary | ICD-10-CM | POA: Diagnosis not present

## 2016-04-15 DIAGNOSIS — Z992 Dependence on renal dialysis: Secondary | ICD-10-CM | POA: Diagnosis not present

## 2016-04-15 DIAGNOSIS — D509 Iron deficiency anemia, unspecified: Secondary | ICD-10-CM | POA: Diagnosis not present

## 2016-04-15 DIAGNOSIS — N2581 Secondary hyperparathyroidism of renal origin: Secondary | ICD-10-CM | POA: Diagnosis not present

## 2016-04-15 DIAGNOSIS — N186 End stage renal disease: Secondary | ICD-10-CM | POA: Diagnosis not present

## 2016-04-18 DIAGNOSIS — D509 Iron deficiency anemia, unspecified: Secondary | ICD-10-CM | POA: Diagnosis not present

## 2016-04-18 DIAGNOSIS — Z992 Dependence on renal dialysis: Secondary | ICD-10-CM | POA: Diagnosis not present

## 2016-04-18 DIAGNOSIS — R509 Fever, unspecified: Secondary | ICD-10-CM | POA: Diagnosis not present

## 2016-04-18 DIAGNOSIS — N186 End stage renal disease: Secondary | ICD-10-CM | POA: Diagnosis not present

## 2016-04-18 DIAGNOSIS — N2581 Secondary hyperparathyroidism of renal origin: Secondary | ICD-10-CM | POA: Diagnosis not present

## 2016-04-18 DIAGNOSIS — R229 Localized swelling, mass and lump, unspecified: Secondary | ICD-10-CM | POA: Diagnosis not present

## 2016-04-20 DIAGNOSIS — R509 Fever, unspecified: Secondary | ICD-10-CM | POA: Diagnosis not present

## 2016-04-20 DIAGNOSIS — N186 End stage renal disease: Secondary | ICD-10-CM | POA: Diagnosis not present

## 2016-04-20 DIAGNOSIS — R229 Localized swelling, mass and lump, unspecified: Secondary | ICD-10-CM | POA: Diagnosis not present

## 2016-04-20 DIAGNOSIS — N2581 Secondary hyperparathyroidism of renal origin: Secondary | ICD-10-CM | POA: Diagnosis not present

## 2016-04-20 DIAGNOSIS — Z992 Dependence on renal dialysis: Secondary | ICD-10-CM | POA: Diagnosis not present

## 2016-04-20 DIAGNOSIS — D509 Iron deficiency anemia, unspecified: Secondary | ICD-10-CM | POA: Diagnosis not present

## 2016-04-22 DIAGNOSIS — D509 Iron deficiency anemia, unspecified: Secondary | ICD-10-CM | POA: Diagnosis not present

## 2016-04-22 DIAGNOSIS — R229 Localized swelling, mass and lump, unspecified: Secondary | ICD-10-CM | POA: Diagnosis not present

## 2016-04-22 DIAGNOSIS — N186 End stage renal disease: Secondary | ICD-10-CM | POA: Diagnosis not present

## 2016-04-22 DIAGNOSIS — R509 Fever, unspecified: Secondary | ICD-10-CM | POA: Diagnosis not present

## 2016-04-22 DIAGNOSIS — N2581 Secondary hyperparathyroidism of renal origin: Secondary | ICD-10-CM | POA: Diagnosis not present

## 2016-04-22 DIAGNOSIS — Z992 Dependence on renal dialysis: Secondary | ICD-10-CM | POA: Diagnosis not present

## 2016-04-25 DIAGNOSIS — D509 Iron deficiency anemia, unspecified: Secondary | ICD-10-CM | POA: Diagnosis not present

## 2016-04-25 DIAGNOSIS — N2581 Secondary hyperparathyroidism of renal origin: Secondary | ICD-10-CM | POA: Diagnosis not present

## 2016-04-25 DIAGNOSIS — R229 Localized swelling, mass and lump, unspecified: Secondary | ICD-10-CM | POA: Diagnosis not present

## 2016-04-25 DIAGNOSIS — R509 Fever, unspecified: Secondary | ICD-10-CM | POA: Diagnosis not present

## 2016-04-25 DIAGNOSIS — Z992 Dependence on renal dialysis: Secondary | ICD-10-CM | POA: Diagnosis not present

## 2016-04-25 DIAGNOSIS — N186 End stage renal disease: Secondary | ICD-10-CM | POA: Diagnosis not present

## 2016-04-26 DIAGNOSIS — L02414 Cutaneous abscess of left upper limb: Secondary | ICD-10-CM | POA: Diagnosis not present

## 2016-04-27 DIAGNOSIS — R509 Fever, unspecified: Secondary | ICD-10-CM | POA: Diagnosis not present

## 2016-04-27 DIAGNOSIS — N186 End stage renal disease: Secondary | ICD-10-CM | POA: Diagnosis not present

## 2016-04-27 DIAGNOSIS — D509 Iron deficiency anemia, unspecified: Secondary | ICD-10-CM | POA: Diagnosis not present

## 2016-04-27 DIAGNOSIS — N2581 Secondary hyperparathyroidism of renal origin: Secondary | ICD-10-CM | POA: Diagnosis not present

## 2016-04-27 DIAGNOSIS — R229 Localized swelling, mass and lump, unspecified: Secondary | ICD-10-CM | POA: Diagnosis not present

## 2016-04-27 DIAGNOSIS — Z992 Dependence on renal dialysis: Secondary | ICD-10-CM | POA: Diagnosis not present

## 2016-04-29 DIAGNOSIS — Z992 Dependence on renal dialysis: Secondary | ICD-10-CM | POA: Diagnosis not present

## 2016-04-29 DIAGNOSIS — N186 End stage renal disease: Secondary | ICD-10-CM | POA: Diagnosis not present

## 2016-04-29 DIAGNOSIS — R229 Localized swelling, mass and lump, unspecified: Secondary | ICD-10-CM | POA: Diagnosis not present

## 2016-04-29 DIAGNOSIS — R509 Fever, unspecified: Secondary | ICD-10-CM | POA: Diagnosis not present

## 2016-04-29 DIAGNOSIS — D509 Iron deficiency anemia, unspecified: Secondary | ICD-10-CM | POA: Diagnosis not present

## 2016-04-29 DIAGNOSIS — N2581 Secondary hyperparathyroidism of renal origin: Secondary | ICD-10-CM | POA: Diagnosis not present

## 2016-05-02 DIAGNOSIS — N186 End stage renal disease: Secondary | ICD-10-CM | POA: Diagnosis not present

## 2016-05-02 DIAGNOSIS — R509 Fever, unspecified: Secondary | ICD-10-CM | POA: Diagnosis not present

## 2016-05-02 DIAGNOSIS — D509 Iron deficiency anemia, unspecified: Secondary | ICD-10-CM | POA: Diagnosis not present

## 2016-05-02 DIAGNOSIS — R229 Localized swelling, mass and lump, unspecified: Secondary | ICD-10-CM | POA: Diagnosis not present

## 2016-05-02 DIAGNOSIS — N2581 Secondary hyperparathyroidism of renal origin: Secondary | ICD-10-CM | POA: Diagnosis not present

## 2016-05-02 DIAGNOSIS — Z992 Dependence on renal dialysis: Secondary | ICD-10-CM | POA: Diagnosis not present

## 2016-05-04 DIAGNOSIS — Z992 Dependence on renal dialysis: Secondary | ICD-10-CM | POA: Diagnosis not present

## 2016-05-04 DIAGNOSIS — N186 End stage renal disease: Secondary | ICD-10-CM | POA: Diagnosis not present

## 2016-05-04 DIAGNOSIS — R229 Localized swelling, mass and lump, unspecified: Secondary | ICD-10-CM | POA: Diagnosis not present

## 2016-05-04 DIAGNOSIS — D509 Iron deficiency anemia, unspecified: Secondary | ICD-10-CM | POA: Diagnosis not present

## 2016-05-04 DIAGNOSIS — N2581 Secondary hyperparathyroidism of renal origin: Secondary | ICD-10-CM | POA: Diagnosis not present

## 2016-05-04 DIAGNOSIS — R509 Fever, unspecified: Secondary | ICD-10-CM | POA: Diagnosis not present

## 2016-05-05 DIAGNOSIS — M79651 Pain in right thigh: Secondary | ICD-10-CM | POA: Diagnosis not present

## 2016-05-05 DIAGNOSIS — E1142 Type 2 diabetes mellitus with diabetic polyneuropathy: Secondary | ICD-10-CM | POA: Diagnosis not present

## 2016-05-05 DIAGNOSIS — M25562 Pain in left knee: Secondary | ICD-10-CM | POA: Diagnosis not present

## 2016-05-05 DIAGNOSIS — G894 Chronic pain syndrome: Secondary | ICD-10-CM | POA: Diagnosis not present

## 2016-05-05 DIAGNOSIS — M25561 Pain in right knee: Secondary | ICD-10-CM | POA: Diagnosis not present

## 2016-05-05 DIAGNOSIS — N185 Chronic kidney disease, stage 5: Secondary | ICD-10-CM | POA: Diagnosis not present

## 2016-05-05 DIAGNOSIS — Z79891 Long term (current) use of opiate analgesic: Secondary | ICD-10-CM | POA: Diagnosis not present

## 2016-05-05 DIAGNOSIS — M79652 Pain in left thigh: Secondary | ICD-10-CM | POA: Diagnosis not present

## 2016-05-05 DIAGNOSIS — I10 Essential (primary) hypertension: Secondary | ICD-10-CM | POA: Diagnosis not present

## 2016-05-05 DIAGNOSIS — I25118 Atherosclerotic heart disease of native coronary artery with other forms of angina pectoris: Secondary | ICD-10-CM | POA: Diagnosis not present

## 2016-05-06 DIAGNOSIS — N2581 Secondary hyperparathyroidism of renal origin: Secondary | ICD-10-CM | POA: Diagnosis not present

## 2016-05-06 DIAGNOSIS — R509 Fever, unspecified: Secondary | ICD-10-CM | POA: Diagnosis not present

## 2016-05-06 DIAGNOSIS — R229 Localized swelling, mass and lump, unspecified: Secondary | ICD-10-CM | POA: Diagnosis not present

## 2016-05-06 DIAGNOSIS — Z992 Dependence on renal dialysis: Secondary | ICD-10-CM | POA: Diagnosis not present

## 2016-05-06 DIAGNOSIS — D509 Iron deficiency anemia, unspecified: Secondary | ICD-10-CM | POA: Diagnosis not present

## 2016-05-06 DIAGNOSIS — N186 End stage renal disease: Secondary | ICD-10-CM | POA: Diagnosis not present

## 2016-05-10 DIAGNOSIS — R509 Fever, unspecified: Secondary | ICD-10-CM | POA: Diagnosis not present

## 2016-05-10 DIAGNOSIS — N2581 Secondary hyperparathyroidism of renal origin: Secondary | ICD-10-CM | POA: Diagnosis not present

## 2016-05-10 DIAGNOSIS — N186 End stage renal disease: Secondary | ICD-10-CM | POA: Diagnosis not present

## 2016-05-10 DIAGNOSIS — R229 Localized swelling, mass and lump, unspecified: Secondary | ICD-10-CM | POA: Diagnosis not present

## 2016-05-10 DIAGNOSIS — D509 Iron deficiency anemia, unspecified: Secondary | ICD-10-CM | POA: Diagnosis not present

## 2016-05-10 DIAGNOSIS — Z992 Dependence on renal dialysis: Secondary | ICD-10-CM | POA: Diagnosis not present

## 2016-05-11 DIAGNOSIS — D509 Iron deficiency anemia, unspecified: Secondary | ICD-10-CM | POA: Diagnosis not present

## 2016-05-11 DIAGNOSIS — N186 End stage renal disease: Secondary | ICD-10-CM | POA: Diagnosis not present

## 2016-05-11 DIAGNOSIS — Z992 Dependence on renal dialysis: Secondary | ICD-10-CM | POA: Diagnosis not present

## 2016-05-11 DIAGNOSIS — N2581 Secondary hyperparathyroidism of renal origin: Secondary | ICD-10-CM | POA: Diagnosis not present

## 2016-05-11 DIAGNOSIS — R229 Localized swelling, mass and lump, unspecified: Secondary | ICD-10-CM | POA: Diagnosis not present

## 2016-05-11 DIAGNOSIS — R509 Fever, unspecified: Secondary | ICD-10-CM | POA: Diagnosis not present

## 2016-05-13 DIAGNOSIS — D509 Iron deficiency anemia, unspecified: Secondary | ICD-10-CM | POA: Diagnosis not present

## 2016-05-13 DIAGNOSIS — Z992 Dependence on renal dialysis: Secondary | ICD-10-CM | POA: Diagnosis not present

## 2016-05-13 DIAGNOSIS — N2581 Secondary hyperparathyroidism of renal origin: Secondary | ICD-10-CM | POA: Diagnosis not present

## 2016-05-13 DIAGNOSIS — R229 Localized swelling, mass and lump, unspecified: Secondary | ICD-10-CM | POA: Diagnosis not present

## 2016-05-13 DIAGNOSIS — N186 End stage renal disease: Secondary | ICD-10-CM | POA: Diagnosis not present

## 2016-05-13 DIAGNOSIS — R509 Fever, unspecified: Secondary | ICD-10-CM | POA: Diagnosis not present

## 2016-05-16 DIAGNOSIS — N2581 Secondary hyperparathyroidism of renal origin: Secondary | ICD-10-CM | POA: Diagnosis not present

## 2016-05-16 DIAGNOSIS — D509 Iron deficiency anemia, unspecified: Secondary | ICD-10-CM | POA: Diagnosis not present

## 2016-05-16 DIAGNOSIS — Z992 Dependence on renal dialysis: Secondary | ICD-10-CM | POA: Diagnosis not present

## 2016-05-16 DIAGNOSIS — N186 End stage renal disease: Secondary | ICD-10-CM | POA: Diagnosis not present

## 2016-05-16 DIAGNOSIS — R509 Fever, unspecified: Secondary | ICD-10-CM | POA: Diagnosis not present

## 2016-05-16 DIAGNOSIS — R229 Localized swelling, mass and lump, unspecified: Secondary | ICD-10-CM | POA: Diagnosis not present

## 2016-05-17 DIAGNOSIS — N185 Chronic kidney disease, stage 5: Secondary | ICD-10-CM | POA: Diagnosis not present

## 2016-05-17 DIAGNOSIS — I25118 Atherosclerotic heart disease of native coronary artery with other forms of angina pectoris: Secondary | ICD-10-CM | POA: Diagnosis not present

## 2016-05-17 DIAGNOSIS — E1142 Type 2 diabetes mellitus with diabetic polyneuropathy: Secondary | ICD-10-CM | POA: Diagnosis not present

## 2016-05-17 DIAGNOSIS — I10 Essential (primary) hypertension: Secondary | ICD-10-CM | POA: Diagnosis not present

## 2016-05-18 DIAGNOSIS — N2581 Secondary hyperparathyroidism of renal origin: Secondary | ICD-10-CM | POA: Diagnosis not present

## 2016-05-18 DIAGNOSIS — Z992 Dependence on renal dialysis: Secondary | ICD-10-CM | POA: Diagnosis not present

## 2016-05-18 DIAGNOSIS — D509 Iron deficiency anemia, unspecified: Secondary | ICD-10-CM | POA: Diagnosis not present

## 2016-05-18 DIAGNOSIS — N186 End stage renal disease: Secondary | ICD-10-CM | POA: Diagnosis not present

## 2016-05-19 DIAGNOSIS — C73 Malignant neoplasm of thyroid gland: Secondary | ICD-10-CM | POA: Diagnosis not present

## 2016-05-19 DIAGNOSIS — Z1329 Encounter for screening for other suspected endocrine disorder: Secondary | ICD-10-CM | POA: Diagnosis not present

## 2016-05-20 DIAGNOSIS — D509 Iron deficiency anemia, unspecified: Secondary | ICD-10-CM | POA: Diagnosis not present

## 2016-05-20 DIAGNOSIS — N186 End stage renal disease: Secondary | ICD-10-CM | POA: Diagnosis not present

## 2016-05-20 DIAGNOSIS — N2581 Secondary hyperparathyroidism of renal origin: Secondary | ICD-10-CM | POA: Diagnosis not present

## 2016-05-20 DIAGNOSIS — Z992 Dependence on renal dialysis: Secondary | ICD-10-CM | POA: Diagnosis not present

## 2016-05-23 DIAGNOSIS — N186 End stage renal disease: Secondary | ICD-10-CM | POA: Diagnosis not present

## 2016-05-23 DIAGNOSIS — N2581 Secondary hyperparathyroidism of renal origin: Secondary | ICD-10-CM | POA: Diagnosis not present

## 2016-05-23 DIAGNOSIS — D509 Iron deficiency anemia, unspecified: Secondary | ICD-10-CM | POA: Diagnosis not present

## 2016-05-23 DIAGNOSIS — Z992 Dependence on renal dialysis: Secondary | ICD-10-CM | POA: Diagnosis not present

## 2016-05-24 DIAGNOSIS — E113593 Type 2 diabetes mellitus with proliferative diabetic retinopathy without macular edema, bilateral: Secondary | ICD-10-CM | POA: Diagnosis not present

## 2016-05-24 DIAGNOSIS — H211X3 Other vascular disorders of iris and ciliary body, bilateral: Secondary | ICD-10-CM | POA: Diagnosis not present

## 2016-05-25 DIAGNOSIS — Z992 Dependence on renal dialysis: Secondary | ICD-10-CM | POA: Diagnosis not present

## 2016-05-25 DIAGNOSIS — D509 Iron deficiency anemia, unspecified: Secondary | ICD-10-CM | POA: Diagnosis not present

## 2016-05-25 DIAGNOSIS — N2581 Secondary hyperparathyroidism of renal origin: Secondary | ICD-10-CM | POA: Diagnosis not present

## 2016-05-25 DIAGNOSIS — N186 End stage renal disease: Secondary | ICD-10-CM | POA: Diagnosis not present

## 2016-05-27 DIAGNOSIS — N2581 Secondary hyperparathyroidism of renal origin: Secondary | ICD-10-CM | POA: Diagnosis not present

## 2016-05-27 DIAGNOSIS — N186 End stage renal disease: Secondary | ICD-10-CM | POA: Diagnosis not present

## 2016-05-27 DIAGNOSIS — Z992 Dependence on renal dialysis: Secondary | ICD-10-CM | POA: Diagnosis not present

## 2016-05-27 DIAGNOSIS — D509 Iron deficiency anemia, unspecified: Secondary | ICD-10-CM | POA: Diagnosis not present

## 2016-05-30 DIAGNOSIS — D509 Iron deficiency anemia, unspecified: Secondary | ICD-10-CM | POA: Diagnosis not present

## 2016-05-30 DIAGNOSIS — N2581 Secondary hyperparathyroidism of renal origin: Secondary | ICD-10-CM | POA: Diagnosis not present

## 2016-05-30 DIAGNOSIS — Z992 Dependence on renal dialysis: Secondary | ICD-10-CM | POA: Diagnosis not present

## 2016-05-30 DIAGNOSIS — N186 End stage renal disease: Secondary | ICD-10-CM | POA: Diagnosis not present

## 2016-05-31 DIAGNOSIS — R251 Tremor, unspecified: Secondary | ICD-10-CM | POA: Diagnosis not present

## 2016-05-31 DIAGNOSIS — H539 Unspecified visual disturbance: Secondary | ICD-10-CM | POA: Diagnosis not present

## 2016-05-31 DIAGNOSIS — Z6841 Body Mass Index (BMI) 40.0 and over, adult: Secondary | ICD-10-CM | POA: Diagnosis not present

## 2016-06-01 DIAGNOSIS — D509 Iron deficiency anemia, unspecified: Secondary | ICD-10-CM | POA: Diagnosis not present

## 2016-06-01 DIAGNOSIS — N2581 Secondary hyperparathyroidism of renal origin: Secondary | ICD-10-CM | POA: Diagnosis not present

## 2016-06-01 DIAGNOSIS — Z992 Dependence on renal dialysis: Secondary | ICD-10-CM | POA: Diagnosis not present

## 2016-06-01 DIAGNOSIS — N186 End stage renal disease: Secondary | ICD-10-CM | POA: Diagnosis not present

## 2016-06-02 DIAGNOSIS — M79652 Pain in left thigh: Secondary | ICD-10-CM | POA: Diagnosis not present

## 2016-06-02 DIAGNOSIS — M79651 Pain in right thigh: Secondary | ICD-10-CM | POA: Diagnosis not present

## 2016-06-02 DIAGNOSIS — Z79891 Long term (current) use of opiate analgesic: Secondary | ICD-10-CM | POA: Diagnosis not present

## 2016-06-02 DIAGNOSIS — M25562 Pain in left knee: Secondary | ICD-10-CM | POA: Diagnosis not present

## 2016-06-02 DIAGNOSIS — M25561 Pain in right knee: Secondary | ICD-10-CM | POA: Diagnosis not present

## 2016-06-03 DIAGNOSIS — N186 End stage renal disease: Secondary | ICD-10-CM | POA: Diagnosis not present

## 2016-06-03 DIAGNOSIS — Z992 Dependence on renal dialysis: Secondary | ICD-10-CM | POA: Diagnosis not present

## 2016-06-03 DIAGNOSIS — D509 Iron deficiency anemia, unspecified: Secondary | ICD-10-CM | POA: Diagnosis not present

## 2016-06-03 DIAGNOSIS — N2581 Secondary hyperparathyroidism of renal origin: Secondary | ICD-10-CM | POA: Diagnosis not present

## 2016-06-06 DIAGNOSIS — Z992 Dependence on renal dialysis: Secondary | ICD-10-CM | POA: Diagnosis not present

## 2016-06-06 DIAGNOSIS — N2581 Secondary hyperparathyroidism of renal origin: Secondary | ICD-10-CM | POA: Diagnosis not present

## 2016-06-06 DIAGNOSIS — D509 Iron deficiency anemia, unspecified: Secondary | ICD-10-CM | POA: Diagnosis not present

## 2016-06-06 DIAGNOSIS — N186 End stage renal disease: Secondary | ICD-10-CM | POA: Diagnosis not present

## 2016-06-08 DIAGNOSIS — D509 Iron deficiency anemia, unspecified: Secondary | ICD-10-CM | POA: Diagnosis not present

## 2016-06-08 DIAGNOSIS — N186 End stage renal disease: Secondary | ICD-10-CM | POA: Diagnosis not present

## 2016-06-08 DIAGNOSIS — Z992 Dependence on renal dialysis: Secondary | ICD-10-CM | POA: Diagnosis not present

## 2016-06-08 DIAGNOSIS — N2581 Secondary hyperparathyroidism of renal origin: Secondary | ICD-10-CM | POA: Diagnosis not present

## 2016-06-10 DIAGNOSIS — N186 End stage renal disease: Secondary | ICD-10-CM | POA: Diagnosis not present

## 2016-06-10 DIAGNOSIS — Z992 Dependence on renal dialysis: Secondary | ICD-10-CM | POA: Diagnosis not present

## 2016-06-10 DIAGNOSIS — D509 Iron deficiency anemia, unspecified: Secondary | ICD-10-CM | POA: Diagnosis not present

## 2016-06-10 DIAGNOSIS — N2581 Secondary hyperparathyroidism of renal origin: Secondary | ICD-10-CM | POA: Diagnosis not present

## 2016-06-13 DIAGNOSIS — N2581 Secondary hyperparathyroidism of renal origin: Secondary | ICD-10-CM | POA: Diagnosis not present

## 2016-06-13 DIAGNOSIS — Z992 Dependence on renal dialysis: Secondary | ICD-10-CM | POA: Diagnosis not present

## 2016-06-13 DIAGNOSIS — N186 End stage renal disease: Secondary | ICD-10-CM | POA: Diagnosis not present

## 2016-06-13 DIAGNOSIS — D509 Iron deficiency anemia, unspecified: Secondary | ICD-10-CM | POA: Diagnosis not present

## 2016-06-15 DIAGNOSIS — N186 End stage renal disease: Secondary | ICD-10-CM | POA: Diagnosis not present

## 2016-06-15 DIAGNOSIS — D509 Iron deficiency anemia, unspecified: Secondary | ICD-10-CM | POA: Diagnosis not present

## 2016-06-15 DIAGNOSIS — N2581 Secondary hyperparathyroidism of renal origin: Secondary | ICD-10-CM | POA: Diagnosis not present

## 2016-06-15 DIAGNOSIS — Z992 Dependence on renal dialysis: Secondary | ICD-10-CM | POA: Diagnosis not present

## 2016-06-16 DIAGNOSIS — Z992 Dependence on renal dialysis: Secondary | ICD-10-CM | POA: Diagnosis not present

## 2016-06-16 DIAGNOSIS — N186 End stage renal disease: Secondary | ICD-10-CM | POA: Diagnosis not present

## 2016-06-17 DIAGNOSIS — N2581 Secondary hyperparathyroidism of renal origin: Secondary | ICD-10-CM | POA: Diagnosis not present

## 2016-06-17 DIAGNOSIS — Z992 Dependence on renal dialysis: Secondary | ICD-10-CM | POA: Diagnosis not present

## 2016-06-17 DIAGNOSIS — Z23 Encounter for immunization: Secondary | ICD-10-CM | POA: Diagnosis not present

## 2016-06-17 DIAGNOSIS — N186 End stage renal disease: Secondary | ICD-10-CM | POA: Diagnosis not present

## 2016-06-17 DIAGNOSIS — D509 Iron deficiency anemia, unspecified: Secondary | ICD-10-CM | POA: Diagnosis not present

## 2016-06-20 DIAGNOSIS — N186 End stage renal disease: Secondary | ICD-10-CM | POA: Diagnosis not present

## 2016-06-20 DIAGNOSIS — D509 Iron deficiency anemia, unspecified: Secondary | ICD-10-CM | POA: Diagnosis not present

## 2016-06-20 DIAGNOSIS — Z23 Encounter for immunization: Secondary | ICD-10-CM | POA: Diagnosis not present

## 2016-06-20 DIAGNOSIS — Z992 Dependence on renal dialysis: Secondary | ICD-10-CM | POA: Diagnosis not present

## 2016-06-20 DIAGNOSIS — N2581 Secondary hyperparathyroidism of renal origin: Secondary | ICD-10-CM | POA: Diagnosis not present

## 2016-06-22 DIAGNOSIS — Z23 Encounter for immunization: Secondary | ICD-10-CM | POA: Diagnosis not present

## 2016-06-22 DIAGNOSIS — N186 End stage renal disease: Secondary | ICD-10-CM | POA: Diagnosis not present

## 2016-06-22 DIAGNOSIS — N2581 Secondary hyperparathyroidism of renal origin: Secondary | ICD-10-CM | POA: Diagnosis not present

## 2016-06-22 DIAGNOSIS — Z992 Dependence on renal dialysis: Secondary | ICD-10-CM | POA: Diagnosis not present

## 2016-06-22 DIAGNOSIS — D509 Iron deficiency anemia, unspecified: Secondary | ICD-10-CM | POA: Diagnosis not present

## 2016-06-23 DIAGNOSIS — I10 Essential (primary) hypertension: Secondary | ICD-10-CM | POA: Diagnosis not present

## 2016-06-23 DIAGNOSIS — E1142 Type 2 diabetes mellitus with diabetic polyneuropathy: Secondary | ICD-10-CM | POA: Diagnosis not present

## 2016-06-23 DIAGNOSIS — N185 Chronic kidney disease, stage 5: Secondary | ICD-10-CM | POA: Diagnosis not present

## 2016-06-23 DIAGNOSIS — I25118 Atherosclerotic heart disease of native coronary artery with other forms of angina pectoris: Secondary | ICD-10-CM | POA: Diagnosis not present

## 2016-06-24 DIAGNOSIS — N186 End stage renal disease: Secondary | ICD-10-CM | POA: Diagnosis not present

## 2016-06-24 DIAGNOSIS — Z23 Encounter for immunization: Secondary | ICD-10-CM | POA: Diagnosis not present

## 2016-06-24 DIAGNOSIS — Z992 Dependence on renal dialysis: Secondary | ICD-10-CM | POA: Diagnosis not present

## 2016-06-24 DIAGNOSIS — N2581 Secondary hyperparathyroidism of renal origin: Secondary | ICD-10-CM | POA: Diagnosis not present

## 2016-06-24 DIAGNOSIS — D509 Iron deficiency anemia, unspecified: Secondary | ICD-10-CM | POA: Diagnosis not present

## 2016-06-26 DIAGNOSIS — Z992 Dependence on renal dialysis: Secondary | ICD-10-CM | POA: Diagnosis not present

## 2016-06-26 DIAGNOSIS — N186 End stage renal disease: Secondary | ICD-10-CM | POA: Diagnosis not present

## 2016-06-26 DIAGNOSIS — N2581 Secondary hyperparathyroidism of renal origin: Secondary | ICD-10-CM | POA: Diagnosis not present

## 2016-06-26 DIAGNOSIS — Z23 Encounter for immunization: Secondary | ICD-10-CM | POA: Diagnosis not present

## 2016-06-26 DIAGNOSIS — D509 Iron deficiency anemia, unspecified: Secondary | ICD-10-CM | POA: Diagnosis not present

## 2016-06-29 DIAGNOSIS — N2581 Secondary hyperparathyroidism of renal origin: Secondary | ICD-10-CM | POA: Diagnosis not present

## 2016-06-29 DIAGNOSIS — Z23 Encounter for immunization: Secondary | ICD-10-CM | POA: Diagnosis not present

## 2016-06-29 DIAGNOSIS — N186 End stage renal disease: Secondary | ICD-10-CM | POA: Diagnosis not present

## 2016-06-29 DIAGNOSIS — Z992 Dependence on renal dialysis: Secondary | ICD-10-CM | POA: Diagnosis not present

## 2016-06-29 DIAGNOSIS — D509 Iron deficiency anemia, unspecified: Secondary | ICD-10-CM | POA: Diagnosis not present

## 2016-07-01 DIAGNOSIS — D509 Iron deficiency anemia, unspecified: Secondary | ICD-10-CM | POA: Diagnosis not present

## 2016-07-01 DIAGNOSIS — N186 End stage renal disease: Secondary | ICD-10-CM | POA: Diagnosis not present

## 2016-07-01 DIAGNOSIS — Z992 Dependence on renal dialysis: Secondary | ICD-10-CM | POA: Diagnosis not present

## 2016-07-01 DIAGNOSIS — Z23 Encounter for immunization: Secondary | ICD-10-CM | POA: Diagnosis not present

## 2016-07-01 DIAGNOSIS — N2581 Secondary hyperparathyroidism of renal origin: Secondary | ICD-10-CM | POA: Diagnosis not present

## 2016-07-04 DIAGNOSIS — N186 End stage renal disease: Secondary | ICD-10-CM | POA: Diagnosis not present

## 2016-07-04 DIAGNOSIS — D509 Iron deficiency anemia, unspecified: Secondary | ICD-10-CM | POA: Diagnosis not present

## 2016-07-04 DIAGNOSIS — Z23 Encounter for immunization: Secondary | ICD-10-CM | POA: Diagnosis not present

## 2016-07-04 DIAGNOSIS — N2581 Secondary hyperparathyroidism of renal origin: Secondary | ICD-10-CM | POA: Diagnosis not present

## 2016-07-04 DIAGNOSIS — Z992 Dependence on renal dialysis: Secondary | ICD-10-CM | POA: Diagnosis not present

## 2016-07-05 DIAGNOSIS — M79652 Pain in left thigh: Secondary | ICD-10-CM | POA: Diagnosis not present

## 2016-07-05 DIAGNOSIS — M25551 Pain in right hip: Secondary | ICD-10-CM | POA: Diagnosis not present

## 2016-07-05 DIAGNOSIS — G894 Chronic pain syndrome: Secondary | ICD-10-CM | POA: Diagnosis not present

## 2016-07-05 DIAGNOSIS — M25552 Pain in left hip: Secondary | ICD-10-CM | POA: Diagnosis not present

## 2016-07-05 DIAGNOSIS — Z79891 Long term (current) use of opiate analgesic: Secondary | ICD-10-CM | POA: Diagnosis not present

## 2016-07-05 DIAGNOSIS — M545 Low back pain: Secondary | ICD-10-CM | POA: Diagnosis not present

## 2016-07-05 DIAGNOSIS — M79651 Pain in right thigh: Secondary | ICD-10-CM | POA: Diagnosis not present

## 2016-07-05 DIAGNOSIS — Z89511 Acquired absence of right leg below knee: Secondary | ICD-10-CM | POA: Diagnosis not present

## 2016-07-05 DIAGNOSIS — Z89512 Acquired absence of left leg below knee: Secondary | ICD-10-CM | POA: Diagnosis not present

## 2016-07-06 DIAGNOSIS — N186 End stage renal disease: Secondary | ICD-10-CM | POA: Diagnosis not present

## 2016-07-06 DIAGNOSIS — Z23 Encounter for immunization: Secondary | ICD-10-CM | POA: Diagnosis not present

## 2016-07-06 DIAGNOSIS — N2581 Secondary hyperparathyroidism of renal origin: Secondary | ICD-10-CM | POA: Diagnosis not present

## 2016-07-06 DIAGNOSIS — Z992 Dependence on renal dialysis: Secondary | ICD-10-CM | POA: Diagnosis not present

## 2016-07-06 DIAGNOSIS — D509 Iron deficiency anemia, unspecified: Secondary | ICD-10-CM | POA: Diagnosis not present

## 2016-07-08 DIAGNOSIS — N186 End stage renal disease: Secondary | ICD-10-CM | POA: Diagnosis not present

## 2016-07-08 DIAGNOSIS — Z23 Encounter for immunization: Secondary | ICD-10-CM | POA: Diagnosis not present

## 2016-07-08 DIAGNOSIS — Z992 Dependence on renal dialysis: Secondary | ICD-10-CM | POA: Diagnosis not present

## 2016-07-08 DIAGNOSIS — D509 Iron deficiency anemia, unspecified: Secondary | ICD-10-CM | POA: Diagnosis not present

## 2016-07-08 DIAGNOSIS — N2581 Secondary hyperparathyroidism of renal origin: Secondary | ICD-10-CM | POA: Diagnosis not present

## 2016-07-11 DIAGNOSIS — Z23 Encounter for immunization: Secondary | ICD-10-CM | POA: Diagnosis not present

## 2016-07-11 DIAGNOSIS — Z794 Long term (current) use of insulin: Secondary | ICD-10-CM | POA: Diagnosis not present

## 2016-07-11 DIAGNOSIS — Z992 Dependence on renal dialysis: Secondary | ICD-10-CM | POA: Diagnosis not present

## 2016-07-11 DIAGNOSIS — N186 End stage renal disease: Secondary | ICD-10-CM | POA: Diagnosis not present

## 2016-07-11 DIAGNOSIS — E109 Type 1 diabetes mellitus without complications: Secondary | ICD-10-CM | POA: Diagnosis not present

## 2016-07-11 DIAGNOSIS — D509 Iron deficiency anemia, unspecified: Secondary | ICD-10-CM | POA: Diagnosis not present

## 2016-07-11 DIAGNOSIS — N2581 Secondary hyperparathyroidism of renal origin: Secondary | ICD-10-CM | POA: Diagnosis not present

## 2016-07-13 DIAGNOSIS — Z23 Encounter for immunization: Secondary | ICD-10-CM | POA: Diagnosis not present

## 2016-07-13 DIAGNOSIS — N186 End stage renal disease: Secondary | ICD-10-CM | POA: Diagnosis not present

## 2016-07-13 DIAGNOSIS — N2581 Secondary hyperparathyroidism of renal origin: Secondary | ICD-10-CM | POA: Diagnosis not present

## 2016-07-13 DIAGNOSIS — Z992 Dependence on renal dialysis: Secondary | ICD-10-CM | POA: Diagnosis not present

## 2016-07-13 DIAGNOSIS — D509 Iron deficiency anemia, unspecified: Secondary | ICD-10-CM | POA: Diagnosis not present

## 2016-07-15 DIAGNOSIS — N186 End stage renal disease: Secondary | ICD-10-CM | POA: Diagnosis not present

## 2016-07-15 DIAGNOSIS — Z992 Dependence on renal dialysis: Secondary | ICD-10-CM | POA: Diagnosis not present

## 2016-07-15 DIAGNOSIS — D509 Iron deficiency anemia, unspecified: Secondary | ICD-10-CM | POA: Diagnosis not present

## 2016-07-15 DIAGNOSIS — N2581 Secondary hyperparathyroidism of renal origin: Secondary | ICD-10-CM | POA: Diagnosis not present

## 2016-07-15 DIAGNOSIS — Z23 Encounter for immunization: Secondary | ICD-10-CM | POA: Diagnosis not present

## 2016-07-16 DIAGNOSIS — Z992 Dependence on renal dialysis: Secondary | ICD-10-CM | POA: Diagnosis not present

## 2016-07-16 DIAGNOSIS — N186 End stage renal disease: Secondary | ICD-10-CM | POA: Diagnosis not present

## 2016-07-18 DIAGNOSIS — Z992 Dependence on renal dialysis: Secondary | ICD-10-CM | POA: Diagnosis not present

## 2016-07-18 DIAGNOSIS — D631 Anemia in chronic kidney disease: Secondary | ICD-10-CM | POA: Diagnosis not present

## 2016-07-18 DIAGNOSIS — N186 End stage renal disease: Secondary | ICD-10-CM | POA: Diagnosis not present

## 2016-07-18 DIAGNOSIS — D509 Iron deficiency anemia, unspecified: Secondary | ICD-10-CM | POA: Diagnosis not present

## 2016-07-19 DIAGNOSIS — N186 End stage renal disease: Secondary | ICD-10-CM | POA: Diagnosis not present

## 2016-07-19 DIAGNOSIS — L501 Idiopathic urticaria: Secondary | ICD-10-CM | POA: Diagnosis not present

## 2016-07-19 DIAGNOSIS — I10 Essential (primary) hypertension: Secondary | ICD-10-CM | POA: Diagnosis not present

## 2016-07-19 DIAGNOSIS — E1142 Type 2 diabetes mellitus with diabetic polyneuropathy: Secondary | ICD-10-CM | POA: Diagnosis not present

## 2016-07-19 DIAGNOSIS — I7389 Other specified peripheral vascular diseases: Secondary | ICD-10-CM | POA: Diagnosis not present

## 2016-07-19 DIAGNOSIS — I25118 Atherosclerotic heart disease of native coronary artery with other forms of angina pectoris: Secondary | ICD-10-CM | POA: Diagnosis not present

## 2016-07-19 DIAGNOSIS — J440 Chronic obstructive pulmonary disease with acute lower respiratory infection: Secondary | ICD-10-CM | POA: Diagnosis not present

## 2016-07-20 DIAGNOSIS — N186 End stage renal disease: Secondary | ICD-10-CM | POA: Diagnosis not present

## 2016-07-20 DIAGNOSIS — D509 Iron deficiency anemia, unspecified: Secondary | ICD-10-CM | POA: Diagnosis not present

## 2016-07-20 DIAGNOSIS — Z992 Dependence on renal dialysis: Secondary | ICD-10-CM | POA: Diagnosis not present

## 2016-07-20 DIAGNOSIS — D631 Anemia in chronic kidney disease: Secondary | ICD-10-CM | POA: Diagnosis not present

## 2016-07-20 DIAGNOSIS — Z79891 Long term (current) use of opiate analgesic: Secondary | ICD-10-CM | POA: Diagnosis not present

## 2016-07-22 DIAGNOSIS — D509 Iron deficiency anemia, unspecified: Secondary | ICD-10-CM | POA: Diagnosis not present

## 2016-07-22 DIAGNOSIS — D631 Anemia in chronic kidney disease: Secondary | ICD-10-CM | POA: Diagnosis not present

## 2016-07-22 DIAGNOSIS — N186 End stage renal disease: Secondary | ICD-10-CM | POA: Diagnosis not present

## 2016-07-22 DIAGNOSIS — Z992 Dependence on renal dialysis: Secondary | ICD-10-CM | POA: Diagnosis not present

## 2016-07-25 DIAGNOSIS — N186 End stage renal disease: Secondary | ICD-10-CM | POA: Diagnosis not present

## 2016-07-25 DIAGNOSIS — D509 Iron deficiency anemia, unspecified: Secondary | ICD-10-CM | POA: Diagnosis not present

## 2016-07-25 DIAGNOSIS — Z992 Dependence on renal dialysis: Secondary | ICD-10-CM | POA: Diagnosis not present

## 2016-07-25 DIAGNOSIS — D631 Anemia in chronic kidney disease: Secondary | ICD-10-CM | POA: Diagnosis not present

## 2016-07-27 DIAGNOSIS — Z992 Dependence on renal dialysis: Secondary | ICD-10-CM | POA: Diagnosis not present

## 2016-07-27 DIAGNOSIS — D631 Anemia in chronic kidney disease: Secondary | ICD-10-CM | POA: Diagnosis not present

## 2016-07-27 DIAGNOSIS — D509 Iron deficiency anemia, unspecified: Secondary | ICD-10-CM | POA: Diagnosis not present

## 2016-07-27 DIAGNOSIS — N186 End stage renal disease: Secondary | ICD-10-CM | POA: Diagnosis not present

## 2016-07-28 DIAGNOSIS — I25118 Atherosclerotic heart disease of native coronary artery with other forms of angina pectoris: Secondary | ICD-10-CM | POA: Diagnosis not present

## 2016-07-28 DIAGNOSIS — E1142 Type 2 diabetes mellitus with diabetic polyneuropathy: Secondary | ICD-10-CM | POA: Diagnosis not present

## 2016-07-28 DIAGNOSIS — I10 Essential (primary) hypertension: Secondary | ICD-10-CM | POA: Diagnosis not present

## 2016-07-29 DIAGNOSIS — D631 Anemia in chronic kidney disease: Secondary | ICD-10-CM | POA: Diagnosis not present

## 2016-07-29 DIAGNOSIS — N186 End stage renal disease: Secondary | ICD-10-CM | POA: Diagnosis not present

## 2016-07-29 DIAGNOSIS — Z992 Dependence on renal dialysis: Secondary | ICD-10-CM | POA: Diagnosis not present

## 2016-07-29 DIAGNOSIS — D509 Iron deficiency anemia, unspecified: Secondary | ICD-10-CM | POA: Diagnosis not present

## 2016-08-01 DIAGNOSIS — E113593 Type 2 diabetes mellitus with proliferative diabetic retinopathy without macular edema, bilateral: Secondary | ICD-10-CM | POA: Diagnosis not present

## 2016-08-01 DIAGNOSIS — Z992 Dependence on renal dialysis: Secondary | ICD-10-CM | POA: Diagnosis not present

## 2016-08-01 DIAGNOSIS — D509 Iron deficiency anemia, unspecified: Secondary | ICD-10-CM | POA: Diagnosis not present

## 2016-08-01 DIAGNOSIS — N186 End stage renal disease: Secondary | ICD-10-CM | POA: Diagnosis not present

## 2016-08-01 DIAGNOSIS — H4311 Vitreous hemorrhage, right eye: Secondary | ICD-10-CM | POA: Diagnosis not present

## 2016-08-01 DIAGNOSIS — D631 Anemia in chronic kidney disease: Secondary | ICD-10-CM | POA: Diagnosis not present

## 2016-08-03 DIAGNOSIS — N186 End stage renal disease: Secondary | ICD-10-CM | POA: Diagnosis not present

## 2016-08-03 DIAGNOSIS — Z992 Dependence on renal dialysis: Secondary | ICD-10-CM | POA: Diagnosis not present

## 2016-08-03 DIAGNOSIS — D631 Anemia in chronic kidney disease: Secondary | ICD-10-CM | POA: Diagnosis not present

## 2016-08-03 DIAGNOSIS — D509 Iron deficiency anemia, unspecified: Secondary | ICD-10-CM | POA: Diagnosis not present

## 2016-08-04 DIAGNOSIS — M79651 Pain in right thigh: Secondary | ICD-10-CM | POA: Diagnosis not present

## 2016-08-04 DIAGNOSIS — Z79891 Long term (current) use of opiate analgesic: Secondary | ICD-10-CM | POA: Diagnosis not present

## 2016-08-04 DIAGNOSIS — M25552 Pain in left hip: Secondary | ICD-10-CM | POA: Diagnosis not present

## 2016-08-04 DIAGNOSIS — G894 Chronic pain syndrome: Secondary | ICD-10-CM | POA: Diagnosis not present

## 2016-08-04 DIAGNOSIS — M25561 Pain in right knee: Secondary | ICD-10-CM | POA: Diagnosis not present

## 2016-08-04 DIAGNOSIS — M25551 Pain in right hip: Secondary | ICD-10-CM | POA: Diagnosis not present

## 2016-08-04 DIAGNOSIS — M79652 Pain in left thigh: Secondary | ICD-10-CM | POA: Diagnosis not present

## 2016-08-04 DIAGNOSIS — Z89511 Acquired absence of right leg below knee: Secondary | ICD-10-CM | POA: Diagnosis not present

## 2016-08-04 DIAGNOSIS — Z89512 Acquired absence of left leg below knee: Secondary | ICD-10-CM | POA: Diagnosis not present

## 2016-08-05 DIAGNOSIS — N186 End stage renal disease: Secondary | ICD-10-CM | POA: Diagnosis not present

## 2016-08-05 DIAGNOSIS — D509 Iron deficiency anemia, unspecified: Secondary | ICD-10-CM | POA: Diagnosis not present

## 2016-08-05 DIAGNOSIS — Z992 Dependence on renal dialysis: Secondary | ICD-10-CM | POA: Diagnosis not present

## 2016-08-05 DIAGNOSIS — D631 Anemia in chronic kidney disease: Secondary | ICD-10-CM | POA: Diagnosis not present

## 2016-08-09 DIAGNOSIS — N186 End stage renal disease: Secondary | ICD-10-CM | POA: Diagnosis not present

## 2016-08-09 DIAGNOSIS — D631 Anemia in chronic kidney disease: Secondary | ICD-10-CM | POA: Diagnosis not present

## 2016-08-09 DIAGNOSIS — D509 Iron deficiency anemia, unspecified: Secondary | ICD-10-CM | POA: Diagnosis not present

## 2016-08-09 DIAGNOSIS — Z992 Dependence on renal dialysis: Secondary | ICD-10-CM | POA: Diagnosis not present

## 2016-08-10 DIAGNOSIS — D509 Iron deficiency anemia, unspecified: Secondary | ICD-10-CM | POA: Diagnosis not present

## 2016-08-10 DIAGNOSIS — Z79899 Other long term (current) drug therapy: Secondary | ICD-10-CM | POA: Diagnosis not present

## 2016-08-10 DIAGNOSIS — N186 End stage renal disease: Secondary | ICD-10-CM | POA: Diagnosis not present

## 2016-08-10 DIAGNOSIS — D631 Anemia in chronic kidney disease: Secondary | ICD-10-CM | POA: Diagnosis not present

## 2016-08-10 DIAGNOSIS — Z992 Dependence on renal dialysis: Secondary | ICD-10-CM | POA: Diagnosis not present

## 2016-08-12 DIAGNOSIS — Z992 Dependence on renal dialysis: Secondary | ICD-10-CM | POA: Diagnosis not present

## 2016-08-12 DIAGNOSIS — N186 End stage renal disease: Secondary | ICD-10-CM | POA: Diagnosis not present

## 2016-08-12 DIAGNOSIS — D509 Iron deficiency anemia, unspecified: Secondary | ICD-10-CM | POA: Diagnosis not present

## 2016-08-12 DIAGNOSIS — D631 Anemia in chronic kidney disease: Secondary | ICD-10-CM | POA: Diagnosis not present

## 2016-08-15 DIAGNOSIS — Z992 Dependence on renal dialysis: Secondary | ICD-10-CM | POA: Diagnosis not present

## 2016-08-15 DIAGNOSIS — D631 Anemia in chronic kidney disease: Secondary | ICD-10-CM | POA: Diagnosis not present

## 2016-08-15 DIAGNOSIS — N186 End stage renal disease: Secondary | ICD-10-CM | POA: Diagnosis not present

## 2016-08-15 DIAGNOSIS — D509 Iron deficiency anemia, unspecified: Secondary | ICD-10-CM | POA: Diagnosis not present

## 2016-08-16 DIAGNOSIS — N186 End stage renal disease: Secondary | ICD-10-CM | POA: Diagnosis not present

## 2016-08-16 DIAGNOSIS — Z992 Dependence on renal dialysis: Secondary | ICD-10-CM | POA: Diagnosis not present

## 2016-08-17 DIAGNOSIS — Z992 Dependence on renal dialysis: Secondary | ICD-10-CM | POA: Diagnosis not present

## 2016-08-17 DIAGNOSIS — N2581 Secondary hyperparathyroidism of renal origin: Secondary | ICD-10-CM | POA: Diagnosis not present

## 2016-08-17 DIAGNOSIS — N186 End stage renal disease: Secondary | ICD-10-CM | POA: Diagnosis not present

## 2016-08-17 DIAGNOSIS — D509 Iron deficiency anemia, unspecified: Secondary | ICD-10-CM | POA: Diagnosis not present

## 2016-08-18 DIAGNOSIS — I25118 Atherosclerotic heart disease of native coronary artery with other forms of angina pectoris: Secondary | ICD-10-CM | POA: Diagnosis not present

## 2016-08-18 DIAGNOSIS — E1142 Type 2 diabetes mellitus with diabetic polyneuropathy: Secondary | ICD-10-CM | POA: Diagnosis not present

## 2016-08-18 DIAGNOSIS — I10 Essential (primary) hypertension: Secondary | ICD-10-CM | POA: Diagnosis not present

## 2016-08-19 DIAGNOSIS — N2581 Secondary hyperparathyroidism of renal origin: Secondary | ICD-10-CM | POA: Diagnosis not present

## 2016-08-19 DIAGNOSIS — Z992 Dependence on renal dialysis: Secondary | ICD-10-CM | POA: Diagnosis not present

## 2016-08-19 DIAGNOSIS — N186 End stage renal disease: Secondary | ICD-10-CM | POA: Diagnosis not present

## 2016-08-19 DIAGNOSIS — D509 Iron deficiency anemia, unspecified: Secondary | ICD-10-CM | POA: Diagnosis not present

## 2016-08-22 DIAGNOSIS — D509 Iron deficiency anemia, unspecified: Secondary | ICD-10-CM | POA: Diagnosis not present

## 2016-08-22 DIAGNOSIS — N186 End stage renal disease: Secondary | ICD-10-CM | POA: Diagnosis not present

## 2016-08-22 DIAGNOSIS — Z992 Dependence on renal dialysis: Secondary | ICD-10-CM | POA: Diagnosis not present

## 2016-08-22 DIAGNOSIS — N2581 Secondary hyperparathyroidism of renal origin: Secondary | ICD-10-CM | POA: Diagnosis not present

## 2016-08-23 ENCOUNTER — Encounter: Payer: Self-pay | Admitting: Surgery

## 2016-08-24 DIAGNOSIS — D509 Iron deficiency anemia, unspecified: Secondary | ICD-10-CM | POA: Diagnosis not present

## 2016-08-24 DIAGNOSIS — N2581 Secondary hyperparathyroidism of renal origin: Secondary | ICD-10-CM | POA: Diagnosis not present

## 2016-08-24 DIAGNOSIS — N186 End stage renal disease: Secondary | ICD-10-CM | POA: Diagnosis not present

## 2016-08-24 DIAGNOSIS — Z992 Dependence on renal dialysis: Secondary | ICD-10-CM | POA: Diagnosis not present

## 2016-08-25 DIAGNOSIS — H4311 Vitreous hemorrhage, right eye: Secondary | ICD-10-CM | POA: Diagnosis not present

## 2016-08-25 DIAGNOSIS — E113593 Type 2 diabetes mellitus with proliferative diabetic retinopathy without macular edema, bilateral: Secondary | ICD-10-CM | POA: Diagnosis not present

## 2016-08-26 DIAGNOSIS — D509 Iron deficiency anemia, unspecified: Secondary | ICD-10-CM | POA: Diagnosis not present

## 2016-08-26 DIAGNOSIS — N2581 Secondary hyperparathyroidism of renal origin: Secondary | ICD-10-CM | POA: Diagnosis not present

## 2016-08-26 DIAGNOSIS — N186 End stage renal disease: Secondary | ICD-10-CM | POA: Diagnosis not present

## 2016-08-26 DIAGNOSIS — Z992 Dependence on renal dialysis: Secondary | ICD-10-CM | POA: Diagnosis not present

## 2016-08-29 DIAGNOSIS — D509 Iron deficiency anemia, unspecified: Secondary | ICD-10-CM | POA: Diagnosis not present

## 2016-08-29 DIAGNOSIS — N186 End stage renal disease: Secondary | ICD-10-CM | POA: Diagnosis not present

## 2016-08-29 DIAGNOSIS — Z992 Dependence on renal dialysis: Secondary | ICD-10-CM | POA: Diagnosis not present

## 2016-08-29 DIAGNOSIS — N2581 Secondary hyperparathyroidism of renal origin: Secondary | ICD-10-CM | POA: Diagnosis not present

## 2016-08-31 DIAGNOSIS — N2581 Secondary hyperparathyroidism of renal origin: Secondary | ICD-10-CM | POA: Diagnosis not present

## 2016-08-31 DIAGNOSIS — D509 Iron deficiency anemia, unspecified: Secondary | ICD-10-CM | POA: Diagnosis not present

## 2016-08-31 DIAGNOSIS — Z992 Dependence on renal dialysis: Secondary | ICD-10-CM | POA: Diagnosis not present

## 2016-08-31 DIAGNOSIS — N186 End stage renal disease: Secondary | ICD-10-CM | POA: Diagnosis not present

## 2016-09-02 DIAGNOSIS — Z992 Dependence on renal dialysis: Secondary | ICD-10-CM | POA: Diagnosis not present

## 2016-09-02 DIAGNOSIS — N2581 Secondary hyperparathyroidism of renal origin: Secondary | ICD-10-CM | POA: Diagnosis not present

## 2016-09-02 DIAGNOSIS — D509 Iron deficiency anemia, unspecified: Secondary | ICD-10-CM | POA: Diagnosis not present

## 2016-09-02 DIAGNOSIS — N186 End stage renal disease: Secondary | ICD-10-CM | POA: Diagnosis not present

## 2016-09-05 DIAGNOSIS — Z992 Dependence on renal dialysis: Secondary | ICD-10-CM | POA: Diagnosis not present

## 2016-09-05 DIAGNOSIS — N186 End stage renal disease: Secondary | ICD-10-CM | POA: Diagnosis not present

## 2016-09-05 DIAGNOSIS — D509 Iron deficiency anemia, unspecified: Secondary | ICD-10-CM | POA: Diagnosis not present

## 2016-09-05 DIAGNOSIS — N2581 Secondary hyperparathyroidism of renal origin: Secondary | ICD-10-CM | POA: Diagnosis not present

## 2016-09-07 DIAGNOSIS — M25552 Pain in left hip: Secondary | ICD-10-CM | POA: Diagnosis not present

## 2016-09-07 DIAGNOSIS — Z89512 Acquired absence of left leg below knee: Secondary | ICD-10-CM | POA: Diagnosis not present

## 2016-09-07 DIAGNOSIS — Z89511 Acquired absence of right leg below knee: Secondary | ICD-10-CM | POA: Diagnosis not present

## 2016-09-07 DIAGNOSIS — M25551 Pain in right hip: Secondary | ICD-10-CM | POA: Diagnosis not present

## 2016-09-07 DIAGNOSIS — G890 Central pain syndrome: Secondary | ICD-10-CM | POA: Diagnosis not present

## 2016-09-07 DIAGNOSIS — G894 Chronic pain syndrome: Secondary | ICD-10-CM | POA: Diagnosis not present

## 2016-09-07 DIAGNOSIS — Z79891 Long term (current) use of opiate analgesic: Secondary | ICD-10-CM | POA: Diagnosis not present

## 2016-09-09 DIAGNOSIS — Z992 Dependence on renal dialysis: Secondary | ICD-10-CM | POA: Diagnosis not present

## 2016-09-09 DIAGNOSIS — N186 End stage renal disease: Secondary | ICD-10-CM | POA: Diagnosis not present

## 2016-09-09 DIAGNOSIS — D509 Iron deficiency anemia, unspecified: Secondary | ICD-10-CM | POA: Diagnosis not present

## 2016-09-09 DIAGNOSIS — N2581 Secondary hyperparathyroidism of renal origin: Secondary | ICD-10-CM | POA: Diagnosis not present

## 2016-09-12 DIAGNOSIS — N186 End stage renal disease: Secondary | ICD-10-CM | POA: Diagnosis not present

## 2016-09-12 DIAGNOSIS — D509 Iron deficiency anemia, unspecified: Secondary | ICD-10-CM | POA: Diagnosis not present

## 2016-09-12 DIAGNOSIS — N2581 Secondary hyperparathyroidism of renal origin: Secondary | ICD-10-CM | POA: Diagnosis not present

## 2016-09-12 DIAGNOSIS — Z992 Dependence on renal dialysis: Secondary | ICD-10-CM | POA: Diagnosis not present

## 2016-09-14 DIAGNOSIS — N2581 Secondary hyperparathyroidism of renal origin: Secondary | ICD-10-CM | POA: Diagnosis not present

## 2016-09-14 DIAGNOSIS — N186 End stage renal disease: Secondary | ICD-10-CM | POA: Diagnosis not present

## 2016-09-14 DIAGNOSIS — Z992 Dependence on renal dialysis: Secondary | ICD-10-CM | POA: Diagnosis not present

## 2016-09-14 DIAGNOSIS — D509 Iron deficiency anemia, unspecified: Secondary | ICD-10-CM | POA: Diagnosis not present

## 2016-09-16 DIAGNOSIS — D509 Iron deficiency anemia, unspecified: Secondary | ICD-10-CM | POA: Diagnosis not present

## 2016-09-16 DIAGNOSIS — Z992 Dependence on renal dialysis: Secondary | ICD-10-CM | POA: Diagnosis not present

## 2016-09-16 DIAGNOSIS — N186 End stage renal disease: Secondary | ICD-10-CM | POA: Diagnosis not present

## 2016-09-16 DIAGNOSIS — N2581 Secondary hyperparathyroidism of renal origin: Secondary | ICD-10-CM | POA: Diagnosis not present

## 2016-09-19 DIAGNOSIS — D509 Iron deficiency anemia, unspecified: Secondary | ICD-10-CM | POA: Diagnosis not present

## 2016-09-19 DIAGNOSIS — N186 End stage renal disease: Secondary | ICD-10-CM | POA: Diagnosis not present

## 2016-09-19 DIAGNOSIS — Z992 Dependence on renal dialysis: Secondary | ICD-10-CM | POA: Diagnosis not present

## 2016-09-19 DIAGNOSIS — N2581 Secondary hyperparathyroidism of renal origin: Secondary | ICD-10-CM | POA: Diagnosis not present

## 2016-09-21 DIAGNOSIS — D509 Iron deficiency anemia, unspecified: Secondary | ICD-10-CM | POA: Diagnosis not present

## 2016-09-21 DIAGNOSIS — Z992 Dependence on renal dialysis: Secondary | ICD-10-CM | POA: Diagnosis not present

## 2016-09-21 DIAGNOSIS — N186 End stage renal disease: Secondary | ICD-10-CM | POA: Diagnosis not present

## 2016-09-21 DIAGNOSIS — N2581 Secondary hyperparathyroidism of renal origin: Secondary | ICD-10-CM | POA: Diagnosis not present

## 2016-09-22 ENCOUNTER — Other Ambulatory Visit: Payer: Self-pay | Admitting: Surgery

## 2016-09-22 ENCOUNTER — Ambulatory Visit: Payer: Self-pay | Admitting: Surgery

## 2016-09-22 DIAGNOSIS — N186 End stage renal disease: Secondary | ICD-10-CM | POA: Diagnosis not present

## 2016-09-22 DIAGNOSIS — Z992 Dependence on renal dialysis: Secondary | ICD-10-CM | POA: Diagnosis not present

## 2016-09-22 DIAGNOSIS — E1142 Type 2 diabetes mellitus with diabetic polyneuropathy: Secondary | ICD-10-CM | POA: Diagnosis not present

## 2016-09-22 DIAGNOSIS — I25118 Atherosclerotic heart disease of native coronary artery with other forms of angina pectoris: Secondary | ICD-10-CM | POA: Diagnosis not present

## 2016-09-22 DIAGNOSIS — I10 Essential (primary) hypertension: Secondary | ICD-10-CM | POA: Diagnosis not present

## 2016-09-22 NOTE — H&P (Signed)
Angela Soto 09/22/2016 8:57 AM Location: [facility] Patient #: [id number] DOB: 1971/01/29 Married / Language: Angela Soto / Race: White Female  History of Present Illness (Shallyn Constancio A. Kae Heller MD; 09/22/2016 9:25 AM) Patient words: This is a very nice 45 year old woman with multiple comorbidities who presents for evaluation for peritoneal dialysis catheter placement. She is currently on hemodialysis via a left upper arm AV fistula Monday Wednesday and Friday. The fistula is functioning well, and dialysis is without complication except for the fact that she has to lie still for 4-1/2 hours and this is uncomfortable for her in the dialysis chair. She is a severe vasculopath and has a history of bilateral below-knee amputations which were preceded by attempts at bypass. Previously she had what sounds like an aortic occlusion which was addressed open via a left flank incision. She also has a history of MI/cad S/P angioplasty and stents, multiple sclerosis, diabetes, and NASH. Last saw her cardiologist in March when she had a stress test performed. She remains a current every day smoker.  She seems a bit equivocal about getting the PD catheter, as she really does enjoy going to dialysis and refers to the dialysis team as her lifeline. She recently was approved to undergo dialysis in her wheelchair, which is especially made for her. Apparently her husband even offered to purchase a comfortable recliner for her dialysis sessions but the center was not able to allow that based on some kind of state regulations.  The patient is a 45 year old female.   Other Problems Benjiman Core, CMA; 09/22/2016 8:57 AM) Chronic Renal Failure Syndrome Diabetes Mellitus Gastroesophageal Reflux Disease High blood pressure Myocardial infarction Thyroid Cancer Vascular Disease  Past Surgical History Benjiman Core, CMA; 09/22/2016 8:57 AM) Bypass Surgery for Poor Blood Flow to Legs Carotid Artery  Surgery Right. Dialysis Shunt / Fistula Foot Surgery Bilateral. Oral Surgery Thyroid Surgery  Diagnostic Studies History Benjiman Core, CMA; 09/22/2016 8:57 AM) Colonoscopy never Mammogram never Pap Smear 1-5 years ago  Allergies Benjiman Core, CMA; 09/22/2016 8:59 AM) METFORMIN Diarrhea, Nausea, Vomiting. IBUPROFEN  Medication History (Armen Eulas Post, CMA; 09/22/2016 9:01 AM) AmLODIPine Besylate (10MG  Tablet, Oral) Active. Clopidogrel Bisulfate (75MG  Tablet, Oral) Active. Renvela (800MG  Tablet, Oral) Active. HydrALAZINE HCl (25MG  Tablet, Oral) Active. Furosemide (80MG  Tablet, Oral) Active. Metoprolol Succinate ER (200MG  Tablet ER 24HR, Oral) Active. Oxycodone-Acetaminophen (7.5-325MG  Tablet, Oral) Active. Promethazine HCl (25MG  Tablet, Oral) Active. Lantus (100UNIT/ML Solution, Subcutaneous) Active. HumaLOG (100UNIT/ML Solution, Subcutaneous) Active. Medications Reconciled  Social History Benjiman Core, CMA; 09/22/2016 8:57 AM) Caffeine use Coffee. Illicit drug use Remotely quit drug use. No alcohol use Tobacco use Current every day smoker.  Family History Benjiman Core, [place]; 09/22/2016 8:57 AM) Cancer Mother. Diabetes Mellitus Brother, Father, Mother. Heart Disease Father. Heart disease in female family member before age 69 Hypertension Father, Mother.  Pregnancy / Birth History Benjiman Core, [place]; 09/22/2016 8:57 AM) Age at menarche 61 years. Age of menopause <45 Gravida 0 Irregular periods Para 0     Review of Systems (Armen Glenn CMA; 09/22/2016 8:57 AM) General Present- Fatigue. Not Present- Appetite Loss, Chills, Fever, Night Sweats, Weight Gain and Weight Loss. Skin Present- Dryness. Not Present- Change in Wart/Mole, Hives, Jaundice, New Lesions, Non-Healing Wounds, Rash and Ulcer. HEENT Present- Sore Throat and Wears glasses/contact lenses. Not Present- Earache, Hearing Loss, Hoarseness, Nose Bleed, Oral Ulcers, Ringing in the Ears,  Seasonal Allergies, Sinus Pain, Visual Disturbances and Yellow Eyes. Respiratory Not Present- Bloody sputum, Chronic Cough, Difficulty Breathing, Snoring and Wheezing.  Breast Not Present- Breast Mass, Breast Pain, Nipple Discharge and Skin Changes. Cardiovascular Not Present- Chest Pain, Difficulty Breathing Lying Down, Leg Cramps, Palpitations, Rapid Heart Rate, Shortness of Breath and Swelling of Extremities. Gastrointestinal Present- Vomiting. Not Present- Abdominal Pain, Bloating, Bloody Stool, Change in Bowel Habits, Chronic diarrhea, Constipation, Difficulty Swallowing, Excessive gas, Gets full quickly at meals, Hemorrhoids, Indigestion, Nausea and Rectal Pain. Female Genitourinary Not Present- Frequency, Nocturia, Painful Urination, Pelvic Pain and Urgency. Musculoskeletal Present- Back Pain, Joint Pain and Muscle Weakness. Not Present- Joint Stiffness, Muscle Pain and Swelling of Extremities. Neurological Present- Tremor. Not Present- Decreased Memory, Fainting, Headaches, Numbness, Seizures, Tingling, Trouble walking and Weakness. Psychiatric Not Present- Anxiety, Bipolar, Change in Sleep Pattern, Depression, Fearful and Frequent crying. Hematology Present- Blood Thinners and HIV. Not Present- Easy Bruising, Excessive bleeding, Gland problems and Persistent Infections.  Vitals (Armen Glenn CMA; 09/22/2016 8:58 AM) 09/22/2016 8:58 AM Weight: 188 lb Height: 51in Body Surface Area: 1.62 m Body Mass Index: 50.82 kg/m  Temp.: 97.5F  Pulse: 62 (Regular)  P.OX: 98% (Room air) BP: 138/72 (Sitting, Left Arm, Standard)      Physical Exam (Dian Laprade A. Kae Heller MD; 09/22/2016 9:28 AM)  General Note: She is alert and oriented, pleasant and appropriate. She appears older than stated age  Integumentary Note: No rashes or lesions  Head and Neck Note: Normocephalic, atraumatic. Neck without mass or lymphadenopathy. There is a transverse collar incision from prior  thyroidectomy  Eye Note: Next a mesh intact, pupils equal round and reactive. Anicteric  Chest and Lung Exam Note: Unlabored respirations, clear bilaterally  Cardiovascular Note: Regular rate and rhythm. bilateral below-knee amputations. Tips of multiple digits missing on left hand.  Abdomen Note: Soft, nontender, nondistended. There is some fullness in the subcutaneous fat above the umbilicus but no palpable hernia defect. She has a well-healed left flank incision suggestive of a retroperitoneal approach for her prior aortic surgery.  Neurologic Note: Grossly intact. Strength symmetrical bilateral upper extremities  Neuropsychiatric Note: Normal mood and affect, appropriate insight    Assessment & Plan (Kazuto Sevey A. Kae Heller MD; 09/22/2016 9:39 AM)  ESRD (END STAGE RENAL DISEASE) ON DIALYSIS (N18.6) Story: Desires PD catheter placement for comfort and convenience. We discussed that she is exceptionally high risk for surgery. It seems like her only abdominal surgery was likely retroperitoneal approach, so a PD catheter woulod be *technically* feasible. However she has a few issues- She will need cardiac clearance, and she will need to hold her antiplatelet agents for 7 days prior to surgery. Given her charted history of NASH in the ultrasound that she had in 2014 which described hepatic cirrhosis with ascites and splenomegaly consistent with portal venous hypertension, I would like to repeat that ultrasound now along with LFTs, CBC (platelets) and INR to assess her liver function. If she does have portal hypertension she will require a longer delay time before using the catheter, her surgery will be higher risk for bleeding, and she will be at increased risk at least transiently increased protein losses and bacterial peritonitis. Will plan for lap assisted PD cath placement once above workup/clearance complete.

## 2016-09-23 DIAGNOSIS — N186 End stage renal disease: Secondary | ICD-10-CM | POA: Diagnosis not present

## 2016-09-23 DIAGNOSIS — N2581 Secondary hyperparathyroidism of renal origin: Secondary | ICD-10-CM | POA: Diagnosis not present

## 2016-09-23 DIAGNOSIS — D509 Iron deficiency anemia, unspecified: Secondary | ICD-10-CM | POA: Diagnosis not present

## 2016-09-23 DIAGNOSIS — Z992 Dependence on renal dialysis: Secondary | ICD-10-CM | POA: Diagnosis not present

## 2016-09-26 DIAGNOSIS — D509 Iron deficiency anemia, unspecified: Secondary | ICD-10-CM | POA: Diagnosis not present

## 2016-09-26 DIAGNOSIS — Z992 Dependence on renal dialysis: Secondary | ICD-10-CM | POA: Diagnosis not present

## 2016-09-26 DIAGNOSIS — N186 End stage renal disease: Secondary | ICD-10-CM | POA: Diagnosis not present

## 2016-09-26 DIAGNOSIS — N2581 Secondary hyperparathyroidism of renal origin: Secondary | ICD-10-CM | POA: Diagnosis not present

## 2016-09-28 DIAGNOSIS — Z992 Dependence on renal dialysis: Secondary | ICD-10-CM | POA: Diagnosis not present

## 2016-09-28 DIAGNOSIS — N186 End stage renal disease: Secondary | ICD-10-CM | POA: Diagnosis not present

## 2016-09-28 DIAGNOSIS — N2581 Secondary hyperparathyroidism of renal origin: Secondary | ICD-10-CM | POA: Diagnosis not present

## 2016-09-28 DIAGNOSIS — D509 Iron deficiency anemia, unspecified: Secondary | ICD-10-CM | POA: Diagnosis not present

## 2016-09-30 DIAGNOSIS — D509 Iron deficiency anemia, unspecified: Secondary | ICD-10-CM | POA: Diagnosis not present

## 2016-09-30 DIAGNOSIS — N186 End stage renal disease: Secondary | ICD-10-CM | POA: Diagnosis not present

## 2016-09-30 DIAGNOSIS — N2581 Secondary hyperparathyroidism of renal origin: Secondary | ICD-10-CM | POA: Diagnosis not present

## 2016-09-30 DIAGNOSIS — Z992 Dependence on renal dialysis: Secondary | ICD-10-CM | POA: Diagnosis not present

## 2016-10-03 DIAGNOSIS — Z992 Dependence on renal dialysis: Secondary | ICD-10-CM | POA: Diagnosis not present

## 2016-10-03 DIAGNOSIS — D509 Iron deficiency anemia, unspecified: Secondary | ICD-10-CM | POA: Diagnosis not present

## 2016-10-03 DIAGNOSIS — N2581 Secondary hyperparathyroidism of renal origin: Secondary | ICD-10-CM | POA: Diagnosis not present

## 2016-10-03 DIAGNOSIS — N186 End stage renal disease: Secondary | ICD-10-CM | POA: Diagnosis not present

## 2016-10-04 DIAGNOSIS — M25551 Pain in right hip: Secondary | ICD-10-CM | POA: Diagnosis not present

## 2016-10-04 DIAGNOSIS — Z79891 Long term (current) use of opiate analgesic: Secondary | ICD-10-CM | POA: Diagnosis not present

## 2016-10-04 DIAGNOSIS — G890 Central pain syndrome: Secondary | ICD-10-CM | POA: Diagnosis not present

## 2016-10-04 DIAGNOSIS — G603 Idiopathic progressive neuropathy: Secondary | ICD-10-CM | POA: Diagnosis not present

## 2016-10-04 DIAGNOSIS — M25552 Pain in left hip: Secondary | ICD-10-CM | POA: Diagnosis not present

## 2016-10-04 DIAGNOSIS — G894 Chronic pain syndrome: Secondary | ICD-10-CM | POA: Diagnosis not present

## 2016-10-04 DIAGNOSIS — Z89511 Acquired absence of right leg below knee: Secondary | ICD-10-CM | POA: Diagnosis not present

## 2016-10-04 DIAGNOSIS — Z89512 Acquired absence of left leg below knee: Secondary | ICD-10-CM | POA: Diagnosis not present

## 2016-10-04 DIAGNOSIS — G541 Lumbosacral plexus disorders: Secondary | ICD-10-CM | POA: Diagnosis not present

## 2016-10-05 DIAGNOSIS — F17200 Nicotine dependence, unspecified, uncomplicated: Secondary | ICD-10-CM | POA: Diagnosis not present

## 2016-10-05 DIAGNOSIS — E113591 Type 2 diabetes mellitus with proliferative diabetic retinopathy without macular edema, right eye: Secondary | ICD-10-CM | POA: Diagnosis not present

## 2016-10-05 DIAGNOSIS — I252 Old myocardial infarction: Secondary | ICD-10-CM | POA: Diagnosis not present

## 2016-10-05 DIAGNOSIS — N186 End stage renal disease: Secondary | ICD-10-CM | POA: Diagnosis not present

## 2016-10-05 DIAGNOSIS — Z886 Allergy status to analgesic agent status: Secondary | ICD-10-CM | POA: Diagnosis not present

## 2016-10-05 DIAGNOSIS — Z79899 Other long term (current) drug therapy: Secondary | ICD-10-CM | POA: Diagnosis not present

## 2016-10-05 DIAGNOSIS — Z888 Allergy status to other drugs, medicaments and biological substances status: Secondary | ICD-10-CM | POA: Diagnosis not present

## 2016-10-05 DIAGNOSIS — I120 Hypertensive chronic kidney disease with stage 5 chronic kidney disease or end stage renal disease: Secondary | ICD-10-CM | POA: Diagnosis not present

## 2016-10-05 DIAGNOSIS — I2510 Atherosclerotic heart disease of native coronary artery without angina pectoris: Secondary | ICD-10-CM | POA: Diagnosis not present

## 2016-10-05 DIAGNOSIS — H4311 Vitreous hemorrhage, right eye: Secondary | ICD-10-CM | POA: Diagnosis not present

## 2016-10-05 DIAGNOSIS — Z7902 Long term (current) use of antithrombotics/antiplatelets: Secondary | ICD-10-CM | POA: Diagnosis not present

## 2016-10-05 DIAGNOSIS — E785 Hyperlipidemia, unspecified: Secondary | ICD-10-CM | POA: Diagnosis not present

## 2016-10-05 DIAGNOSIS — Z794 Long term (current) use of insulin: Secondary | ICD-10-CM | POA: Diagnosis not present

## 2016-10-06 DIAGNOSIS — E109 Type 1 diabetes mellitus without complications: Secondary | ICD-10-CM | POA: Diagnosis not present

## 2016-10-06 DIAGNOSIS — D509 Iron deficiency anemia, unspecified: Secondary | ICD-10-CM | POA: Diagnosis not present

## 2016-10-06 DIAGNOSIS — N2581 Secondary hyperparathyroidism of renal origin: Secondary | ICD-10-CM | POA: Diagnosis not present

## 2016-10-06 DIAGNOSIS — Z992 Dependence on renal dialysis: Secondary | ICD-10-CM | POA: Diagnosis not present

## 2016-10-06 DIAGNOSIS — N186 End stage renal disease: Secondary | ICD-10-CM | POA: Diagnosis not present

## 2016-10-06 DIAGNOSIS — Z794 Long term (current) use of insulin: Secondary | ICD-10-CM | POA: Diagnosis not present

## 2016-10-08 DIAGNOSIS — N2581 Secondary hyperparathyroidism of renal origin: Secondary | ICD-10-CM | POA: Diagnosis not present

## 2016-10-08 DIAGNOSIS — N186 End stage renal disease: Secondary | ICD-10-CM | POA: Diagnosis not present

## 2016-10-08 DIAGNOSIS — Z992 Dependence on renal dialysis: Secondary | ICD-10-CM | POA: Diagnosis not present

## 2016-10-08 DIAGNOSIS — D509 Iron deficiency anemia, unspecified: Secondary | ICD-10-CM | POA: Diagnosis not present

## 2016-10-11 ENCOUNTER — Other Ambulatory Visit: Payer: Medicare Other

## 2016-10-11 DIAGNOSIS — N186 End stage renal disease: Secondary | ICD-10-CM | POA: Diagnosis not present

## 2016-10-11 DIAGNOSIS — N2581 Secondary hyperparathyroidism of renal origin: Secondary | ICD-10-CM | POA: Diagnosis not present

## 2016-10-11 DIAGNOSIS — D509 Iron deficiency anemia, unspecified: Secondary | ICD-10-CM | POA: Diagnosis not present

## 2016-10-11 DIAGNOSIS — Z992 Dependence on renal dialysis: Secondary | ICD-10-CM | POA: Diagnosis not present

## 2016-10-12 DIAGNOSIS — D509 Iron deficiency anemia, unspecified: Secondary | ICD-10-CM | POA: Diagnosis not present

## 2016-10-12 DIAGNOSIS — N186 End stage renal disease: Secondary | ICD-10-CM | POA: Diagnosis not present

## 2016-10-12 DIAGNOSIS — Z992 Dependence on renal dialysis: Secondary | ICD-10-CM | POA: Diagnosis not present

## 2016-10-12 DIAGNOSIS — N2581 Secondary hyperparathyroidism of renal origin: Secondary | ICD-10-CM | POA: Diagnosis not present

## 2016-10-13 DIAGNOSIS — E113593 Type 2 diabetes mellitus with proliferative diabetic retinopathy without macular edema, bilateral: Secondary | ICD-10-CM | POA: Diagnosis not present

## 2016-10-14 DIAGNOSIS — N2581 Secondary hyperparathyroidism of renal origin: Secondary | ICD-10-CM | POA: Diagnosis not present

## 2016-10-14 DIAGNOSIS — N186 End stage renal disease: Secondary | ICD-10-CM | POA: Diagnosis not present

## 2016-10-14 DIAGNOSIS — Z992 Dependence on renal dialysis: Secondary | ICD-10-CM | POA: Diagnosis not present

## 2016-10-14 DIAGNOSIS — D509 Iron deficiency anemia, unspecified: Secondary | ICD-10-CM | POA: Diagnosis not present

## 2016-10-16 DIAGNOSIS — N2581 Secondary hyperparathyroidism of renal origin: Secondary | ICD-10-CM | POA: Diagnosis not present

## 2016-10-16 DIAGNOSIS — D509 Iron deficiency anemia, unspecified: Secondary | ICD-10-CM | POA: Diagnosis not present

## 2016-10-16 DIAGNOSIS — N186 End stage renal disease: Secondary | ICD-10-CM | POA: Diagnosis not present

## 2016-10-16 DIAGNOSIS — Z992 Dependence on renal dialysis: Secondary | ICD-10-CM | POA: Diagnosis not present

## 2016-10-19 DIAGNOSIS — N2581 Secondary hyperparathyroidism of renal origin: Secondary | ICD-10-CM | POA: Diagnosis not present

## 2016-10-19 DIAGNOSIS — Z992 Dependence on renal dialysis: Secondary | ICD-10-CM | POA: Diagnosis not present

## 2016-10-19 DIAGNOSIS — D509 Iron deficiency anemia, unspecified: Secondary | ICD-10-CM | POA: Diagnosis not present

## 2016-10-19 DIAGNOSIS — N186 End stage renal disease: Secondary | ICD-10-CM | POA: Diagnosis not present

## 2016-10-21 DIAGNOSIS — Z992 Dependence on renal dialysis: Secondary | ICD-10-CM | POA: Diagnosis not present

## 2016-10-21 DIAGNOSIS — D509 Iron deficiency anemia, unspecified: Secondary | ICD-10-CM | POA: Diagnosis not present

## 2016-10-21 DIAGNOSIS — N2581 Secondary hyperparathyroidism of renal origin: Secondary | ICD-10-CM | POA: Diagnosis not present

## 2016-10-21 DIAGNOSIS — N186 End stage renal disease: Secondary | ICD-10-CM | POA: Diagnosis not present

## 2016-10-24 DIAGNOSIS — N186 End stage renal disease: Secondary | ICD-10-CM | POA: Diagnosis not present

## 2016-10-24 DIAGNOSIS — N2581 Secondary hyperparathyroidism of renal origin: Secondary | ICD-10-CM | POA: Diagnosis not present

## 2016-10-24 DIAGNOSIS — Z992 Dependence on renal dialysis: Secondary | ICD-10-CM | POA: Diagnosis not present

## 2016-10-24 DIAGNOSIS — D509 Iron deficiency anemia, unspecified: Secondary | ICD-10-CM | POA: Diagnosis not present

## 2016-10-26 DIAGNOSIS — Z992 Dependence on renal dialysis: Secondary | ICD-10-CM | POA: Diagnosis not present

## 2016-10-26 DIAGNOSIS — N186 End stage renal disease: Secondary | ICD-10-CM | POA: Diagnosis not present

## 2016-10-26 DIAGNOSIS — N2581 Secondary hyperparathyroidism of renal origin: Secondary | ICD-10-CM | POA: Diagnosis not present

## 2016-10-26 DIAGNOSIS — D509 Iron deficiency anemia, unspecified: Secondary | ICD-10-CM | POA: Diagnosis not present

## 2016-10-27 DIAGNOSIS — E113593 Type 2 diabetes mellitus with proliferative diabetic retinopathy without macular edema, bilateral: Secondary | ICD-10-CM | POA: Diagnosis not present

## 2016-10-28 DIAGNOSIS — Z992 Dependence on renal dialysis: Secondary | ICD-10-CM | POA: Diagnosis not present

## 2016-10-28 DIAGNOSIS — N2581 Secondary hyperparathyroidism of renal origin: Secondary | ICD-10-CM | POA: Diagnosis not present

## 2016-10-28 DIAGNOSIS — N186 End stage renal disease: Secondary | ICD-10-CM | POA: Diagnosis not present

## 2016-10-28 DIAGNOSIS — D509 Iron deficiency anemia, unspecified: Secondary | ICD-10-CM | POA: Diagnosis not present

## 2016-10-31 DIAGNOSIS — D509 Iron deficiency anemia, unspecified: Secondary | ICD-10-CM | POA: Diagnosis not present

## 2016-10-31 DIAGNOSIS — N186 End stage renal disease: Secondary | ICD-10-CM | POA: Diagnosis not present

## 2016-10-31 DIAGNOSIS — Z992 Dependence on renal dialysis: Secondary | ICD-10-CM | POA: Diagnosis not present

## 2016-10-31 DIAGNOSIS — N2581 Secondary hyperparathyroidism of renal origin: Secondary | ICD-10-CM | POA: Diagnosis not present

## 2016-11-01 DIAGNOSIS — Z89511 Acquired absence of right leg below knee: Secondary | ICD-10-CM | POA: Diagnosis not present

## 2016-11-01 DIAGNOSIS — M25562 Pain in left knee: Secondary | ICD-10-CM | POA: Diagnosis not present

## 2016-11-01 DIAGNOSIS — M25551 Pain in right hip: Secondary | ICD-10-CM | POA: Diagnosis not present

## 2016-11-01 DIAGNOSIS — M25561 Pain in right knee: Secondary | ICD-10-CM | POA: Diagnosis not present

## 2016-11-01 DIAGNOSIS — M25552 Pain in left hip: Secondary | ICD-10-CM | POA: Diagnosis not present

## 2016-11-01 DIAGNOSIS — Z79891 Long term (current) use of opiate analgesic: Secondary | ICD-10-CM | POA: Diagnosis not present

## 2016-11-01 DIAGNOSIS — M79652 Pain in left thigh: Secondary | ICD-10-CM | POA: Diagnosis not present

## 2016-11-01 DIAGNOSIS — G894 Chronic pain syndrome: Secondary | ICD-10-CM | POA: Diagnosis not present

## 2016-11-01 DIAGNOSIS — Z89512 Acquired absence of left leg below knee: Secondary | ICD-10-CM | POA: Diagnosis not present

## 2016-11-03 DIAGNOSIS — N186 End stage renal disease: Secondary | ICD-10-CM | POA: Diagnosis not present

## 2016-11-03 DIAGNOSIS — N2581 Secondary hyperparathyroidism of renal origin: Secondary | ICD-10-CM | POA: Diagnosis not present

## 2016-11-03 DIAGNOSIS — D509 Iron deficiency anemia, unspecified: Secondary | ICD-10-CM | POA: Diagnosis not present

## 2016-11-03 DIAGNOSIS — Z992 Dependence on renal dialysis: Secondary | ICD-10-CM | POA: Diagnosis not present

## 2016-11-05 DIAGNOSIS — N2581 Secondary hyperparathyroidism of renal origin: Secondary | ICD-10-CM | POA: Diagnosis not present

## 2016-11-05 DIAGNOSIS — D509 Iron deficiency anemia, unspecified: Secondary | ICD-10-CM | POA: Diagnosis not present

## 2016-11-05 DIAGNOSIS — Z992 Dependence on renal dialysis: Secondary | ICD-10-CM | POA: Diagnosis not present

## 2016-11-05 DIAGNOSIS — N186 End stage renal disease: Secondary | ICD-10-CM | POA: Diagnosis not present

## 2016-11-07 ENCOUNTER — Encounter: Payer: Self-pay | Admitting: *Deleted

## 2016-11-07 DIAGNOSIS — Z992 Dependence on renal dialysis: Secondary | ICD-10-CM | POA: Diagnosis not present

## 2016-11-07 DIAGNOSIS — D509 Iron deficiency anemia, unspecified: Secondary | ICD-10-CM | POA: Diagnosis not present

## 2016-11-07 DIAGNOSIS — N2581 Secondary hyperparathyroidism of renal origin: Secondary | ICD-10-CM | POA: Diagnosis not present

## 2016-11-07 DIAGNOSIS — N186 End stage renal disease: Secondary | ICD-10-CM | POA: Diagnosis not present

## 2016-11-08 ENCOUNTER — Ambulatory Visit (INDEPENDENT_AMBULATORY_CARE_PROVIDER_SITE_OTHER): Payer: Medicare Other | Admitting: Cardiovascular Disease

## 2016-11-08 ENCOUNTER — Encounter: Payer: Self-pay | Admitting: *Deleted

## 2016-11-08 ENCOUNTER — Encounter: Payer: Self-pay | Admitting: Cardiovascular Disease

## 2016-11-08 VITALS — BP 110/68 | HR 58 | Ht <= 58 in | Wt 195.0 lb

## 2016-11-08 DIAGNOSIS — I25118 Atherosclerotic heart disease of native coronary artery with other forms of angina pectoris: Secondary | ICD-10-CM | POA: Diagnosis not present

## 2016-11-08 DIAGNOSIS — I252 Old myocardial infarction: Secondary | ICD-10-CM | POA: Diagnosis not present

## 2016-11-08 DIAGNOSIS — Z992 Dependence on renal dialysis: Secondary | ICD-10-CM

## 2016-11-08 DIAGNOSIS — I10 Essential (primary) hypertension: Secondary | ICD-10-CM | POA: Diagnosis not present

## 2016-11-08 DIAGNOSIS — Z955 Presence of coronary angioplasty implant and graft: Secondary | ICD-10-CM

## 2016-11-08 DIAGNOSIS — N186 End stage renal disease: Secondary | ICD-10-CM

## 2016-11-08 DIAGNOSIS — Z01818 Encounter for other preprocedural examination: Secondary | ICD-10-CM

## 2016-11-08 DIAGNOSIS — E782 Mixed hyperlipidemia: Secondary | ICD-10-CM

## 2016-11-08 MED ORDER — ASPIRIN EC 81 MG PO TBEC: 81.0000 mg | DELAYED_RELEASE_TABLET | Freq: Every day | ORAL | Status: AC

## 2016-11-08 NOTE — Progress Notes (Signed)
CARDIOLOGY CONSULT NOTE  Patient ID: Angela Soto MRN: [id number] DOB/AGE: 07/11/1971 46 y.o.  Admit date: (Not on file) Primary Physician: Neale Burly, MD Referring Physician:   Reason for Consultation: Preoperative risk stratification  HPI: The patient is a 46 year old woman with a history of end-stage renal disease on hemodialysis, thyroid cancer, insulin-dependent diabetes mellitus, tobacco abuse, peripheral vascular disease, coronary artery disease with stents. She has bilateral BKA and right eye vision loss due to diabetic vitreous hemorrhage.  She underwent a nondiagnostic dobutamine stress echocardiogram on 01/05/16. There was a submaximal heart rate. Resting left ventricular systolic function was normal, LVEF 55-60%. There was inferior wall akinesis.  She is being evaluated for peritoneal dialysis catheter placement for comfort and convenience. A retroperitoneal approach is being considered. She is referred today for preoperative risk stratification.  She tells me she had an MI in February 1998 in [place], [place]. She said she has 4 stents and all of them were placed at [facility]. Previous cardiologist at that time was Dr. Algernon Huxley.  Symptoms prior to MI were chest pain, left arm pain, shortness of breath, and dizziness.  She has been having chest pain lasting seconds but has also been experiencing left shoulder and left shoulder blade aches and pains. She denies shortness of breath.  She used to be on Lipitor but this was stopped by her PCP for uncertain reasons and she is now on fish oil.  She underwent left BKA in 2008 and right BKA in 2009, both of which were in [place].  She said she will miss going to hemodialysis because this is the only activity she does.   Allergies  Allergen Reactions  . Metformin And Related Diarrhea and Nausea And Vomiting  . Ibuprofen Other (See Comments)    Cannot take because of low kidney function     Current Outpatient Prescriptions  Medication Sig Dispense Refill  . albuterol (PROAIR HFA) 108 (90 BASE) MCG/ACT inhaler Inhale 2 puffs into the lungs 3 (three) times daily as needed for wheezing or shortness of breath (allergies).    Marland Kitchen amLODipine (NORVASC) 10 MG tablet Take 10 mg by mouth daily.    . clopidogrel (PLAVIX) 75 MG tablet Take 75 mg by mouth at bedtime.     . folic acid-vitamin b complex-vitamin c-selenium-zinc (DIALYVITE) 3 MG TABS tablet Take 1 tablet by mouth daily.    . furosemide (LASIX) 80 MG tablet Take 1 tablet (80 mg total) by mouth 2 (two) times daily. 60 tablet 0  . hydrALAZINE (APRESOLINE) 25 MG tablet Take 75 mg by mouth 2 (two) times daily.     . insulin glargine (LANTUS) 100 UNIT/ML injection Inject 52 Units into the skin at bedtime.     . insulin lispro (HUMALOG) 100 UNIT/ML injection Inject 12 Units into the skin 3 (three) times daily before meals.     . lidocaine-prilocaine (EMLA) cream Apply 1 application topically as needed (for pain/ apply prior to dialysis).    . metoprolol (TOPROL-XL) 200 MG 24 hr tablet Take 200 mg by mouth 2 (two) times daily.    . Omega-3 Fatty Acids (FISH OIL) 1000 MG CAPS Take 1,000 mg by mouth 3 (three) times daily.     . OxyCODONE HCl 7.5 MG TABA Take 7.5 mg by mouth every 4 (four) hours as needed (for pain).    . promethazine (PHENERGAN) 25 MG tablet Take 25 mg by mouth every 6 (six) hours as needed  for nausea or vomiting.    . sevelamer carbonate (RENVELA) 800 MG tablet Take 800-1,600 mg by mouth See admin instructions. Take 1 tablet  (800 mg) with snacks and 2 tablets (1600 mg) with meals    . vancomycin (VANCOCIN) 1 GM/200ML SOLN Inject 200 mLs (1,000 mg total) into the vein every Monday, Wednesday, and Friday with hemodialysis. 4000 mL   . varenicline (CHANTIX PAK) 0.5 MG X 11 & 1 MG X 42 tablet Take by mouth 2 (two) times daily. Take one 0.5 mg tablet by mouth once daily for 3 days, then increase to one 0.5 mg tablet twice daily  for 4 days, then increase to one 1 mg tablet twice daily.     No current facility-administered medications for this visit.     Past Medical History:  Diagnosis Date  . Chronic kidney disease    End stage renal disease, will be starting dialysis on 10/08/13  . Complication of anesthesia   . Coronary artery disease   . Diabetes mellitus without complication (HCC)    Type 1  . Environmental allergies    uses inhalers  . Headache(784.0)   . Hypertension   . Multiple sclerosis ([place])    just diagnosed 2014  . Myocardial infarction 1998  . Nonalcoholic steatohepatitis (NASH)   . Peripheral vascular disease ([place])    aortic artery occlusion  . PONV (postoperative nausea and vomiting)   . Umbilical hernia   . Umbilical hernia     Past Surgical History:  Procedure Laterality Date  . ABDOMINAL SURGERY  2006   aortic artery occluded, had to "clean it out"  . AMPUTATION Left 09/22/2014   Procedure: AMPUTATION LEFT FOURTH FINGER;  Surgeon: Dayna Barker, MD;  Location: [facility];  Service: Plastics;  Laterality: Left;  . AV FISTULA PLACEMENT Left 09/24/2013   Procedure: LEFT UPPER EXTREMITY ARTERIOVENOUS (AV) FISTULA CREATION BRACHIAL/CEPHALIC;  Surgeon: Elam Dutch, MD;  Location: [place];  Service: Vascular;  Laterality: Left;  . BASCILIC VEIN TRANSPOSITION Left 02/18/2014   Procedure: BASILIC VEIN TRANSPOSITION - 2ND STAGE LEFT ARM;  Surgeon: Elam Dutch, MD;  Location: [place];  Service: Vascular;  Laterality: Left;  . BLE amputation BK    . CARDIAC CATHETERIZATION    . cardiac stents    . CORONARY ANGIOPLASTY    . INSERTION OF DIALYSIS CATHETER Right 10/07/2013   Procedure: INSERTION OF DIALYSIS CATHETER;  Surgeon: Conrad Ashville, MD;  Location: [place];  Service: Vascular;  Laterality: Right;  . IRRIGATION AND DEBRIDEMENT ABSCESS    . SHUNTOGRAM Left 04/17/2014   Procedure: FISTULOGRAM;  Surgeon: Conrad [place], MD;  Location: [facility] CATH LAB;  Service: Cardiovascular;  Laterality: Left;     Social History   Social History  . Marital status: Married    Spouse name: N/A  . Number of children: N/A  . Years of education: N/A   Occupational History  . Not on file.   Social History Main Topics  . Smoking status: Current Every Day Smoker    Packs/day: 1.00    Types: Cigarettes  . Smokeless tobacco: Never Used  . Alcohol use No  . Drug use: No  . Sexual activity: Not on file   Other Topics Concern  . Not on file   Social History Narrative  . No narrative on file     No family history of premature CAD in 1st degree relatives.  Prior to Admission medications   Medication Sig Start Date End  Date Taking? Authorizing Provider  albuterol (PROAIR HFA) 108 (90 BASE) MCG/ACT inhaler Inhale 2 puffs into the lungs 3 (three) times daily as needed for wheezing or shortness of breath (allergies).    Historical Provider, MD  amLODipine (NORVASC) 10 MG tablet Take 10 mg by mouth daily.    Historical Provider, MD  clopidogrel (PLAVIX) 75 MG tablet Take 75 mg by mouth at bedtime.     Historical Provider, MD  folic acid-vitamin b complex-vitamin c-selenium-zinc (DIALYVITE) 3 MG TABS tablet Take 1 tablet by mouth daily.    Historical Provider, MD  furosemide (LASIX) 80 MG tablet Take 1 tablet (80 mg total) by mouth 2 (two) times daily. 08/03/13   Geradine Girt, DO  hydrALAZINE (APRESOLINE) 25 MG tablet Take 75 mg by mouth 2 (two) times daily.     Historical Provider, MD  insulin glargine (LANTUS) 100 UNIT/ML injection Inject 52 Units into the skin at bedtime.     Historical Provider, MD  insulin lispro (HUMALOG) 100 UNIT/ML injection Inject 12 Units into the skin 3 (three) times daily before meals.     Historical Provider, MD  lidocaine-prilocaine (EMLA) cream Apply 1 application topically as needed (for pain/ apply prior to dialysis).    Historical Provider, MD  metoprolol (TOPROL-XL) 200 MG 24 hr tablet Take 200 mg by mouth 2 (two) times daily.    Historical Provider, MD  Omega-3  Fatty Acids (FISH OIL) 1000 MG CAPS Take 1,000 mg by mouth 3 (three) times daily.     Historical Provider, MD  OxyCODONE HCl 7.5 MG TABA Take 7.5 mg by mouth every 4 (four) hours as needed (for pain).    Historical Provider, MD  promethazine (PHENERGAN) 25 MG tablet Take 25 mg by mouth every 6 (six) hours as needed for nausea or vomiting.    Historical Provider, MD  sevelamer carbonate (RENVELA) 800 MG tablet Take 800-1,600 mg by mouth See admin instructions. Take 1 tablet  (800 mg) with snacks and 2 tablets (1600 mg) with meals    Historical Provider, MD  vancomycin (VANCOCIN) 1 GM/200ML SOLN Inject 200 mLs (1,000 mg total) into the vein every Monday, Wednesday, and Friday with hemodialysis. 09/23/14   Velvet Bathe, MD     Review of systems complete and found to be negative unless listed above in HPI     Physical exam Blood pressure 110/68, pulse (!) 58, height 4\' 3"  (1.295 m), weight 195 lb (88.5 kg), SpO2 98 %. General: NAD Neck: No JVD, no thyromegaly or thyroid nodule.  Lungs: Clear to auscultation bilaterally with normal respiratory effort. CV: Nondisplaced PMI. Regular rate and rhythm, normal S1/S2, no V2/Z3, 3/6 systolic murmur over RUSB/LUSB.  No peripheral edema. Bilateral BKA. Abdomen: Soft, nontender, no distention.  Skin: Intact without lesions or rashes.  Neurologic: Alert and oriented x 3.  Psych: Normal affect. Extremities: Bilateral BKA. HEENT: Normal.   ECG: Most recent ECG reviewed.  Labs:   Lab Results  Component Value Date   WBC 7.1 09/22/2014   HGB 11.4 (L) 09/22/2014   HCT 33.9 (L) 09/22/2014   MCV 85.0 09/22/2014   PLT 174 09/22/2014   No results for input(s): NA, K, CL, CO2, BUN, CREATININE, CALCIUM, PROT, BILITOT, ALKPHOS, ALT, AST, GLUCOSE in the last 168 hours.  Invalid input(s): LABALBU No results found for: CKTOTAL, CKMB, CKMBINDEX, TROPONINI No results found for: CHOL No results found for: HDL No results found for: LDLCALC No results found for:  TRIG No results found for: CHOLHDL  No results found for: LDLDIRECT       Studies: No results found.  ASSESSMENT AND PLAN:  1. CAD with chest pain: Currently taking Toprol-XL, ASA 325 mg, and Plavix. Presumably not on a statin due to a reported history of nonalcoholic steatohepatitis.  I will reduce aspirin to 81 mg daily. Nondiagnostic dobutamine stress echocardiogram in March 2017 as reviewed above. Will obtain a Lexiscan Myoview stress testAnd will hold Toprol-XL the night before and the morning of the study.  2. Preoperative risk stratification: Given her multiple comorbidities she is likely a high risk surgical candidate. Will obtain a Lexiscan Myoview stress test. While I would hold Plavix before the planned surgery, I would not hold aspirin given her multiple stents.  3. Hypertension: Controlled. No changes. On amlodipine 10 mg and hydralazine 75 mg twice daily.  4. Hyperlipidemia: Will obtain copy of lipids from PCP.   Dispo: fu 1 month.   Signed: Kate Sable, M.D., F.A.C.C.  11/08/2016, 1:29 PM

## 2016-11-08 NOTE — Patient Instructions (Signed)
Medication Instructions:   Decrease Aspirin to 81mg  daily.  Continue all other medications.    Labwork: none  Testing/Procedures:  Your physician has requested that you have a lexiscan myoview. For further information please visit [email or web address]. Please follow instruction sheet, as given.  Office will contact with results via phone or letter.    Follow-Up: 1 month   Any Other Special Instructions Will Be Listed Below (If Applicable).  If you need a refill on your cardiac medications before your next appointment, please call your pharmacy.

## 2016-11-09 DIAGNOSIS — N186 End stage renal disease: Secondary | ICD-10-CM | POA: Diagnosis not present

## 2016-11-09 DIAGNOSIS — D509 Iron deficiency anemia, unspecified: Secondary | ICD-10-CM | POA: Diagnosis not present

## 2016-11-09 DIAGNOSIS — N2581 Secondary hyperparathyroidism of renal origin: Secondary | ICD-10-CM | POA: Diagnosis not present

## 2016-11-09 DIAGNOSIS — Z992 Dependence on renal dialysis: Secondary | ICD-10-CM | POA: Diagnosis not present

## 2016-11-11 DIAGNOSIS — N2581 Secondary hyperparathyroidism of renal origin: Secondary | ICD-10-CM | POA: Diagnosis not present

## 2016-11-11 DIAGNOSIS — D509 Iron deficiency anemia, unspecified: Secondary | ICD-10-CM | POA: Diagnosis not present

## 2016-11-11 DIAGNOSIS — Z992 Dependence on renal dialysis: Secondary | ICD-10-CM | POA: Diagnosis not present

## 2016-11-11 DIAGNOSIS — N186 End stage renal disease: Secondary | ICD-10-CM | POA: Diagnosis not present

## 2016-11-14 DIAGNOSIS — N186 End stage renal disease: Secondary | ICD-10-CM | POA: Diagnosis not present

## 2016-11-14 DIAGNOSIS — Z992 Dependence on renal dialysis: Secondary | ICD-10-CM | POA: Diagnosis not present

## 2016-11-14 DIAGNOSIS — D509 Iron deficiency anemia, unspecified: Secondary | ICD-10-CM | POA: Diagnosis not present

## 2016-11-14 DIAGNOSIS — N2581 Secondary hyperparathyroidism of renal origin: Secondary | ICD-10-CM | POA: Diagnosis not present

## 2016-11-15 ENCOUNTER — Encounter (HOSPITAL_COMMUNITY): Payer: Self-pay

## 2016-11-15 ENCOUNTER — Telehealth: Payer: Self-pay

## 2016-11-15 ENCOUNTER — Inpatient Hospital Stay (HOSPITAL_COMMUNITY): Admission: RE | Admit: 2016-11-15 | Payer: Medicare Other | Source: Ambulatory Visit

## 2016-11-15 ENCOUNTER — Encounter (HOSPITAL_COMMUNITY)
Admission: RE | Admit: 2016-11-15 | Discharge: 2016-11-15 | Disposition: A | Discharge status: Home / Self Care | Payer: Medicare Other | Source: Ambulatory Visit | Attending: Cardiovascular Disease | Admitting: Cardiovascular Disease

## 2016-11-15 DIAGNOSIS — R9439 Abnormal result of other cardiovascular function study: Secondary | ICD-10-CM | POA: Diagnosis not present

## 2016-11-15 DIAGNOSIS — I25118 Atherosclerotic heart disease of native coronary artery with other forms of angina pectoris: Secondary | ICD-10-CM | POA: Diagnosis not present

## 2016-11-15 DIAGNOSIS — R258 Other abnormal involuntary movements: Secondary | ICD-10-CM | POA: Diagnosis not present

## 2016-11-15 DIAGNOSIS — Z992 Dependence on renal dialysis: Secondary | ICD-10-CM | POA: Diagnosis not present

## 2016-11-15 DIAGNOSIS — Z79899 Other long term (current) drug therapy: Secondary | ICD-10-CM | POA: Diagnosis not present

## 2016-11-15 DIAGNOSIS — R410 Disorientation, unspecified: Secondary | ICD-10-CM | POA: Diagnosis not present

## 2016-11-15 DIAGNOSIS — R251 Tremor, unspecified: Secondary | ICD-10-CM | POA: Diagnosis not present

## 2016-11-15 DIAGNOSIS — N186 End stage renal disease: Secondary | ICD-10-CM | POA: Diagnosis not present

## 2016-11-15 DIAGNOSIS — I2510 Atherosclerotic heart disease of native coronary artery without angina pectoris: Secondary | ICD-10-CM | POA: Diagnosis not present

## 2016-11-15 DIAGNOSIS — R4189 Other symptoms and signs involving cognitive functions and awareness: Secondary | ICD-10-CM | POA: Diagnosis not present

## 2016-11-15 DIAGNOSIS — N189 Chronic kidney disease, unspecified: Secondary | ICD-10-CM | POA: Diagnosis not present

## 2016-11-15 DIAGNOSIS — H538 Other visual disturbances: Secondary | ICD-10-CM | POA: Diagnosis not present

## 2016-11-15 DIAGNOSIS — H539 Unspecified visual disturbance: Secondary | ICD-10-CM | POA: Diagnosis not present

## 2016-11-15 DIAGNOSIS — F17210 Nicotine dependence, cigarettes, uncomplicated: Secondary | ICD-10-CM | POA: Diagnosis not present

## 2016-11-15 LAB — NM MYOCAR MULTI W/SPECT W/WALL MOTION / EF
CHL CUP NUCLEAR SDS: 3
CHL CUP RESTING HR STRESS: 56 {beats}/min
CSEPPHR: 74 {beats}/min
LHR: 0.43
LV dias vol: 262 mL (ref 46–106)
LVSYSVOL: 153 mL
NUC STRESS TID: 0.99
SRS: 16
SSS: 19

## 2016-11-15 MED ORDER — TECHNETIUM TC 99M TETROFOSMIN IV KIT
10.0000 | PACK | Freq: Once | INTRAVENOUS | Status: AC | PRN
  Administered 2016-11-15: 10.6 via INTRAVENOUS

## 2016-11-15 MED ORDER — SODIUM CHLORIDE 0.9% FLUSH
INTRAVENOUS | Status: AC
  Administered 2016-11-15: 10 mL via INTRAVENOUS
  Filled 2016-11-15: qty 10

## 2016-11-15 MED ORDER — TECHNETIUM TC 99M TETROFOSMIN IV KIT
30.0000 | PACK | Freq: Once | INTRAVENOUS | Status: AC | PRN
  Administered 2016-11-15: 30 via INTRAVENOUS

## 2016-11-15 MED ORDER — REGADENOSON 0.4 MG/5ML IV SOLN
INTRAVENOUS | Status: AC
  Administered 2016-11-15: 0.4 mg via INTRAVENOUS
  Filled 2016-11-15: qty 5

## 2016-11-15 NOTE — Telephone Encounter (Signed)
-----   Message from Herminio Commons, MD sent at 11/15/2016  2:42 PM EST ----- Large area of prior infarction, no new blockages.

## 2016-11-15 NOTE — Telephone Encounter (Signed)
Number listed for patient is not correct,person on the line states she has had several calls from cardiology for this person, but, it is not her.I will forward results to pcp

## 2016-11-16 DIAGNOSIS — N186 End stage renal disease: Secondary | ICD-10-CM | POA: Diagnosis not present

## 2016-11-16 DIAGNOSIS — D509 Iron deficiency anemia, unspecified: Secondary | ICD-10-CM | POA: Diagnosis not present

## 2016-11-16 DIAGNOSIS — N2581 Secondary hyperparathyroidism of renal origin: Secondary | ICD-10-CM | POA: Diagnosis not present

## 2016-11-16 DIAGNOSIS — Z992 Dependence on renal dialysis: Secondary | ICD-10-CM | POA: Diagnosis not present

## 2016-11-18 ENCOUNTER — Encounter: Payer: Self-pay | Admitting: *Deleted

## 2016-11-18 DIAGNOSIS — N186 End stage renal disease: Secondary | ICD-10-CM | POA: Diagnosis not present

## 2016-11-18 DIAGNOSIS — D509 Iron deficiency anemia, unspecified: Secondary | ICD-10-CM | POA: Diagnosis not present

## 2016-11-18 DIAGNOSIS — Z992 Dependence on renal dialysis: Secondary | ICD-10-CM | POA: Diagnosis not present

## 2016-11-18 DIAGNOSIS — N2581 Secondary hyperparathyroidism of renal origin: Secondary | ICD-10-CM | POA: Diagnosis not present

## 2016-11-21 DIAGNOSIS — D509 Iron deficiency anemia, unspecified: Secondary | ICD-10-CM | POA: Diagnosis not present

## 2016-11-21 DIAGNOSIS — Z992 Dependence on renal dialysis: Secondary | ICD-10-CM | POA: Diagnosis not present

## 2016-11-21 DIAGNOSIS — N186 End stage renal disease: Secondary | ICD-10-CM | POA: Diagnosis not present

## 2016-11-21 DIAGNOSIS — N2581 Secondary hyperparathyroidism of renal origin: Secondary | ICD-10-CM | POA: Diagnosis not present

## 2016-11-22 DIAGNOSIS — I10 Essential (primary) hypertension: Secondary | ICD-10-CM | POA: Diagnosis not present

## 2016-11-22 DIAGNOSIS — I25118 Atherosclerotic heart disease of native coronary artery with other forms of angina pectoris: Secondary | ICD-10-CM | POA: Diagnosis not present

## 2016-11-22 DIAGNOSIS — E1142 Type 2 diabetes mellitus with diabetic polyneuropathy: Secondary | ICD-10-CM | POA: Diagnosis not present

## 2016-11-23 DIAGNOSIS — N2581 Secondary hyperparathyroidism of renal origin: Secondary | ICD-10-CM | POA: Diagnosis not present

## 2016-11-23 DIAGNOSIS — D509 Iron deficiency anemia, unspecified: Secondary | ICD-10-CM | POA: Diagnosis not present

## 2016-11-23 DIAGNOSIS — N186 End stage renal disease: Secondary | ICD-10-CM | POA: Diagnosis not present

## 2016-11-23 DIAGNOSIS — Z992 Dependence on renal dialysis: Secondary | ICD-10-CM | POA: Diagnosis not present

## 2016-11-24 DIAGNOSIS — N186 End stage renal disease: Secondary | ICD-10-CM | POA: Diagnosis not present

## 2016-11-25 DIAGNOSIS — Z992 Dependence on renal dialysis: Secondary | ICD-10-CM | POA: Diagnosis not present

## 2016-11-25 DIAGNOSIS — N2581 Secondary hyperparathyroidism of renal origin: Secondary | ICD-10-CM | POA: Diagnosis not present

## 2016-11-25 DIAGNOSIS — N186 End stage renal disease: Secondary | ICD-10-CM | POA: Diagnosis not present

## 2016-11-25 DIAGNOSIS — D509 Iron deficiency anemia, unspecified: Secondary | ICD-10-CM | POA: Diagnosis not present

## 2016-11-28 DIAGNOSIS — N2581 Secondary hyperparathyroidism of renal origin: Secondary | ICD-10-CM | POA: Diagnosis not present

## 2016-11-28 DIAGNOSIS — D509 Iron deficiency anemia, unspecified: Secondary | ICD-10-CM | POA: Diagnosis not present

## 2016-11-28 DIAGNOSIS — Z992 Dependence on renal dialysis: Secondary | ICD-10-CM | POA: Diagnosis not present

## 2016-11-28 DIAGNOSIS — N186 End stage renal disease: Secondary | ICD-10-CM | POA: Diagnosis not present

## 2016-11-29 DIAGNOSIS — J440 Chronic obstructive pulmonary disease with acute lower respiratory infection: Secondary | ICD-10-CM | POA: Diagnosis not present

## 2016-11-29 DIAGNOSIS — I7389 Other specified peripheral vascular diseases: Secondary | ICD-10-CM | POA: Diagnosis not present

## 2016-11-29 DIAGNOSIS — E1142 Type 2 diabetes mellitus with diabetic polyneuropathy: Secondary | ICD-10-CM | POA: Diagnosis not present

## 2016-11-29 DIAGNOSIS — I25118 Atherosclerotic heart disease of native coronary artery with other forms of angina pectoris: Secondary | ICD-10-CM | POA: Diagnosis not present

## 2016-11-29 DIAGNOSIS — I10 Essential (primary) hypertension: Secondary | ICD-10-CM | POA: Diagnosis not present

## 2016-11-29 DIAGNOSIS — N186 End stage renal disease: Secondary | ICD-10-CM | POA: Diagnosis not present

## 2016-11-30 DIAGNOSIS — Z992 Dependence on renal dialysis: Secondary | ICD-10-CM | POA: Diagnosis not present

## 2016-11-30 DIAGNOSIS — N186 End stage renal disease: Secondary | ICD-10-CM | POA: Diagnosis not present

## 2016-11-30 DIAGNOSIS — D509 Iron deficiency anemia, unspecified: Secondary | ICD-10-CM | POA: Diagnosis not present

## 2016-11-30 DIAGNOSIS — N2581 Secondary hyperparathyroidism of renal origin: Secondary | ICD-10-CM | POA: Diagnosis not present

## 2016-12-01 ENCOUNTER — Ambulatory Visit: Payer: Medicare Other | Admitting: Cardiovascular Disease

## 2016-12-02 DIAGNOSIS — N2581 Secondary hyperparathyroidism of renal origin: Secondary | ICD-10-CM | POA: Diagnosis not present

## 2016-12-02 DIAGNOSIS — D509 Iron deficiency anemia, unspecified: Secondary | ICD-10-CM | POA: Diagnosis not present

## 2016-12-02 DIAGNOSIS — Z992 Dependence on renal dialysis: Secondary | ICD-10-CM | POA: Diagnosis not present

## 2016-12-02 DIAGNOSIS — N186 End stage renal disease: Secondary | ICD-10-CM | POA: Diagnosis not present

## 2016-12-05 DIAGNOSIS — D509 Iron deficiency anemia, unspecified: Secondary | ICD-10-CM | POA: Diagnosis not present

## 2016-12-05 DIAGNOSIS — N2581 Secondary hyperparathyroidism of renal origin: Secondary | ICD-10-CM | POA: Diagnosis not present

## 2016-12-05 DIAGNOSIS — Z992 Dependence on renal dialysis: Secondary | ICD-10-CM | POA: Diagnosis not present

## 2016-12-05 DIAGNOSIS — N186 End stage renal disease: Secondary | ICD-10-CM | POA: Diagnosis not present

## 2016-12-06 DIAGNOSIS — M79652 Pain in left thigh: Secondary | ICD-10-CM | POA: Diagnosis not present

## 2016-12-06 DIAGNOSIS — M79651 Pain in right thigh: Secondary | ICD-10-CM | POA: Diagnosis not present

## 2016-12-06 DIAGNOSIS — G894 Chronic pain syndrome: Secondary | ICD-10-CM | POA: Diagnosis not present

## 2016-12-06 DIAGNOSIS — M25561 Pain in right knee: Secondary | ICD-10-CM | POA: Diagnosis not present

## 2016-12-06 DIAGNOSIS — Z79891 Long term (current) use of opiate analgesic: Secondary | ICD-10-CM | POA: Diagnosis not present

## 2016-12-06 DIAGNOSIS — M25551 Pain in right hip: Secondary | ICD-10-CM | POA: Diagnosis not present

## 2016-12-06 DIAGNOSIS — M25552 Pain in left hip: Secondary | ICD-10-CM | POA: Diagnosis not present

## 2016-12-06 DIAGNOSIS — Z89511 Acquired absence of right leg below knee: Secondary | ICD-10-CM | POA: Diagnosis not present

## 2016-12-07 DIAGNOSIS — Z992 Dependence on renal dialysis: Secondary | ICD-10-CM | POA: Diagnosis not present

## 2016-12-07 DIAGNOSIS — D509 Iron deficiency anemia, unspecified: Secondary | ICD-10-CM | POA: Diagnosis not present

## 2016-12-07 DIAGNOSIS — N186 End stage renal disease: Secondary | ICD-10-CM | POA: Diagnosis not present

## 2016-12-07 DIAGNOSIS — N2581 Secondary hyperparathyroidism of renal origin: Secondary | ICD-10-CM | POA: Diagnosis not present

## 2016-12-09 DIAGNOSIS — Z992 Dependence on renal dialysis: Secondary | ICD-10-CM | POA: Diagnosis not present

## 2016-12-09 DIAGNOSIS — N186 End stage renal disease: Secondary | ICD-10-CM | POA: Diagnosis not present

## 2016-12-09 DIAGNOSIS — D509 Iron deficiency anemia, unspecified: Secondary | ICD-10-CM | POA: Diagnosis not present

## 2016-12-09 DIAGNOSIS — N2581 Secondary hyperparathyroidism of renal origin: Secondary | ICD-10-CM | POA: Diagnosis not present

## 2016-12-12 DIAGNOSIS — N186 End stage renal disease: Secondary | ICD-10-CM | POA: Diagnosis not present

## 2016-12-12 DIAGNOSIS — Z992 Dependence on renal dialysis: Secondary | ICD-10-CM | POA: Diagnosis not present

## 2016-12-12 DIAGNOSIS — N2581 Secondary hyperparathyroidism of renal origin: Secondary | ICD-10-CM | POA: Diagnosis not present

## 2016-12-12 DIAGNOSIS — D509 Iron deficiency anemia, unspecified: Secondary | ICD-10-CM | POA: Diagnosis not present

## 2016-12-13 ENCOUNTER — Ambulatory Visit: Payer: Medicare Other | Admitting: Cardiovascular Disease

## 2016-12-14 DIAGNOSIS — Z992 Dependence on renal dialysis: Secondary | ICD-10-CM | POA: Diagnosis not present

## 2016-12-14 DIAGNOSIS — N2581 Secondary hyperparathyroidism of renal origin: Secondary | ICD-10-CM | POA: Diagnosis not present

## 2016-12-14 DIAGNOSIS — D509 Iron deficiency anemia, unspecified: Secondary | ICD-10-CM | POA: Diagnosis not present

## 2016-12-14 DIAGNOSIS — N186 End stage renal disease: Secondary | ICD-10-CM | POA: Diagnosis not present

## 2016-12-17 DIAGNOSIS — D509 Iron deficiency anemia, unspecified: Secondary | ICD-10-CM | POA: Diagnosis not present

## 2016-12-17 DIAGNOSIS — Z992 Dependence on renal dialysis: Secondary | ICD-10-CM | POA: Diagnosis not present

## 2016-12-17 DIAGNOSIS — N2581 Secondary hyperparathyroidism of renal origin: Secondary | ICD-10-CM | POA: Diagnosis not present

## 2016-12-17 DIAGNOSIS — N186 End stage renal disease: Secondary | ICD-10-CM | POA: Diagnosis not present

## 2016-12-19 DIAGNOSIS — N186 End stage renal disease: Secondary | ICD-10-CM | POA: Diagnosis not present

## 2016-12-19 DIAGNOSIS — Z992 Dependence on renal dialysis: Secondary | ICD-10-CM | POA: Diagnosis not present

## 2016-12-19 DIAGNOSIS — N2581 Secondary hyperparathyroidism of renal origin: Secondary | ICD-10-CM | POA: Diagnosis not present

## 2016-12-19 DIAGNOSIS — D509 Iron deficiency anemia, unspecified: Secondary | ICD-10-CM | POA: Diagnosis not present

## 2016-12-20 DIAGNOSIS — G894 Chronic pain syndrome: Secondary | ICD-10-CM | POA: Diagnosis not present

## 2016-12-20 DIAGNOSIS — M545 Low back pain: Secondary | ICD-10-CM | POA: Diagnosis not present

## 2016-12-21 DIAGNOSIS — N186 End stage renal disease: Secondary | ICD-10-CM | POA: Diagnosis not present

## 2016-12-21 DIAGNOSIS — D509 Iron deficiency anemia, unspecified: Secondary | ICD-10-CM | POA: Diagnosis not present

## 2016-12-21 DIAGNOSIS — Z992 Dependence on renal dialysis: Secondary | ICD-10-CM | POA: Diagnosis not present

## 2016-12-21 DIAGNOSIS — N2581 Secondary hyperparathyroidism of renal origin: Secondary | ICD-10-CM | POA: Diagnosis not present

## 2016-12-23 DIAGNOSIS — N2581 Secondary hyperparathyroidism of renal origin: Secondary | ICD-10-CM | POA: Diagnosis not present

## 2016-12-23 DIAGNOSIS — D509 Iron deficiency anemia, unspecified: Secondary | ICD-10-CM | POA: Diagnosis not present

## 2016-12-23 DIAGNOSIS — N186 End stage renal disease: Secondary | ICD-10-CM | POA: Diagnosis not present

## 2016-12-23 DIAGNOSIS — Z992 Dependence on renal dialysis: Secondary | ICD-10-CM | POA: Diagnosis not present

## 2016-12-26 DIAGNOSIS — D509 Iron deficiency anemia, unspecified: Secondary | ICD-10-CM | POA: Diagnosis not present

## 2016-12-26 DIAGNOSIS — N186 End stage renal disease: Secondary | ICD-10-CM | POA: Diagnosis not present

## 2016-12-26 DIAGNOSIS — Z992 Dependence on renal dialysis: Secondary | ICD-10-CM | POA: Diagnosis not present

## 2016-12-26 DIAGNOSIS — N2581 Secondary hyperparathyroidism of renal origin: Secondary | ICD-10-CM | POA: Diagnosis not present

## 2016-12-28 DIAGNOSIS — N2581 Secondary hyperparathyroidism of renal origin: Secondary | ICD-10-CM | POA: Diagnosis not present

## 2016-12-28 DIAGNOSIS — Z992 Dependence on renal dialysis: Secondary | ICD-10-CM | POA: Diagnosis not present

## 2016-12-28 DIAGNOSIS — N186 End stage renal disease: Secondary | ICD-10-CM | POA: Diagnosis not present

## 2016-12-28 DIAGNOSIS — D509 Iron deficiency anemia, unspecified: Secondary | ICD-10-CM | POA: Diagnosis not present

## 2016-12-30 DIAGNOSIS — N2581 Secondary hyperparathyroidism of renal origin: Secondary | ICD-10-CM | POA: Diagnosis not present

## 2016-12-30 DIAGNOSIS — Z992 Dependence on renal dialysis: Secondary | ICD-10-CM | POA: Diagnosis not present

## 2016-12-30 DIAGNOSIS — D509 Iron deficiency anemia, unspecified: Secondary | ICD-10-CM | POA: Diagnosis not present

## 2016-12-30 DIAGNOSIS — N186 End stage renal disease: Secondary | ICD-10-CM | POA: Diagnosis not present

## 2017-01-02 DIAGNOSIS — Z992 Dependence on renal dialysis: Secondary | ICD-10-CM | POA: Diagnosis not present

## 2017-01-02 DIAGNOSIS — N186 End stage renal disease: Secondary | ICD-10-CM | POA: Diagnosis not present

## 2017-01-02 DIAGNOSIS — D509 Iron deficiency anemia, unspecified: Secondary | ICD-10-CM | POA: Diagnosis not present

## 2017-01-02 DIAGNOSIS — N2581 Secondary hyperparathyroidism of renal origin: Secondary | ICD-10-CM | POA: Diagnosis not present

## 2017-01-04 DIAGNOSIS — N2581 Secondary hyperparathyroidism of renal origin: Secondary | ICD-10-CM | POA: Diagnosis not present

## 2017-01-04 DIAGNOSIS — N186 End stage renal disease: Secondary | ICD-10-CM | POA: Diagnosis not present

## 2017-01-04 DIAGNOSIS — Z992 Dependence on renal dialysis: Secondary | ICD-10-CM | POA: Diagnosis not present

## 2017-01-04 DIAGNOSIS — D509 Iron deficiency anemia, unspecified: Secondary | ICD-10-CM | POA: Diagnosis not present

## 2017-01-05 DIAGNOSIS — S88112S Complete traumatic amputation at level between knee and ankle, left lower leg, sequela: Secondary | ICD-10-CM | POA: Diagnosis not present

## 2017-01-05 DIAGNOSIS — L2089 Other atopic dermatitis: Secondary | ICD-10-CM | POA: Diagnosis not present

## 2017-01-06 DIAGNOSIS — Z992 Dependence on renal dialysis: Secondary | ICD-10-CM | POA: Diagnosis not present

## 2017-01-06 DIAGNOSIS — D509 Iron deficiency anemia, unspecified: Secondary | ICD-10-CM | POA: Diagnosis not present

## 2017-01-06 DIAGNOSIS — N186 End stage renal disease: Secondary | ICD-10-CM | POA: Diagnosis not present

## 2017-01-06 DIAGNOSIS — N2581 Secondary hyperparathyroidism of renal origin: Secondary | ICD-10-CM | POA: Diagnosis not present

## 2017-01-10 ENCOUNTER — Ambulatory Visit: Payer: Medicare Other | Admitting: Cardiovascular Disease

## 2017-01-11 DIAGNOSIS — Z992 Dependence on renal dialysis: Secondary | ICD-10-CM | POA: Diagnosis not present

## 2017-01-11 DIAGNOSIS — D509 Iron deficiency anemia, unspecified: Secondary | ICD-10-CM | POA: Diagnosis not present

## 2017-01-11 DIAGNOSIS — Z794 Long term (current) use of insulin: Secondary | ICD-10-CM | POA: Diagnosis not present

## 2017-01-11 DIAGNOSIS — N2581 Secondary hyperparathyroidism of renal origin: Secondary | ICD-10-CM | POA: Diagnosis not present

## 2017-01-11 DIAGNOSIS — E109 Type 1 diabetes mellitus without complications: Secondary | ICD-10-CM | POA: Diagnosis not present

## 2017-01-11 DIAGNOSIS — N186 End stage renal disease: Secondary | ICD-10-CM | POA: Diagnosis not present

## 2017-01-12 DIAGNOSIS — M25551 Pain in right hip: Secondary | ICD-10-CM | POA: Diagnosis not present

## 2017-01-12 DIAGNOSIS — G894 Chronic pain syndrome: Secondary | ICD-10-CM | POA: Diagnosis not present

## 2017-01-12 DIAGNOSIS — M25552 Pain in left hip: Secondary | ICD-10-CM | POA: Diagnosis not present

## 2017-01-12 DIAGNOSIS — Z79891 Long term (current) use of opiate analgesic: Secondary | ICD-10-CM | POA: Diagnosis not present

## 2017-01-13 DIAGNOSIS — N186 End stage renal disease: Secondary | ICD-10-CM | POA: Diagnosis not present

## 2017-01-13 DIAGNOSIS — D509 Iron deficiency anemia, unspecified: Secondary | ICD-10-CM | POA: Diagnosis not present

## 2017-01-13 DIAGNOSIS — Z992 Dependence on renal dialysis: Secondary | ICD-10-CM | POA: Diagnosis not present

## 2017-01-13 DIAGNOSIS — N2581 Secondary hyperparathyroidism of renal origin: Secondary | ICD-10-CM | POA: Diagnosis not present

## 2017-01-14 DIAGNOSIS — N186 End stage renal disease: Secondary | ICD-10-CM | POA: Diagnosis not present

## 2017-01-14 DIAGNOSIS — Z992 Dependence on renal dialysis: Secondary | ICD-10-CM | POA: Diagnosis not present

## 2017-01-16 DIAGNOSIS — N2581 Secondary hyperparathyroidism of renal origin: Secondary | ICD-10-CM | POA: Diagnosis not present

## 2017-01-16 DIAGNOSIS — N186 End stage renal disease: Secondary | ICD-10-CM | POA: Diagnosis not present

## 2017-01-16 DIAGNOSIS — D509 Iron deficiency anemia, unspecified: Secondary | ICD-10-CM | POA: Diagnosis not present

## 2017-01-16 DIAGNOSIS — Z992 Dependence on renal dialysis: Secondary | ICD-10-CM | POA: Diagnosis not present

## 2017-01-17 DIAGNOSIS — Z79891 Long term (current) use of opiate analgesic: Secondary | ICD-10-CM | POA: Diagnosis not present

## 2017-01-17 DIAGNOSIS — Z79899 Other long term (current) drug therapy: Secondary | ICD-10-CM | POA: Diagnosis not present

## 2017-01-17 DIAGNOSIS — G894 Chronic pain syndrome: Secondary | ICD-10-CM | POA: Diagnosis not present

## 2017-01-17 DIAGNOSIS — M545 Low back pain: Secondary | ICD-10-CM | POA: Diagnosis not present

## 2017-01-18 DIAGNOSIS — D509 Iron deficiency anemia, unspecified: Secondary | ICD-10-CM | POA: Diagnosis not present

## 2017-01-18 DIAGNOSIS — N186 End stage renal disease: Secondary | ICD-10-CM | POA: Diagnosis not present

## 2017-01-18 DIAGNOSIS — N2581 Secondary hyperparathyroidism of renal origin: Secondary | ICD-10-CM | POA: Diagnosis not present

## 2017-01-18 DIAGNOSIS — Z992 Dependence on renal dialysis: Secondary | ICD-10-CM | POA: Diagnosis not present

## 2017-01-20 DIAGNOSIS — N186 End stage renal disease: Secondary | ICD-10-CM | POA: Diagnosis not present

## 2017-01-20 DIAGNOSIS — D509 Iron deficiency anemia, unspecified: Secondary | ICD-10-CM | POA: Diagnosis not present

## 2017-01-20 DIAGNOSIS — N2581 Secondary hyperparathyroidism of renal origin: Secondary | ICD-10-CM | POA: Diagnosis not present

## 2017-01-20 DIAGNOSIS — Z992 Dependence on renal dialysis: Secondary | ICD-10-CM | POA: Diagnosis not present

## 2017-01-23 DIAGNOSIS — Z992 Dependence on renal dialysis: Secondary | ICD-10-CM | POA: Diagnosis not present

## 2017-01-23 DIAGNOSIS — N186 End stage renal disease: Secondary | ICD-10-CM | POA: Diagnosis not present

## 2017-01-23 DIAGNOSIS — N2581 Secondary hyperparathyroidism of renal origin: Secondary | ICD-10-CM | POA: Diagnosis not present

## 2017-01-23 DIAGNOSIS — D509 Iron deficiency anemia, unspecified: Secondary | ICD-10-CM | POA: Diagnosis not present

## 2017-01-25 DIAGNOSIS — N186 End stage renal disease: Secondary | ICD-10-CM | POA: Diagnosis not present

## 2017-01-25 DIAGNOSIS — D509 Iron deficiency anemia, unspecified: Secondary | ICD-10-CM | POA: Diagnosis not present

## 2017-01-25 DIAGNOSIS — N2581 Secondary hyperparathyroidism of renal origin: Secondary | ICD-10-CM | POA: Diagnosis not present

## 2017-01-25 DIAGNOSIS — Z992 Dependence on renal dialysis: Secondary | ICD-10-CM | POA: Diagnosis not present

## 2017-01-27 DIAGNOSIS — N2581 Secondary hyperparathyroidism of renal origin: Secondary | ICD-10-CM | POA: Diagnosis not present

## 2017-01-27 DIAGNOSIS — D509 Iron deficiency anemia, unspecified: Secondary | ICD-10-CM | POA: Diagnosis not present

## 2017-01-27 DIAGNOSIS — N186 End stage renal disease: Secondary | ICD-10-CM | POA: Diagnosis not present

## 2017-01-27 DIAGNOSIS — Z992 Dependence on renal dialysis: Secondary | ICD-10-CM | POA: Diagnosis not present

## 2017-01-30 DIAGNOSIS — Z992 Dependence on renal dialysis: Secondary | ICD-10-CM | POA: Diagnosis not present

## 2017-01-30 DIAGNOSIS — N186 End stage renal disease: Secondary | ICD-10-CM | POA: Diagnosis not present

## 2017-01-30 DIAGNOSIS — D509 Iron deficiency anemia, unspecified: Secondary | ICD-10-CM | POA: Diagnosis not present

## 2017-01-30 DIAGNOSIS — N2581 Secondary hyperparathyroidism of renal origin: Secondary | ICD-10-CM | POA: Diagnosis not present

## 2017-02-01 DIAGNOSIS — D509 Iron deficiency anemia, unspecified: Secondary | ICD-10-CM | POA: Diagnosis not present

## 2017-02-01 DIAGNOSIS — Z992 Dependence on renal dialysis: Secondary | ICD-10-CM | POA: Diagnosis not present

## 2017-02-01 DIAGNOSIS — N186 End stage renal disease: Secondary | ICD-10-CM | POA: Diagnosis not present

## 2017-02-01 DIAGNOSIS — N2581 Secondary hyperparathyroidism of renal origin: Secondary | ICD-10-CM | POA: Diagnosis not present

## 2017-02-02 ENCOUNTER — Ambulatory Visit: Payer: Medicare Other | Admitting: Cardiovascular Disease

## 2017-02-03 DIAGNOSIS — N186 End stage renal disease: Secondary | ICD-10-CM | POA: Diagnosis not present

## 2017-02-03 DIAGNOSIS — D509 Iron deficiency anemia, unspecified: Secondary | ICD-10-CM | POA: Diagnosis not present

## 2017-02-03 DIAGNOSIS — Z992 Dependence on renal dialysis: Secondary | ICD-10-CM | POA: Diagnosis not present

## 2017-02-03 DIAGNOSIS — N2581 Secondary hyperparathyroidism of renal origin: Secondary | ICD-10-CM | POA: Diagnosis not present

## 2017-02-06 DIAGNOSIS — D509 Iron deficiency anemia, unspecified: Secondary | ICD-10-CM | POA: Diagnosis not present

## 2017-02-06 DIAGNOSIS — Z992 Dependence on renal dialysis: Secondary | ICD-10-CM | POA: Diagnosis not present

## 2017-02-06 DIAGNOSIS — N2581 Secondary hyperparathyroidism of renal origin: Secondary | ICD-10-CM | POA: Diagnosis not present

## 2017-02-06 DIAGNOSIS — N186 End stage renal disease: Secondary | ICD-10-CM | POA: Diagnosis not present

## 2017-02-07 DIAGNOSIS — N186 End stage renal disease: Secondary | ICD-10-CM | POA: Diagnosis not present

## 2017-02-07 DIAGNOSIS — E8770 Fluid overload, unspecified: Secondary | ICD-10-CM | POA: Diagnosis not present

## 2017-02-07 DIAGNOSIS — Z992 Dependence on renal dialysis: Secondary | ICD-10-CM | POA: Diagnosis not present

## 2017-02-08 DIAGNOSIS — D509 Iron deficiency anemia, unspecified: Secondary | ICD-10-CM | POA: Diagnosis not present

## 2017-02-08 DIAGNOSIS — Z992 Dependence on renal dialysis: Secondary | ICD-10-CM | POA: Diagnosis not present

## 2017-02-08 DIAGNOSIS — N2581 Secondary hyperparathyroidism of renal origin: Secondary | ICD-10-CM | POA: Diagnosis not present

## 2017-02-08 DIAGNOSIS — N186 End stage renal disease: Secondary | ICD-10-CM | POA: Diagnosis not present

## 2017-02-09 DIAGNOSIS — Z89511 Acquired absence of right leg below knee: Secondary | ICD-10-CM | POA: Diagnosis not present

## 2017-02-09 DIAGNOSIS — K219 Gastro-esophageal reflux disease without esophagitis: Secondary | ICD-10-CM | POA: Diagnosis not present

## 2017-02-09 DIAGNOSIS — L89313 Pressure ulcer of right buttock, stage 3: Secondary | ICD-10-CM | POA: Diagnosis not present

## 2017-02-09 DIAGNOSIS — Z89512 Acquired absence of left leg below knee: Secondary | ICD-10-CM | POA: Diagnosis not present

## 2017-02-09 DIAGNOSIS — Z833 Family history of diabetes mellitus: Secondary | ICD-10-CM | POA: Diagnosis not present

## 2017-02-09 DIAGNOSIS — Z794 Long term (current) use of insulin: Secondary | ICD-10-CM | POA: Diagnosis not present

## 2017-02-09 DIAGNOSIS — Z992 Dependence on renal dialysis: Secondary | ICD-10-CM | POA: Diagnosis not present

## 2017-02-09 DIAGNOSIS — F17200 Nicotine dependence, unspecified, uncomplicated: Secondary | ICD-10-CM | POA: Diagnosis not present

## 2017-02-09 DIAGNOSIS — E1151 Type 2 diabetes mellitus with diabetic peripheral angiopathy without gangrene: Secondary | ICD-10-CM | POA: Diagnosis not present

## 2017-02-09 DIAGNOSIS — Z8249 Family history of ischemic heart disease and other diseases of the circulatory system: Secondary | ICD-10-CM | POA: Diagnosis not present

## 2017-02-09 DIAGNOSIS — N186 End stage renal disease: Secondary | ICD-10-CM | POA: Diagnosis not present

## 2017-02-09 DIAGNOSIS — I129 Hypertensive chronic kidney disease with stage 1 through stage 4 chronic kidney disease, or unspecified chronic kidney disease: Secondary | ICD-10-CM | POA: Diagnosis not present

## 2017-02-09 DIAGNOSIS — E1122 Type 2 diabetes mellitus with diabetic chronic kidney disease: Secondary | ICD-10-CM | POA: Diagnosis not present

## 2017-02-09 DIAGNOSIS — Z7902 Long term (current) use of antithrombotics/antiplatelets: Secondary | ICD-10-CM | POA: Diagnosis not present

## 2017-02-09 DIAGNOSIS — Z79899 Other long term (current) drug therapy: Secondary | ICD-10-CM | POA: Diagnosis not present

## 2017-02-09 DIAGNOSIS — Z823 Family history of stroke: Secondary | ICD-10-CM | POA: Diagnosis not present

## 2017-02-09 DIAGNOSIS — L89322 Pressure ulcer of left buttock, stage 2: Secondary | ICD-10-CM | POA: Diagnosis not present

## 2017-02-09 DIAGNOSIS — Z809 Family history of malignant neoplasm, unspecified: Secondary | ICD-10-CM | POA: Diagnosis not present

## 2017-02-09 DIAGNOSIS — N189 Chronic kidney disease, unspecified: Secondary | ICD-10-CM | POA: Diagnosis not present

## 2017-02-10 DIAGNOSIS — Z992 Dependence on renal dialysis: Secondary | ICD-10-CM | POA: Diagnosis not present

## 2017-02-10 DIAGNOSIS — D509 Iron deficiency anemia, unspecified: Secondary | ICD-10-CM | POA: Diagnosis not present

## 2017-02-10 DIAGNOSIS — N2581 Secondary hyperparathyroidism of renal origin: Secondary | ICD-10-CM | POA: Diagnosis not present

## 2017-02-10 DIAGNOSIS — N186 End stage renal disease: Secondary | ICD-10-CM | POA: Diagnosis not present

## 2017-02-13 DIAGNOSIS — D509 Iron deficiency anemia, unspecified: Secondary | ICD-10-CM | POA: Diagnosis not present

## 2017-02-13 DIAGNOSIS — N186 End stage renal disease: Secondary | ICD-10-CM | POA: Diagnosis not present

## 2017-02-13 DIAGNOSIS — Z992 Dependence on renal dialysis: Secondary | ICD-10-CM | POA: Diagnosis not present

## 2017-02-13 DIAGNOSIS — N2581 Secondary hyperparathyroidism of renal origin: Secondary | ICD-10-CM | POA: Diagnosis not present

## 2017-02-14 DIAGNOSIS — M545 Low back pain: Secondary | ICD-10-CM | POA: Diagnosis not present

## 2017-02-14 DIAGNOSIS — G894 Chronic pain syndrome: Secondary | ICD-10-CM | POA: Diagnosis not present

## 2017-02-14 DIAGNOSIS — M25559 Pain in unspecified hip: Secondary | ICD-10-CM | POA: Diagnosis not present

## 2017-02-14 DIAGNOSIS — M169 Osteoarthritis of hip, unspecified: Secondary | ICD-10-CM | POA: Diagnosis not present

## 2017-02-15 DIAGNOSIS — D509 Iron deficiency anemia, unspecified: Secondary | ICD-10-CM | POA: Diagnosis not present

## 2017-02-15 DIAGNOSIS — Z992 Dependence on renal dialysis: Secondary | ICD-10-CM | POA: Diagnosis not present

## 2017-02-15 DIAGNOSIS — N186 End stage renal disease: Secondary | ICD-10-CM | POA: Diagnosis not present

## 2017-02-17 DIAGNOSIS — Z992 Dependence on renal dialysis: Secondary | ICD-10-CM | POA: Diagnosis not present

## 2017-02-17 DIAGNOSIS — N186 End stage renal disease: Secondary | ICD-10-CM | POA: Diagnosis not present

## 2017-02-17 DIAGNOSIS — D509 Iron deficiency anemia, unspecified: Secondary | ICD-10-CM | POA: Diagnosis not present

## 2017-02-20 DIAGNOSIS — Z992 Dependence on renal dialysis: Secondary | ICD-10-CM | POA: Diagnosis not present

## 2017-02-20 DIAGNOSIS — D509 Iron deficiency anemia, unspecified: Secondary | ICD-10-CM | POA: Diagnosis not present

## 2017-02-20 DIAGNOSIS — N186 End stage renal disease: Secondary | ICD-10-CM | POA: Diagnosis not present

## 2017-02-21 DIAGNOSIS — N186 End stage renal disease: Secondary | ICD-10-CM | POA: Diagnosis not present

## 2017-02-21 DIAGNOSIS — Z992 Dependence on renal dialysis: Secondary | ICD-10-CM | POA: Diagnosis not present

## 2017-02-21 DIAGNOSIS — L89322 Pressure ulcer of left buttock, stage 2: Secondary | ICD-10-CM | POA: Diagnosis not present

## 2017-02-21 DIAGNOSIS — L89319 Pressure ulcer of right buttock, unspecified stage: Secondary | ICD-10-CM | POA: Diagnosis not present

## 2017-02-21 DIAGNOSIS — E1122 Type 2 diabetes mellitus with diabetic chronic kidney disease: Secondary | ICD-10-CM | POA: Diagnosis not present

## 2017-02-21 DIAGNOSIS — L89313 Pressure ulcer of right buttock, stage 3: Secondary | ICD-10-CM | POA: Diagnosis not present

## 2017-02-22 DIAGNOSIS — N186 End stage renal disease: Secondary | ICD-10-CM | POA: Diagnosis not present

## 2017-02-22 DIAGNOSIS — Z992 Dependence on renal dialysis: Secondary | ICD-10-CM | POA: Diagnosis not present

## 2017-02-22 DIAGNOSIS — D509 Iron deficiency anemia, unspecified: Secondary | ICD-10-CM | POA: Diagnosis not present

## 2017-02-24 DIAGNOSIS — D509 Iron deficiency anemia, unspecified: Secondary | ICD-10-CM | POA: Diagnosis not present

## 2017-02-24 DIAGNOSIS — Z992 Dependence on renal dialysis: Secondary | ICD-10-CM | POA: Diagnosis not present

## 2017-02-24 DIAGNOSIS — N186 End stage renal disease: Secondary | ICD-10-CM | POA: Diagnosis not present

## 2017-02-27 DIAGNOSIS — N186 End stage renal disease: Secondary | ICD-10-CM | POA: Diagnosis not present

## 2017-02-27 DIAGNOSIS — D509 Iron deficiency anemia, unspecified: Secondary | ICD-10-CM | POA: Diagnosis not present

## 2017-02-27 DIAGNOSIS — Z992 Dependence on renal dialysis: Secondary | ICD-10-CM | POA: Diagnosis not present

## 2017-03-01 DIAGNOSIS — N186 End stage renal disease: Secondary | ICD-10-CM | POA: Diagnosis not present

## 2017-03-01 DIAGNOSIS — Z992 Dependence on renal dialysis: Secondary | ICD-10-CM | POA: Diagnosis not present

## 2017-03-01 DIAGNOSIS — D509 Iron deficiency anemia, unspecified: Secondary | ICD-10-CM | POA: Diagnosis not present

## 2017-03-03 DIAGNOSIS — D509 Iron deficiency anemia, unspecified: Secondary | ICD-10-CM | POA: Diagnosis not present

## 2017-03-03 DIAGNOSIS — Z992 Dependence on renal dialysis: Secondary | ICD-10-CM | POA: Diagnosis not present

## 2017-03-03 DIAGNOSIS — N186 End stage renal disease: Secondary | ICD-10-CM | POA: Diagnosis not present

## 2017-03-06 DIAGNOSIS — D509 Iron deficiency anemia, unspecified: Secondary | ICD-10-CM | POA: Diagnosis not present

## 2017-03-06 DIAGNOSIS — N186 End stage renal disease: Secondary | ICD-10-CM | POA: Diagnosis not present

## 2017-03-06 DIAGNOSIS — Z992 Dependence on renal dialysis: Secondary | ICD-10-CM | POA: Diagnosis not present

## 2017-03-08 DIAGNOSIS — D509 Iron deficiency anemia, unspecified: Secondary | ICD-10-CM | POA: Diagnosis not present

## 2017-03-08 DIAGNOSIS — Z992 Dependence on renal dialysis: Secondary | ICD-10-CM | POA: Diagnosis not present

## 2017-03-08 DIAGNOSIS — N186 End stage renal disease: Secondary | ICD-10-CM | POA: Diagnosis not present

## 2017-03-09 DIAGNOSIS — E113593 Type 2 diabetes mellitus with proliferative diabetic retinopathy without macular edema, bilateral: Secondary | ICD-10-CM | POA: Diagnosis not present

## 2017-03-09 DIAGNOSIS — H211X2 Other vascular disorders of iris and ciliary body, left eye: Secondary | ICD-10-CM | POA: Diagnosis not present

## 2017-03-10 DIAGNOSIS — Z992 Dependence on renal dialysis: Secondary | ICD-10-CM | POA: Diagnosis not present

## 2017-03-10 DIAGNOSIS — D509 Iron deficiency anemia, unspecified: Secondary | ICD-10-CM | POA: Diagnosis not present

## 2017-03-10 DIAGNOSIS — N186 End stage renal disease: Secondary | ICD-10-CM | POA: Diagnosis not present

## 2017-03-13 DIAGNOSIS — D509 Iron deficiency anemia, unspecified: Secondary | ICD-10-CM | POA: Diagnosis not present

## 2017-03-13 DIAGNOSIS — N186 End stage renal disease: Secondary | ICD-10-CM | POA: Diagnosis not present

## 2017-03-13 DIAGNOSIS — Z992 Dependence on renal dialysis: Secondary | ICD-10-CM | POA: Diagnosis not present

## 2017-03-15 DIAGNOSIS — Z992 Dependence on renal dialysis: Secondary | ICD-10-CM | POA: Diagnosis not present

## 2017-03-15 DIAGNOSIS — D509 Iron deficiency anemia, unspecified: Secondary | ICD-10-CM | POA: Diagnosis not present

## 2017-03-15 DIAGNOSIS — N186 End stage renal disease: Secondary | ICD-10-CM | POA: Diagnosis not present

## 2017-03-16 DIAGNOSIS — N186 End stage renal disease: Secondary | ICD-10-CM | POA: Diagnosis not present

## 2017-03-16 DIAGNOSIS — Z992 Dependence on renal dialysis: Secondary | ICD-10-CM | POA: Diagnosis not present

## 2017-03-17 DIAGNOSIS — M545 Low back pain: Secondary | ICD-10-CM | POA: Diagnosis not present

## 2017-03-17 DIAGNOSIS — M25559 Pain in unspecified hip: Secondary | ICD-10-CM | POA: Diagnosis not present

## 2017-03-17 DIAGNOSIS — G894 Chronic pain syndrome: Secondary | ICD-10-CM | POA: Diagnosis not present

## 2017-03-17 DIAGNOSIS — M169 Osteoarthritis of hip, unspecified: Secondary | ICD-10-CM | POA: Diagnosis not present

## 2017-03-18 DIAGNOSIS — Z992 Dependence on renal dialysis: Secondary | ICD-10-CM | POA: Diagnosis not present

## 2017-03-18 DIAGNOSIS — N186 End stage renal disease: Secondary | ICD-10-CM | POA: Diagnosis not present

## 2017-03-18 DIAGNOSIS — D509 Iron deficiency anemia, unspecified: Secondary | ICD-10-CM | POA: Diagnosis not present

## 2017-03-18 DIAGNOSIS — N2581 Secondary hyperparathyroidism of renal origin: Secondary | ICD-10-CM | POA: Diagnosis not present

## 2017-03-20 DIAGNOSIS — D509 Iron deficiency anemia, unspecified: Secondary | ICD-10-CM | POA: Diagnosis not present

## 2017-03-20 DIAGNOSIS — N2581 Secondary hyperparathyroidism of renal origin: Secondary | ICD-10-CM | POA: Diagnosis not present

## 2017-03-20 DIAGNOSIS — Z992 Dependence on renal dialysis: Secondary | ICD-10-CM | POA: Diagnosis not present

## 2017-03-20 DIAGNOSIS — N186 End stage renal disease: Secondary | ICD-10-CM | POA: Diagnosis not present

## 2017-03-22 DIAGNOSIS — N2581 Secondary hyperparathyroidism of renal origin: Secondary | ICD-10-CM | POA: Diagnosis not present

## 2017-03-22 DIAGNOSIS — Z992 Dependence on renal dialysis: Secondary | ICD-10-CM | POA: Diagnosis not present

## 2017-03-22 DIAGNOSIS — D509 Iron deficiency anemia, unspecified: Secondary | ICD-10-CM | POA: Diagnosis not present

## 2017-03-22 DIAGNOSIS — N186 End stage renal disease: Secondary | ICD-10-CM | POA: Diagnosis not present

## 2017-03-24 DIAGNOSIS — Z992 Dependence on renal dialysis: Secondary | ICD-10-CM | POA: Diagnosis not present

## 2017-03-24 DIAGNOSIS — N186 End stage renal disease: Secondary | ICD-10-CM | POA: Diagnosis not present

## 2017-03-24 DIAGNOSIS — N2581 Secondary hyperparathyroidism of renal origin: Secondary | ICD-10-CM | POA: Diagnosis not present

## 2017-03-24 DIAGNOSIS — D509 Iron deficiency anemia, unspecified: Secondary | ICD-10-CM | POA: Diagnosis not present

## 2017-03-27 DIAGNOSIS — Z992 Dependence on renal dialysis: Secondary | ICD-10-CM | POA: Diagnosis not present

## 2017-03-27 DIAGNOSIS — D509 Iron deficiency anemia, unspecified: Secondary | ICD-10-CM | POA: Diagnosis not present

## 2017-03-27 DIAGNOSIS — N2581 Secondary hyperparathyroidism of renal origin: Secondary | ICD-10-CM | POA: Diagnosis not present

## 2017-03-27 DIAGNOSIS — N186 End stage renal disease: Secondary | ICD-10-CM | POA: Diagnosis not present

## 2017-03-29 DIAGNOSIS — D509 Iron deficiency anemia, unspecified: Secondary | ICD-10-CM | POA: Diagnosis not present

## 2017-03-29 DIAGNOSIS — N186 End stage renal disease: Secondary | ICD-10-CM | POA: Diagnosis not present

## 2017-03-29 DIAGNOSIS — Z992 Dependence on renal dialysis: Secondary | ICD-10-CM | POA: Diagnosis not present

## 2017-03-29 DIAGNOSIS — N2581 Secondary hyperparathyroidism of renal origin: Secondary | ICD-10-CM | POA: Diagnosis not present

## 2017-03-31 DIAGNOSIS — D509 Iron deficiency anemia, unspecified: Secondary | ICD-10-CM | POA: Diagnosis not present

## 2017-03-31 DIAGNOSIS — Z992 Dependence on renal dialysis: Secondary | ICD-10-CM | POA: Diagnosis not present

## 2017-03-31 DIAGNOSIS — N186 End stage renal disease: Secondary | ICD-10-CM | POA: Diagnosis not present

## 2017-03-31 DIAGNOSIS — N2581 Secondary hyperparathyroidism of renal origin: Secondary | ICD-10-CM | POA: Diagnosis not present

## 2017-04-03 DIAGNOSIS — Z992 Dependence on renal dialysis: Secondary | ICD-10-CM | POA: Diagnosis not present

## 2017-04-03 DIAGNOSIS — N2581 Secondary hyperparathyroidism of renal origin: Secondary | ICD-10-CM | POA: Diagnosis not present

## 2017-04-03 DIAGNOSIS — N186 End stage renal disease: Secondary | ICD-10-CM | POA: Diagnosis not present

## 2017-04-03 DIAGNOSIS — D509 Iron deficiency anemia, unspecified: Secondary | ICD-10-CM | POA: Diagnosis not present

## 2017-04-04 DIAGNOSIS — M545 Low back pain: Secondary | ICD-10-CM | POA: Diagnosis not present

## 2017-04-04 DIAGNOSIS — M25551 Pain in right hip: Secondary | ICD-10-CM | POA: Diagnosis not present

## 2017-04-04 DIAGNOSIS — M25552 Pain in left hip: Secondary | ICD-10-CM | POA: Diagnosis not present

## 2017-04-05 DIAGNOSIS — N186 End stage renal disease: Secondary | ICD-10-CM | POA: Diagnosis not present

## 2017-04-05 DIAGNOSIS — N2581 Secondary hyperparathyroidism of renal origin: Secondary | ICD-10-CM | POA: Diagnosis not present

## 2017-04-05 DIAGNOSIS — Z992 Dependence on renal dialysis: Secondary | ICD-10-CM | POA: Diagnosis not present

## 2017-04-05 DIAGNOSIS — D509 Iron deficiency anemia, unspecified: Secondary | ICD-10-CM | POA: Diagnosis not present

## 2017-04-07 DIAGNOSIS — Z992 Dependence on renal dialysis: Secondary | ICD-10-CM | POA: Diagnosis not present

## 2017-04-07 DIAGNOSIS — N186 End stage renal disease: Secondary | ICD-10-CM | POA: Diagnosis not present

## 2017-04-07 DIAGNOSIS — N2581 Secondary hyperparathyroidism of renal origin: Secondary | ICD-10-CM | POA: Diagnosis not present

## 2017-04-07 DIAGNOSIS — D509 Iron deficiency anemia, unspecified: Secondary | ICD-10-CM | POA: Diagnosis not present

## 2017-04-10 DIAGNOSIS — Z992 Dependence on renal dialysis: Secondary | ICD-10-CM | POA: Diagnosis not present

## 2017-04-10 DIAGNOSIS — N2581 Secondary hyperparathyroidism of renal origin: Secondary | ICD-10-CM | POA: Diagnosis not present

## 2017-04-10 DIAGNOSIS — N186 End stage renal disease: Secondary | ICD-10-CM | POA: Diagnosis not present

## 2017-04-10 DIAGNOSIS — E109 Type 1 diabetes mellitus without complications: Secondary | ICD-10-CM | POA: Diagnosis not present

## 2017-04-10 DIAGNOSIS — Z794 Long term (current) use of insulin: Secondary | ICD-10-CM | POA: Diagnosis not present

## 2017-04-10 DIAGNOSIS — D509 Iron deficiency anemia, unspecified: Secondary | ICD-10-CM | POA: Diagnosis not present

## 2017-04-11 ENCOUNTER — Ambulatory Visit (INDEPENDENT_AMBULATORY_CARE_PROVIDER_SITE_OTHER): Payer: Medicare Other | Admitting: Cardiovascular Disease

## 2017-04-11 ENCOUNTER — Encounter: Payer: Self-pay | Admitting: Cardiovascular Disease

## 2017-04-11 VITALS — BP 120/68 | HR 64 | Ht <= 58 in | Wt 188.0 lb

## 2017-04-11 DIAGNOSIS — I252 Old myocardial infarction: Secondary | ICD-10-CM | POA: Diagnosis not present

## 2017-04-11 DIAGNOSIS — Z01818 Encounter for other preprocedural examination: Secondary | ICD-10-CM | POA: Diagnosis not present

## 2017-04-11 DIAGNOSIS — E782 Mixed hyperlipidemia: Secondary | ICD-10-CM

## 2017-04-11 DIAGNOSIS — I25118 Atherosclerotic heart disease of native coronary artery with other forms of angina pectoris: Secondary | ICD-10-CM

## 2017-04-11 DIAGNOSIS — I10 Essential (primary) hypertension: Secondary | ICD-10-CM

## 2017-04-11 DIAGNOSIS — Z955 Presence of coronary angioplasty implant and graft: Secondary | ICD-10-CM | POA: Diagnosis not present

## 2017-04-11 MED ORDER — ISOSORBIDE MONONITRATE ER 30 MG PO TB24: 30.0000 mg | ORAL_TABLET | Freq: Every day | ORAL | 3 refills | Status: DC

## 2017-04-11 MED ORDER — ATORVASTATIN CALCIUM 40 MG PO TABS: 40.0000 mg | ORAL_TABLET | Freq: Every day | ORAL | 3 refills | Status: DC

## 2017-04-11 NOTE — Patient Instructions (Signed)
Medication Instructions:  Your physician has recommended you make the following change in your medication: BEGIN TAKING IMDUR 30 MG ONCE DAILY  BEGIN TAKING LIPITOR 40 MG ONCE DAILY Please continue all other medications as prescribed  Labwork: AST, ALT IN 6 WEEKS  Testing/Procedures: NONE  Follow-Up: Your physician wants you to follow-up in: [place]. [place]. You will receive a reminder letter in the mail two months in advance. If you don't receive a letter, please call our office to schedule the follow-up appointment.  Any Other Special Instructions Will Be Listed Below (If Applicable).  If you need a refill on your cardiac medications before your next appointment, please call your pharmacy.

## 2017-04-11 NOTE — Progress Notes (Signed)
SUBJECTIVE: The patient returns for follow-up after undergoing cardiovascular testing performed for the evaluation of chest pain.  Nuclear stress test 11/15/16 demonstrated a large degree of myocardial scar in the inferior, inferoapical, and inferolateral walls with no evidence of ischemia, LVEF 41%.  She has a history of end-stage renal disease on dialysis, thyroid cancer, insulin-dependent diabetes mellitus, tobacco abuse, peripheral vascular disease, coronary artery disease with stents. She has bilateral BKA and right eye vision loss due to diabetic vitreous hemorrhage.  She previously told me she had an MI in February 1998 in [place], [place]. She said she has 4 stents and all of them were placed at [facility]. Previous cardiologist at that time was Dr. Algernon Huxley.  She is not sure whether or not she wants to undergo surgery for retroperitoneal dialysis. She has some pressure ulcers which still need to heal. She also likes the social aspect of attending hemodialysis.  She has anginal pain radiating into her left arm, jaw, and back occurring about once every 7-10 days.  She had been on Lipitor 80 mg in the past but this was stopped by her PCP. Liver transaminases were normal in 2015. She tells me she was told she has nonalcoholic steatohepatitis but "not to worry about it".    Review of Systems: As per "subjective", otherwise negative.  Allergies  Allergen Reactions  . Metformin And Related Diarrhea and Nausea And Vomiting  . Chantix [Varenicline]     "EVIL DREAMS"  . Ibuprofen Other (See Comments)    Cannot take because of low kidney function    Current Outpatient Prescriptions  Medication Sig Dispense Refill  . albuterol (PROAIR HFA) 108 (90 BASE) MCG/ACT inhaler Inhale 2 puffs into the lungs 3 (three) times daily as needed for wheezing or shortness of breath (allergies).    Marland Kitchen amLODipine (NORVASC) 5 MG tablet Take 5 mg by mouth daily.    Marland Kitchen aspirin EC 81 MG  tablet Take 1 tablet (81 mg total) by mouth daily.    . clopidogrel (PLAVIX) 75 MG tablet Take 75 mg by mouth at bedtime.     . folic acid-vitamin b complex-vitamin c-selenium-zinc (DIALYVITE) 3 MG TABS tablet Take 1 tablet by mouth daily.    . furosemide (LASIX) 80 MG tablet Take 1 tablet (80 mg total) by mouth 2 (two) times daily. 60 tablet 0  . hydrALAZINE (APRESOLINE) 25 MG tablet Take 75 mg by mouth 2 (two) times daily.     . insulin glargine (LANTUS) 100 UNIT/ML injection Inject 52 Units into the skin at bedtime.     . insulin lispro (HUMALOG) 100 UNIT/ML injection Inject 12 Units into the skin 3 (three) times daily before meals.     Marland Kitchen levothyroxine (SYNTHROID, LEVOTHROID) 150 MCG tablet Take 150 mcg by mouth daily before breakfast.    . lidocaine-prilocaine (EMLA) cream Apply 1 application topically as needed (for pain/ apply prior to dialysis).    . metoprolol (TOPROL-XL) 200 MG 24 hr tablet Take 200 mg by mouth 2 (two) times daily.    . Omega-3 Fatty Acids (FISH OIL) 1000 MG CAPS Take 1,000 mg by mouth 3 (three) times daily.     . Oxycodone HCl 10 MG TABS Take 10 mg by mouth every 6 (six) hours as needed.    . promethazine (PHENERGAN) 25 MG tablet Take 25 mg by mouth every 6 (six) hours as needed for nausea or vomiting.    . sevelamer carbonate (RENVELA) 800 MG  tablet Take 800-1,600 mg by mouth See admin instructions. Take 1 tablet  (800 mg) with snacks and 2 tablets (1600 mg) with meals    . vancomycin (VANCOCIN) 1 GM/200ML SOLN Inject 200 mLs (1,000 mg total) into the vein every Monday, Wednesday, and Friday with hemodialysis. 4000 mL    No current facility-administered medications for this visit.     Past Medical History:  Diagnosis Date  . Chronic kidney disease    End stage renal disease, will be starting dialysis on 10/08/13  . Complication of anesthesia   . Coronary artery disease   . Diabetes mellitus without complication (HCC)    Type 1  . Environmental allergies    uses  inhalers  . Headache(784.0)   . Hypertension   . Multiple sclerosis ([place])    just diagnosed 2014  . Myocardial infarction ([place]) 1998  . Nonalcoholic steatohepatitis ([facility])   . Peripheral vascular disease ([place])    aortic artery occlusion  . PONV (postoperative nausea and vomiting)   . Umbilical hernia   . Umbilical hernia     Past Surgical History:  Procedure Laterality Date  . ABDOMINAL SURGERY  2006   aortic artery occluded, had to "clean it out"  . AMPUTATION Left 09/22/2014   Procedure: AMPUTATION LEFT FOURTH FINGER;  Surgeon: Dayna Barker, MD;  Location: [place];  Service: Plastics;  Laterality: Left;  . AV FISTULA PLACEMENT Left 09/24/2013   Procedure: LEFT UPPER EXTREMITY ARTERIOVENOUS (AV) FISTULA CREATION BRACHIAL/CEPHALIC;  Surgeon: Elam Dutch, MD;  Location: [place];  Service: Vascular;  Laterality: Left;  . BASCILIC VEIN TRANSPOSITION Left 02/18/2014   Procedure: BASILIC VEIN TRANSPOSITION - 2ND STAGE LEFT ARM;  Surgeon: Elam Dutch, MD;  Location: [place];  Service: Vascular;  Laterality: Left;  . BLE amputation BK    . CARDIAC CATHETERIZATION    . cardiac stents    . CORONARY ANGIOPLASTY    . INSERTION OF DIALYSIS CATHETER Right 10/07/2013   Procedure: INSERTION OF DIALYSIS CATHETER;  Surgeon: Conrad [place], MD;  Location: [place];  Service: Vascular;  Laterality: Right;  . IRRIGATION AND DEBRIDEMENT ABSCESS    . SHUNTOGRAM Left 04/17/2014   Procedure: FISTULOGRAM;  Surgeon: Conrad Hall, MD;  Location: [facility] CATH LAB;  Service: Cardiovascular;  Laterality: Left;    Social History   Social History  . Marital status: Married    Spouse name: N/A  . Number of children: N/A  . Years of education: N/A   Occupational History  . Not on file.   Social History Main Topics  . Smoking status: Current Every Day Smoker    Packs/day: 1.00    Types: Cigarettes  . Smokeless tobacco: Never Used  . Alcohol use No  . Drug use: No  . Sexual activity: Not on file   Other  Topics Concern  . Not on file   Social History Narrative  . No narrative on file     Vitals:   04/11/17 1130  BP: 120/68  Pulse: 64  SpO2: 98%  Weight: 188 lb (85.3 kg)  Height: 4\' 3"  (1.295 m)    Wt Readings from Last 3 Encounters:  04/11/17 188 lb (85.3 kg)  11/08/16 195 lb (88.5 kg)  09/23/14 211 lb 6.7 oz (95.9 kg)     PHYSICAL EXAM (unchanged) General: NAD Neck: No JVD, no thyromegaly or thyroid nodule.  Lungs: Clear to auscultation bilaterally with normal respiratory effort. CV: Nondisplaced PMI. Regular rate and rhythm, normal S1/S2,  no S2/A7, 3/6 systolic murmur over RUSB/LUSB.  No peripheral edema. Bilateral BKA. Abdomen: Soft, nontender, no distention.  Skin: Intact without lesions or rashes.  Neurologic: Alert and oriented x 3.  Psych: Normal affect. Extremities: Bilateral BKA. HEENT: Normal.     ECG: Most recent ECG reviewed.   Labs: Lab Results  Component Value Date/Time   K 3.9 09/23/2014 05:06 AM   BUN 27 (H) 09/23/2014 05:06 AM   CREATININE 2.32 (H) 09/23/2014 05:06 AM   ALT 14 09/22/2014 01:25 AM   TSH 4.544 (H) 07/30/2013 05:07 AM   HGB 11.4 (L) 09/22/2014 01:00 PM     Lipids: No results found for: LDLCALC, LDLDIRECT, CHOL, TRIG, HDL     ASSESSMENT AND PLAN: 1. CAD with anginal chest pain: Symptoms occur about once every 7-10 days. No ischemia with stress testing as detailed above with a large degree of myocardial scar. I will start Imdur 30 mg daily. I will also start Lipitor 40 mg as she requires high intensity statin therapy. I will check AST/ALT in 6 weeks. Continue aspirin, Toprol-XL, and Plavix.  2. Preoperative risk stratification: Given her multiple comorbidities she is a high risk surgical candidate but not prohibitive. There was no ischemia seen with stress testing but a large degree of myocardial scar as detailed above. While I would hold Plavix before the planned surgery, I would not hold aspirin given her multiple stents.  Continue beta blocker. I will start statin therapy for secondary prevention.  3. Hypertension: Controlled. No changes.  4. Hyperlipidemia: I will start Lipitor 40 mg as she requires high intensity statin therapy.I will check AST/ALT in 6 weeks.     Disposition: Follow up 4 months.  Kate Sable, M.D., F.A.C.C.

## 2017-04-12 DIAGNOSIS — N2581 Secondary hyperparathyroidism of renal origin: Secondary | ICD-10-CM | POA: Diagnosis not present

## 2017-04-12 DIAGNOSIS — N186 End stage renal disease: Secondary | ICD-10-CM | POA: Diagnosis not present

## 2017-04-12 DIAGNOSIS — D509 Iron deficiency anemia, unspecified: Secondary | ICD-10-CM | POA: Diagnosis not present

## 2017-04-12 DIAGNOSIS — Z992 Dependence on renal dialysis: Secondary | ICD-10-CM | POA: Diagnosis not present

## 2017-04-13 DIAGNOSIS — M545 Low back pain: Secondary | ICD-10-CM | POA: Diagnosis not present

## 2017-04-13 DIAGNOSIS — M169 Osteoarthritis of hip, unspecified: Secondary | ICD-10-CM | POA: Diagnosis not present

## 2017-04-13 DIAGNOSIS — M25559 Pain in unspecified hip: Secondary | ICD-10-CM | POA: Diagnosis not present

## 2017-04-13 DIAGNOSIS — G894 Chronic pain syndrome: Secondary | ICD-10-CM | POA: Diagnosis not present

## 2017-04-14 DIAGNOSIS — D509 Iron deficiency anemia, unspecified: Secondary | ICD-10-CM | POA: Diagnosis not present

## 2017-04-14 DIAGNOSIS — N2581 Secondary hyperparathyroidism of renal origin: Secondary | ICD-10-CM | POA: Diagnosis not present

## 2017-04-14 DIAGNOSIS — Z992 Dependence on renal dialysis: Secondary | ICD-10-CM | POA: Diagnosis not present

## 2017-04-14 DIAGNOSIS — N186 End stage renal disease: Secondary | ICD-10-CM | POA: Diagnosis not present

## 2017-04-15 DIAGNOSIS — N186 End stage renal disease: Secondary | ICD-10-CM | POA: Diagnosis not present

## 2017-04-15 DIAGNOSIS — Z992 Dependence on renal dialysis: Secondary | ICD-10-CM | POA: Diagnosis not present

## 2017-04-17 DIAGNOSIS — Z992 Dependence on renal dialysis: Secondary | ICD-10-CM | POA: Diagnosis not present

## 2017-04-17 DIAGNOSIS — N186 End stage renal disease: Secondary | ICD-10-CM | POA: Diagnosis not present

## 2017-04-17 DIAGNOSIS — D509 Iron deficiency anemia, unspecified: Secondary | ICD-10-CM | POA: Diagnosis not present

## 2017-04-19 DIAGNOSIS — D509 Iron deficiency anemia, unspecified: Secondary | ICD-10-CM | POA: Diagnosis not present

## 2017-04-19 DIAGNOSIS — N186 End stage renal disease: Secondary | ICD-10-CM | POA: Diagnosis not present

## 2017-04-19 DIAGNOSIS — Z992 Dependence on renal dialysis: Secondary | ICD-10-CM | POA: Diagnosis not present

## 2017-04-21 DIAGNOSIS — Z992 Dependence on renal dialysis: Secondary | ICD-10-CM | POA: Diagnosis not present

## 2017-04-21 DIAGNOSIS — D509 Iron deficiency anemia, unspecified: Secondary | ICD-10-CM | POA: Diagnosis not present

## 2017-04-21 DIAGNOSIS — N186 End stage renal disease: Secondary | ICD-10-CM | POA: Diagnosis not present

## 2017-04-24 DIAGNOSIS — N186 End stage renal disease: Secondary | ICD-10-CM | POA: Diagnosis not present

## 2017-04-24 DIAGNOSIS — Z992 Dependence on renal dialysis: Secondary | ICD-10-CM | POA: Diagnosis not present

## 2017-04-24 DIAGNOSIS — D509 Iron deficiency anemia, unspecified: Secondary | ICD-10-CM | POA: Diagnosis not present

## 2017-04-25 DIAGNOSIS — Z79891 Long term (current) use of opiate analgesic: Secondary | ICD-10-CM | POA: Diagnosis not present

## 2017-04-25 DIAGNOSIS — G894 Chronic pain syndrome: Secondary | ICD-10-CM | POA: Diagnosis not present

## 2017-04-26 DIAGNOSIS — Z992 Dependence on renal dialysis: Secondary | ICD-10-CM | POA: Diagnosis not present

## 2017-04-26 DIAGNOSIS — D509 Iron deficiency anemia, unspecified: Secondary | ICD-10-CM | POA: Diagnosis not present

## 2017-04-26 DIAGNOSIS — N186 End stage renal disease: Secondary | ICD-10-CM | POA: Diagnosis not present

## 2017-04-28 DIAGNOSIS — Z992 Dependence on renal dialysis: Secondary | ICD-10-CM | POA: Diagnosis not present

## 2017-04-28 DIAGNOSIS — D509 Iron deficiency anemia, unspecified: Secondary | ICD-10-CM | POA: Diagnosis not present

## 2017-04-28 DIAGNOSIS — N186 End stage renal disease: Secondary | ICD-10-CM | POA: Diagnosis not present

## 2017-05-01 DIAGNOSIS — N186 End stage renal disease: Secondary | ICD-10-CM | POA: Diagnosis not present

## 2017-05-01 DIAGNOSIS — D509 Iron deficiency anemia, unspecified: Secondary | ICD-10-CM | POA: Diagnosis not present

## 2017-05-01 DIAGNOSIS — Z992 Dependence on renal dialysis: Secondary | ICD-10-CM | POA: Diagnosis not present

## 2017-05-03 DIAGNOSIS — D509 Iron deficiency anemia, unspecified: Secondary | ICD-10-CM | POA: Diagnosis not present

## 2017-05-03 DIAGNOSIS — Z992 Dependence on renal dialysis: Secondary | ICD-10-CM | POA: Diagnosis not present

## 2017-05-03 DIAGNOSIS — N186 End stage renal disease: Secondary | ICD-10-CM | POA: Diagnosis not present

## 2017-05-05 DIAGNOSIS — D509 Iron deficiency anemia, unspecified: Secondary | ICD-10-CM | POA: Diagnosis not present

## 2017-05-05 DIAGNOSIS — Z992 Dependence on renal dialysis: Secondary | ICD-10-CM | POA: Diagnosis not present

## 2017-05-05 DIAGNOSIS — N186 End stage renal disease: Secondary | ICD-10-CM | POA: Diagnosis not present

## 2017-05-08 DIAGNOSIS — D509 Iron deficiency anemia, unspecified: Secondary | ICD-10-CM | POA: Diagnosis not present

## 2017-05-08 DIAGNOSIS — N186 End stage renal disease: Secondary | ICD-10-CM | POA: Diagnosis not present

## 2017-05-08 DIAGNOSIS — Z992 Dependence on renal dialysis: Secondary | ICD-10-CM | POA: Diagnosis not present

## 2017-05-10 DIAGNOSIS — D509 Iron deficiency anemia, unspecified: Secondary | ICD-10-CM | POA: Diagnosis not present

## 2017-05-10 DIAGNOSIS — N186 End stage renal disease: Secondary | ICD-10-CM | POA: Diagnosis not present

## 2017-05-10 DIAGNOSIS — Z992 Dependence on renal dialysis: Secondary | ICD-10-CM | POA: Diagnosis not present

## 2017-05-11 DIAGNOSIS — M25559 Pain in unspecified hip: Secondary | ICD-10-CM | POA: Diagnosis not present

## 2017-05-11 DIAGNOSIS — G894 Chronic pain syndrome: Secondary | ICD-10-CM | POA: Diagnosis not present

## 2017-05-11 DIAGNOSIS — M169 Osteoarthritis of hip, unspecified: Secondary | ICD-10-CM | POA: Diagnosis not present

## 2017-05-11 DIAGNOSIS — M545 Low back pain: Secondary | ICD-10-CM | POA: Diagnosis not present

## 2017-05-12 DIAGNOSIS — D509 Iron deficiency anemia, unspecified: Secondary | ICD-10-CM | POA: Diagnosis not present

## 2017-05-12 DIAGNOSIS — Z992 Dependence on renal dialysis: Secondary | ICD-10-CM | POA: Diagnosis not present

## 2017-05-12 DIAGNOSIS — N186 End stage renal disease: Secondary | ICD-10-CM | POA: Diagnosis not present

## 2017-05-15 DIAGNOSIS — N186 End stage renal disease: Secondary | ICD-10-CM | POA: Diagnosis not present

## 2017-05-15 DIAGNOSIS — Z992 Dependence on renal dialysis: Secondary | ICD-10-CM | POA: Diagnosis not present

## 2017-05-15 DIAGNOSIS — D509 Iron deficiency anemia, unspecified: Secondary | ICD-10-CM | POA: Diagnosis not present

## 2017-05-16 DIAGNOSIS — L2089 Other atopic dermatitis: Secondary | ICD-10-CM | POA: Diagnosis not present

## 2017-05-16 DIAGNOSIS — E038 Other specified hypothyroidism: Secondary | ICD-10-CM | POA: Diagnosis not present

## 2017-05-16 DIAGNOSIS — N186 End stage renal disease: Secondary | ICD-10-CM | POA: Diagnosis not present

## 2017-05-16 DIAGNOSIS — E1142 Type 2 diabetes mellitus with diabetic polyneuropathy: Secondary | ICD-10-CM | POA: Diagnosis not present

## 2017-05-16 DIAGNOSIS — S88112S Complete traumatic amputation at level between knee and ankle, left lower leg, sequela: Secondary | ICD-10-CM | POA: Diagnosis not present

## 2017-05-16 DIAGNOSIS — Z992 Dependence on renal dialysis: Secondary | ICD-10-CM | POA: Diagnosis not present

## 2017-05-16 DIAGNOSIS — I10 Essential (primary) hypertension: Secondary | ICD-10-CM | POA: Diagnosis not present

## 2017-05-17 DIAGNOSIS — Z992 Dependence on renal dialysis: Secondary | ICD-10-CM | POA: Diagnosis not present

## 2017-05-17 DIAGNOSIS — D509 Iron deficiency anemia, unspecified: Secondary | ICD-10-CM | POA: Diagnosis not present

## 2017-05-17 DIAGNOSIS — N186 End stage renal disease: Secondary | ICD-10-CM | POA: Diagnosis not present

## 2017-05-20 DIAGNOSIS — Z992 Dependence on renal dialysis: Secondary | ICD-10-CM | POA: Diagnosis not present

## 2017-05-20 DIAGNOSIS — D509 Iron deficiency anemia, unspecified: Secondary | ICD-10-CM | POA: Diagnosis not present

## 2017-05-20 DIAGNOSIS — N186 End stage renal disease: Secondary | ICD-10-CM | POA: Diagnosis not present

## 2017-05-22 DIAGNOSIS — N186 End stage renal disease: Secondary | ICD-10-CM | POA: Diagnosis not present

## 2017-05-22 DIAGNOSIS — Z992 Dependence on renal dialysis: Secondary | ICD-10-CM | POA: Diagnosis not present

## 2017-05-22 DIAGNOSIS — D509 Iron deficiency anemia, unspecified: Secondary | ICD-10-CM | POA: Diagnosis not present

## 2017-05-24 DIAGNOSIS — Z992 Dependence on renal dialysis: Secondary | ICD-10-CM | POA: Diagnosis not present

## 2017-05-24 DIAGNOSIS — N186 End stage renal disease: Secondary | ICD-10-CM | POA: Diagnosis not present

## 2017-05-24 DIAGNOSIS — D509 Iron deficiency anemia, unspecified: Secondary | ICD-10-CM | POA: Diagnosis not present

## 2017-05-26 DIAGNOSIS — N186 End stage renal disease: Secondary | ICD-10-CM | POA: Diagnosis not present

## 2017-05-26 DIAGNOSIS — D509 Iron deficiency anemia, unspecified: Secondary | ICD-10-CM | POA: Diagnosis not present

## 2017-05-26 DIAGNOSIS — Z992 Dependence on renal dialysis: Secondary | ICD-10-CM | POA: Diagnosis not present

## 2017-05-29 DIAGNOSIS — Z992 Dependence on renal dialysis: Secondary | ICD-10-CM | POA: Diagnosis not present

## 2017-05-29 DIAGNOSIS — D509 Iron deficiency anemia, unspecified: Secondary | ICD-10-CM | POA: Diagnosis not present

## 2017-05-29 DIAGNOSIS — N186 End stage renal disease: Secondary | ICD-10-CM | POA: Diagnosis not present

## 2017-05-31 DIAGNOSIS — Z992 Dependence on renal dialysis: Secondary | ICD-10-CM | POA: Diagnosis not present

## 2017-05-31 DIAGNOSIS — D509 Iron deficiency anemia, unspecified: Secondary | ICD-10-CM | POA: Diagnosis not present

## 2017-05-31 DIAGNOSIS — N186 End stage renal disease: Secondary | ICD-10-CM | POA: Diagnosis not present

## 2017-06-02 DIAGNOSIS — Z992 Dependence on renal dialysis: Secondary | ICD-10-CM | POA: Diagnosis not present

## 2017-06-02 DIAGNOSIS — N186 End stage renal disease: Secondary | ICD-10-CM | POA: Diagnosis not present

## 2017-06-02 DIAGNOSIS — D509 Iron deficiency anemia, unspecified: Secondary | ICD-10-CM | POA: Diagnosis not present

## 2017-06-05 DIAGNOSIS — N186 End stage renal disease: Secondary | ICD-10-CM | POA: Diagnosis not present

## 2017-06-05 DIAGNOSIS — Z992 Dependence on renal dialysis: Secondary | ICD-10-CM | POA: Diagnosis not present

## 2017-06-05 DIAGNOSIS — D509 Iron deficiency anemia, unspecified: Secondary | ICD-10-CM | POA: Diagnosis not present

## 2017-06-07 DIAGNOSIS — Z992 Dependence on renal dialysis: Secondary | ICD-10-CM | POA: Diagnosis not present

## 2017-06-07 DIAGNOSIS — N186 End stage renal disease: Secondary | ICD-10-CM | POA: Diagnosis not present

## 2017-06-07 DIAGNOSIS — D509 Iron deficiency anemia, unspecified: Secondary | ICD-10-CM | POA: Diagnosis not present

## 2017-06-09 ENCOUNTER — Encounter: Payer: Self-pay | Admitting: *Deleted

## 2017-06-09 DIAGNOSIS — N186 End stage renal disease: Secondary | ICD-10-CM | POA: Diagnosis not present

## 2017-06-09 DIAGNOSIS — Z992 Dependence on renal dialysis: Secondary | ICD-10-CM | POA: Diagnosis not present

## 2017-06-09 DIAGNOSIS — D509 Iron deficiency anemia, unspecified: Secondary | ICD-10-CM | POA: Diagnosis not present

## 2017-06-12 DIAGNOSIS — N186 End stage renal disease: Secondary | ICD-10-CM | POA: Diagnosis not present

## 2017-06-12 DIAGNOSIS — D509 Iron deficiency anemia, unspecified: Secondary | ICD-10-CM | POA: Diagnosis not present

## 2017-06-12 DIAGNOSIS — Z992 Dependence on renal dialysis: Secondary | ICD-10-CM | POA: Diagnosis not present

## 2017-06-13 DIAGNOSIS — G894 Chronic pain syndrome: Secondary | ICD-10-CM | POA: Diagnosis not present

## 2017-06-13 DIAGNOSIS — M545 Low back pain: Secondary | ICD-10-CM | POA: Diagnosis not present

## 2017-06-13 DIAGNOSIS — M169 Osteoarthritis of hip, unspecified: Secondary | ICD-10-CM | POA: Diagnosis not present

## 2017-06-13 DIAGNOSIS — M25559 Pain in unspecified hip: Secondary | ICD-10-CM | POA: Diagnosis not present

## 2017-06-14 DIAGNOSIS — N186 End stage renal disease: Secondary | ICD-10-CM | POA: Diagnosis not present

## 2017-06-14 DIAGNOSIS — Z992 Dependence on renal dialysis: Secondary | ICD-10-CM | POA: Diagnosis not present

## 2017-06-14 DIAGNOSIS — D509 Iron deficiency anemia, unspecified: Secondary | ICD-10-CM | POA: Diagnosis not present

## 2017-06-15 DIAGNOSIS — N186 End stage renal disease: Secondary | ICD-10-CM | POA: Diagnosis not present

## 2017-06-15 DIAGNOSIS — Z992 Dependence on renal dialysis: Secondary | ICD-10-CM | POA: Diagnosis not present

## 2017-06-16 DIAGNOSIS — N186 End stage renal disease: Secondary | ICD-10-CM | POA: Diagnosis not present

## 2017-06-16 DIAGNOSIS — D509 Iron deficiency anemia, unspecified: Secondary | ICD-10-CM | POA: Diagnosis not present

## 2017-06-16 DIAGNOSIS — Z992 Dependence on renal dialysis: Secondary | ICD-10-CM | POA: Diagnosis not present

## 2017-06-19 DIAGNOSIS — N2581 Secondary hyperparathyroidism of renal origin: Secondary | ICD-10-CM | POA: Diagnosis not present

## 2017-06-19 DIAGNOSIS — D509 Iron deficiency anemia, unspecified: Secondary | ICD-10-CM | POA: Diagnosis not present

## 2017-06-19 DIAGNOSIS — Z992 Dependence on renal dialysis: Secondary | ICD-10-CM | POA: Diagnosis not present

## 2017-06-19 DIAGNOSIS — N186 End stage renal disease: Secondary | ICD-10-CM | POA: Diagnosis not present

## 2017-06-21 DIAGNOSIS — N2581 Secondary hyperparathyroidism of renal origin: Secondary | ICD-10-CM | POA: Diagnosis not present

## 2017-06-21 DIAGNOSIS — N186 End stage renal disease: Secondary | ICD-10-CM | POA: Diagnosis not present

## 2017-06-21 DIAGNOSIS — Z992 Dependence on renal dialysis: Secondary | ICD-10-CM | POA: Diagnosis not present

## 2017-06-21 DIAGNOSIS — D509 Iron deficiency anemia, unspecified: Secondary | ICD-10-CM | POA: Diagnosis not present

## 2017-06-23 DIAGNOSIS — N186 End stage renal disease: Secondary | ICD-10-CM | POA: Diagnosis not present

## 2017-06-23 DIAGNOSIS — Z992 Dependence on renal dialysis: Secondary | ICD-10-CM | POA: Diagnosis not present

## 2017-06-23 DIAGNOSIS — N2581 Secondary hyperparathyroidism of renal origin: Secondary | ICD-10-CM | POA: Diagnosis not present

## 2017-06-23 DIAGNOSIS — D509 Iron deficiency anemia, unspecified: Secondary | ICD-10-CM | POA: Diagnosis not present

## 2017-06-26 DIAGNOSIS — N2581 Secondary hyperparathyroidism of renal origin: Secondary | ICD-10-CM | POA: Diagnosis not present

## 2017-06-26 DIAGNOSIS — Z992 Dependence on renal dialysis: Secondary | ICD-10-CM | POA: Diagnosis not present

## 2017-06-26 DIAGNOSIS — N186 End stage renal disease: Secondary | ICD-10-CM | POA: Diagnosis not present

## 2017-06-26 DIAGNOSIS — D509 Iron deficiency anemia, unspecified: Secondary | ICD-10-CM | POA: Diagnosis not present

## 2017-06-28 DIAGNOSIS — N2581 Secondary hyperparathyroidism of renal origin: Secondary | ICD-10-CM | POA: Diagnosis not present

## 2017-06-28 DIAGNOSIS — D509 Iron deficiency anemia, unspecified: Secondary | ICD-10-CM | POA: Diagnosis not present

## 2017-06-28 DIAGNOSIS — N186 End stage renal disease: Secondary | ICD-10-CM | POA: Diagnosis not present

## 2017-06-28 DIAGNOSIS — Z992 Dependence on renal dialysis: Secondary | ICD-10-CM | POA: Diagnosis not present

## 2017-06-29 DIAGNOSIS — Z992 Dependence on renal dialysis: Secondary | ICD-10-CM | POA: Diagnosis not present

## 2017-06-29 DIAGNOSIS — N186 End stage renal disease: Secondary | ICD-10-CM | POA: Diagnosis not present

## 2017-06-29 DIAGNOSIS — N2581 Secondary hyperparathyroidism of renal origin: Secondary | ICD-10-CM | POA: Diagnosis not present

## 2017-06-29 DIAGNOSIS — D509 Iron deficiency anemia, unspecified: Secondary | ICD-10-CM | POA: Diagnosis not present

## 2017-07-03 DIAGNOSIS — N186 End stage renal disease: Secondary | ICD-10-CM | POA: Diagnosis not present

## 2017-07-03 DIAGNOSIS — N2581 Secondary hyperparathyroidism of renal origin: Secondary | ICD-10-CM | POA: Diagnosis not present

## 2017-07-03 DIAGNOSIS — D509 Iron deficiency anemia, unspecified: Secondary | ICD-10-CM | POA: Diagnosis not present

## 2017-07-03 DIAGNOSIS — Z992 Dependence on renal dialysis: Secondary | ICD-10-CM | POA: Diagnosis not present

## 2017-07-05 DIAGNOSIS — N186 End stage renal disease: Secondary | ICD-10-CM | POA: Diagnosis not present

## 2017-07-05 DIAGNOSIS — Z992 Dependence on renal dialysis: Secondary | ICD-10-CM | POA: Diagnosis not present

## 2017-07-05 DIAGNOSIS — N2581 Secondary hyperparathyroidism of renal origin: Secondary | ICD-10-CM | POA: Diagnosis not present

## 2017-07-05 DIAGNOSIS — D509 Iron deficiency anemia, unspecified: Secondary | ICD-10-CM | POA: Diagnosis not present

## 2017-07-07 DIAGNOSIS — Z992 Dependence on renal dialysis: Secondary | ICD-10-CM | POA: Diagnosis not present

## 2017-07-07 DIAGNOSIS — D509 Iron deficiency anemia, unspecified: Secondary | ICD-10-CM | POA: Diagnosis not present

## 2017-07-07 DIAGNOSIS — N2581 Secondary hyperparathyroidism of renal origin: Secondary | ICD-10-CM | POA: Diagnosis not present

## 2017-07-07 DIAGNOSIS — N186 End stage renal disease: Secondary | ICD-10-CM | POA: Diagnosis not present

## 2017-07-10 DIAGNOSIS — Z794 Long term (current) use of insulin: Secondary | ICD-10-CM | POA: Diagnosis not present

## 2017-07-10 DIAGNOSIS — N2581 Secondary hyperparathyroidism of renal origin: Secondary | ICD-10-CM | POA: Diagnosis not present

## 2017-07-10 DIAGNOSIS — N186 End stage renal disease: Secondary | ICD-10-CM | POA: Diagnosis not present

## 2017-07-10 DIAGNOSIS — E109 Type 1 diabetes mellitus without complications: Secondary | ICD-10-CM | POA: Diagnosis not present

## 2017-07-10 DIAGNOSIS — Z992 Dependence on renal dialysis: Secondary | ICD-10-CM | POA: Diagnosis not present

## 2017-07-10 DIAGNOSIS — D509 Iron deficiency anemia, unspecified: Secondary | ICD-10-CM | POA: Diagnosis not present

## 2017-07-11 DIAGNOSIS — M25559 Pain in unspecified hip: Secondary | ICD-10-CM | POA: Diagnosis not present

## 2017-07-11 DIAGNOSIS — M169 Osteoarthritis of hip, unspecified: Secondary | ICD-10-CM | POA: Diagnosis not present

## 2017-07-11 DIAGNOSIS — G894 Chronic pain syndrome: Secondary | ICD-10-CM | POA: Diagnosis not present

## 2017-07-11 DIAGNOSIS — M47816 Spondylosis without myelopathy or radiculopathy, lumbar region: Secondary | ICD-10-CM | POA: Diagnosis not present

## 2017-07-13 DIAGNOSIS — D509 Iron deficiency anemia, unspecified: Secondary | ICD-10-CM | POA: Diagnosis not present

## 2017-07-13 DIAGNOSIS — N186 End stage renal disease: Secondary | ICD-10-CM | POA: Diagnosis not present

## 2017-07-13 DIAGNOSIS — N2581 Secondary hyperparathyroidism of renal origin: Secondary | ICD-10-CM | POA: Diagnosis not present

## 2017-07-13 DIAGNOSIS — Z992 Dependence on renal dialysis: Secondary | ICD-10-CM | POA: Diagnosis not present

## 2017-07-14 DIAGNOSIS — N186 End stage renal disease: Secondary | ICD-10-CM | POA: Diagnosis not present

## 2017-07-14 DIAGNOSIS — Z992 Dependence on renal dialysis: Secondary | ICD-10-CM | POA: Diagnosis not present

## 2017-07-14 DIAGNOSIS — N2581 Secondary hyperparathyroidism of renal origin: Secondary | ICD-10-CM | POA: Diagnosis not present

## 2017-07-14 DIAGNOSIS — D509 Iron deficiency anemia, unspecified: Secondary | ICD-10-CM | POA: Diagnosis not present

## 2017-07-16 DIAGNOSIS — N186 End stage renal disease: Secondary | ICD-10-CM | POA: Diagnosis not present

## 2017-07-16 DIAGNOSIS — Z992 Dependence on renal dialysis: Secondary | ICD-10-CM | POA: Diagnosis not present

## 2017-07-17 DIAGNOSIS — D509 Iron deficiency anemia, unspecified: Secondary | ICD-10-CM | POA: Diagnosis not present

## 2017-07-17 DIAGNOSIS — N186 End stage renal disease: Secondary | ICD-10-CM | POA: Diagnosis not present

## 2017-07-17 DIAGNOSIS — Z23 Encounter for immunization: Secondary | ICD-10-CM | POA: Diagnosis not present

## 2017-07-17 DIAGNOSIS — Z992 Dependence on renal dialysis: Secondary | ICD-10-CM | POA: Diagnosis not present

## 2017-07-18 DIAGNOSIS — H6241 Otitis externa in other diseases classified elsewhere, right ear: Secondary | ICD-10-CM | POA: Diagnosis not present

## 2017-07-19 DIAGNOSIS — Z23 Encounter for immunization: Secondary | ICD-10-CM | POA: Diagnosis not present

## 2017-07-19 DIAGNOSIS — D509 Iron deficiency anemia, unspecified: Secondary | ICD-10-CM | POA: Diagnosis not present

## 2017-07-19 DIAGNOSIS — N186 End stage renal disease: Secondary | ICD-10-CM | POA: Diagnosis not present

## 2017-07-19 DIAGNOSIS — Z992 Dependence on renal dialysis: Secondary | ICD-10-CM | POA: Diagnosis not present

## 2017-07-21 DIAGNOSIS — Z23 Encounter for immunization: Secondary | ICD-10-CM | POA: Diagnosis not present

## 2017-07-21 DIAGNOSIS — Z992 Dependence on renal dialysis: Secondary | ICD-10-CM | POA: Diagnosis not present

## 2017-07-21 DIAGNOSIS — N186 End stage renal disease: Secondary | ICD-10-CM | POA: Diagnosis not present

## 2017-07-21 DIAGNOSIS — D509 Iron deficiency anemia, unspecified: Secondary | ICD-10-CM | POA: Diagnosis not present

## 2017-07-24 DIAGNOSIS — Z23 Encounter for immunization: Secondary | ICD-10-CM | POA: Diagnosis not present

## 2017-07-24 DIAGNOSIS — N186 End stage renal disease: Secondary | ICD-10-CM | POA: Diagnosis not present

## 2017-07-24 DIAGNOSIS — Z992 Dependence on renal dialysis: Secondary | ICD-10-CM | POA: Diagnosis not present

## 2017-07-24 DIAGNOSIS — D509 Iron deficiency anemia, unspecified: Secondary | ICD-10-CM | POA: Diagnosis not present

## 2017-07-26 DIAGNOSIS — Z992 Dependence on renal dialysis: Secondary | ICD-10-CM | POA: Diagnosis not present

## 2017-07-26 DIAGNOSIS — N186 End stage renal disease: Secondary | ICD-10-CM | POA: Diagnosis not present

## 2017-07-26 DIAGNOSIS — Z23 Encounter for immunization: Secondary | ICD-10-CM | POA: Diagnosis not present

## 2017-07-26 DIAGNOSIS — D509 Iron deficiency anemia, unspecified: Secondary | ICD-10-CM | POA: Diagnosis not present

## 2017-07-28 DIAGNOSIS — N186 End stage renal disease: Secondary | ICD-10-CM | POA: Diagnosis not present

## 2017-07-28 DIAGNOSIS — Z23 Encounter for immunization: Secondary | ICD-10-CM | POA: Diagnosis not present

## 2017-07-28 DIAGNOSIS — Z992 Dependence on renal dialysis: Secondary | ICD-10-CM | POA: Diagnosis not present

## 2017-07-28 DIAGNOSIS — D509 Iron deficiency anemia, unspecified: Secondary | ICD-10-CM | POA: Diagnosis not present

## 2017-08-01 DIAGNOSIS — N186 End stage renal disease: Secondary | ICD-10-CM | POA: Diagnosis not present

## 2017-08-01 DIAGNOSIS — D509 Iron deficiency anemia, unspecified: Secondary | ICD-10-CM | POA: Diagnosis not present

## 2017-08-01 DIAGNOSIS — Z23 Encounter for immunization: Secondary | ICD-10-CM | POA: Diagnosis not present

## 2017-08-01 DIAGNOSIS — Z992 Dependence on renal dialysis: Secondary | ICD-10-CM | POA: Diagnosis not present

## 2017-08-02 DIAGNOSIS — N186 End stage renal disease: Secondary | ICD-10-CM | POA: Diagnosis not present

## 2017-08-02 DIAGNOSIS — D509 Iron deficiency anemia, unspecified: Secondary | ICD-10-CM | POA: Diagnosis not present

## 2017-08-02 DIAGNOSIS — Z992 Dependence on renal dialysis: Secondary | ICD-10-CM | POA: Diagnosis not present

## 2017-08-02 DIAGNOSIS — Z23 Encounter for immunization: Secondary | ICD-10-CM | POA: Diagnosis not present

## 2017-08-04 DIAGNOSIS — N186 End stage renal disease: Secondary | ICD-10-CM | POA: Diagnosis not present

## 2017-08-04 DIAGNOSIS — Z23 Encounter for immunization: Secondary | ICD-10-CM | POA: Diagnosis not present

## 2017-08-04 DIAGNOSIS — Z992 Dependence on renal dialysis: Secondary | ICD-10-CM | POA: Diagnosis not present

## 2017-08-04 DIAGNOSIS — D509 Iron deficiency anemia, unspecified: Secondary | ICD-10-CM | POA: Diagnosis not present

## 2017-08-07 DIAGNOSIS — N186 End stage renal disease: Secondary | ICD-10-CM | POA: Diagnosis not present

## 2017-08-07 DIAGNOSIS — D509 Iron deficiency anemia, unspecified: Secondary | ICD-10-CM | POA: Diagnosis not present

## 2017-08-07 DIAGNOSIS — Z992 Dependence on renal dialysis: Secondary | ICD-10-CM | POA: Diagnosis not present

## 2017-08-07 DIAGNOSIS — Z23 Encounter for immunization: Secondary | ICD-10-CM | POA: Diagnosis not present

## 2017-08-08 DIAGNOSIS — G894 Chronic pain syndrome: Secondary | ICD-10-CM | POA: Diagnosis not present

## 2017-08-08 DIAGNOSIS — M545 Low back pain: Secondary | ICD-10-CM | POA: Diagnosis not present

## 2017-08-08 DIAGNOSIS — M47816 Spondylosis without myelopathy or radiculopathy, lumbar region: Secondary | ICD-10-CM | POA: Diagnosis not present

## 2017-08-08 DIAGNOSIS — M169 Osteoarthritis of hip, unspecified: Secondary | ICD-10-CM | POA: Diagnosis not present

## 2017-08-09 DIAGNOSIS — D509 Iron deficiency anemia, unspecified: Secondary | ICD-10-CM | POA: Diagnosis not present

## 2017-08-09 DIAGNOSIS — N186 End stage renal disease: Secondary | ICD-10-CM | POA: Diagnosis not present

## 2017-08-09 DIAGNOSIS — Z23 Encounter for immunization: Secondary | ICD-10-CM | POA: Diagnosis not present

## 2017-08-09 DIAGNOSIS — Z992 Dependence on renal dialysis: Secondary | ICD-10-CM | POA: Diagnosis not present

## 2017-08-10 DIAGNOSIS — I10 Essential (primary) hypertension: Secondary | ICD-10-CM | POA: Diagnosis not present

## 2017-08-10 DIAGNOSIS — J440 Chronic obstructive pulmonary disease with acute lower respiratory infection: Secondary | ICD-10-CM | POA: Diagnosis not present

## 2017-08-10 DIAGNOSIS — N186 End stage renal disease: Secondary | ICD-10-CM | POA: Diagnosis not present

## 2017-08-10 DIAGNOSIS — H673 Otitis media in diseases classified elsewhere, bilateral: Secondary | ICD-10-CM | POA: Diagnosis not present

## 2017-08-10 DIAGNOSIS — E119 Type 2 diabetes mellitus without complications: Secondary | ICD-10-CM | POA: Diagnosis not present

## 2017-08-11 DIAGNOSIS — N186 End stage renal disease: Secondary | ICD-10-CM | POA: Diagnosis not present

## 2017-08-11 DIAGNOSIS — Z992 Dependence on renal dialysis: Secondary | ICD-10-CM | POA: Diagnosis not present

## 2017-08-11 DIAGNOSIS — D509 Iron deficiency anemia, unspecified: Secondary | ICD-10-CM | POA: Diagnosis not present

## 2017-08-11 DIAGNOSIS — Z23 Encounter for immunization: Secondary | ICD-10-CM | POA: Diagnosis not present

## 2017-08-14 DIAGNOSIS — Z992 Dependence on renal dialysis: Secondary | ICD-10-CM | POA: Diagnosis not present

## 2017-08-14 DIAGNOSIS — N186 End stage renal disease: Secondary | ICD-10-CM | POA: Diagnosis not present

## 2017-08-14 DIAGNOSIS — Z23 Encounter for immunization: Secondary | ICD-10-CM | POA: Diagnosis not present

## 2017-08-14 DIAGNOSIS — D509 Iron deficiency anemia, unspecified: Secondary | ICD-10-CM | POA: Diagnosis not present

## 2017-08-15 DIAGNOSIS — Z992 Dependence on renal dialysis: Secondary | ICD-10-CM | POA: Diagnosis not present

## 2017-08-15 DIAGNOSIS — N186 End stage renal disease: Secondary | ICD-10-CM | POA: Diagnosis not present

## 2017-08-16 DIAGNOSIS — Z992 Dependence on renal dialysis: Secondary | ICD-10-CM | POA: Diagnosis not present

## 2017-08-16 DIAGNOSIS — N186 End stage renal disease: Secondary | ICD-10-CM | POA: Diagnosis not present

## 2017-08-16 DIAGNOSIS — Z23 Encounter for immunization: Secondary | ICD-10-CM | POA: Diagnosis not present

## 2017-08-16 DIAGNOSIS — D509 Iron deficiency anemia, unspecified: Secondary | ICD-10-CM | POA: Diagnosis not present

## 2017-08-18 DIAGNOSIS — D509 Iron deficiency anemia, unspecified: Secondary | ICD-10-CM | POA: Diagnosis not present

## 2017-08-18 DIAGNOSIS — N186 End stage renal disease: Secondary | ICD-10-CM | POA: Diagnosis not present

## 2017-08-18 DIAGNOSIS — Z992 Dependence on renal dialysis: Secondary | ICD-10-CM | POA: Diagnosis not present

## 2017-08-21 DIAGNOSIS — N186 End stage renal disease: Secondary | ICD-10-CM | POA: Diagnosis not present

## 2017-08-21 DIAGNOSIS — D509 Iron deficiency anemia, unspecified: Secondary | ICD-10-CM | POA: Diagnosis not present

## 2017-08-21 DIAGNOSIS — Z992 Dependence on renal dialysis: Secondary | ICD-10-CM | POA: Diagnosis not present

## 2017-08-23 DIAGNOSIS — D509 Iron deficiency anemia, unspecified: Secondary | ICD-10-CM | POA: Diagnosis not present

## 2017-08-23 DIAGNOSIS — Z992 Dependence on renal dialysis: Secondary | ICD-10-CM | POA: Diagnosis not present

## 2017-08-23 DIAGNOSIS — N186 End stage renal disease: Secondary | ICD-10-CM | POA: Diagnosis not present

## 2017-08-25 DIAGNOSIS — D509 Iron deficiency anemia, unspecified: Secondary | ICD-10-CM | POA: Diagnosis not present

## 2017-08-25 DIAGNOSIS — Z992 Dependence on renal dialysis: Secondary | ICD-10-CM | POA: Diagnosis not present

## 2017-08-25 DIAGNOSIS — N186 End stage renal disease: Secondary | ICD-10-CM | POA: Diagnosis not present

## 2017-08-28 DIAGNOSIS — D509 Iron deficiency anemia, unspecified: Secondary | ICD-10-CM | POA: Diagnosis not present

## 2017-08-28 DIAGNOSIS — N186 End stage renal disease: Secondary | ICD-10-CM | POA: Diagnosis not present

## 2017-08-28 DIAGNOSIS — Z992 Dependence on renal dialysis: Secondary | ICD-10-CM | POA: Diagnosis not present

## 2017-08-30 DIAGNOSIS — D509 Iron deficiency anemia, unspecified: Secondary | ICD-10-CM | POA: Diagnosis not present

## 2017-08-30 DIAGNOSIS — N186 End stage renal disease: Secondary | ICD-10-CM | POA: Diagnosis not present

## 2017-08-30 DIAGNOSIS — Z992 Dependence on renal dialysis: Secondary | ICD-10-CM | POA: Diagnosis not present

## 2017-09-01 DIAGNOSIS — D509 Iron deficiency anemia, unspecified: Secondary | ICD-10-CM | POA: Diagnosis not present

## 2017-09-01 DIAGNOSIS — N186 End stage renal disease: Secondary | ICD-10-CM | POA: Diagnosis not present

## 2017-09-01 DIAGNOSIS — Z992 Dependence on renal dialysis: Secondary | ICD-10-CM | POA: Diagnosis not present

## 2017-09-04 DIAGNOSIS — D509 Iron deficiency anemia, unspecified: Secondary | ICD-10-CM | POA: Diagnosis not present

## 2017-09-04 DIAGNOSIS — Z992 Dependence on renal dialysis: Secondary | ICD-10-CM | POA: Diagnosis not present

## 2017-09-04 DIAGNOSIS — N186 End stage renal disease: Secondary | ICD-10-CM | POA: Diagnosis not present

## 2017-09-05 DIAGNOSIS — Z992 Dependence on renal dialysis: Secondary | ICD-10-CM | POA: Diagnosis not present

## 2017-09-05 DIAGNOSIS — N186 End stage renal disease: Secondary | ICD-10-CM | POA: Diagnosis not present

## 2017-09-05 DIAGNOSIS — D509 Iron deficiency anemia, unspecified: Secondary | ICD-10-CM | POA: Diagnosis not present

## 2017-09-06 DIAGNOSIS — M169 Osteoarthritis of hip, unspecified: Secondary | ICD-10-CM | POA: Diagnosis not present

## 2017-09-06 DIAGNOSIS — G894 Chronic pain syndrome: Secondary | ICD-10-CM | POA: Diagnosis not present

## 2017-09-06 DIAGNOSIS — M47816 Spondylosis without myelopathy or radiculopathy, lumbar region: Secondary | ICD-10-CM | POA: Diagnosis not present

## 2017-09-08 DIAGNOSIS — Z992 Dependence on renal dialysis: Secondary | ICD-10-CM | POA: Diagnosis not present

## 2017-09-08 DIAGNOSIS — N186 End stage renal disease: Secondary | ICD-10-CM | POA: Diagnosis not present

## 2017-09-08 DIAGNOSIS — D509 Iron deficiency anemia, unspecified: Secondary | ICD-10-CM | POA: Diagnosis not present

## 2017-09-11 DIAGNOSIS — D509 Iron deficiency anemia, unspecified: Secondary | ICD-10-CM | POA: Diagnosis not present

## 2017-09-11 DIAGNOSIS — N186 End stage renal disease: Secondary | ICD-10-CM | POA: Diagnosis not present

## 2017-09-11 DIAGNOSIS — Z992 Dependence on renal dialysis: Secondary | ICD-10-CM | POA: Diagnosis not present

## 2017-09-13 DIAGNOSIS — D509 Iron deficiency anemia, unspecified: Secondary | ICD-10-CM | POA: Diagnosis not present

## 2017-09-13 DIAGNOSIS — Z992 Dependence on renal dialysis: Secondary | ICD-10-CM | POA: Diagnosis not present

## 2017-09-13 DIAGNOSIS — N186 End stage renal disease: Secondary | ICD-10-CM | POA: Diagnosis not present

## 2017-09-15 DIAGNOSIS — D509 Iron deficiency anemia, unspecified: Secondary | ICD-10-CM | POA: Diagnosis not present

## 2017-09-15 DIAGNOSIS — Z992 Dependence on renal dialysis: Secondary | ICD-10-CM | POA: Diagnosis not present

## 2017-09-15 DIAGNOSIS — N186 End stage renal disease: Secondary | ICD-10-CM | POA: Diagnosis not present

## 2017-09-18 DIAGNOSIS — N2581 Secondary hyperparathyroidism of renal origin: Secondary | ICD-10-CM | POA: Diagnosis not present

## 2017-09-18 DIAGNOSIS — Z992 Dependence on renal dialysis: Secondary | ICD-10-CM | POA: Diagnosis not present

## 2017-09-18 DIAGNOSIS — D509 Iron deficiency anemia, unspecified: Secondary | ICD-10-CM | POA: Diagnosis not present

## 2017-09-18 DIAGNOSIS — N186 End stage renal disease: Secondary | ICD-10-CM | POA: Diagnosis not present

## 2017-09-18 DIAGNOSIS — E875 Hyperkalemia: Secondary | ICD-10-CM | POA: Diagnosis not present

## 2017-09-20 DIAGNOSIS — Z992 Dependence on renal dialysis: Secondary | ICD-10-CM | POA: Diagnosis not present

## 2017-09-20 DIAGNOSIS — N186 End stage renal disease: Secondary | ICD-10-CM | POA: Diagnosis not present

## 2017-09-20 DIAGNOSIS — N2581 Secondary hyperparathyroidism of renal origin: Secondary | ICD-10-CM | POA: Diagnosis not present

## 2017-09-20 DIAGNOSIS — E875 Hyperkalemia: Secondary | ICD-10-CM | POA: Diagnosis not present

## 2017-09-20 DIAGNOSIS — D509 Iron deficiency anemia, unspecified: Secondary | ICD-10-CM | POA: Diagnosis not present

## 2017-09-22 DIAGNOSIS — Z992 Dependence on renal dialysis: Secondary | ICD-10-CM | POA: Diagnosis not present

## 2017-09-22 DIAGNOSIS — E875 Hyperkalemia: Secondary | ICD-10-CM | POA: Diagnosis not present

## 2017-09-22 DIAGNOSIS — N2581 Secondary hyperparathyroidism of renal origin: Secondary | ICD-10-CM | POA: Diagnosis not present

## 2017-09-22 DIAGNOSIS — D509 Iron deficiency anemia, unspecified: Secondary | ICD-10-CM | POA: Diagnosis not present

## 2017-09-22 DIAGNOSIS — N186 End stage renal disease: Secondary | ICD-10-CM | POA: Diagnosis not present

## 2017-09-23 DIAGNOSIS — N186 End stage renal disease: Secondary | ICD-10-CM | POA: Diagnosis not present

## 2017-09-23 DIAGNOSIS — D509 Iron deficiency anemia, unspecified: Secondary | ICD-10-CM | POA: Diagnosis not present

## 2017-09-23 DIAGNOSIS — E875 Hyperkalemia: Secondary | ICD-10-CM | POA: Diagnosis not present

## 2017-09-23 DIAGNOSIS — N2581 Secondary hyperparathyroidism of renal origin: Secondary | ICD-10-CM | POA: Diagnosis not present

## 2017-09-23 DIAGNOSIS — Z992 Dependence on renal dialysis: Secondary | ICD-10-CM | POA: Diagnosis not present

## 2017-09-25 DIAGNOSIS — N2581 Secondary hyperparathyroidism of renal origin: Secondary | ICD-10-CM | POA: Diagnosis not present

## 2017-09-25 DIAGNOSIS — E875 Hyperkalemia: Secondary | ICD-10-CM | POA: Diagnosis not present

## 2017-09-25 DIAGNOSIS — Z992 Dependence on renal dialysis: Secondary | ICD-10-CM | POA: Diagnosis not present

## 2017-09-25 DIAGNOSIS — N186 End stage renal disease: Secondary | ICD-10-CM | POA: Diagnosis not present

## 2017-09-25 DIAGNOSIS — D509 Iron deficiency anemia, unspecified: Secondary | ICD-10-CM | POA: Diagnosis not present

## 2017-09-26 DIAGNOSIS — G894 Chronic pain syndrome: Secondary | ICD-10-CM | POA: Diagnosis not present

## 2017-09-26 DIAGNOSIS — Z79891 Long term (current) use of opiate analgesic: Secondary | ICD-10-CM | POA: Diagnosis not present

## 2017-09-27 DIAGNOSIS — D509 Iron deficiency anemia, unspecified: Secondary | ICD-10-CM | POA: Diagnosis not present

## 2017-09-27 DIAGNOSIS — N2581 Secondary hyperparathyroidism of renal origin: Secondary | ICD-10-CM | POA: Diagnosis not present

## 2017-09-27 DIAGNOSIS — N186 End stage renal disease: Secondary | ICD-10-CM | POA: Diagnosis not present

## 2017-09-27 DIAGNOSIS — Z992 Dependence on renal dialysis: Secondary | ICD-10-CM | POA: Diagnosis not present

## 2017-09-27 DIAGNOSIS — E875 Hyperkalemia: Secondary | ICD-10-CM | POA: Diagnosis not present

## 2017-09-29 DIAGNOSIS — D509 Iron deficiency anemia, unspecified: Secondary | ICD-10-CM | POA: Diagnosis not present

## 2017-09-29 DIAGNOSIS — Z992 Dependence on renal dialysis: Secondary | ICD-10-CM | POA: Diagnosis not present

## 2017-09-29 DIAGNOSIS — N2581 Secondary hyperparathyroidism of renal origin: Secondary | ICD-10-CM | POA: Diagnosis not present

## 2017-09-29 DIAGNOSIS — E875 Hyperkalemia: Secondary | ICD-10-CM | POA: Diagnosis not present

## 2017-09-29 DIAGNOSIS — N186 End stage renal disease: Secondary | ICD-10-CM | POA: Diagnosis not present

## 2017-10-02 DIAGNOSIS — Z992 Dependence on renal dialysis: Secondary | ICD-10-CM | POA: Diagnosis not present

## 2017-10-02 DIAGNOSIS — D509 Iron deficiency anemia, unspecified: Secondary | ICD-10-CM | POA: Diagnosis not present

## 2017-10-02 DIAGNOSIS — Z794 Long term (current) use of insulin: Secondary | ICD-10-CM | POA: Diagnosis not present

## 2017-10-02 DIAGNOSIS — N2581 Secondary hyperparathyroidism of renal origin: Secondary | ICD-10-CM | POA: Diagnosis not present

## 2017-10-02 DIAGNOSIS — E109 Type 1 diabetes mellitus without complications: Secondary | ICD-10-CM | POA: Diagnosis not present

## 2017-10-02 DIAGNOSIS — N186 End stage renal disease: Secondary | ICD-10-CM | POA: Diagnosis not present

## 2017-10-02 DIAGNOSIS — E875 Hyperkalemia: Secondary | ICD-10-CM | POA: Diagnosis not present

## 2017-10-03 DIAGNOSIS — M47816 Spondylosis without myelopathy or radiculopathy, lumbar region: Secondary | ICD-10-CM | POA: Diagnosis not present

## 2017-10-03 DIAGNOSIS — G894 Chronic pain syndrome: Secondary | ICD-10-CM | POA: Diagnosis not present

## 2017-10-03 DIAGNOSIS — M545 Low back pain: Secondary | ICD-10-CM | POA: Diagnosis not present

## 2017-10-03 DIAGNOSIS — M169 Osteoarthritis of hip, unspecified: Secondary | ICD-10-CM | POA: Diagnosis not present

## 2017-10-04 DIAGNOSIS — E875 Hyperkalemia: Secondary | ICD-10-CM | POA: Diagnosis not present

## 2017-10-04 DIAGNOSIS — Z992 Dependence on renal dialysis: Secondary | ICD-10-CM | POA: Diagnosis not present

## 2017-10-04 DIAGNOSIS — N2581 Secondary hyperparathyroidism of renal origin: Secondary | ICD-10-CM | POA: Diagnosis not present

## 2017-10-04 DIAGNOSIS — D509 Iron deficiency anemia, unspecified: Secondary | ICD-10-CM | POA: Diagnosis not present

## 2017-10-04 DIAGNOSIS — N186 End stage renal disease: Secondary | ICD-10-CM | POA: Diagnosis not present

## 2017-10-06 DIAGNOSIS — E875 Hyperkalemia: Secondary | ICD-10-CM | POA: Diagnosis not present

## 2017-10-06 DIAGNOSIS — D509 Iron deficiency anemia, unspecified: Secondary | ICD-10-CM | POA: Diagnosis not present

## 2017-10-06 DIAGNOSIS — Z992 Dependence on renal dialysis: Secondary | ICD-10-CM | POA: Diagnosis not present

## 2017-10-06 DIAGNOSIS — N186 End stage renal disease: Secondary | ICD-10-CM | POA: Diagnosis not present

## 2017-10-06 DIAGNOSIS — N2581 Secondary hyperparathyroidism of renal origin: Secondary | ICD-10-CM | POA: Diagnosis not present

## 2017-10-09 DIAGNOSIS — N186 End stage renal disease: Secondary | ICD-10-CM | POA: Diagnosis not present

## 2017-10-09 DIAGNOSIS — E875 Hyperkalemia: Secondary | ICD-10-CM | POA: Diagnosis not present

## 2017-10-09 DIAGNOSIS — N2581 Secondary hyperparathyroidism of renal origin: Secondary | ICD-10-CM | POA: Diagnosis not present

## 2017-10-09 DIAGNOSIS — Z992 Dependence on renal dialysis: Secondary | ICD-10-CM | POA: Diagnosis not present

## 2017-10-09 DIAGNOSIS — D509 Iron deficiency anemia, unspecified: Secondary | ICD-10-CM | POA: Diagnosis not present

## 2017-10-11 DIAGNOSIS — N186 End stage renal disease: Secondary | ICD-10-CM | POA: Diagnosis not present

## 2017-10-11 DIAGNOSIS — E875 Hyperkalemia: Secondary | ICD-10-CM | POA: Diagnosis not present

## 2017-10-11 DIAGNOSIS — D509 Iron deficiency anemia, unspecified: Secondary | ICD-10-CM | POA: Diagnosis not present

## 2017-10-11 DIAGNOSIS — N2581 Secondary hyperparathyroidism of renal origin: Secondary | ICD-10-CM | POA: Diagnosis not present

## 2017-10-11 DIAGNOSIS — Z992 Dependence on renal dialysis: Secondary | ICD-10-CM | POA: Diagnosis not present

## 2017-10-13 DIAGNOSIS — N186 End stage renal disease: Secondary | ICD-10-CM | POA: Diagnosis not present

## 2017-10-13 DIAGNOSIS — N2581 Secondary hyperparathyroidism of renal origin: Secondary | ICD-10-CM | POA: Diagnosis not present

## 2017-10-13 DIAGNOSIS — D509 Iron deficiency anemia, unspecified: Secondary | ICD-10-CM | POA: Diagnosis not present

## 2017-10-13 DIAGNOSIS — Z992 Dependence on renal dialysis: Secondary | ICD-10-CM | POA: Diagnosis not present

## 2017-10-13 DIAGNOSIS — E875 Hyperkalemia: Secondary | ICD-10-CM | POA: Diagnosis not present

## 2017-10-16 DIAGNOSIS — Z992 Dependence on renal dialysis: Secondary | ICD-10-CM | POA: Diagnosis not present

## 2017-10-16 DIAGNOSIS — N186 End stage renal disease: Secondary | ICD-10-CM | POA: Diagnosis not present

## 2017-10-16 DIAGNOSIS — E875 Hyperkalemia: Secondary | ICD-10-CM | POA: Diagnosis not present

## 2017-10-16 DIAGNOSIS — D509 Iron deficiency anemia, unspecified: Secondary | ICD-10-CM | POA: Diagnosis not present

## 2017-10-16 DIAGNOSIS — N2581 Secondary hyperparathyroidism of renal origin: Secondary | ICD-10-CM | POA: Diagnosis not present

## 2017-10-18 DIAGNOSIS — Z992 Dependence on renal dialysis: Secondary | ICD-10-CM | POA: Diagnosis not present

## 2017-10-18 DIAGNOSIS — D509 Iron deficiency anemia, unspecified: Secondary | ICD-10-CM | POA: Diagnosis not present

## 2017-10-18 DIAGNOSIS — N186 End stage renal disease: Secondary | ICD-10-CM | POA: Diagnosis not present

## 2017-10-20 DIAGNOSIS — Z992 Dependence on renal dialysis: Secondary | ICD-10-CM | POA: Diagnosis not present

## 2017-10-20 DIAGNOSIS — D509 Iron deficiency anemia, unspecified: Secondary | ICD-10-CM | POA: Diagnosis not present

## 2017-10-20 DIAGNOSIS — N186 End stage renal disease: Secondary | ICD-10-CM | POA: Diagnosis not present

## 2017-10-23 DIAGNOSIS — Z992 Dependence on renal dialysis: Secondary | ICD-10-CM | POA: Diagnosis not present

## 2017-10-23 DIAGNOSIS — N186 End stage renal disease: Secondary | ICD-10-CM | POA: Diagnosis not present

## 2017-10-23 DIAGNOSIS — D509 Iron deficiency anemia, unspecified: Secondary | ICD-10-CM | POA: Diagnosis not present

## 2017-10-25 DIAGNOSIS — D509 Iron deficiency anemia, unspecified: Secondary | ICD-10-CM | POA: Diagnosis not present

## 2017-10-25 DIAGNOSIS — N186 End stage renal disease: Secondary | ICD-10-CM | POA: Diagnosis not present

## 2017-10-25 DIAGNOSIS — Z992 Dependence on renal dialysis: Secondary | ICD-10-CM | POA: Diagnosis not present

## 2017-10-26 ENCOUNTER — Other Ambulatory Visit (HOSPITAL_COMMUNITY): Payer: Self-pay | Admitting: Neurology

## 2017-10-26 DIAGNOSIS — R4182 Altered mental status, unspecified: Secondary | ICD-10-CM

## 2017-10-27 DIAGNOSIS — N186 End stage renal disease: Secondary | ICD-10-CM | POA: Diagnosis not present

## 2017-10-27 DIAGNOSIS — Z992 Dependence on renal dialysis: Secondary | ICD-10-CM | POA: Diagnosis not present

## 2017-10-27 DIAGNOSIS — D509 Iron deficiency anemia, unspecified: Secondary | ICD-10-CM | POA: Diagnosis not present

## 2017-10-30 DIAGNOSIS — N186 End stage renal disease: Secondary | ICD-10-CM | POA: Diagnosis not present

## 2017-10-30 DIAGNOSIS — D509 Iron deficiency anemia, unspecified: Secondary | ICD-10-CM | POA: Diagnosis not present

## 2017-10-30 DIAGNOSIS — Z992 Dependence on renal dialysis: Secondary | ICD-10-CM | POA: Diagnosis not present

## 2017-10-31 DIAGNOSIS — Z79891 Long term (current) use of opiate analgesic: Secondary | ICD-10-CM | POA: Diagnosis not present

## 2017-10-31 DIAGNOSIS — M169 Osteoarthritis of hip, unspecified: Secondary | ICD-10-CM | POA: Diagnosis not present

## 2017-10-31 DIAGNOSIS — G894 Chronic pain syndrome: Secondary | ICD-10-CM | POA: Diagnosis not present

## 2017-10-31 DIAGNOSIS — M47816 Spondylosis without myelopathy or radiculopathy, lumbar region: Secondary | ICD-10-CM | POA: Diagnosis not present

## 2017-11-01 DIAGNOSIS — Z992 Dependence on renal dialysis: Secondary | ICD-10-CM | POA: Diagnosis not present

## 2017-11-01 DIAGNOSIS — N186 End stage renal disease: Secondary | ICD-10-CM | POA: Diagnosis not present

## 2017-11-01 DIAGNOSIS — D509 Iron deficiency anemia, unspecified: Secondary | ICD-10-CM | POA: Diagnosis not present

## 2017-11-04 DIAGNOSIS — Z992 Dependence on renal dialysis: Secondary | ICD-10-CM | POA: Diagnosis not present

## 2017-11-04 DIAGNOSIS — D509 Iron deficiency anemia, unspecified: Secondary | ICD-10-CM | POA: Diagnosis not present

## 2017-11-04 DIAGNOSIS — N186 End stage renal disease: Secondary | ICD-10-CM | POA: Diagnosis not present

## 2017-11-06 DIAGNOSIS — D509 Iron deficiency anemia, unspecified: Secondary | ICD-10-CM | POA: Diagnosis not present

## 2017-11-06 DIAGNOSIS — Z992 Dependence on renal dialysis: Secondary | ICD-10-CM | POA: Diagnosis not present

## 2017-11-06 DIAGNOSIS — N186 End stage renal disease: Secondary | ICD-10-CM | POA: Diagnosis not present

## 2017-11-07 ENCOUNTER — Ambulatory Visit (HOSPITAL_COMMUNITY): Payer: Medicare Other

## 2017-11-08 DIAGNOSIS — Z992 Dependence on renal dialysis: Secondary | ICD-10-CM | POA: Diagnosis not present

## 2017-11-08 DIAGNOSIS — N186 End stage renal disease: Secondary | ICD-10-CM | POA: Diagnosis not present

## 2017-11-08 DIAGNOSIS — D509 Iron deficiency anemia, unspecified: Secondary | ICD-10-CM | POA: Diagnosis not present

## 2017-11-09 ENCOUNTER — Encounter (HOSPITAL_COMMUNITY): Payer: Self-pay

## 2017-11-09 ENCOUNTER — Emergency Department (HOSPITAL_COMMUNITY): Payer: Medicare Other

## 2017-11-09 ENCOUNTER — Observation Stay (HOSPITAL_BASED_OUTPATIENT_CLINIC_OR_DEPARTMENT_OTHER): Payer: Medicare Other

## 2017-11-09 ENCOUNTER — Observation Stay (HOSPITAL_COMMUNITY)
Admission: EM | Admit: 2017-11-09 | Discharge: 2017-11-10 | Disposition: A | Discharge status: Home / Self Care | Payer: Medicare Other | Attending: Internal Medicine | Admitting: Internal Medicine

## 2017-11-09 ENCOUNTER — Ambulatory Visit (HOSPITAL_COMMUNITY)
Admission: EM | Disposition: A | Discharge status: Home / Self Care | Payer: Self-pay | Source: Home / Self Care | Attending: Emergency Medicine

## 2017-11-09 ENCOUNTER — Other Ambulatory Visit: Payer: Self-pay

## 2017-11-09 DIAGNOSIS — I2584 Coronary atherosclerosis due to calcified coronary lesion: Secondary | ICD-10-CM | POA: Insufficient documentation

## 2017-11-09 DIAGNOSIS — I10 Essential (primary) hypertension: Secondary | ICD-10-CM | POA: Diagnosis not present

## 2017-11-09 DIAGNOSIS — E1022 Type 1 diabetes mellitus with diabetic chronic kidney disease: Secondary | ICD-10-CM | POA: Diagnosis not present

## 2017-11-09 DIAGNOSIS — Z7982 Long term (current) use of aspirin: Secondary | ICD-10-CM | POA: Insufficient documentation

## 2017-11-09 DIAGNOSIS — E039 Hypothyroidism, unspecified: Secondary | ICD-10-CM | POA: Diagnosis not present

## 2017-11-09 DIAGNOSIS — E119 Type 2 diabetes mellitus without complications: Secondary | ICD-10-CM

## 2017-11-09 DIAGNOSIS — I252 Old myocardial infarction: Secondary | ICD-10-CM | POA: Insufficient documentation

## 2017-11-09 DIAGNOSIS — F17210 Nicotine dependence, cigarettes, uncomplicated: Secondary | ICD-10-CM | POA: Diagnosis not present

## 2017-11-09 DIAGNOSIS — I120 Hypertensive chronic kidney disease with stage 5 chronic kidney disease or end stage renal disease: Secondary | ICD-10-CM | POA: Diagnosis not present

## 2017-11-09 DIAGNOSIS — Z79899 Other long term (current) drug therapy: Secondary | ICD-10-CM | POA: Diagnosis not present

## 2017-11-09 DIAGNOSIS — I214 Non-ST elevation (NSTEMI) myocardial infarction: Principal | ICD-10-CM | POA: Insufficient documentation

## 2017-11-09 DIAGNOSIS — Z794 Long term (current) use of insulin: Secondary | ICD-10-CM | POA: Diagnosis not present

## 2017-11-09 DIAGNOSIS — I2510 Atherosclerotic heart disease of native coronary artery without angina pectoris: Secondary | ICD-10-CM | POA: Insufficient documentation

## 2017-11-09 DIAGNOSIS — N186 End stage renal disease: Secondary | ICD-10-CM | POA: Insufficient documentation

## 2017-11-09 DIAGNOSIS — R0789 Other chest pain: Secondary | ICD-10-CM | POA: Diagnosis not present

## 2017-11-09 DIAGNOSIS — Z992 Dependence on renal dialysis: Secondary | ICD-10-CM | POA: Diagnosis not present

## 2017-11-09 DIAGNOSIS — R079 Chest pain, unspecified: Secondary | ICD-10-CM | POA: Diagnosis present

## 2017-11-09 DIAGNOSIS — I2582 Chronic total occlusion of coronary artery: Secondary | ICD-10-CM | POA: Diagnosis not present

## 2017-11-09 DIAGNOSIS — E785 Hyperlipidemia, unspecified: Secondary | ICD-10-CM | POA: Diagnosis not present

## 2017-11-09 DIAGNOSIS — Z7902 Long term (current) use of antithrombotics/antiplatelets: Secondary | ICD-10-CM | POA: Diagnosis not present

## 2017-11-09 DIAGNOSIS — Z792 Long term (current) use of antibiotics: Secondary | ICD-10-CM | POA: Insufficient documentation

## 2017-11-09 DIAGNOSIS — Z79890 Hormone replacement therapy (postmenopausal): Secondary | ICD-10-CM | POA: Diagnosis not present

## 2017-11-09 DIAGNOSIS — R0602 Shortness of breath: Secondary | ICD-10-CM | POA: Diagnosis not present

## 2017-11-09 DIAGNOSIS — G35 Multiple sclerosis: Secondary | ICD-10-CM | POA: Insufficient documentation

## 2017-11-09 DIAGNOSIS — Z955 Presence of coronary angioplasty implant and graft: Secondary | ICD-10-CM | POA: Insufficient documentation

## 2017-11-09 DIAGNOSIS — IMO0001 Reserved for inherently not codable concepts without codable children: Secondary | ICD-10-CM

## 2017-11-09 HISTORY — DX: Dependence on renal dialysis: Z99.2

## 2017-11-09 HISTORY — DX: Type 1 diabetes mellitus without complications: E10.9

## 2017-11-09 HISTORY — DX: Gastro-esophageal reflux disease without esophagitis: K21.9

## 2017-11-09 HISTORY — DX: Hypothyroidism, unspecified: E03.9

## 2017-11-09 HISTORY — DX: Malignant neoplasm of thyroid gland: C73

## 2017-11-09 HISTORY — PX: LEFT HEART CATH AND CORONARY ANGIOGRAPHY: CATH118249

## 2017-11-09 HISTORY — DX: Dependence on renal dialysis: N18.6

## 2017-11-09 HISTORY — PX: CARDIAC CATHETERIZATION: SHX172

## 2017-11-09 HISTORY — DX: Pure hypercholesterolemia, unspecified: E78.00

## 2017-11-09 LAB — BASIC METABOLIC PANEL
ANION GAP: 13 (ref 5–15)
BUN: 16 mg/dL (ref 6–20)
CO2: 27 mmol/L (ref 22–32)
CREATININE: 2.68 mg/dL — AB (ref 0.44–1.00)
Calcium: 8.4 mg/dL — ABNORMAL LOW (ref 8.9–10.3)
Chloride: 97 mmol/L — ABNORMAL LOW (ref 101–111)
GFR calc Af Amer: 23 mL/min — ABNORMAL LOW (ref >60)
GFR calc non Af Amer: 20 mL/min — ABNORMAL LOW (ref >60)
Glucose, Bld: 231 mg/dL — ABNORMAL HIGH (ref 65–99)
POTASSIUM: 3.1 mmol/L — AB (ref 3.5–5.1)
SODIUM: 137 mmol/L (ref 135–145)

## 2017-11-09 LAB — CBG MONITORING, ED
GLUCOSE-CAPILLARY: 119 mg/dL — AB (ref 65–99)
Glucose-Capillary: 187 mg/dL — ABNORMAL HIGH (ref 65–99)
Glucose-Capillary: 97 mg/dL (ref 65–99)

## 2017-11-09 LAB — CBC
HEMATOCRIT: 37.2 % (ref 36.0–46.0)
HEMOGLOBIN: 13.5 g/dL (ref 12.0–15.0)
MCH: 33.3 pg (ref 26.0–34.0)
MCHC: 36.3 g/dL — ABNORMAL HIGH (ref 30.0–36.0)
MCV: 91.9 fL (ref 78.0–100.0)
PLATELETS: 183 10*3/uL (ref 150–400)
RBC: 4.05 MIL/uL (ref 3.87–5.11)
RDW: 13.7 % (ref 11.5–15.5)
WBC: 7.3 10*3/uL (ref 4.0–10.5)

## 2017-11-09 LAB — I-STAT TROPONIN, ED: Troponin i, poc: 0.01 ng/mL (ref 0.00–0.08)

## 2017-11-09 LAB — ECHOCARDIOGRAM COMPLETE
AOASC: 32 cm
CHL CUP MV DEC (S): 204
CHL CUP REG VEL DIAS: 107 cm/s
E decel time: 204 msec
E/e' ratio: 16.49
FS: 38 % (ref 28–44)
IVS/LV PW RATIO, ED: 0.83
LA vol index: 58.7 mL/m2
LADIAMINDEX: 2.8 cm/m2
LASIZE: 51 mm
LAVOL: 107 mL
LAVOLA4C: 94.3 mL
LDCA: 3.14 cm2
LEFT ATRIUM END SYS DIAM: 51 mm
LV e' LATERAL: 7.4 cm/s
LVEEAVG: 16.49
LVEEMED: 16.49
LVOT diameter: 20 mm
Lateral S' vel: 9.57 cm/s
MV Peak grad: 6 mmHg
MVPKAVEL: 96.5 m/s
MVPKEVEL: 122 m/s
PW: 14.3 mm — AB (ref 0.6–1.1)
TAPSE: 28.7 mm
TDI e' lateral: 7.4
TDI e' medial: 4.9

## 2017-11-09 LAB — PROTIME-INR
INR: 0.97
Prothrombin Time: 12.8 seconds (ref 11.4–15.2)

## 2017-11-09 LAB — GLUCOSE, CAPILLARY: Glucose-Capillary: 139 mg/dL — ABNORMAL HIGH (ref 65–99)

## 2017-11-09 LAB — HIV ANTIBODY (ROUTINE TESTING W REFLEX): HIV Screen 4th Generation wRfx: NONREACTIVE

## 2017-11-09 LAB — I-STAT BETA HCG BLOOD, ED (MC, WL, AP ONLY)

## 2017-11-09 LAB — TROPONIN I
TROPONIN I: 0.11 ng/mL — AB (ref <0.03)
Troponin I: <0.03 ng/mL (ref <0.03)
Troponin I: 0.06 ng/mL (ref <0.03)
Troponin I: 0.17 ng/mL (ref <0.03)

## 2017-11-09 SURGERY — LEFT HEART CATH AND CORONARY ANGIOGRAPHY
Anesthesia: LOCAL

## 2017-11-09 MED ORDER — FENTANYL CITRATE (PF) 100 MCG/2ML IJ SOLN
INTRAMUSCULAR | Status: DC | PRN
  Administered 2017-11-09 (×4): 25 ug via INTRAVENOUS

## 2017-11-09 MED ORDER — MIDAZOLAM HCL 2 MG/2ML IJ SOLN
INTRAMUSCULAR | Status: DC | PRN
  Administered 2017-11-09 (×4): 1 mg via INTRAVENOUS

## 2017-11-09 MED ORDER — IOPAMIDOL (ISOVUE-370) INJECTION 76%
INTRAVENOUS | Status: AC
  Filled 2017-11-09: qty 125

## 2017-11-09 MED ORDER — SEVELAMER CARBONATE 800 MG PO TABS
1600.0000 mg | ORAL_TABLET | ORAL | Status: DC
  Filled 2017-11-09 (×3): qty 2

## 2017-11-09 MED ORDER — HEPARIN (PORCINE) IN NACL 2-0.9 UNIT/ML-% IJ SOLN
INTRAMUSCULAR | Status: AC | PRN
  Administered 2017-11-09: 1000 mL

## 2017-11-09 MED ORDER — SODIUM CHLORIDE 0.9% FLUSH
3.0000 mL | Freq: Two times a day (BID) | INTRAVENOUS | Status: DC
  Administered 2017-11-09 – 2017-11-10 (×2): 3 mL via INTRAVENOUS

## 2017-11-09 MED ORDER — SODIUM CHLORIDE 0.9% FLUSH: 3.0000 mL | INTRAVENOUS | Status: DC | PRN

## 2017-11-09 MED ORDER — ASPIRIN EC 81 MG PO TBEC: 81.0000 mg | DELAYED_RELEASE_TABLET | ORAL | Status: DC

## 2017-11-09 MED ORDER — CLOPIDOGREL BISULFATE 75 MG PO TABS
75.0000 mg | ORAL_TABLET | ORAL | Status: DC
  Administered 2017-11-09: 75 mg via ORAL
  Filled 2017-11-09: qty 1

## 2017-11-09 MED ORDER — IOPAMIDOL (ISOVUE-370) INJECTION 76%
INTRAVENOUS | Status: DC | PRN
  Administered 2017-11-09: 90 mL

## 2017-11-09 MED ORDER — INSULIN ASPART 100 UNIT/ML ~~LOC~~ SOLN
0.0000 [IU] | SUBCUTANEOUS | Status: DC
  Administered 2017-11-09: 2 [IU] via SUBCUTANEOUS
  Administered 2017-11-09: 1 [IU] via SUBCUTANEOUS
  Filled 2017-11-09: qty 1

## 2017-11-09 MED ORDER — LIDOCAINE HCL 1 % IJ SOLN
INTRAMUSCULAR | Status: AC
  Filled 2017-11-09: qty 20

## 2017-11-09 MED ORDER — ONDANSETRON HCL 4 MG/2ML IJ SOLN: 4.0000 mg | Freq: Four times a day (QID) | INTRAMUSCULAR | Status: DC | PRN

## 2017-11-09 MED ORDER — MIDAZOLAM HCL 2 MG/2ML IJ SOLN
INTRAMUSCULAR | Status: AC
  Filled 2017-11-09: qty 2

## 2017-11-09 MED ORDER — HEPARIN SODIUM (PORCINE) 5000 UNIT/ML IJ SOLN
5000.0000 [IU] | Freq: Three times a day (TID) | INTRAMUSCULAR | Status: DC
  Administered 2017-11-10: 5000 [IU] via SUBCUTANEOUS
  Filled 2017-11-09: qty 1

## 2017-11-09 MED ORDER — FENTANYL CITRATE (PF) 100 MCG/2ML IJ SOLN
INTRAMUSCULAR | Status: AC
  Filled 2017-11-09: qty 2

## 2017-11-09 MED ORDER — LEVOTHYROXINE SODIUM 75 MCG PO TABS
150.0000 ug | ORAL_TABLET | Freq: Every day | ORAL | Status: DC
  Administered 2017-11-09 – 2017-11-10 (×2): 150 ug via ORAL
  Filled 2017-11-09: qty 1
  Filled 2017-11-09: qty 2

## 2017-11-09 MED ORDER — SODIUM CHLORIDE 0.9 % IV SOLN: INTRAVENOUS | Status: DC

## 2017-11-09 MED ORDER — LIDOCAINE HCL (PF) 1 % IJ SOLN
INTRAMUSCULAR | Status: DC | PRN
  Administered 2017-11-09: 15 mL
  Administered 2017-11-09: 2 mL

## 2017-11-09 MED ORDER — OXYCODONE HCL 5 MG PO TABS
10.0000 mg | ORAL_TABLET | Freq: Four times a day (QID) | ORAL | Status: DC | PRN
  Administered 2017-11-10: 10 mg via ORAL
  Filled 2017-11-09: qty 2

## 2017-11-09 MED ORDER — SODIUM CHLORIDE 0.9 % IV SOLN: 250.0000 mL | INTRAVENOUS | Status: DC | PRN

## 2017-11-09 MED ORDER — SODIUM CHLORIDE 0.9% FLUSH: 3.0000 mL | Freq: Two times a day (BID) | INTRAVENOUS | Status: DC

## 2017-11-09 MED ORDER — ATORVASTATIN CALCIUM 80 MG PO TABS
80.0000 mg | ORAL_TABLET | Freq: Every day | ORAL | Status: DC
  Administered 2017-11-09: 80 mg via ORAL
  Filled 2017-11-09: qty 1

## 2017-11-09 MED ORDER — INSULIN GLARGINE 100 UNIT/ML ~~LOC~~ SOLN
15.0000 [IU] | Freq: Every day | SUBCUTANEOUS | Status: DC
  Administered 2017-11-09: 15 [IU] via SUBCUTANEOUS
  Filled 2017-11-09: qty 0.15

## 2017-11-09 MED ORDER — HEPARIN (PORCINE) IN NACL 2-0.9 UNIT/ML-% IJ SOLN
INTRAMUSCULAR | Status: AC
  Filled 2017-11-09: qty 1000

## 2017-11-09 MED ORDER — HEPARIN SODIUM (PORCINE) 5000 UNIT/ML IJ SOLN
5000.0000 [IU] | Freq: Three times a day (TID) | INTRAMUSCULAR | Status: DC
  Administered 2017-11-09: 5000 [IU] via SUBCUTANEOUS
  Filled 2017-11-09: qty 1

## 2017-11-09 MED ORDER — VERAPAMIL HCL 2.5 MG/ML IV SOLN
INTRAVENOUS | Status: DC | PRN
  Administered 2017-11-09 (×2): 10 mL via INTRA_ARTERIAL

## 2017-11-09 MED ORDER — PROMETHAZINE HCL 25 MG PO TABS: 25.0000 mg | ORAL_TABLET | Freq: Four times a day (QID) | ORAL | Status: DC | PRN

## 2017-11-09 MED ORDER — ACETAMINOPHEN 325 MG PO TABS: 650.0000 mg | ORAL_TABLET | ORAL | Status: DC | PRN

## 2017-11-09 MED ORDER — HEPARIN SODIUM (PORCINE) 1000 UNIT/ML IJ SOLN
INTRAMUSCULAR | Status: DC | PRN
  Administered 2017-11-09: 4500 [IU] via INTRAVENOUS

## 2017-11-09 MED ORDER — METOPROLOL SUCCINATE ER 100 MG PO TB24
200.0000 mg | ORAL_TABLET | ORAL | Status: DC
  Administered 2017-11-09: 200 mg via ORAL
  Filled 2017-11-09 (×2): qty 2

## 2017-11-09 MED ORDER — VERAPAMIL HCL 2.5 MG/ML IV SOLN
INTRAVENOUS | Status: AC
  Filled 2017-11-09: qty 2

## 2017-11-09 SURGICAL SUPPLY — 13 items
CATH 5FR JL3.5 JR4 ANG PIG MP (CATHETERS) ×2 IMPLANT
DEVICE RAD COMP TR BAND LRG (VASCULAR PRODUCTS) ×2 IMPLANT
GLIDESHEATH SLEND SS 6F .021 (SHEATH) ×2 IMPLANT
GUIDEWIRE INQWIRE 1.5J.035X260 (WIRE) ×1 IMPLANT
INQWIRE 1.5J .035X260CM (WIRE) ×2
KIT HEART LEFT (KITS) ×2 IMPLANT
PACK CARDIAC CATHETERIZATION (CUSTOM PROCEDURE TRAY) ×2 IMPLANT
SHEATH PINNACLE 5F 10CM (SHEATH) ×2 IMPLANT
SYR MEDRAD MARK V 150ML (SYRINGE) ×2 IMPLANT
TRANSDUCER W/STOPCOCK (MISCELLANEOUS) ×2 IMPLANT
TUBING CIL FLEX 10 FLL-RA (TUBING) ×2 IMPLANT
WIRE EMERALD 3MM-J .035X150CM (WIRE) ×2 IMPLANT
WIRE HI TORQ VERSACORE-J 145CM (WIRE) ×2 IMPLANT

## 2017-11-09 NOTE — ED Notes (Signed)
Pt will not keep bp cuff on. Reminded pt of this.

## 2017-11-09 NOTE — ED Notes (Signed)
Patient denies pain and is resting comfortably.  

## 2017-11-09 NOTE — ED Provider Notes (Signed)
TIME SEEN: 2:06 AM  CHIEF COMPLAINT: Chest pain  HPI: Patient is a 47 year old female with history of CAD status post 4 stents, hypertension, diabetes, hyperlipidemia, hypothyroidism, end-stage renal disease on hemo-dialysis Monday, Wednesday and Friday who was last dialyzed yesterday who presents to the emergency department with chest tightness that started tonight.  Describes it as left-sided without radiation with associated shortness of breath, diaphoresis.  No nausea, vomiting, dizziness.  Took two 324 mg aspirins at home.  Was not given any medication with EMS.  EMS reports initial EKG abnormal and they were concerned about MI but patient chest pain-free at that time.  Patient is status post bilateral BKA.  States she has had a stress test with her cardiologist Dr. Bronson Ing in 2018.  She has had recent productive cough but no fever.  Pain is not worse with coughing.  No aggravating or relieving factors.  ROS: See HPI Constitutional: no fever  Eyes: no drainage  ENT: no runny nose   Cardiovascular:   chest pain  Resp:  SOB  GI: no vomiting GU: no dysuria Integumentary: no rash  Allergy: no hives  Musculoskeletal: no leg swelling  Neurological: no slurred speech ROS otherwise negative  PAST MEDICAL HISTORY/PAST SURGICAL HISTORY:  Past Medical History:  Diagnosis Date  . Chronic kidney disease    End stage renal disease, will be starting dialysis on 10/08/13  . Complication of anesthesia   . Coronary artery disease   . Diabetes mellitus without complication (HCC)    Type 1  . Environmental allergies    uses inhalers  . Headache(784.0)   . Hypertension   . Multiple sclerosis ([place])    just diagnosed 2014  . Myocardial infarction ([place]) 1998  . Nonalcoholic steatohepatitis (NASH)   . Peripheral vascular disease ([place])    aortic artery occlusion  . PONV (postoperative nausea and vomiting)   . Umbilical hernia   . Umbilical hernia     MEDICATIONS:  Prior to Admission  medications   Medication Sig Start Date End Date Taking? Authorizing Provider  albuterol (PROAIR HFA) 108 (90 BASE) MCG/ACT inhaler Inhale 2 puffs into the lungs 3 (three) times daily as needed for wheezing or shortness of breath (allergies).    [provider]  amLODipine (NORVASC) 5 MG tablet Take 5 mg by mouth daily.    [provider]  aspirin EC 81 MG tablet Take 1 tablet (81 mg total) by mouth daily. 11/08/16   Herminio Commons, MD  atorvastatin (LIPITOR) 40 MG tablet Take 1 tablet (40 mg total) by mouth daily. 04/11/17 07/10/17  Herminio Commons, MD  clopidogrel (PLAVIX) 75 MG tablet Take 75 mg by mouth at bedtime.     [provider]  folic acid-vitamin b complex-vitamin c-selenium-zinc (DIALYVITE) 3 MG TABS tablet Take 1 tablet by mouth daily.    [provider]  furosemide (LASIX) 80 MG tablet Take 1 tablet (80 mg total) by mouth 2 (two) times daily. 08/03/13   Geradine Girt, DO  hydrALAZINE (APRESOLINE) 25 MG tablet Take 75 mg by mouth 2 (two) times daily.     [provider]  insulin glargine (LANTUS) 100 UNIT/ML injection Inject 52 Units into the skin at bedtime.     [provider]  insulin lispro (HUMALOG) 100 UNIT/ML injection Inject 12 Units into the skin 3 (three) times daily before meals.     [provider]  isosorbide mononitrate (IMDUR) 30 MG 24 hr tablet Take 1 tablet (30 mg  total) by mouth daily. 04/11/17 07/10/17  Herminio Commons, MD  levothyroxine (SYNTHROID, LEVOTHROID) 150 MCG tablet Take 150 mcg by mouth daily before breakfast.    [provider]  lidocaine-prilocaine (EMLA) cream Apply 1 application topically as needed (for pain/ apply prior to dialysis).    [provider]  metoprolol (TOPROL-XL) 200 MG 24 hr tablet Take 200 mg by mouth 2 (two) times daily.    [provider]  Omega-3 Fatty Acids (FISH OIL) 1000 MG CAPS Take 1,000 mg by mouth 3 (three) times daily.      [provider]  Oxycodone HCl 10 MG TABS Take 10 mg by mouth every 6 (six) hours as needed.    [provider]  promethazine (PHENERGAN) 25 MG tablet Take 25 mg by mouth every 6 (six) hours as needed for nausea or vomiting.    [provider]  sevelamer carbonate (RENVELA) 800 MG tablet Take 800-1,600 mg by mouth See admin instructions. Take 1 tablet  (800 mg) with snacks and 2 tablets (1600 mg) with meals    [provider]  vancomycin (VANCOCIN) 1 GM/200ML SOLN Inject 200 mLs (1,000 mg total) into the vein every Monday, Wednesday, and Friday with hemodialysis. 09/23/14   Velvet Bathe, MD    ALLERGIES:  Allergies  Allergen Reactions  . Metformin And Related Diarrhea and Nausea And Vomiting  . Chantix [Varenicline]     "EVIL DREAMS"  . Ibuprofen Other (See Comments)    Cannot take because of low kidney function    SOCIAL HISTORY:  Social History   Tobacco Use  . Smoking status: Current Every Day Smoker    Packs/day: 1.00    Types: Cigarettes  . Smokeless tobacco: Never Used  Substance Use Topics  . Alcohol use: No    FAMILY HISTORY: Family History  Problem Relation Age of Onset  . Diabetes Mellitus II Mother   . Ovarian cancer Mother   . CAD Father   . Diabetes Mellitus II Father     EXAM: BP (!) 150/64 (BP Location: Right Arm)   Pulse 68   Temp 99 F (37.2 C) (Oral)   Resp 20   SpO2 96%  CONSTITUTIONAL: Alert and oriented and responds appropriately to questions.  Chronically ill-appearing, in no distress HEAD: Normocephalic EYES: Conjunctivae clear, pupils appear equal, EOMI ENT: normal nose; moist mucous membranes NECK: Supple, no meningismus, no nuchal rigidity, no LAD  CARD: RRR; S1 and S2 appreciated; no murmurs, no clicks, no rubs, no gallops RESP: Normal chest excursion without splinting or tachypnea; breath sounds clear and equal bilaterally; no wheezes, no rhonchi, no rales, no hypoxia or respiratory distress, speaking  full sentences ABD/GI: Normal bowel sounds; non-distended; soft, non-tender, no rebound, no guarding, no peritoneal signs, no hepatosplenomegaly BACK:  The back appears normal and is non-tender to palpation, there is no CVA tenderness EXT: Normal ROM in all joints; non-tender to palpation; no edema; normal capillary refill; no cyanosis, no calf tenderness or swelling; bilateral BKA SKIN: Normal color for age and race; warm; no rash NEURO: Moves all extremities equally PSYCH: The patient's mood and manner are appropriate. Grooming and personal hygiene are appropriate.  MEDICAL DECISION MAKING: Patient here with multiple risk factors for ACS with chest pain.  Currently asymptomatic.  Her EKG does show ST changes in inferior leads that are new compared to previous but nothing that meet STEMI criteria at this time.  Given asymptomatic, doubt PE or dissection.  Will obtain chest x-ray to  also evaluate for pneumonia given recent cough.  Lungs are currently clear and she is not hypoxic.  She does not appear volume overloaded.  ED PROGRESS: Patient's chest x-ray shows stable cardiomegaly with pulmonary interstitial congestion favoring viral illness, atypical pneumonitis.  No infiltrate.  No edema.  Troponin negative.  Given changes in her EKG with multiple risk factors for ACS I have recommended observation for chest pain rule out.  It appears she had a nuclear stress test November 15, 2016 which was high risk.  "Nuclear stress test 11/15/16 demonstrated a large degree of myocardial scar in the inferior, inferoapical, and inferolateral walls with no evidence of ischemia, LVEF 41%." - per cardiology note.     4:11 AM Discussed patient's case with hospitalist, Dr. Alcario Drought.  I have recommended admission and patient (and family if present) agree with this plan. Admitting physician will place admission orders.   I reviewed all nursing notes, vitals, pertinent previous records, EKGs, lab and urine results, imaging  (as available).        EKG Interpretation  Date/Time:  Thursday November 09 2017 01:55:06 EST Ventricular Rate:  68 PR Interval:    QRS Duration: 133 QT Interval:  465 QTC Calculation: 495 R Axis:   69 Text Interpretation:  Sinus rhythm Probable left atrial enlargement Nonspecific intraventricular conduction delay Borderline T abnormalities, inferior leads Confirmed by Pryor Curia [phone]) on 11/09/2017 1:57:10 AM         Ward, Delice Bison, DO 11/09/17 0411

## 2017-11-09 NOTE — Progress Notes (Signed)
  Echocardiogram 2D Echocardiogram has been performed.  Angela Soto 11/09/2017, 2:34 PM

## 2017-11-09 NOTE — Consult Note (Addendum)
Cardiology Consultation:   Patient ID: Angela Soto; [id number]; [birth year]   Admit date: 11/09/2017 Date of Consult: 11/09/2017  Primary Care Provider: Neale Burly, MD  Primary Cardiologist: Kate Sable, MD  Chief Complaint: chest pain  Patient Profile:   Angela Soto is a 47 y.o. female with a hx of ESRD on HD MWF, thyroid ca, CAD s/p PCI x 3-4 (1998 in [place]), ?systolic heart failure (EF 41% on myoview 10/2016, normal EF 12/2015 on dobut stress test), tobacco use, PVD with bilateral BKA, nonalcoholic steatohepatitis, MS, and type 1 DM who is being seen today for the evaluation of chest pain at the request of Dr. Eliseo Squires.  History of Present Illness:   Angela Soto was last seen in clinic with Dr. Bronson Ing on 04/11/2017. She was having continued CP that radiated to left arm, jaw, and back that occurred every 7-10 days. She was started on daily imdur 30 mg daily at that time. Nuclear stress test 11/15/16 demonstrated a large degree of myocardial scar in the inferior, inferoapical, and inferolateral walls with no evidence of ischemia, LVEF 41%.  She presented to the ED this morning after having an episode of left-sided CP r/t left arm associated with SOB and diaphoresis yesterday evening. No associated nausea or vomiting. She had been having a BM when the symptoms came on suddenly. She felt a little dizzy at the time. The pain resolved after about 2 minutes. She then took 324 mg ASA and went to bed. She woke up early this morning and still didn't feel well, so her husband encouraged her to come to the ED. She has not had CP since and feels fine now.   She has felt increased stress lately and wonders if the pain was related to that. She has not noticed any swelling, but has had fluctuating weights at HD over the last week (she also recently got a new cushion on wheelchair, so feels it may be r/t that). She has had productive cough for about a week (yellow-green sputum). No  chills, but reports a "lowgrade fever" at HD. She does clearly remember pain prior to previous PCI, except that it was severe CP and she was very diaphoretic.   She reports that she did not take the imdur that was prescribed last June for long because she didn't like the way it made her feel, but she had not been having CP since that appointment. Prior to that, she described the pain as bad heartburn with pain down left arm that woke her up at night and occurred once every 2 weeks or so. No associated symptoms. It was very different from the pain she felt yesterday.  She lives at home with her husband and uses an IT trainer wheelchair to get around. She takes her meds, but skips some as directed on HD days. She has been on HD for 4 years. She is a current smoker with 3/4-1 ppd x 30 years. No alcohol or drug use.   Pertinent admission labs include: Na 137, hypokalemia with K 3.1, creatinine 2.68, WBC 7.3, hemoglobin 13.5, troponin <0.3 -> 0.06 -> 0.11  CXR: 1. Stable cardiomegaly with mild diffuse interstitial prominence, favored to reflect mild pulmonary interstitial congestion. Bronchiolitis/atypical pneumonitis could be considered in the correct clinical setting. 2. Shallow lung inflation with superimposed mild bibasilar atelectasis.  Past Medical History:  Diagnosis Date  . Chronic kidney disease    End stage renal disease, will be starting dialysis on 10/08/13  .  Complication of anesthesia   . Coronary artery disease   . Diabetes mellitus without complication (HCC)    Type 1  . Environmental allergies    uses inhalers  . Headache(784.0)   . Hypertension   . Multiple sclerosis ([place])    just diagnosed 2014  . Myocardial infarction ([place]) 1998  . Nonalcoholic steatohepatitis (NASH)   . Peripheral vascular disease ([place])    aortic artery occlusion  . PONV (postoperative nausea and vomiting)   . Umbilical hernia   . Umbilical hernia     Past Surgical History:  Procedure Laterality Date    . ABDOMINAL SURGERY  2006   aortic artery occluded, had to "clean it out"  . AMPUTATION Left 09/22/2014   Procedure: AMPUTATION LEFT FOURTH FINGER;  Surgeon: Dayna Barker, MD;  Location: [facility];  Service: Plastics;  Laterality: Left;  . AV FISTULA PLACEMENT Left 09/24/2013   Procedure: LEFT UPPER EXTREMITY ARTERIOVENOUS (AV) FISTULA CREATION BRACHIAL/CEPHALIC;  Surgeon: Elam Dutch, MD;  Location: [facility];  Service: Vascular;  Laterality: Left;  . BASCILIC VEIN TRANSPOSITION Left 02/18/2014   Procedure: BASILIC VEIN TRANSPOSITION - 2ND STAGE LEFT ARM;  Surgeon: Elam Dutch, MD;  Location: [facility];  Service: Vascular;  Laterality: Left;  . BLE amputation BK    . CARDIAC CATHETERIZATION    . cardiac stents    . CORONARY ANGIOPLASTY    . INSERTION OF DIALYSIS CATHETER Right 10/07/2013   Procedure: INSERTION OF DIALYSIS CATHETER;  Surgeon: Conrad Bronson, MD;  Location: [facility];  Service: Vascular;  Laterality: Right;  . IRRIGATION AND DEBRIDEMENT ABSCESS    . SHUNTOGRAM Left 04/17/2014   Procedure: FISTULOGRAM;  Surgeon: Conrad , MD;  Location: [facility] C[facility];  Service: Cardiovascular;  Laterality: Left;     Inpatient Medications: Scheduled Meds: . aspirin EC  81 mg Oral Once per day on Sun Tue Thu Sat  . clopidogrel  75 mg Oral Once per day on Sun Tue Thu Sat  . heparin  5,000 Units Subcutaneous Q8H  . insulin aspart  0-9 Units Subcutaneous Q4H  . insulin glargine  15 Units Subcutaneous QHS  . levothyroxine  150 mcg Oral QAC breakfast  . metoprolol  200 mg Oral 2 times per day on Sun Tue Thu Sat  . sevelamer carbonate  1,600 mg Oral 3 times per day on Sun Tue Thu Sat   Continuous Infusions:  PRN Meds: acetaminophen, ondansetron (ZOFRAN) IV, oxyCODONE, promethazine  Home Meds: Prior to Admission medications   Medication Sig Start Date End Date Taking? Authorizing Provider  aspirin EC 81 MG tablet Take 1 tablet (81 mg total) by mouth daily. Patient taking differently: Take 81 mg by  mouth See admin instructions. Daily on Sunday, Tuesday, Thursday and Saturday 11/08/16  Yes Herminio Commons, MD  clopidogrel (PLAVIX) 75 MG tablet Take 75 mg by mouth See admin instructions. At Bedtime on Sunday, Tuesday, Thursday and Saturday   Yes [provider]  insulin glargine (LANTUS) 100 UNIT/ML injection Inject 52 Units into the skin at bedtime.    Yes [provider]  insulin lispro (HUMALOG) 100 UNIT/ML injection Inject 12 Units into the skin 3 (three) times daily before meals.    Yes [provider]  levothyroxine (SYNTHROID, LEVOTHROID) 150 MCG tablet Take 150 mcg by mouth daily before breakfast.   Yes [provider]  lidocaine-prilocaine (EMLA) cream Apply 1 application topically as needed (for pain/ apply prior to dialysis).   Yes [provider]  metoprolol (TOPROL-XL) 200 MG 24 hr tablet Take 200 mg by mouth See admin instructions. Twice daily on Sunday, Tuesday, Thursday and Saturday   Yes [provider]  Omega-3 Fatty Acids (FISH OIL) 1000 MG CAPS Take 1,000 mg by mouth 3 (three) times daily.    Yes [provider]  Oxycodone HCl 10 MG TABS Take 10 mg by mouth every 6 (six) hours as needed (for pain).    Yes [provider]  promethazine (PHENERGAN) 25 MG tablet Take 25 mg by mouth every 6 (six) hours as needed for nausea or vomiting.   Yes [provider]  sevelamer carbonate (RENVELA) 800 MG tablet Take 800-1,600 mg by mouth See admin instructions. Take 1 tablet  (800 mg) with snacks and 2 tablets (1600 mg) with meals on Sunday, Tuesday, Thursday and Saturday   Yes [provider]  vancomycin (VANCOCIN) 1 GM/200ML SOLN Inject 200 mLs (1,000 mg total) into the vein every Monday, Wednesday, and Friday with hemodialysis. 09/23/14  Yes Velvet Bathe, MD    Allergies:    Allergies  Allergen Reactions  . Metformin And Related Diarrhea and Nausea And Vomiting  . Chantix [Varenicline]      "EVIL DREAMS"  . Ibuprofen Other (See Comments)    Cannot take because of low kidney function    Social History:   Social History   Socioeconomic History  . Marital status: Married    Spouse name: Not on file  . Number of children: Not on file  . Years of education: Not on file  . Highest education level: Not on file  Social Needs  . Financial resource strain: Not on file  . Food insecurity - worry: Not on file  . Food insecurity - inability: Not on file  . Transportation needs - medical: Not on file  . Transportation needs - non-medical: Not on file  Occupational History  . Not on file  Tobacco Use  . Smoking status: Current Every Day Smoker    Packs/day: 1.00    Types: Cigarettes  . Smokeless tobacco: Never Used  Substance and Sexual Activity  . Alcohol use: No  . Drug use: No  . Sexual activity: Not on file  Other Topics Concern  . Not on file  Social History Narrative  . Not on file    Family History:   The patient's family history includes CAD (age of onset: 59) in her father; Diabetes Mellitus II in her father and mother; Ovarian cancer in her mother.  ROS:  Please see the history of present illness.  All other ROS reviewed and negative.     Physical Exam/Data:   Vitals:   11/09/17 0945 11/09/17 0946 11/09/17 1015 11/09/17 1115  BP:  133/66 (!) 138/48 (!) 158/71  Pulse: 63  61 61  Resp: 17  13 13   Temp:      TempSrc:      SpO2: 98%  99% 97%   No intake or output data in the 24 hours ending 11/09/17 1125 There were no vitals filed for this visit. There is no height or weight on file to calculate BMI.  General: Appears chronically ill Head: Normocephalic, atraumatic, sclera non-icteric, no xanthomas, nares are without discharge.  Neck: Negative for carotid bruits. JVD not elevated. Lungs: Diminished in bases to auscultation without wheezes, rales, or rhonchi. Breathing is unlabored. Heart: RRR with S1 S2. 3/6 murmur RUSB Abdomen: Soft, non-tender,  non-distended with normoactive bowel sounds. No hepatomegaly. No rebound/guarding.  No obvious abdominal masses. Msk:  Strength and tone appear normal for age. Extremities: No clubbing or cyanosis. No edema.  Bilateral BKA. LUE fistula Neuro: Alert and oriented X 3. No facial asymmetry. No focal deficit. Moves all extremities spontaneously. Psych:  Responds to questions appropriately with a normal affect.  EKG:  The EKG was personally reviewed and demonstrates NSR 68 bpm with t-wave inversion in III, aVF, V5, and V6 with IVCD.  Previous EKG in 2015 showed flattened T waves in III and aVR with low voltage.  Relevant CV Studies: Nuclear stress test 11/15/16 demonstrated a large degree of myocardial scar in the inferior, inferoapical, and inferolateral walls with no evidence of ischemia, LVEF 41%  Stress Echo 01/05/2016 The patient had no chest pain during stress The patient achieved 67 % of maximum predicted heart rate. Nondiagnostic stress ECG due to subtarget heart rate. Nondiagnostic dobutamine echocardiography due to subtarget heart rate The sensitivity of the stress test might decrease due to resting wall motion  abnormality . Normal left ventricular function and global wall motion with stress No diagnostic ST segment changes were seen. Maximum .74mm ST segment  depression in leads I, aVL and V2. Maximum .57mm ST segment elevation in the  inferolateral leads from previous MI. Nondiagnostic stress ECG due to  subtarget heart rate.  Laboratory Data:  Chemistry Recent Labs  Lab 11/09/17 0207  NA 137  K 3.1*  CL 97*  CO2 27  GLUCOSE 231*  BUN 16  CREATININE 2.68*  CALCIUM 8.4*  GFRNONAA 20*  GFRAA 23*  ANIONGAP 13    No results for input(s): PROT, ALBUMIN, AST, ALT, ALKPHOS, BILITOT in the last 168 hours. Hematology Recent Labs  Lab 11/09/17 0207  WBC 7.3  RBC 4.05  HGB 13.5  HCT 37.2  MCV 91.9  MCH 33.3  MCHC 36.3*  RDW 13.7  PLT 183   Cardiac Enzymes Recent Labs   Lab 11/09/17 0207 11/09/17 0511 11/09/17 0819  TROPONINI <0.03 0.06* 0.11*    Recent Labs  Lab 11/09/17 0215  TROPIPOC 0.01    BNPNo results for input(s): BNP, PROBNP in the last 168 hours.  DDimer No results for input(s): DDIMER in the last 168 hours.  Radiology/Studies:  Dg Chest 2 View  Result Date: 11/09/2017 CLINICAL DATA:  Initial evaluation for acute mid chest pain. EXAM: CHEST  2 VIEW COMPARISON:  Prior radiograph from 01/22/2014. FINDINGS: Mild cardiomegaly, stable.  Mediastinal silhouette normal. Lungs mildly hypoinflated. Streaky left basilar opacity favored to reflect atelectasis. Mild diffuse interstitial prominence favored to be related to mild interstitial edema, although acute bronchiolitis/pneumonitis could also be considered. No frank alveolar edema. No pleural effusion. No pneumothorax. No acute osseus abnormality. IMPRESSION: 1. Stable cardiomegaly with mild diffuse interstitial prominence, favored to reflect mild pulmonary interstitial congestion. Bronchiolitis/atypical pneumonitis could be considered in the correct clinical setting. 2. Shallow lung inflation with superimposed mild bibasilar atelectasis. Electronically Signed   By: Jeannine Boga M.D.   On: 11/09/2017 02:43    Assessment and Plan:   1. Chest pain with Hx of CAD s/p PCI (1998) - Risk factors include: DM, HTN, personal hx of CAD, and current smoker - Troponins are trending up (<0.03 -> 0.06 -> 0.11) and EKG has some twave inversions in inferior and lateral leads.  - TIMI score: 4 points; [place] store: <100 - Continue ASA, plavix, and Toprol XL  - Check FLP tomorrow. She is not currently on a statin, but would benefit given known CAD. - Recommend starting  heparin drip per pharmacy. - Needs an echo. EF 41% on myoview 10/2016, 55-60% on dobutamine stress echo 12/2015 - Needs a LHC given her risk factors, troponins, and EKG changes. Will discuss with attending ischemic work up  2. ESRD - She is  oliguric - HD MWF with Dr. Juanda Crumble. She got dialyzed yesterday. - Creatinine 2.68, K 3.1  3. HTN - Continue Toprol XL - SBP 130-150's  4. ?Chronic systolic heart failure - Needs an echo to verify. EF was 41% on myoview 10/2016. No recent echo on file.  5. Hypokalemia - K 3.1 this am - Defer to nephrology in the setting of ESRD   For questions or updates, please contact [place] Please consult [email or web address] for contact info under Cardiology/STEMI.    Signed, Georgiana Shore, NP  11/09/2017 11:25 AM  History and all data above reviewed.  Patient examined.  I agree with the findings as above.  The patient presents with chest pain.  She also had diaphoresis and SOB.  She does not recall her previous angina.  The pain was at rest and she does have elevated enzymes although this is more non specific with her ESRD.  There is some subtle ST elevation in the inferior leads with T wave inversion. The patient exam reveals COR:RRR  ,  Lungs: Clear  ,  Abd: Positive bowel sounds, no rebound no guarding, Ext bilateral BKA.  Left arm dialysis graf  .  All available labs, radiology testing, previous records reviewed. Agree with documented assessment and plan. Chest pain:  Worrisome for unstable angina.  There are some inferior ST changes.  I am worried about occlusive CAD and ACS.  She needs a cardiac cath.  The patient understands that risks included but are not limited to stroke (1 in 1000), death (1 in 59), bleeding (1 in 200), allergic reaction [possibly serious] (1 in 200).  Unfortunately, access will be an issue.  She has absent femoral pulses and she was told she could not be cathed from the legs again because of "scar tissue".  She thinks her last cath was 10 years or more ago.  We cannot use her left arm and should try to avoid the right secondary to her renal failure but we might need to use the right radial.  We will discuss with the cath lab.  The patient understands and agrees to proceed.    Jeneen Rinks Imajean Mcdermid  12:43 PM  11/09/2017

## 2017-11-09 NOTE — Progress Notes (Signed)
NURSING PROGRESS NOTE  ODYSSEY VASBINDER [id number] Transfer Data: 11/09/2017 4:30 PM Attending Provider: Geradine Girt, DO JYN:[id number], Samul Dada, MD Code Status: full  Bert D Behan is a 47 y.o. female patient transferred from Cath Lab -No acute distress noted.  -No complaints of shortness of breath.  -No complaints of chest pain.   Cardiac Monitoring: Box # 14 in place. Cardiac monitor yields:SB - SR.  Blood pressure (!) 175/77, pulse 64, temperature 99 F (37.2 C), temperature source Oral, resp. rate 16, SpO2 96 %.   IV Fluids:  IV in place, occlusive dsg intact without redness, IV cath NS infusing at 10cc/hr   Allergies:  Metformin and related; Chantix [varenicline]; and Ibuprofen  Past Medical History:   has a past medical history of Chronic kidney disease, Complication of anesthesia, Coronary artery disease, Diabetes mellitus without complication ([place]), Environmental allergies, Headache(784.0), Hypertension, Multiple sclerosis ([place]), Myocardial infarction ([place]) (3086), Nonalcoholic steatohepatitis ([facility]), Peripheral vascular disease ([place]), PONV (postoperative nausea and vomiting), and Umbilical hernia.  Past Surgical History:   has a past surgical history that includes BLE amputation BK; Abdominal surgery (2006); cardiac stents; Cardiac catheterization; Coronary angioplasty; AV fistula placement (Left, 09/24/2013); Insertion of dialysis catheter (Right, 10/07/2013); Irrigation and debridement abscess; Bascilic vein transposition (Left, 02/18/2014); Amputation (Left, 09/22/2014); and shuntogram (Left, 04/17/2014).  Social History:   reports that she has been smoking cigarettes.  She has a 30.00 pack-year smoking history. she has never used smokeless tobacco. She reports that she does not drink alcohol or use drugs.  Skin: intact  Patient is sleepy upon arrival, good circulation noted to R hand distal to the TR Band. B/P is 72/61 from Right thigh, however I do not believe that is  correct, as they had great difficulty obtaining a B/P in the cath lab in either lower extremity. Unable to use the upper extremities as the RUE had the [facility] site and the LUE has the fistula. {t is resting comfortably and both the LLgroin where they attempted access & the R Radial are rated a 0.  Pt is bilateral Lower extremity amputee. Will continue to evaluate and treat per MD orders.

## 2017-11-09 NOTE — ED Notes (Signed)
Cards at bedside

## 2017-11-09 NOTE — Interval H&P Note (Signed)
History and Physical Interval Note:  11/09/2017 2:58 PM  Angela Soto  has presented today for cardiac cath with the diagnosis of NSTEMI. The various methods of treatment have been discussed with the patient and family. After consideration of risks, benefits and other options for treatment, the patient has consented to  Procedure(s): LEFT HEART CATH AND CORONARY ANGIOGRAPHY (N/A) as a surgical intervention .  The patient's history has been reviewed, patient examined, no change in status, stable for surgery.  I have reviewed the patient's chart and labs.  Questions were answered to the patient's satisfaction.    Cath Lab Visit (complete for each Cath Lab visit)  Clinical Evaluation Leading to the Procedure:   ACS: Yes.    Non-ACS:    Anginal Classification: CCS III  Anti-ischemic medical therapy: Maximal Therapy (2 or more classes of medications)  Non-Invasive Test Results: No non-invasive testing performed  Prior CABG: No previous CABG         Lauree Chandler

## 2017-11-09 NOTE — H&P (View-Only) (Signed)
Cardiology Consultation:   Patient ID: Angela Soto; [id number]; Feb 23, 1971   Admit date: 11/09/2017 Date of Consult: 11/09/2017  Primary Care Provider: Neale Burly, MD  Primary Cardiologist: Kate Sable, MD  Chief Complaint: chest pain  Patient Profile:   Angela Soto is a 47 y.o. female with a hx of ESRD on HD MWF, thyroid ca, CAD s/p PCI x 3-4 (1998 in [place]), ?systolic heart failure (EF 41% on myoview 10/2016, normal EF 12/2015 on dobut stress test), tobacco use, PVD with bilateral BKA, nonalcoholic steatohepatitis, MS, and type 1 DM who is being seen today for the evaluation of chest pain at the request of Dr. Eliseo Squires.  History of Present Illness:   Angela Soto was last seen in clinic with Dr. Bronson Ing on 04/11/2017. She was having continued CP that radiated to left arm, jaw, and back that occurred every 7-10 days. She was started on daily imdur 30 mg daily at that time. Nuclear stress test 11/15/16 demonstrated a large degree of myocardial scar in the inferior, inferoapical, and inferolateral walls with no evidence of ischemia, LVEF 41%.  She presented to the ED this morning after having an episode of left-sided CP r/t left arm associated with SOB and diaphoresis yesterday evening. No associated nausea or vomiting. She had been having a BM when the symptoms came on suddenly. She felt a little dizzy at the time. The pain resolved after about 2 minutes. She then took 324 mg ASA and went to bed. She woke up early this morning and still didn't feel well, so her husband encouraged her to come to the ED. She has not had CP since and feels fine now.   She has felt increased stress lately and wonders if the pain was related to that. She has not noticed any swelling, but has had fluctuating weights at HD over the last week (she also recently got a new cushion on wheelchair, so feels it may be r/t that). She has had productive cough for about a week (yellow-green sputum). No  chills, but reports a "lowgrade fever" at HD. She does clearly remember pain prior to previous PCI, except that it was severe CP and she was very diaphoretic.   She reports that she did not take the imdur that was prescribed last June for long because she didn't like the way it made her feel, but she had not been having CP since that appointment. Prior to that, she described the pain as bad heartburn with pain down left arm that woke her up at night and occurred once every 2 weeks or so. No associated symptoms. It was very different from the pain she felt yesterday.  She lives at home with her husband and uses an IT trainer wheelchair to get around. She takes her meds, but skips some as directed on HD days. She has been on HD for 4 years. She is a current smoker with 3/4-1 ppd x 30 years. No alcohol or drug use.   Pertinent admission labs include: Na 137, hypokalemia with K 3.1, creatinine 2.68, WBC 7.3, hemoglobin 13.5, troponin <0.3 -> 0.06 -> 0.11  CXR: 1. Stable cardiomegaly with mild diffuse interstitial prominence, favored to reflect mild pulmonary interstitial congestion. Bronchiolitis/atypical pneumonitis could be considered in the correct clinical setting. 2. Shallow lung inflation with superimposed mild bibasilar atelectasis.  Past Medical History:  Diagnosis Date  . Chronic kidney disease    End stage renal disease, will be starting dialysis on 10/08/13  .  Complication of anesthesia   . Coronary artery disease   . Diabetes mellitus without complication (HCC)    Type 1  . Environmental allergies    uses inhalers  . Headache(784.0)   . Hypertension   . Multiple sclerosis ([place])    just diagnosed 2014  . Myocardial infarction ([place]) 1998  . Nonalcoholic steatohepatitis ([facility])   . Peripheral vascular disease ([place])    aortic artery occlusion  . PONV (postoperative nausea and vomiting)   . Umbilical hernia   . Umbilical hernia     Past Surgical History:  Procedure Laterality Date    . ABDOMINAL SURGERY  2006   aortic artery occluded, had to "clean it out"  . AMPUTATION Left 09/22/2014   Procedure: AMPUTATION LEFT FOURTH FINGER;  Surgeon: Dayna Barker, MD;  Location: [place];  Service: Plastics;  Laterality: Left;  . AV FISTULA PLACEMENT Left 09/24/2013   Procedure: LEFT UPPER EXTREMITY ARTERIOVENOUS (AV) FISTULA CREATION BRACHIAL/CEPHALIC;  Surgeon: Elam Dutch, MD;  Location: [place];  Service: Vascular;  Laterality: Left;  . BASCILIC VEIN TRANSPOSITION Left 02/18/2014   Procedure: BASILIC VEIN TRANSPOSITION - 2ND STAGE LEFT ARM;  Surgeon: Elam Dutch, MD;  Location: [place];  Service: Vascular;  Laterality: Left;  . BLE amputation BK    . CARDIAC CATHETERIZATION    . cardiac stents    . CORONARY ANGIOPLASTY    . INSERTION OF DIALYSIS CATHETER Right 10/07/2013   Procedure: INSERTION OF DIALYSIS CATHETER;  Surgeon: Conrad Eldorado, MD;  Location: [place];  Service: Vascular;  Laterality: Right;  . IRRIGATION AND DEBRIDEMENT ABSCESS    . SHUNTOGRAM Left 04/17/2014   Procedure: FISTULOGRAM;  Surgeon: Conrad Stewardson, MD;  Location[facility] C[facility] LAB;  Service: Cardiovascular;  Laterality: Left;     Inpatient Medications: Scheduled Meds: . aspirin EC  81 mg Oral Once per day on Sun Tue Thu Sat  . clopidogrel  75 mg Oral Once per day on Sun Tue Thu Sat  . heparin  5,000 Units Subcutaneous Q8H  . insulin aspart  0-9 Units Subcutaneous Q4H  . insulin glargine  15 Units Subcutaneous QHS  . levothyroxine  150 mcg Oral QAC breakfast  . metoprolol  200 mg Oral 2 times per day on Sun Tue Thu Sat  . sevelamer carbonate  1,600 mg Oral 3 times per day on Sun Tue Thu Sat   Continuous Infusions:  PRN Meds: acetaminophen, ondansetron (ZOFRAN) IV, oxyCODONE, promethazine  Home Meds: Prior to Admission medications   Medication Sig Start Date End Date Taking? Authorizing Provider  aspirin EC 81 MG tablet Take 1 tablet (81 mg total) by mouth daily. Patient taking differently: Take 81 mg by  mouth See admin instructions. Daily on Sunday, Tuesday, Thursday and Saturday 11/08/16  Yes Herminio Commons, MD  clopidogrel (PLAVIX) 75 MG tablet Take 75 mg by mouth See admin instructions. At Bedtime on Sunday, Tuesday, Thursday and Saturday   Yes [provider]  insulin glargine (LANTUS) 100 UNIT/ML injection Inject 52 Units into the skin at bedtime.    Yes [provider]  insulin lispro (HUMALOG) 100 UNIT/ML injection Inject 12 Units into the skin 3 (three) times daily before meals.    Yes [provider]  levothyroxine (SYNTHROID, LEVOTHROID) 150 MCG tablet Take 150 mcg by mouth daily before breakfast.   Yes [provider]  lidocaine-prilocaine (EMLA) cream Apply 1 application topically as needed (for pain/ apply prior to dialysis).   Yes [provider]  metoprolol (TOPROL-XL) 200 MG 24 hr tablet Take 200 mg by mouth See admin instructions. Twice daily on Sunday, Tuesday, Thursday and Saturday   Yes [provider]  Omega-3 Fatty Acids (FISH OIL) 1000 MG CAPS Take 1,000 mg by mouth 3 (three) times daily.    Yes [provider]  Oxycodone HCl 10 MG TABS Take 10 mg by mouth every 6 (six) hours as needed (for pain).    Yes [provider]  promethazine (PHENERGAN) 25 MG tablet Take 25 mg by mouth every 6 (six) hours as needed for nausea or vomiting.   Yes [provider]  sevelamer carbonate (RENVELA) 800 MG tablet Take 800-1,600 mg by mouth See admin instructions. Take 1 tablet  (800 mg) with snacks and 2 tablets (1600 mg) with meals on Sunday, Tuesday, Thursday and Saturday   Yes [provider]  vancomycin (VANCOCIN) 1 GM/200ML SOLN Inject 200 mLs (1,000 mg total) into the vein every Monday, Wednesday, and Friday with hemodialysis. 09/23/14  Yes Velvet Bathe, MD    Allergies:    Allergies  Allergen Reactions  . Metformin And Related Diarrhea and Nausea And Vomiting  . Chantix [Varenicline]      "EVIL DREAMS"  . Ibuprofen Other (See Comments)    Cannot take because of low kidney function    Social History:   Social History   Socioeconomic History  . Marital status: Married    Spouse name: Not on file  . Number of children: Not on file  . Years of education: Not on file  . Highest education level: Not on file  Social Needs  . Financial resource strain: Not on file  . Food insecurity - worry: Not on file  . Food insecurity - inability: Not on file  . Transportation needs - medical: Not on file  . Transportation needs - non-medical: Not on file  Occupational History  . Not on file  Tobacco Use  . Smoking status: Current Every Day Smoker    Packs/day: 1.00    Types: Cigarettes  . Smokeless tobacco: Never Used  Substance and Sexual Activity  . Alcohol use: No  . Drug use: No  . Sexual activity: Not on file  Other Topics Concern  . Not on file  Social History Narrative  . Not on file    Family History:   The patient's family history includes CAD (age of onset: 53) in her father; Diabetes Mellitus II in her father and mother; Ovarian cancer in her mother.  ROS:  Please see the history of present illness.  All other ROS reviewed and negative.     Physical Exam/Data:   Vitals:   11/09/17 0945 11/09/17 0946 11/09/17 1015 11/09/17 1115  BP:  133/66 (!) 138/48 (!) 158/71  Pulse: 63  61 61  Resp: 17  13 13   Temp:      TempSrc:      SpO2: 98%  99% 97%   No intake or output data in the 24 hours ending 11/09/17 1125 There were no vitals filed for this visit. There is no height or weight on file to calculate BMI.  General: Appears chronically ill Head: Normocephalic, atraumatic, sclera non-icteric, no xanthomas, nares are without discharge.  Neck: Negative for carotid bruits. JVD not elevated. Lungs: Diminished in bases to auscultation without wheezes, rales, or rhonchi. Breathing is unlabored. Heart: RRR with S1 S2. 3/6 murmur RUSB Abdomen: Soft, non-tender,  non-distended with normoactive bowel sounds. No hepatomegaly. No rebound/guarding.  No obvious abdominal masses. Msk:  Strength and tone appear normal for age. Extremities: No clubbing or cyanosis. No edema.  Bilateral BKA. LUE fistula Neuro: Alert and oriented X 3. No facial asymmetry. No focal deficit. Moves all extremities spontaneously. Psych:  Responds to questions appropriately with a normal affect.  EKG:  The EKG was personally reviewed and demonstrates NSR 68 bpm with t-wave inversion in III, aVF, V5, and V6 with IVCD.  Previous EKG in 2015 showed flattened T waves in III and aVR with low voltage.  Relevant CV Studies: Nuclear stress test 11/15/16 demonstrated a large degree of myocardial scar in the inferior, inferoapical, and inferolateral walls with no evidence of ischemia, LVEF 41%  Stress Echo 01/05/2016 The patient had no chest pain during stress The patient achieved 67 % of maximum predicted heart rate. Nondiagnostic stress ECG due to subtarget heart rate. Nondiagnostic dobutamine echocardiography due to subtarget heart rate The sensitivity of the stress test might decrease due to resting wall motion  abnormality . Normal left ventricular function and global wall motion with stress No diagnostic ST segment changes were seen. Maximum .66mm ST segment  depression in leads I, aVL and V2. Maximum .32mm ST segment elevation in the  inferolateral leads from previous MI. Nondiagnostic stress ECG due to  subtarget heart rate.  Laboratory Data:  Chemistry Recent Labs  Lab 11/09/17 0207  NA 137  K 3.1*  CL 97*  CO2 27  GLUCOSE 231*  BUN 16  CREATININE 2.68*  CALCIUM 8.4*  GFRNONAA 20*  GFRAA 23*  ANIONGAP 13    No results for input(s): PROT, ALBUMIN, AST, ALT, ALKPHOS, BILITOT in the last 168 hours. Hematology Recent Labs  Lab 11/09/17 0207  WBC 7.3  RBC 4.05  HGB 13.5  HCT 37.2  MCV 91.9  MCH 33.3  MCHC 36.3*  RDW 13.7  PLT 183   Cardiac Enzymes Recent Labs   Lab 11/09/17 0207 11/09/17 0511 11/09/17 0819  TROPONINI <0.03 0.06* 0.11*    Recent Labs  Lab 11/09/17 0215  TROPIPOC 0.01    BNPNo results for input(s): BNP, PROBNP in the last 168 hours.  DDimer No results for input(s): DDIMER in the last 168 hours.  Radiology/Studies:  Dg Chest 2 View  Result Date: 11/09/2017 CLINICAL DATA:  Initial evaluation for acute mid chest pain. EXAM: CHEST  2 VIEW COMPARISON:  Prior radiograph from 01/22/2014. FINDINGS: Mild cardiomegaly, stable.  Mediastinal silhouette normal. Lungs mildly hypoinflated. Streaky left basilar opacity favored to reflect atelectasis. Mild diffuse interstitial prominence favored to be related to mild interstitial edema, although acute bronchiolitis/pneumonitis could also be considered. No frank alveolar edema. No pleural effusion. No pneumothorax. No acute osseus abnormality. IMPRESSION: 1. Stable cardiomegaly with mild diffuse interstitial prominence, favored to reflect mild pulmonary interstitial congestion. Bronchiolitis/atypical pneumonitis could be considered in the correct clinical setting. 2. Shallow lung inflation with superimposed mild bibasilar atelectasis. Electronically Signed   By: Jeannine Boga M.D.   On: 11/09/2017 02:43    Assessment and Plan:   1. Chest pain with Hx of CAD s/p PCI (1998) - Risk factors include: DM, HTN, personal hx of CAD, and current smoker - Troponins are trending up (<0.03 -> 0.06 -> 0.11) and EKG has some twave inversions in inferior and lateral leads.  - TIMI score: 4 points; [place] store: <100 - Continue ASA, plavix, and Toprol XL  - Check FLP tomorrow. She is not currently on a statin, but would benefit given known CAD. - Recommend starting  heparin drip per pharmacy. - Needs an echo. EF 41% on myoview 10/2016, 55-60% on dobutamine stress echo 12/2015 - Needs a LHC given her risk factors, troponins, and EKG changes. Will discuss with attending ischemic work up  2. ESRD - She is  oliguric - HD MWF with Dr. Juanda Crumble. She got dialyzed yesterday. - Creatinine 2.68, K 3.1  3. HTN - Continue Toprol XL - SBP 130-150's  4. ?Chronic systolic heart failure - Needs an echo to verify. EF was 41% on myoview 10/2016. No recent echo on file.  5. Hypokalemia - K 3.1 this am - Defer to nephrology in the setting of ESRD   For questions or updates, please contact [place] Please consult [email or web address] for contact info under Cardiology/STEMI.    Signed, Georgiana Shore, NP  11/09/2017 11:25 AM  History and all data above reviewed.  Patient examined.  I agree with the findings as above.  The patient presents with chest pain.  She also had diaphoresis and SOB.  She does not recall her previous angina.  The pain was at rest and she does have elevated enzymes although this is more non specific with her ESRD.  There is some subtle ST elevation in the inferior leads with T wave inversion. The patient exam reveals COR:RRR  ,  Lungs: Clear  ,  Abd: Positive bowel sounds, no rebound no guarding, Ext bilateral BKA.  Left arm dialysis graf  .  All available labs, radiology testing, previous records reviewed. Agree with documented assessment and plan. Chest pain:  Worrisome for unstable angina.  There are some inferior ST changes.  I am worried about occlusive CAD and ACS.  She needs a cardiac cath.  The patient understands that risks included but are not limited to stroke (1 in 1000), death (1 in 32), bleeding (1 in 200), allergic reaction [possibly serious] (1 in 200).  Unfortunately, access will be an issue.  She has absent femoral pulses and she was told she could not be cathed from the legs again because of "scar tissue".  She thinks her last cath was 10 years or more ago.  We cannot use her left arm and should try to avoid the right secondary to her renal failure but we might need to use the right radial.  We will discuss with the cath lab.  The patient understands and agrees to proceed.    Jeneen Rinks Katheryne Gorr  12:43 PM  11/09/2017

## 2017-11-09 NOTE — ED Notes (Signed)
Patient transported to X-ray 

## 2017-11-09 NOTE — Progress Notes (Signed)
Patient admitted after midnight, please see H&P.  Troponins are trending up.  Needs cath but limited access.  Appreciate cardiology consult.  Needs HD in AM.  Eulogio Bear DO

## 2017-11-09 NOTE — ED Notes (Signed)
Admitting at bedside 

## 2017-11-09 NOTE — H&P (Signed)
History and Physical    Angela Soto TMH:[id number] DOB: 12-01-1970 DOA: 11/09/2017  PCP: Neale Burly, MD  Patient coming from: Home  I have personally briefly reviewed patient's old medical records in [place]  Chief Complaint: Chest pain  HPI: Angela Soto is a 47 y.o. female with medical history significant of ESRD dialysis MWF, CAD, MI, 4 coronary stents, EF 41% with large inferior wall fixed defect on stress test in Jan 2018, DM1 since age 52.  Patient presents to the ED with c/o chest pain.  Left sided, no radiation, associated with SOB, no N/V.  Took 2x 324 ASA at home.  Not given meds by EMS as CP had resolved and was CP free at that time.   ED Course: Trop neg.  EKG with TWI in inferior leads, looks to be new.   Review of Systems: As per HPI otherwise 10 point review of systems negative.   Past Medical History:  Diagnosis Date  . Chronic kidney disease    End stage renal disease, will be starting dialysis on 10/08/13  . Complication of anesthesia   . Coronary artery disease   . Diabetes mellitus without complication (HCC)    Type 1  . Environmental allergies    uses inhalers  . Headache(784.0)   . Hypertension   . Multiple sclerosis ([place])    just diagnosed 2014  . Myocardial infarction ([place]) 1998  . Nonalcoholic steatohepatitis ([facility])   . Peripheral vascular disease ([place])    aortic artery occlusion  . PONV (postoperative nausea and vomiting)   . Umbilical hernia   . Umbilical hernia     Past Surgical History:  Procedure Laterality Date  . ABDOMINAL SURGERY  2006   aortic artery occluded, had to "clean it out"  . AMPUTATION Left 09/22/2014   Procedure: AMPUTATION LEFT FOURTH FINGER;  Surgeon: Dayna Barker, MD;  Location: [place];  Service: Plastics;  Laterality: Left;  . AV FISTULA PLACEMENT Left 09/24/2013   Procedure: LEFT UPPER EXTREMITY ARTERIOVENOUS (AV) FISTULA CREATION BRACHIAL/CEPHALIC;  Surgeon: Elam Dutch, MD;  Location: [place];  Service: Vascular;  Laterality: Left;  . BASCILIC VEIN TRANSPOSITION Left 02/18/2014   Procedure: BASILIC VEIN TRANSPOSITION - 2ND STAGE LEFT ARM;  Surgeon: Elam Dutch, MD;  Location: [place] [facility];  Service: Vascular;  Laterality: Left;  . BLE amputation BK    . CARDIAC CATHETERIZATION    . cardiac stents    . CORONARY ANGIOPLASTY    . INSERTION OF DIALYSIS CATHETER Right 10/07/2013   Procedure: INSERTION OF DIALYSIS CATHETER;  Surgeon: Conrad [place], MD;  Location: [place];  Service: Vascular;  Laterality: Right;  . IRRIGATION AND DEBRIDEMENT ABSCESS    . SHUNTOGRAM Left 04/17/2014   Procedure: FISTULOGRAM;  Surgeon: Conrad [place], MD;  Location: [facility] CATH LAB;  Service: Cardiovascular;  Laterality: Left;     reports that she has been smoking cigarettes.  She has been smoking about 1.00 pack per day. she has never used smokeless tobacco. She reports that she does not drink alcohol or use drugs.  Allergies  Allergen Reactions  . Metformin And Related Diarrhea and Nausea And Vomiting  . Chantix [Varenicline]     "EVIL DREAMS"  . Ibuprofen Other (See Comments)    Cannot take because of low kidney function    Family History  Problem Relation Age of Onset  . Diabetes Mellitus II Mother   . Ovarian cancer Mother   . CAD Father   .  Diabetes Mellitus II Father      Prior to Admission medications   Medication Sig Start Date End Date Taking? Authorizing Provider  aspirin EC 81 MG tablet Take 1 tablet (81 mg total) by mouth daily. Patient taking differently: Take 81 mg by mouth See admin instructions. Daily on Sunday, Tuesday, Thursday and Saturday 11/08/16  Yes Herminio Commons, MD  clopidogrel (PLAVIX) 75 MG tablet Take 75 mg by mouth See admin instructions. At Bedtime on Sunday, Tuesday, Thursday and Saturday   Yes [provider]  insulin glargine (LANTUS) 100 UNIT/ML injection Inject 52 Units into the skin at bedtime.    Yes [provider]  insulin lispro (HUMALOG)  100 UNIT/ML injection Inject 12 Units into the skin 3 (three) times daily before meals.    Yes [provider]  levothyroxine (SYNTHROID, LEVOTHROID) 150 MCG tablet Take 150 mcg by mouth daily before breakfast.   Yes [provider]  lidocaine-prilocaine (EMLA) cream Apply 1 application topically as needed (for pain/ apply prior to dialysis).   Yes [provider]  metoprolol (TOPROL-XL) 200 MG 24 hr tablet Take 200 mg by mouth See admin instructions. Twice daily on Sunday, Tuesday, Thursday and Saturday   Yes [provider]  Omega-3 Fatty Acids (FISH OIL) 1000 MG CAPS Take 1,000 mg by mouth 3 (three) times daily.    Yes [provider]  Oxycodone HCl 10 MG TABS Take 10 mg by mouth every 6 (six) hours as needed (for pain).    Yes [provider]  promethazine (PHENERGAN) 25 MG tablet Take 25 mg by mouth every 6 (six) hours as needed for nausea or vomiting.   Yes [provider]  sevelamer carbonate (RENVELA) 800 MG tablet Take 800-1,600 mg by mouth See admin instructions. Take 1 tablet  (800 mg) with snacks and 2 tablets (1600 mg) with meals on Sunday, Tuesday, Thursday and Saturday   Yes [provider]  vancomycin (VANCOCIN) 1 GM/200ML SOLN Inject 200 mLs (1,000 mg total) into the vein every Monday, Wednesday, and Friday with hemodialysis. 09/23/14  Yes Velvet Bathe, MD    Physical Exam: Vitals:   11/09/17 0300 11/09/17 0330 11/09/17 0400 11/09/17 0430  BP:      Pulse: 69 70 65 65  Resp: 14 15 19  (!) 21  Temp:      TempSrc:      SpO2: 99% 96% 96% 97%    Constitutional: NAD, calm, comfortable Eyes: PERRL, lids and conjunctivae normal ENMT: Mucous membranes are moist. Posterior pharynx clear of any exudate or lesions.Normal dentition.  Neck: normal, supple, no masses, no thyromegaly Respiratory: clear to auscultation bilaterally, no wheezing, no crackles. Normal respiratory effort. No accessory muscle use.    Cardiovascular: Regular rate and rhythm, no murmurs / rubs / gallops. No extremity edema. 2+ pedal pulses. No carotid bruits.  Abdomen: no tenderness, no masses palpated. No hepatosplenomegaly. Bowel sounds positive.  Musculoskeletal: s/p B BKAs  Skin: no rashes, lesions, ulcers. No induration Neurologic: CN 2-12 grossly intact. Sensation intact, DTR normal. Strength 5/5 in all 4.  Psychiatric: Depressed mood, AAOx3   Labs on Admission: I have personally reviewed following labs and imaging studies  CBC: Recent Labs  Lab 11/09/17 0207  WBC 7.3  HGB 13.5  HCT 37.2  MCV 91.9  PLT 696   Basic Metabolic Panel: Recent Labs  Lab 11/09/17 0207  NA 137  K 3.1*  CL 97*  CO2 27  GLUCOSE 231*  BUN 16  CREATININE 2.68*  CALCIUM 8.4*   GFR: CrCl cannot be calculated (Unknown ideal weight.). Liver Function Tests: No results for input(s): AST, ALT, ALKPHOS, BILITOT, PROT, ALBUMIN in the last 168 hours. No results for input(s): LIPASE, AMYLASE in the last 168 hours. No results for input(s): AMMONIA in the last 168 hours. Coagulation Profile: No results for input(s): INR, PROTIME in the last 168 hours. Cardiac Enzymes: Recent Labs  Lab 11/09/17 0207  TROPONINI <0.03   BNP (last 3 results) No results for input(s): PROBNP in the last 8760 hours. HbA1C: No results for input(s): HGBA1C in the last 72 hours. CBG: Recent Labs  Lab 11/09/17 0430  GLUCAP 187*   Lipid Profile: No results for input(s): CHOL, HDL, LDLCALC, TRIG, CHOLHDL, LDLDIRECT in the last 72 hours. Thyroid Function Tests: No results for input(s): TSH, T4TOTAL, FREET4, T3FREE, THYROIDAB in the last 72 hours. Anemia Panel: No results for input(s): VITAMINB12, FOLATE, FERRITIN, TIBC, IRON, RETICCTPCT in the last 72 hours. Urine analysis:    Component Value Date/Time   COLORURINE YELLOW 07/29/2013 2331   APPEARANCEUR CLOUDY (A) 07/29/2013 2331   LABSPEC 1.011 07/29/2013 2331   PHURINE 5.5 07/29/2013 2331    GLUCOSEU 250 (A) 07/29/2013 2331   HGBUR TRACE (A) 07/29/2013 2331   BILIRUBINUR NEGATIVE 07/29/2013 2331   KETONESUR NEGATIVE 07/29/2013 2331   PROTEINUR 100 (A) 07/29/2013 2331   UROBILINOGEN 0.2 07/29/2013 2331   NITRITE NEGATIVE 07/29/2013 2331   LEUKOCYTESUR TRACE (A) 07/29/2013 2331    Radiological Exams on Admission: Dg Chest 2 View  Result Date: 11/09/2017 CLINICAL DATA:  Initial evaluation for acute mid chest pain. EXAM: CHEST  2 VIEW COMPARISON:  Prior radiograph from 01/22/2014. FINDINGS: Mild cardiomegaly, stable.  Mediastinal silhouette normal. Lungs mildly hypoinflated. Streaky left basilar opacity favored to reflect atelectasis. Mild diffuse interstitial prominence favored to be related to mild interstitial edema, although acute bronchiolitis/pneumonitis could also be considered. No frank alveolar edema. No pleural effusion. No pneumothorax. No acute osseus abnormality. IMPRESSION: 1. Stable cardiomegaly with mild diffuse interstitial prominence, favored to reflect mild pulmonary interstitial congestion. Bronchiolitis/atypical pneumonitis could be considered in the correct clinical setting. 2. Shallow lung inflation with superimposed mild bibasilar atelectasis. Electronically Signed   By: Jeannine Boga M.D.   On: 11/09/2017 02:43    EKG: Independently reviewed.  Assessment/Plan Principal Problem:   Chest pain, rule out acute myocardial infarction Active Problems:   IDDM (insulin dependent diabetes mellitus) (HCC)   HTN (hypertension)   End stage renal disease on dialysis ([place])    1. CP r/o - 1. CP obs pathway 2. Serial trops 3. Tele monitor 4. NPO 5. Cards eval in AM 2. ESRD - 1. Left message with nephrology for routine dialysis while inpatient 3. DM1 - 1. Patient takes 32U lantus most evenings 2. But takes 0U lantus if BGL below 120 (and apparently doesn't go in to DKA with this). 3. Didn't take lantus last evening 4. BGL currently 180 5. Will give 15u  lantus now and QHS 6. Sensitive SSI Q4H 4. HTN - continue toprol  DVT prophylaxis: heparin [place] Code Status: Full Family Communication: No family in room Disposition Plan: Home after admit Consults called: Msg sent to [facility] for cards eval in AM, left message with nephrology for routine IP dialysis Admission status: Place in obs   Bahja Bence, [place] [facility] Pager [phone]  If 7AM-7PM, please contact day team taking care of patient [email or web address] Password [facility]  11/09/2017, 4:38 AM

## 2017-11-09 NOTE — Progress Notes (Signed)
Angela Soto is a 47 y.o. female patient admitted from ED awake, alert - oriented  X 4 - no acute distress noted.  VSS - Blood pressure (!) 165/59, pulse 63, temperature 99 F (37.2 C), temperature source Oral, resp. rate 12, SpO2 95 %.    IV in place, occlusive dsg intact without redness.  Orientation to room, and floor completed with information packet given to patient/family.  Patient declined safety video at this time.  Admission INP armband ID verified with patient/family, and in place.   SR up x 2, fall assessment complete, with patient and family able to verbalize understanding of risk associated with falls, and verbalized understanding to call nsg before up out of bed.  Call light within reach, patient able to voice, and demonstrate understanding.  Skin, clean-dry- intact without evidence of bruising, or skin tears.   No evidence of skin break down noted on exam.     Will cont to eval and treat per MD orders.  Milas Hock, RN 11/09/2017 3:38 PM

## 2017-11-09 NOTE — ED Triage Notes (Signed)
Pt here via [facility] with c/o chest pain. States that it was a 6/10. Denies pain at this time. States that the pain started again right before her husband called 41. Denies nausea or sob currently. Took two 325mg  ASA before EMS arrived. Dialysis pt. Last Dialysis today (M,W, F)

## 2017-11-10 ENCOUNTER — Encounter (HOSPITAL_COMMUNITY): Payer: Self-pay | Admitting: Cardiovascular Disease

## 2017-11-10 DIAGNOSIS — E119 Type 2 diabetes mellitus without complications: Secondary | ICD-10-CM | POA: Diagnosis not present

## 2017-11-10 DIAGNOSIS — I10 Essential (primary) hypertension: Secondary | ICD-10-CM

## 2017-11-10 DIAGNOSIS — Z992 Dependence on renal dialysis: Secondary | ICD-10-CM

## 2017-11-10 DIAGNOSIS — Z794 Long term (current) use of insulin: Secondary | ICD-10-CM | POA: Diagnosis not present

## 2017-11-10 DIAGNOSIS — R079 Chest pain, unspecified: Secondary | ICD-10-CM | POA: Diagnosis not present

## 2017-11-10 DIAGNOSIS — I214 Non-ST elevation (NSTEMI) myocardial infarction: Secondary | ICD-10-CM | POA: Diagnosis not present

## 2017-11-10 DIAGNOSIS — N186 End stage renal disease: Secondary | ICD-10-CM | POA: Diagnosis not present

## 2017-11-10 LAB — BASIC METABOLIC PANEL
Anion gap: 13 (ref 5–15)
BUN: 30 mg/dL — ABNORMAL HIGH (ref 6–20)
CHLORIDE: 98 mmol/L — AB (ref 101–111)
CO2: 25 mmol/L (ref 22–32)
CREATININE: 3.74 mg/dL — AB (ref 0.44–1.00)
Calcium: 7.7 mg/dL — ABNORMAL LOW (ref 8.9–10.3)
GFR calc Af Amer: 16 mL/min — ABNORMAL LOW (ref >60)
GFR calc non Af Amer: 13 mL/min — ABNORMAL LOW (ref >60)
Glucose, Bld: 100 mg/dL — ABNORMAL HIGH (ref 65–99)
Potassium: 3.4 mmol/L — ABNORMAL LOW (ref 3.5–5.1)
SODIUM: 136 mmol/L (ref 135–145)

## 2017-11-10 LAB — GLUCOSE, CAPILLARY
GLUCOSE-CAPILLARY: 100 mg/dL — AB (ref 65–99)
Glucose-Capillary: 100 mg/dL — ABNORMAL HIGH (ref 65–99)
Glucose-Capillary: 121 mg/dL — ABNORMAL HIGH (ref 65–99)
Glucose-Capillary: 91 mg/dL (ref 65–99)

## 2017-11-10 LAB — MRSA PCR SCREENING: MRSA BY PCR: NEGATIVE

## 2017-11-10 LAB — LIPID PANEL
CHOLESTEROL: 185 mg/dL (ref 0–200)
HDL: 24 mg/dL — AB (ref >40)
LDL Cholesterol: 102 mg/dL — ABNORMAL HIGH (ref 0–99)
Total CHOL/HDL Ratio: 7.7 RATIO
Triglycerides: 295 mg/dL — ABNORMAL HIGH (ref <150)
VLDL: 59 mg/dL — AB (ref 0–40)

## 2017-11-10 LAB — TSH: TSH: 0.426 u[IU]/mL (ref 0.350–4.500)

## 2017-11-10 MED ORDER — ATORVASTATIN CALCIUM 80 MG PO TABS: 80.0000 mg | ORAL_TABLET | Freq: Every day | ORAL | 0 refills | Status: DC

## 2017-11-10 MED ORDER — ISOSORBIDE MONONITRATE ER 30 MG PO TB24: 30.0000 mg | ORAL_TABLET | Freq: Every day | ORAL | 0 refills | Status: DC

## 2017-11-10 MED ORDER — NICOTINE 14 MG/24HR TD PT24
14.0000 mg | MEDICATED_PATCH | Freq: Every day | TRANSDERMAL | Status: DC
  Administered 2017-11-10: 14 mg via TRANSDERMAL
  Filled 2017-11-10: qty 1

## 2017-11-10 MED ORDER — NICOTINE 14 MG/24HR TD PT24: 14.0000 mg | MEDICATED_PATCH | Freq: Every day | TRANSDERMAL | 0 refills | Status: DC

## 2017-11-10 MED ORDER — ISOSORBIDE MONONITRATE ER 30 MG PO TB24
30.0000 mg | ORAL_TABLET | Freq: Every day | ORAL | Status: DC
  Administered 2017-11-10: 30 mg via ORAL
  Filled 2017-11-10: qty 1

## 2017-11-10 MED ORDER — CLOPIDOGREL BISULFATE 75 MG PO TABS: 75.0000 mg | ORAL_TABLET | Freq: Every day | ORAL | Status: DC

## 2017-11-10 MED ORDER — ASPIRIN EC 81 MG PO TBEC
81.0000 mg | DELAYED_RELEASE_TABLET | Freq: Every day | ORAL | Status: DC
  Administered 2017-11-10: 81 mg via ORAL
  Filled 2017-11-10: qty 1

## 2017-11-10 MED FILL — Lidocaine HCl Local Inj 1%: INTRAMUSCULAR | Qty: 20 | Status: AC

## 2017-11-10 NOTE — Discharge Summary (Signed)
Physician Discharge Summary  Angela Soto NWG:[id number] DOB: 1971-08-27 DOA: 11/09/2017  PCP: Neale Burly, MD  Admit date: 11/09/2017 Discharge date: 11/10/2017  Time spent: 45 minutes  Recommendations for Outpatient Follow-up:  Patient will be discharged to home.  Patient will need to follow up with primary care provider within one week of discharge.  Continue dialysis as scheduled. Follow-up with cardiology in 1-2 weeks. Patient should continue medications as prescribed.  Patient should follow a heart healthy/scar modified diet.   Discharge Diagnoses:  Chest pain/Elevated troponin ESRD Essential Hypertension Diabetes mellitus, type I Tobacco abuse Hyperlipidemia Hypothyroidism  Discharge Condition: Stable  Diet recommendation: Heart healthy/Carb modified   Filed Weights   11/10/17 0506  Weight: 85.9 kg (189 lb 6 oz)    History of present illness:  On 11/09/2017 by Dr. Jennette Kettle Angela Soto is a 47 y.o. female with medical history significant of ESRD dialysis MWF, CAD, MI, 4 coronary stents, EF 41% with large inferior wall fixed defect on stress test in Jan 2018, DM1 since age 21.  Patient presents to the ED with c/o chest pain.  Left sided, no radiation, associated with SOB, no N/V.  Took 2x 324 ASA at home.  Not given meds by EMS as CP had resolved and was CP free at that time.  Hospital Course:  Chest pain/Elevated troponin -troponin 0.06 to 0.17 -Cardiology consulted and appreciated -Left heart catheterization 11/10/2017 showed two-vessel disease with complex CAD. No PCI was performed, medical management was recommended. Should not found to be candidate for CABG. -Echocardiogram 11/10/2017 EF 57-84%, grade 2 diastolic dysfunction -Continue aspirin, Plavix, statin, metoprolol -Currently patient denies any further chest pain or shortness of breath -Follow-up with Dr. Bronson Ing  ESRD -Patient dialyzes on MWF -She has called her dialysis center, and  will dialyze on 11/11/2017  Essential Hypertension -Continue metoprolol,  Diabetes mellitus, type I -Continue Lantus, Humalog  Tobacco abuse -discussed cessation -continue nicotine patch  Hyperlipidemia -Continue statin, Omega-3 -LDL 102  Hypothyroidism -TSH 0.426 -Continue synthroid  Procedures: Echocardiogram Left heart catheterization  Consultations: Cardiology  Discharge Exam: Vitals:   11/09/17 2330 11/10/17 0506  BP: (!) 132/57 (!) 157/67  Pulse: (!) 57 60  Resp: 16 (!) 21  Temp: 99.1 F (37.3 C) 98.6 F (37 C)  SpO2: 95% 95%   Patient nights for chest pain, shortness breath. Like to go home today. Will dialyze tomorrow. Currently denies abdominal pain, nausea vomiting, diarrhea, constipation.    General: Well developed, Chronically ill appearing, NAD  HEENT: NCAT, mucous membranes moist.  Cardiovascular: S1 S2 auscultated, RRR, +SEM  Respiratory: Diminished but clear. No wheezing   Abdomen: Soft, nontender, nondistended, + bowel sounds  Extremities: bilateral BKA  Neuro: AAOx3, nonfocal  Psych: Appropriate mood and affect  Discharge Instructions Discharge Instructions    Discharge instructions   Complete by:  As directed    Patient will be discharged to home.  Patient will need to follow up with primary care provider within one week of discharge.  Continue dialysis as scheduled. Follow-up with cardiology in 1-2 weeks. Patient should continue medications as prescribed.  Patient should follow a heart healthy/scar modified diet.     Allergies as of 11/10/2017      Reactions   Metformin And Related Diarrhea, Nausea And Vomiting   Chantix [varenicline]    "EVIL DREAMS"   Ibuprofen Other (See Comments)   Cannot take because of low kidney function      Medication List    TAKE  these medications   aspirin EC 81 MG tablet Take 1 tablet (81 mg total) by mouth daily. What changed:    when to take this  additional instructions     atorvastatin 80 MG tablet Commonly known as:  LIPITOR Take 1 tablet (80 mg total) by mouth daily at 6 PM.   clopidogrel 75 MG tablet Commonly known as:  PLAVIX Take 75 mg by mouth See admin instructions. At Bedtime on Sunday, Tuesday, Thursday and Saturday   Fish Oil 1000 MG Caps Take 1,000 mg by mouth 3 (three) times daily.   insulin glargine 100 UNIT/ML injection Commonly known as:  LANTUS Inject 52 Units into the skin at bedtime.   insulin lispro 100 UNIT/ML injection Commonly known as:  HUMALOG Inject 12 Units into the skin 3 (three) times daily before meals.   isosorbide mononitrate 30 MG 24 hr tablet Commonly known as:  IMDUR Take 1 tablet (30 mg total) by mouth daily.   levothyroxine 150 MCG tablet Commonly known as:  SYNTHROID, LEVOTHROID Take 150 mcg by mouth daily before breakfast.   lidocaine-prilocaine cream Commonly known as:  EMLA Apply 1 application topically as needed (for pain/ apply prior to dialysis).   metoprolol 200 MG 24 hr tablet Commonly known as:  TOPROL-XL Take 200 mg by mouth See admin instructions. Twice daily on Sunday, Tuesday, Thursday and Saturday   nicotine 14 mg/24hr patch Commonly known as:  NICODERM CQ - dosed in mg/24 hours Place 1 patch (14 mg total) onto the skin daily. Start taking on:  11/11/2017   Oxycodone HCl 10 MG Tabs Take 10 mg by mouth every 6 (six) hours as needed (for pain).   promethazine 25 MG tablet Commonly known as:  PHENERGAN Take 25 mg by mouth every 6 (six) hours as needed for nausea or vomiting.   sevelamer carbonate 800 MG tablet Commonly known as:  RENVELA Take 800-1,600 mg by mouth See admin instructions. Take 1 tablet  (800 mg) with snacks and 2 tablets (1600 mg) with meals on Sunday, Tuesday, Thursday and Saturday   vancomycin 1-5 GM/200ML-% Soln Commonly known as:  VANCOCIN Inject 200 mLs (1,000 mg total) into the vein every Monday, Wednesday, and Friday with hemodialysis.      Allergies   Allergen Reactions  . Metformin And Related Diarrhea and Nausea And Vomiting  . Chantix [Varenicline]     "EVIL DREAMS"  . Ibuprofen Other (See Comments)    Cannot take because of low kidney function   Follow-up Information    Hasanaj, Samul Dada, MD. Schedule an appointment as soon as possible for a visit in 1 week(s).   Specialty:  Internal Medicine Why:  Hospital follow up Contact information: [place] [address] [phone]        Herminio Commons, MD .   Specialty:  Cardiology Contact information: [place] [address] [phone]        Herminio Commons, MD. Schedule an appointment as soon as possible for a visit in 1 week(s).   Specialty:  Cardiology Why:  Hospital follow up Contact information: [place]  [address] [phone]            The results of significant diagnostics from this hospitalization (including imaging, microbiology, ancillary and laboratory) are listed below for reference.    Significant Diagnostic Studies: Dg Chest 2 View  Result Date: 11/09/2017 CLINICAL DATA:  Initial evaluation for acute mid chest pain.  EXAM: CHEST  2 VIEW COMPARISON:  Prior radiograph from 01/22/2014. FINDINGS: Mild cardiomegaly, stable.  Mediastinal silhouette normal. Lungs mildly hypoinflated. Streaky left basilar opacity favored to reflect atelectasis. Mild diffuse interstitial prominence favored to be related to mild interstitial edema, although acute bronchiolitis/pneumonitis could also be considered. No frank alveolar edema. No pleural effusion. No pneumothorax. No acute osseus abnormality. IMPRESSION: 1. Stable cardiomegaly with mild diffuse interstitial prominence, favored to reflect mild pulmonary interstitial congestion. Bronchiolitis/atypical pneumonitis could be considered in the correct clinical setting. 2. Shallow lung inflation with superimposed mild bibasilar atelectasis. Electronically Signed    By: Jeannine Boga M.D.   On: 11/09/2017 02:43    Microbiology: Recent Results (from the past 240 hour(s))  MRSA PCR Screening     Status: None   Collection Time: 11/09/17  6:58 PM  Result Value Ref Range Status   MRSA by PCR NEGATIVE NEGATIVE Final    Comment:        The GeneXpert MRSA Assay (FDA approved for NASAL specimens only), is one component of a comprehensive MRSA colonization surveillance program. It is not intended to diagnose MRSA infection nor to guide or monitor treatment for MRSA infections.      Labs: Basic Metabolic Panel: Recent Labs  Lab 11/09/17 0207 11/10/17 0527  NA 137 136  K 3.1* 3.4*  CL 97* 98*  CO2 27 25  GLUCOSE 231* 100*  BUN 16 30*  CREATININE 2.68* 3.74*  CALCIUM 8.4* 7.7*   Liver Function Tests: No results for input(s): AST, ALT, ALKPHOS, BILITOT, PROT, ALBUMIN in the last 168 hours. No results for input(s): LIPASE, AMYLASE in the last 168 hours. No results for input(s): AMMONIA in the last 168 hours. CBC: Recent Labs  Lab 11/09/17 0207  WBC 7.3  HGB 13.5  HCT 37.2  MCV 91.9  PLT 183   Cardiac Enzymes: Recent Labs  Lab 11/09/17 0207 11/09/17 0511 11/09/17 0819 11/09/17 1122  TROPONINI <0.03 0.06* 0.11* 0.17*   BNP: BNP (last 3 results) No results for input(s): BNP in the last 8760 hours.  ProBNP (last 3 results) No results for input(s): PROBNP in the last 8760 hours.  CBG: Recent Labs  Lab 11/09/17 2039 11/10/17 0012 11/10/17 0504 11/10/17 0758 11/10/17 1123  GLUCAP 139* 121* 100* 91 100*       Signed:  Levy Cedano  [facility] 11/10/2017, 12:43 PM

## 2017-11-10 NOTE — Progress Notes (Signed)
TR band removed from Right Radial site.  No immediate complications. Site level 0.  Tegaderm with gauze applied and pt was informed that this dsg should stay in place for 24hrs.  Wrist restrictions were also reviewed.  Pt in agreement.

## 2017-11-10 NOTE — Care Management Obs Status (Signed)
[facility] NOTIFICATION   Patient Details  Name: BURNIS KASER MRN: [id number] Date of Birth: [birth year]   Medicare Observation Status Notification Given:  Yes    Carles Collet, RN 11/10/2017, 10:27 AM

## 2017-11-10 NOTE — Progress Notes (Signed)
The patient has been given her discharge instructions along with a new medication list and what to take today. She has been educated on radial site care. She has new prescriptions to pick up and follow up appointments to make. She is discharging via car with a friend.   Saddie Benders RN

## 2017-11-10 NOTE — Care Management Note (Signed)
Case Management Note  Patient Details  Name: Angela Soto MRN: [id number] Date of Birth: 1971/07/09  Subjective/Objective: Pt presented for Chest Pain-Hx ESRD- MWF Schedule [facility] in [place]. Hx BKA has DME [facility] and [place]. PTA from home with the support of husband.                   Action/Plan: Pt declined any HH Needs- She states that her husband assists with everything. Pt states she has an appointment for HD Sat am with [facility] in [place]. Staff RN to call and verify appointment. No further needs from CM at this time.   Expected Discharge Date:                  Expected Discharge Plan:  Home/Self Care  In-House Referral:  NA  Discharge planning Services  CM Consult  Post Acute Care Choice:  NA Choice offered to:  NA  DME Arranged:  N/A DME Agency:  NA  HH Arranged:  NA HH Agency:  NA  Status of Service:  Completed, signed off  If discussed at [place] of Stay Meetings, dates discussed:    Additional Comments:  Bethena Roys, RN 11/10/2017, 12:38 PM

## 2017-11-10 NOTE — Progress Notes (Signed)
Progress Note  Patient Name: Angela Soto Date of Encounter: 11/10/2017  Primary Cardiologist: Kate Sable, MD   Subjective   No more CP, SOB, or diaphoresis. No dizziness or lightheadedness. She is interested in nicotine patch to help with smoking cessation. She feels hot and thinks she should be getting a higher dose of synthroid (thinks she takes 300 mcg daily at home). Needs to call her dialysis center to let them know if she is going to make it today. She takes aspirin and plavix every day (charted as only on non-HD days on home meds).  Inpatient Medications    Scheduled Meds: . aspirin EC  81 mg Oral Once per day on Sun Tue Thu Sat  . atorvastatin  80 mg Oral q1800  . clopidogrel  75 mg Oral Once per day on Sun Tue Thu Sat  . heparin  5,000 Units Subcutaneous Q8H  . insulin aspart  0-9 Units Subcutaneous Q4H  . insulin glargine  15 Units Subcutaneous QHS  . levothyroxine  150 mcg Oral QAC breakfast  . metoprolol  200 mg Oral 2 times per day on Sun Tue Thu Sat  . sevelamer carbonate  1,600 mg Oral 3 times per day on Sun Tue Thu Sat  . sodium chloride flush  3 mL Intravenous Q12H   Continuous Infusions: . sodium chloride     PRN Meds: sodium chloride, acetaminophen, ondansetron (ZOFRAN) IV, oxyCODONE, promethazine, sodium chloride flush   Vital Signs    Vitals:   11/09/17 1550 11/09/17 1554 11/09/17 2330 11/10/17 0506  BP:   (!) 132/57 (!) 157/67  Pulse: 62 64 (!) 57 60  Resp: 14 16 16  (!) 21  Temp:   99.1 F (37.3 C) 98.6 F (37 C)  TempSrc:   Oral Oral  SpO2: 96% 96% 95% 95%  Weight:    189 lb 6 oz (85.9 kg)    Intake/Output Summary (Last 24 hours) at 11/10/2017 0739 Last data filed at 11/09/2017 1832 Gross per 24 hour  Intake 240 ml  Output -  Net 240 ml   Filed Weights   11/10/17 0506  Weight: 189 lb 6 oz (85.9 kg)    Telemetry    SR/SB with IVCD and 1st degree AV block 50-60's - Personally Reviewed  ECG    No new tracings.  Physical  Exam   GEN: No acute distress.   Neck: No JVD Cardiac: RRR, 3/6 systolic murmur RUSB Respiratory: Diminished to auscultation bilaterally. GI: Soft, nontender, non-distended  MS: No edema, Bilateral BKA, LUE fistula Neuro:  Nonfocal  Psych: Normal affect   Labs    Chemistry Recent Labs  Lab 11/09/17 0207 11/10/17 0527  NA 137 136  K 3.1* 3.4*  CL 97* 98*  CO2 27 25  GLUCOSE 231* 100*  BUN 16 30*  CREATININE 2.68* 3.74*  CALCIUM 8.4* 7.7*  GFRNONAA 20* 13*  GFRAA 23* 16*  ANIONGAP 13 13     Hematology Recent Labs  Lab 11/09/17 0207  WBC 7.3  RBC 4.05  HGB 13.5  HCT 37.2  MCV 91.9  MCH 33.3  MCHC 36.3*  RDW 13.7  PLT 183    Cardiac Enzymes Recent Labs  Lab 11/09/17 0207 11/09/17 0511 11/09/17 0819 11/09/17 1122  TROPONINI <0.03 0.06* 0.11* 0.17*    Recent Labs  Lab 11/09/17 0215  TROPIPOC 0.01     BNPNo results for input(s): BNP, PROBNP in the last 168 hours.   DDimer No results for input(s): DDIMER in  the last 168 hours.   Radiology    Dg Chest 2 View  Result Date: 11/09/2017 CLINICAL DATA:  Initial evaluation for acute mid chest pain. EXAM: CHEST  2 VIEW COMPARISON:  Prior radiograph from 01/22/2014. FINDINGS: Mild cardiomegaly, stable.  Mediastinal silhouette normal. Lungs mildly hypoinflated. Streaky left basilar opacity favored to reflect atelectasis. Mild diffuse interstitial prominence favored to be related to mild interstitial edema, although acute bronchiolitis/pneumonitis could also be considered. No frank alveolar edema. No pleural effusion. No pneumothorax. No acute osseus abnormality. IMPRESSION: 1. Stable cardiomegaly with mild diffuse interstitial prominence, favored to reflect mild pulmonary interstitial congestion. Bronchiolitis/atypical pneumonitis could be considered in the correct clinical setting. 2. Shallow lung inflation with superimposed mild bibasilar atelectasis. Electronically Signed   By: Jeannine Boga M.D.   On:  11/09/2017 02:43    Cardiac Studies   Echo 11/10/2017: - Left ventricle: The cavity size was normal. Wall thickness was   increased in a pattern of mild LVH. Systolic function was normal.   The estimated ejection fraction was in the range of 50% to 55%.   There is hypokinesis of the inferolateral myocardium. Features   are consistent with a pseudonormal left ventricular filling   pattern, with concomitant abnormal relaxation and increased   filling pressure (grade 2 diastolic dysfunction). Doppler   parameters are consistent with high ventricular filling pressure. - Mitral valve: Calcified annulus. - Left atrium: The atrium was severely dilated.  LHC 11/10/2017: 1. Severe double vessel CAD 2. The LAD is a large caliber vessel that courses to the apex. The mid vessel has a long segment of moderate, calcific stenosis. This does not appear to be flow limiting. The first diagonal is a small caliber vessel with moderate ostial stenosis. The second diagonal branch is a small caliber vessel with mild plaque.  3. The Circumflex is a moderate caliber, heavily calcified vessel from the ostium down into the obtuse marginal branches. There is moderately severe, heavily calcified stenosis from the proximal vessel down into the most distal obtuse marginal branch. The obtuse marginal branch is completely occluded. It appears that the distal segment of this branch fills from bridging collaterals. The vessel is small in caliber after it reconstitutes.  4. The RCA is a large dominant vessel with 100% occlusion just beyond the ostium. The distal vessel and distal branches are seen to fill from left to right collaterals supplied by septal perforators.  5. LV systolic function is overall preserved with segmental wall motion abnormality. LVEF estimated around 45-50%.   Patient Profile     Angela Soto is a 47 y.o. female with a hx of ESRD on HD MWF, thyroid ca s/p thyroidectomy, CAD s/p PCI x 3-4 (1998 in [place]), current tobacco user (1 ppd), PVD with bilateral BKA, nonalcoholic steatohepatitis, MS, and type 1 DM   She was admitted on 1/24 after an episode of CP with SOB and diaphoresis.   Assessment & Plan    1. Chest pain with Hx of CAD s/p PCI (1998) - LHC 11/10/17 showed severe 2 vessel disease with complex CAD. No PCI performed. Not a candidate for CABG. Recommendations to treat medically and tobacco cessation - Echo 11/10/17: EF 50-55% with grade 2 DD - Continue ASA, plavix, atorvastatin, and Toprol XL. (Toprol only on non-HD days) - No further CP, SOB, or diaphoresis.  - Tobacco cessation as below  2. ESRD - She is oliguric - HD MWF with Dr. Juanda Crumble. Should get dialysis today. She needs  to let her dialysis center know if she will be DC'd/will need HD there today - Creatinine 3.74, K 3.4  3. HTN - Continue Toprol XL 200 mg BID (on non-HD days) - SBP elevated 130-150's. Some lows overnight, but taken on thigh (with bilat BKA) while TR band on - Consider adding imdur for BP control. She has previously been on hydral and amlodipine.  4. Tobacco use - Encouraged cessation - Ordered nicotine patch  5. Hyperlipidemia - LDL 102, HDL 24. Started on atorvastatin 80 yesterday.  6. Hypothyroidism - Pt believes she takes 300 mcg synthroid at home, not 150 - Will add TSH level to am labs and defer to primary for dosing     For questions or updates, please contact [facility] Please consult [email or web address] for contact info under Cardiology/STEMI.      Signed, Georgiana Shore, NP  11/10/2017, 7:39 AM    History and all data above reviewed.  Patient examined.  I agree with the findings as above.  The patient denies any acute chest pain symptoms.   The patient exam reveals COR:RRR  ,  Lungs: Clear  ,  Abd: Positive bowel sounds, no rebound no guarding, Ext  Right wrist without bruising or bleeding  .  All available labs, radiology testing, previous records reviewed. Agree with documented  assessment and plan. CAD:  Medical management.  She is not a PCI or surgical candidate (given the severe PVD).  She needs risk reduction and she might be inclined to quit smoking.  She previously tried Imdur and did not think that she tolerated this.  However, she is willing to try again.   Jeneen Rinks Isella Slatten  11:15 AM  11/10/2017

## 2017-11-11 DIAGNOSIS — D509 Iron deficiency anemia, unspecified: Secondary | ICD-10-CM | POA: Diagnosis not present

## 2017-11-11 DIAGNOSIS — Z992 Dependence on renal dialysis: Secondary | ICD-10-CM | POA: Diagnosis not present

## 2017-11-11 DIAGNOSIS — N186 End stage renal disease: Secondary | ICD-10-CM | POA: Diagnosis not present

## 2017-11-13 DIAGNOSIS — N186 End stage renal disease: Secondary | ICD-10-CM | POA: Diagnosis not present

## 2017-11-13 DIAGNOSIS — D509 Iron deficiency anemia, unspecified: Secondary | ICD-10-CM | POA: Diagnosis not present

## 2017-11-13 DIAGNOSIS — Z992 Dependence on renal dialysis: Secondary | ICD-10-CM | POA: Diagnosis not present

## 2017-11-14 ENCOUNTER — Ambulatory Visit (HOSPITAL_COMMUNITY)
Admission: RE | Admit: 2017-11-14 | Discharge: 2017-11-14 | Disposition: A | Discharge status: Home / Self Care | Payer: Medicare Other | Source: Ambulatory Visit | Attending: Neurology | Admitting: Neurology

## 2017-11-14 DIAGNOSIS — R4182 Altered mental status, unspecified: Secondary | ICD-10-CM | POA: Diagnosis not present

## 2017-11-14 DIAGNOSIS — J320 Chronic maxillary sinusitis: Secondary | ICD-10-CM | POA: Insufficient documentation

## 2017-11-14 DIAGNOSIS — H748X2 Other specified disorders of left middle ear and mastoid: Secondary | ICD-10-CM | POA: Insufficient documentation

## 2017-11-14 DIAGNOSIS — J440 Chronic obstructive pulmonary disease with acute lower respiratory infection: Secondary | ICD-10-CM | POA: Diagnosis not present

## 2017-11-14 DIAGNOSIS — N186 End stage renal disease: Secondary | ICD-10-CM | POA: Diagnosis not present

## 2017-11-14 DIAGNOSIS — G35 Multiple sclerosis: Secondary | ICD-10-CM | POA: Diagnosis not present

## 2017-11-14 DIAGNOSIS — E119 Type 2 diabetes mellitus without complications: Secondary | ICD-10-CM | POA: Diagnosis not present

## 2017-11-14 DIAGNOSIS — I10 Essential (primary) hypertension: Secondary | ICD-10-CM | POA: Diagnosis not present

## 2017-11-15 DIAGNOSIS — Z992 Dependence on renal dialysis: Secondary | ICD-10-CM | POA: Diagnosis not present

## 2017-11-15 DIAGNOSIS — D509 Iron deficiency anemia, unspecified: Secondary | ICD-10-CM | POA: Diagnosis not present

## 2017-11-15 DIAGNOSIS — N186 End stage renal disease: Secondary | ICD-10-CM | POA: Diagnosis not present

## 2017-11-16 DIAGNOSIS — Z992 Dependence on renal dialysis: Secondary | ICD-10-CM | POA: Diagnosis not present

## 2017-11-16 DIAGNOSIS — N186 End stage renal disease: Secondary | ICD-10-CM | POA: Diagnosis not present

## 2017-11-17 DIAGNOSIS — D509 Iron deficiency anemia, unspecified: Secondary | ICD-10-CM | POA: Diagnosis not present

## 2017-11-17 DIAGNOSIS — Z992 Dependence on renal dialysis: Secondary | ICD-10-CM | POA: Diagnosis not present

## 2017-11-17 DIAGNOSIS — N186 End stage renal disease: Secondary | ICD-10-CM | POA: Diagnosis not present

## 2017-11-20 DIAGNOSIS — D509 Iron deficiency anemia, unspecified: Secondary | ICD-10-CM | POA: Diagnosis not present

## 2017-11-20 DIAGNOSIS — Z992 Dependence on renal dialysis: Secondary | ICD-10-CM | POA: Diagnosis not present

## 2017-11-20 DIAGNOSIS — N186 End stage renal disease: Secondary | ICD-10-CM | POA: Diagnosis not present

## 2017-11-22 DIAGNOSIS — N186 End stage renal disease: Secondary | ICD-10-CM | POA: Diagnosis not present

## 2017-11-22 DIAGNOSIS — Z992 Dependence on renal dialysis: Secondary | ICD-10-CM | POA: Diagnosis not present

## 2017-11-22 DIAGNOSIS — D509 Iron deficiency anemia, unspecified: Secondary | ICD-10-CM | POA: Diagnosis not present

## 2017-11-23 DIAGNOSIS — H3582 Retinal ischemia: Secondary | ICD-10-CM | POA: Diagnosis not present

## 2017-11-23 DIAGNOSIS — E113593 Type 2 diabetes mellitus with proliferative diabetic retinopathy without macular edema, bilateral: Secondary | ICD-10-CM | POA: Diagnosis not present

## 2017-11-23 DIAGNOSIS — H211X2 Other vascular disorders of iris and ciliary body, left eye: Secondary | ICD-10-CM | POA: Diagnosis not present

## 2017-11-23 DIAGNOSIS — R441 Visual hallucinations: Secondary | ICD-10-CM | POA: Diagnosis not present

## 2017-11-25 DIAGNOSIS — D509 Iron deficiency anemia, unspecified: Secondary | ICD-10-CM | POA: Diagnosis not present

## 2017-11-25 DIAGNOSIS — N186 End stage renal disease: Secondary | ICD-10-CM | POA: Diagnosis not present

## 2017-11-25 DIAGNOSIS — Z992 Dependence on renal dialysis: Secondary | ICD-10-CM | POA: Diagnosis not present

## 2017-11-27 DIAGNOSIS — D509 Iron deficiency anemia, unspecified: Secondary | ICD-10-CM | POA: Diagnosis not present

## 2017-11-27 DIAGNOSIS — N186 End stage renal disease: Secondary | ICD-10-CM | POA: Diagnosis not present

## 2017-11-27 DIAGNOSIS — Z992 Dependence on renal dialysis: Secondary | ICD-10-CM | POA: Diagnosis not present

## 2017-11-29 DIAGNOSIS — Z992 Dependence on renal dialysis: Secondary | ICD-10-CM | POA: Diagnosis not present

## 2017-11-29 DIAGNOSIS — D509 Iron deficiency anemia, unspecified: Secondary | ICD-10-CM | POA: Diagnosis not present

## 2017-11-29 DIAGNOSIS — N186 End stage renal disease: Secondary | ICD-10-CM | POA: Diagnosis not present

## 2017-12-01 DIAGNOSIS — N186 End stage renal disease: Secondary | ICD-10-CM | POA: Diagnosis not present

## 2017-12-01 DIAGNOSIS — Z992 Dependence on renal dialysis: Secondary | ICD-10-CM | POA: Diagnosis not present

## 2017-12-01 DIAGNOSIS — D509 Iron deficiency anemia, unspecified: Secondary | ICD-10-CM | POA: Diagnosis not present

## 2017-12-04 DIAGNOSIS — D509 Iron deficiency anemia, unspecified: Secondary | ICD-10-CM | POA: Diagnosis not present

## 2017-12-04 DIAGNOSIS — N186 End stage renal disease: Secondary | ICD-10-CM | POA: Diagnosis not present

## 2017-12-04 DIAGNOSIS — Z992 Dependence on renal dialysis: Secondary | ICD-10-CM | POA: Diagnosis not present

## 2017-12-05 ENCOUNTER — Encounter: Payer: Self-pay | Admitting: Student

## 2017-12-05 ENCOUNTER — Ambulatory Visit (INDEPENDENT_AMBULATORY_CARE_PROVIDER_SITE_OTHER): Payer: Medicare Other | Admitting: Student

## 2017-12-05 VITALS — BP 102/60 | HR 60 | Ht <= 58 in | Wt 189.2 lb

## 2017-12-05 DIAGNOSIS — E785 Hyperlipidemia, unspecified: Secondary | ICD-10-CM | POA: Diagnosis not present

## 2017-12-05 DIAGNOSIS — I10 Essential (primary) hypertension: Secondary | ICD-10-CM | POA: Diagnosis not present

## 2017-12-05 DIAGNOSIS — N186 End stage renal disease: Secondary | ICD-10-CM

## 2017-12-05 DIAGNOSIS — Z992 Dependence on renal dialysis: Secondary | ICD-10-CM | POA: Diagnosis not present

## 2017-12-05 DIAGNOSIS — I25118 Atherosclerotic heart disease of native coronary artery with other forms of angina pectoris: Secondary | ICD-10-CM

## 2017-12-05 DIAGNOSIS — Z720 Tobacco use: Secondary | ICD-10-CM | POA: Diagnosis not present

## 2017-12-05 MED ORDER — ROSUVASTATIN CALCIUM 10 MG PO TABS: 10.0000 mg | ORAL_TABLET | Freq: Every day | ORAL | 3 refills | Status: DC

## 2017-12-05 MED ORDER — NICOTINE 14 MG/24HR TD PT24: 14.0000 mg | MEDICATED_PATCH | Freq: Every day | TRANSDERMAL | 0 refills | Status: DC

## 2017-12-05 NOTE — Progress Notes (Signed)
Cardiology Office Note    Date:  12/05/2017   ID:  URSULA DERMODY, DOB June 09, 1971, MRN [id number]  PCP:  Neale Burly, MD  Cardiologist: Kate Sable, MD    Chief Complaint  Patient presents with  . Hospitalization Follow-up    History of Present Illness:    MAMTA RIMMER is a 47 y.o. female with past medical history of CAD (s/p prior PCI in 1998), ESRD (on HD - MWF), HTN, HLD, Hypothyroidism, IDDM, PVD (s/p bilateral BKA) and tobacco use who presents to the office today for hospital follow-up.   She was recently admitted to [facility] on 11/09/2017 for evaluation of left-sided chest pain with associated dyspnea and diaphoresis which started the previous evening. Troponin values were found to be slightly elevated and peaked at 0.17 and her EKG showed inferolateral TWI. Options were reviewed and a cardiac catheterization was recommended for definitive evaluation. This was performed on 11/09/2017 and showed severe two-vessel CAD with heavy calcification along the LCx and 100% CTO of RCA with left to right collaterals noted. There were no good options for revascularization and she was not felt to be a candidate for CABG given her comorbid conditions including end-stage renal disease and the lack of graft conduit given bilateral amputations. Continued medical management was recommended. She ws continued on ASA, Plavix, Atorvastatin, and Toprol-XL with Imdur 30mg  daily being added to her medication regimen.   In talking with the patient today, she denies any repeat episodes of chest discomfort. Has noted episodes of pain along her shoulders bilaterally which has been relieved with her as needed pain medication. She is wheelchair-bound at baseline but has not noticed any changes in her breathing. No recent orthopnea, PND, or lower extremity edema.  Denies any recent complications during her hemodialysis sessions. She does not take Toprol-XL or Imdur on HD days.   Has been trying to quit  smoking and switched to vaping. Is interested in nicotine patches if covered by her insurance.    Past Medical History:  Diagnosis Date  . Coronary artery disease    a. s/p prior PCI in 1998 b. 10/2017: cath showing severe two-vessel CAD with heavy calcification along the LCx and 100% CTO of RCA with left to right collaterals noted. No good options for PCI. Medical management recommended.   . Environmental allergies    uses inhalers  . ESRD (end stage renal disease) on dialysis [facility])    "started dialysis on 10/08/13; MWF; [facility]; [place], [place]"  . GERD (gastroesophageal reflux disease)   . ERDEYCXK(481.8)    "weekly" (11/09/2017)  . High cholesterol   . Hypertension   . Hypothyroidism   . Multiple sclerosis ([place])    just diagnosed 2014  . Myocardial infarction ([place]) 1998  . Nonalcoholic steatohepatitis ([facility])   . Peripheral vascular disease ([place])    aortic artery occlusion  . PONV (postoperative nausea and vomiting)   . Thyroid cancer ([place])   . Type 1 diabetes mellitus ([place])   . Umbilical hernia     Past Surgical History:  Procedure Laterality Date  . ABDOMINAL SURGERY  2006   aortic artery occluded, had to "clean it out"  . AMPUTATION Left 09/22/2014   Procedure: AMPUTATION LEFT FOURTH FINGER;  Surgeon: Dayna Barker, MD;  Location: [place];  Service: Plastics;  Laterality: Left;  . AV FISTULA PLACEMENT Left 09/24/2013   Procedure: LEFT UPPER EXTREMITY ARTERIOVENOUS (AV) FISTULA CREATION BRACHIAL/CEPHALIC;  Surgeon: Elam Dutch, MD;  Location: [place];  Service: Vascular;  Laterality: Left;  . BASCILIC VEIN TRANSPOSITION Left 02/18/2014   Procedure: BASILIC VEIN TRANSPOSITION - 2ND STAGE LEFT ARM;  Surgeon: Elam Dutch, MD;  Location: [place];  Service: Vascular;  Laterality: Left;  . CARDIAC CATHETERIZATION     "numerous"  . CARDIAC CATHETERIZATION  11/09/2017  . CORONARY ANGIOPLASTY    . CORONARY ANGIOPLASTY WITH STENT PLACEMENT     "think I have a total of 3 stents"  (11/09/2017)  . INSERTION OF DIALYSIS CATHETER Right 10/07/2013   Procedure: INSERTION OF DIALYSIS CATHETER;  Surgeon: Conrad [place], MD;  Location: [place];  Service: Vascular;  Laterality: Right;  . IRRIGATION AND DEBRIDEMENT ABSCESS Right   . LEFT HEART CATH AND CORONARY ANGIOGRAPHY N/A 11/09/2017   Procedure: LEFT HEART CATH AND CORONARY ANGIOGRAPHY;  Surgeon: Burnell Blanks, MD;  Location: [place] CV LAB;  Service: Cardiovascular;  Laterality: N/A;  . LEG AMPUTATION BELOW KNEE Bilateral 2008-2010   left-right  . PARATHYROIDECTOMY    . SHUNTOGRAM Left 04/17/2014   Procedure: FISTULOGRAM;  Surgeon: Conrad [place], MD;  Location: [facility] CATH LAB;  Service: Cardiovascular;  Laterality: Left;  . THYROIDECTOMY      Current Medications: Outpatient Medications Prior to Visit  Medication Sig Dispense Refill  . aspirin EC 81 MG tablet Take 1 tablet (81 mg total) by mouth daily. (Patient taking differently: Take 81 mg by mouth See admin instructions. Daily on Sunday, Tuesday, Thursday and Saturday)    . clopidogrel (PLAVIX) 75 MG tablet Take 75 mg by mouth See admin instructions. At Bedtime on Sunday, Tuesday, Thursday and Saturday    . insulin glargine (LANTUS) 100 UNIT/ML injection Inject 52 Units into the skin at bedtime.     . insulin lispro (HUMALOG) 100 UNIT/ML injection Inject 12 Units into the skin 3 (three) times daily before meals.     . isosorbide mononitrate (IMDUR) 30 MG 24 hr tablet Take 1 tablet (30 mg total) by mouth daily. 30 tablet 0  . levothyroxine (SYNTHROID, LEVOTHROID) 150 MCG tablet Take 150 mcg by mouth daily before breakfast.    . lidocaine-prilocaine (EMLA) cream Apply 1 application topically as needed (for pain/ apply prior to dialysis).    . metoprolol (TOPROL-XL) 200 MG 24 hr tablet Take 200 mg by mouth See admin instructions. Twice daily on Sunday, Tuesday, Thursday and Saturday    . Omega-3 Fatty Acids (FISH OIL) 1000 MG CAPS Take 1,000 mg by mouth 3 (three) times  daily.     . Oxycodone HCl 10 MG TABS Take 10 mg by mouth every 6 (six) hours as needed (for pain).     . promethazine (PHENERGAN) 25 MG tablet Take 25 mg by mouth every 6 (six) hours as needed for nausea or vomiting.    . sevelamer carbonate (RENVELA) 800 MG tablet Take 800-1,600 mg by mouth See admin instructions. Take 1 tablet  (800 mg) with snacks and 2 tablets (1600 mg) with meals on Sunday, Tuesday, Thursday and Saturday    . vancomycin (VANCOCIN) 1 GM/200ML SOLN Inject 200 mLs (1,000 mg total) into the vein every Monday, Wednesday, and Friday with hemodialysis. 4000 mL   . nicotine (NICODERM CQ - DOSED IN MG/24 HOURS) 14 mg/24hr patch Place 1 patch (14 mg total) onto the skin daily. 28 patch 0  . atorvastatin (LIPITOR) 80 MG tablet Take 1 tablet (80 mg total) by mouth daily at 6 PM. 30 tablet 0   No facility-administered medications prior to visit.  Allergies:   Metformin and related; Chantix [varenicline]; and Ibuprofen   Social History   Socioeconomic History  . Marital status: Married    Spouse name: None  . Number of children: None  . Years of education: None  . Highest education level: None  Social Needs  . Financial resource strain: None  . Food insecurity - worry: None  . Food insecurity - inability: None  . Transportation needs - medical: None  . Transportation needs - non-medical: None  Occupational History  . None  Tobacco Use  . Smoking status: Former Smoker    Packs/day: 1.00    Years: 32.00    Pack years: 32.00    Types: Cigarettes    Last attempt to quit: 11/09/2017    Years since quitting: 0.0  . Smokeless tobacco: Never Used  Substance and Sexual Activity  . Alcohol use: No  . Drug use: No  . Sexual activity: No  Other Topics Concern  . None  Social History Narrative  . None     Family History:  The patient's family history includes CAD (age of onset: 29) in her father; Diabetes Mellitus II in her father and mother; Ovarian cancer in her  mother.   Review of Systems:   Please see the history of present illness.     General:  No chills, fever, night sweats or weight changes. Positive for bilateral shoulder pain.  Cardiovascular:  No chest pain, dyspnea on exertion, edema, orthopnea, palpitations, paroxysmal nocturnal dyspnea. Dermatological: No rash, lesions/masses Respiratory: No cough, dyspnea Urologic: No hematuria, dysuria Abdominal:   No nausea, vomiting, diarrhea, bright red blood per rectum, melena, or hematemesis Neurologic:  No visual changes, wkns, changes in mental status. All other systems reviewed and are otherwise negative except as noted above.   Physical Exam:    VS:  BP 102/60   Pulse 60   Ht 4\' 3"  (1.295 m)   Wt 189 lb 3.2 oz (85.8 kg) Comment: per pt  LMP 05/24/2013   SpO2 98%   BMI 51.14 kg/m    General: Well developed, well nourished Caucasian female appearing in no acute distress. Sitting in wheelchair.  Head: Normocephalic, atraumatic, sclera non-icteric, no xanthomas, nares are without discharge.  Neck: No carotid bruits. JVD not elevated.  Lungs: Respirations regular and unlabored, without wheezes or rales.  Heart: Regular rate and rhythm. No S3 or S4.  No murmur, no rubs, or gallops appreciated. Abdomen: Soft, non-tender, non-distended with normoactive bowel sounds. No hepatomegaly. No rebound/guarding. No obvious abdominal masses. Msk:  Strength and tone appear normal for age. No joint deformities or effusions. Extremities: No clubbing or cyanosis. Bilateral BKA. Radial cath site stable without ecchymosis or evidence of a hematoma.  Neuro: Alert and oriented X 3. Moves all extremities spontaneously. No focal deficits noted. Psych:  Responds to questions appropriately with a normal affect. Skin: No rashes or lesions noted  Wt Readings from Last 3 Encounters:  12/05/17 189 lb 3.2 oz (85.8 kg)  11/10/17 189 lb 6 oz (85.9 kg)  04/11/17 188 lb (85.3 kg)     Studies/Labs Reviewed:    EKG:  EKG is not ordered today.    Recent Labs: 11/09/2017: Hemoglobin 13.5; Platelets 183 11/10/2017: BUN 30; Creatinine, Ser 3.74; Potassium 3.4; Sodium 136; TSH 0.426   Lipid Panel    Component Value Date/Time   CHOL 185 11/10/2017 0527   TRIG 295 (H) 11/10/2017 0527   HDL 24 (L) 11/10/2017 0527   CHOLHDL 7.7 11/10/2017 0527  VLDL 59 (H) 11/10/2017 0527   LDLCALC 102 (H) 11/10/2017 0527    Additional studies/ records that were reviewed today include:   Echocardiogram: 11/09/2017 Study Conclusions  - Left ventricle: The cavity size was normal. Wall thickness was   increased in a pattern of mild LVH. Systolic function was normal.   The estimated ejection fraction was in the range of 50% to 55%.   There is hypokinesis of the inferolateral myocardium. Features   are consistent with a pseudonormal left ventricular filling   pattern, with concomitant abnormal relaxation and increased   filling pressure (grade 2 diastolic dysfunction). Doppler   parameters are consistent with high ventricular filling pressure. - Mitral valve: Calcified annulus. - Left atrium: The atrium was severely dilated.  Impressions:  - Hypokinesis of the inferolateral wall with overall low normal LV   systolic function; mild LVH; moderate diastolic dysfunction;   severe LAE.  Cardiac Catheterization: 11/09/2017  Mid RCA to Dist RCA lesion is 100% stenosed.   1. Severe double vessel CAD 2. The LAD is a large caliber vessel that courses to the apex. The mid vessel has a long segment of moderate, calcific stenosis. This does not appear to be flow limiting. The first diagonal is a small caliber vessel with moderate ostial stenosis. The second diagonal branch is a small caliber vessel with mild plaque.  3. The Circumflex is a moderate caliber, heavily calcified vessel from the ostium down into the obtuse marginal branches. There is moderately severe, heavily calcified stenosis from the proximal vessel  down into the most distal obtuse marginal branch. The obtuse marginal branch is completely occluded. It appears that the distal segment of this branch fills from bridging collaterals. The vessel is small in caliber after it reconstitutes.  4. The RCA is a large dominant vessel with 100% occlusion just beyond the ostium. The distal vessel and distal branches are seen to fill from left to right collaterals supplied by septal perforators.  5. LV systolic function is overall preserved with segmental wall motion abnormality. LVEF estimated around 45-50%.   Recommendations: She has complex CAD with no good options for revascularization. Her RCA is chronically occluded and fills from collaterals. The entire Circumflex is diffusely diseased and heavily calcified with a distal chronic total occlusion of the OM branch. I would not approach this vessel with PCI. If PCI of the Circumflex were considered, we would have to perform atherectomy of the entire vessel and likely could not cross the distal CTO. The entire vessel would need to be stented and the risk of restenosis would be very high in this patient with diabetes and ongoing tobacco abuse. She is not a candidate for CABG given her comorbid conditions including ESRD and the lack of graft conduit given bilateral lower extremity amputations. At this time, I would attempt to control her symptoms with medical therapy. She should consider stopping smoking as well.   Assessment:    1. Coronary artery disease involving native coronary artery of native heart with other form of angina pectoris ([place])   2. Essential hypertension   3. Hyperlipidemia LDL goal <70   4. ESRD (end stage renal disease) on dialysis ([place])   5. Tobacco use      Plan:   In order of problems listed above:  1. CAD - s/p prior PCI in 1998 while living in [place]. She was recently admitted for worsening chest pain and a repeat cardiac catheterization was performed and showed severe two-vessel CAD  with heavy  calcification along the LCx and 100% CTO of RCA with left to right collaterals noted. There were no good options for revascularization and she was not felt to be a candidate for CABG given her comorbid conditions, with continual medical management recommended.  - she denies any repeat episodes of chest pain. Breathing has been at baseline.  - will continue on ASA, Plavix, Atorvastatin, Toprol-XL and Imdur 30mg  daily. Can consider further titration of Imdur in the future if symptoms represent but hesitant to increase dosing at this time with her noted episodes of hypotension.  2. HTN - BP is soft at 102/60 during today's visit. - continue Toprol-XL and Imdur (holds medications on HD days).  3. HLD - recent FLP showed LDL elevated to 102. Previously intolerant to Atorvastatin secondary to nausea and myalgias but has not tried other statins. Will initiate Crestor 10mg  daily with plans for repeat FLP and LFT's in 3 months. If not at goal and tolerating well, would further titrate.   4. ESRD - HD - MWF schedule. Nephrologist is Dr. Lowanda Foster.   5. Tobacco Use - she has quit smoking cigarettes and switched to vaping. Is aware the products still contain nicotine but is trying to wean down the percentage. Continued reduction with eventual cessation advised.    Medication Adjustments/Labs and Tests Ordered: Current medicines are reviewed at length with the patient today.  Concerns regarding medicines are outlined above.  Medication changes, Labs and Tests ordered today are listed in the Patient Instructions below. Patient Instructions  Medication Instructions:  Your physician has recommended you make the following change in your medication:  Start Taking Crestor 10 mg Daily    Labwork: NONE   Testing/Procedures: NONE   Follow-Up: Your physician recommends that you schedule a follow-up appointment in: 2-3 Months with Dr. Bronson Ing.   Any Other Special Instructions Will Be Listed  Below (If Applicable).  If you need a refill on your cardiac medications before your next appointment, please call your pharmacy. Thank you for choosing [facility]!    Signed, Erma Heritage, PA-C  12/05/2017 7:42 PM    [place] [address] [place], [place] [address] Phone: [phone]

## 2017-12-05 NOTE — Patient Instructions (Signed)
Medication Instructions:  Your physician has recommended you make the following change in your medication:  Start Taking Crestor 10 mg Daily    Labwork: NONE   Testing/Procedures: NONE   Follow-Up: Your physician recommends that you schedule a follow-up appointment in: 2-3 Months with Dr. Bronson Ing.    Any Other Special Instructions Will Be Listed Below (If Applicable).     If you need a refill on your cardiac medications before your next appointment, please call your pharmacy. Thank you for choosing [facility]!

## 2017-12-06 DIAGNOSIS — Z992 Dependence on renal dialysis: Secondary | ICD-10-CM | POA: Diagnosis not present

## 2017-12-06 DIAGNOSIS — D509 Iron deficiency anemia, unspecified: Secondary | ICD-10-CM | POA: Diagnosis not present

## 2017-12-06 DIAGNOSIS — N186 End stage renal disease: Secondary | ICD-10-CM | POA: Diagnosis not present

## 2017-12-07 DIAGNOSIS — M25559 Pain in unspecified hip: Secondary | ICD-10-CM | POA: Diagnosis not present

## 2017-12-07 DIAGNOSIS — M47816 Spondylosis without myelopathy or radiculopathy, lumbar region: Secondary | ICD-10-CM | POA: Diagnosis not present

## 2017-12-07 DIAGNOSIS — M169 Osteoarthritis of hip, unspecified: Secondary | ICD-10-CM | POA: Diagnosis not present

## 2017-12-07 DIAGNOSIS — G894 Chronic pain syndrome: Secondary | ICD-10-CM | POA: Diagnosis not present

## 2017-12-08 DIAGNOSIS — N186 End stage renal disease: Secondary | ICD-10-CM | POA: Diagnosis not present

## 2017-12-08 DIAGNOSIS — D509 Iron deficiency anemia, unspecified: Secondary | ICD-10-CM | POA: Diagnosis not present

## 2017-12-08 DIAGNOSIS — Z992 Dependence on renal dialysis: Secondary | ICD-10-CM | POA: Diagnosis not present

## 2017-12-11 DIAGNOSIS — D509 Iron deficiency anemia, unspecified: Secondary | ICD-10-CM | POA: Diagnosis not present

## 2017-12-11 DIAGNOSIS — Z992 Dependence on renal dialysis: Secondary | ICD-10-CM | POA: Diagnosis not present

## 2017-12-11 DIAGNOSIS — N186 End stage renal disease: Secondary | ICD-10-CM | POA: Diagnosis not present

## 2017-12-13 DIAGNOSIS — N186 End stage renal disease: Secondary | ICD-10-CM | POA: Diagnosis not present

## 2017-12-13 DIAGNOSIS — Z992 Dependence on renal dialysis: Secondary | ICD-10-CM | POA: Diagnosis not present

## 2017-12-13 DIAGNOSIS — D509 Iron deficiency anemia, unspecified: Secondary | ICD-10-CM | POA: Diagnosis not present

## 2017-12-14 DIAGNOSIS — N186 End stage renal disease: Secondary | ICD-10-CM | POA: Diagnosis not present

## 2017-12-14 DIAGNOSIS — Z992 Dependence on renal dialysis: Secondary | ICD-10-CM | POA: Diagnosis not present

## 2017-12-15 DIAGNOSIS — N186 End stage renal disease: Secondary | ICD-10-CM | POA: Diagnosis not present

## 2017-12-15 DIAGNOSIS — N2581 Secondary hyperparathyroidism of renal origin: Secondary | ICD-10-CM | POA: Diagnosis not present

## 2017-12-15 DIAGNOSIS — D509 Iron deficiency anemia, unspecified: Secondary | ICD-10-CM | POA: Diagnosis not present

## 2017-12-15 DIAGNOSIS — Z992 Dependence on renal dialysis: Secondary | ICD-10-CM | POA: Diagnosis not present

## 2017-12-18 DIAGNOSIS — N186 End stage renal disease: Secondary | ICD-10-CM | POA: Diagnosis not present

## 2017-12-18 DIAGNOSIS — N2581 Secondary hyperparathyroidism of renal origin: Secondary | ICD-10-CM | POA: Diagnosis not present

## 2017-12-18 DIAGNOSIS — D509 Iron deficiency anemia, unspecified: Secondary | ICD-10-CM | POA: Diagnosis not present

## 2017-12-18 DIAGNOSIS — Z992 Dependence on renal dialysis: Secondary | ICD-10-CM | POA: Diagnosis not present

## 2017-12-20 DIAGNOSIS — N186 End stage renal disease: Secondary | ICD-10-CM | POA: Diagnosis not present

## 2017-12-20 DIAGNOSIS — Z992 Dependence on renal dialysis: Secondary | ICD-10-CM | POA: Diagnosis not present

## 2017-12-20 DIAGNOSIS — D509 Iron deficiency anemia, unspecified: Secondary | ICD-10-CM | POA: Diagnosis not present

## 2017-12-20 DIAGNOSIS — N2581 Secondary hyperparathyroidism of renal origin: Secondary | ICD-10-CM | POA: Diagnosis not present

## 2017-12-22 DIAGNOSIS — D509 Iron deficiency anemia, unspecified: Secondary | ICD-10-CM | POA: Diagnosis not present

## 2017-12-22 DIAGNOSIS — N2581 Secondary hyperparathyroidism of renal origin: Secondary | ICD-10-CM | POA: Diagnosis not present

## 2017-12-22 DIAGNOSIS — N186 End stage renal disease: Secondary | ICD-10-CM | POA: Diagnosis not present

## 2017-12-22 DIAGNOSIS — Z992 Dependence on renal dialysis: Secondary | ICD-10-CM | POA: Diagnosis not present

## 2017-12-25 DIAGNOSIS — Z992 Dependence on renal dialysis: Secondary | ICD-10-CM | POA: Diagnosis not present

## 2017-12-25 DIAGNOSIS — N186 End stage renal disease: Secondary | ICD-10-CM | POA: Diagnosis not present

## 2017-12-25 DIAGNOSIS — D509 Iron deficiency anemia, unspecified: Secondary | ICD-10-CM | POA: Diagnosis not present

## 2017-12-25 DIAGNOSIS — N2581 Secondary hyperparathyroidism of renal origin: Secondary | ICD-10-CM | POA: Diagnosis not present

## 2017-12-26 ENCOUNTER — Ambulatory Visit: Payer: Medicare Other | Admitting: Cardiovascular Disease

## 2017-12-27 DIAGNOSIS — D509 Iron deficiency anemia, unspecified: Secondary | ICD-10-CM | POA: Diagnosis not present

## 2017-12-27 DIAGNOSIS — N186 End stage renal disease: Secondary | ICD-10-CM | POA: Diagnosis not present

## 2017-12-27 DIAGNOSIS — N2581 Secondary hyperparathyroidism of renal origin: Secondary | ICD-10-CM | POA: Diagnosis not present

## 2017-12-27 DIAGNOSIS — Z992 Dependence on renal dialysis: Secondary | ICD-10-CM | POA: Diagnosis not present

## 2017-12-29 DIAGNOSIS — Z992 Dependence on renal dialysis: Secondary | ICD-10-CM | POA: Diagnosis not present

## 2017-12-29 DIAGNOSIS — D509 Iron deficiency anemia, unspecified: Secondary | ICD-10-CM | POA: Diagnosis not present

## 2017-12-29 DIAGNOSIS — N2581 Secondary hyperparathyroidism of renal origin: Secondary | ICD-10-CM | POA: Diagnosis not present

## 2017-12-29 DIAGNOSIS — N186 End stage renal disease: Secondary | ICD-10-CM | POA: Diagnosis not present

## 2018-01-01 DIAGNOSIS — N186 End stage renal disease: Secondary | ICD-10-CM | POA: Diagnosis not present

## 2018-01-01 DIAGNOSIS — N2581 Secondary hyperparathyroidism of renal origin: Secondary | ICD-10-CM | POA: Diagnosis not present

## 2018-01-01 DIAGNOSIS — Z992 Dependence on renal dialysis: Secondary | ICD-10-CM | POA: Diagnosis not present

## 2018-01-01 DIAGNOSIS — D509 Iron deficiency anemia, unspecified: Secondary | ICD-10-CM | POA: Diagnosis not present

## 2018-01-03 DIAGNOSIS — N186 End stage renal disease: Secondary | ICD-10-CM | POA: Diagnosis not present

## 2018-01-03 DIAGNOSIS — D509 Iron deficiency anemia, unspecified: Secondary | ICD-10-CM | POA: Diagnosis not present

## 2018-01-03 DIAGNOSIS — Z992 Dependence on renal dialysis: Secondary | ICD-10-CM | POA: Diagnosis not present

## 2018-01-03 DIAGNOSIS — N2581 Secondary hyperparathyroidism of renal origin: Secondary | ICD-10-CM | POA: Diagnosis not present

## 2018-01-04 DIAGNOSIS — M545 Low back pain: Secondary | ICD-10-CM | POA: Diagnosis not present

## 2018-01-04 DIAGNOSIS — M169 Osteoarthritis of hip, unspecified: Secondary | ICD-10-CM | POA: Diagnosis not present

## 2018-01-04 DIAGNOSIS — G894 Chronic pain syndrome: Secondary | ICD-10-CM | POA: Diagnosis not present

## 2018-01-04 DIAGNOSIS — M47816 Spondylosis without myelopathy or radiculopathy, lumbar region: Secondary | ICD-10-CM | POA: Diagnosis not present

## 2018-01-05 DIAGNOSIS — N186 End stage renal disease: Secondary | ICD-10-CM | POA: Diagnosis not present

## 2018-01-05 DIAGNOSIS — Z992 Dependence on renal dialysis: Secondary | ICD-10-CM | POA: Diagnosis not present

## 2018-01-05 DIAGNOSIS — D509 Iron deficiency anemia, unspecified: Secondary | ICD-10-CM | POA: Diagnosis not present

## 2018-01-05 DIAGNOSIS — N2581 Secondary hyperparathyroidism of renal origin: Secondary | ICD-10-CM | POA: Diagnosis not present

## 2018-01-08 DIAGNOSIS — D509 Iron deficiency anemia, unspecified: Secondary | ICD-10-CM | POA: Diagnosis not present

## 2018-01-08 DIAGNOSIS — N2581 Secondary hyperparathyroidism of renal origin: Secondary | ICD-10-CM | POA: Diagnosis not present

## 2018-01-08 DIAGNOSIS — N186 End stage renal disease: Secondary | ICD-10-CM | POA: Diagnosis not present

## 2018-01-08 DIAGNOSIS — Z794 Long term (current) use of insulin: Secondary | ICD-10-CM | POA: Diagnosis not present

## 2018-01-08 DIAGNOSIS — Z992 Dependence on renal dialysis: Secondary | ICD-10-CM | POA: Diagnosis not present

## 2018-01-08 DIAGNOSIS — E109 Type 1 diabetes mellitus without complications: Secondary | ICD-10-CM | POA: Diagnosis not present

## 2018-01-10 DIAGNOSIS — H539 Unspecified visual disturbance: Secondary | ICD-10-CM | POA: Diagnosis not present

## 2018-01-10 DIAGNOSIS — F17210 Nicotine dependence, cigarettes, uncomplicated: Secondary | ICD-10-CM | POA: Diagnosis not present

## 2018-01-10 DIAGNOSIS — Z79899 Other long term (current) drug therapy: Secondary | ICD-10-CM | POA: Diagnosis not present

## 2018-01-10 DIAGNOSIS — Z888 Allergy status to other drugs, medicaments and biological substances status: Secondary | ICD-10-CM | POA: Diagnosis not present

## 2018-01-10 DIAGNOSIS — R251 Tremor, unspecified: Secondary | ICD-10-CM | POA: Diagnosis not present

## 2018-01-10 DIAGNOSIS — Z886 Allergy status to analgesic agent status: Secondary | ICD-10-CM | POA: Diagnosis not present

## 2018-01-11 DIAGNOSIS — D509 Iron deficiency anemia, unspecified: Secondary | ICD-10-CM | POA: Diagnosis not present

## 2018-01-11 DIAGNOSIS — N2581 Secondary hyperparathyroidism of renal origin: Secondary | ICD-10-CM | POA: Diagnosis not present

## 2018-01-11 DIAGNOSIS — Z992 Dependence on renal dialysis: Secondary | ICD-10-CM | POA: Diagnosis not present

## 2018-01-11 DIAGNOSIS — N186 End stage renal disease: Secondary | ICD-10-CM | POA: Diagnosis not present

## 2018-01-12 DIAGNOSIS — D509 Iron deficiency anemia, unspecified: Secondary | ICD-10-CM | POA: Diagnosis not present

## 2018-01-12 DIAGNOSIS — Z992 Dependence on renal dialysis: Secondary | ICD-10-CM | POA: Diagnosis not present

## 2018-01-12 DIAGNOSIS — N186 End stage renal disease: Secondary | ICD-10-CM | POA: Diagnosis not present

## 2018-01-12 DIAGNOSIS — N2581 Secondary hyperparathyroidism of renal origin: Secondary | ICD-10-CM | POA: Diagnosis not present

## 2018-01-14 DIAGNOSIS — Z992 Dependence on renal dialysis: Secondary | ICD-10-CM | POA: Diagnosis not present

## 2018-01-14 DIAGNOSIS — N186 End stage renal disease: Secondary | ICD-10-CM | POA: Diagnosis not present

## 2018-01-15 DIAGNOSIS — Z992 Dependence on renal dialysis: Secondary | ICD-10-CM | POA: Diagnosis not present

## 2018-01-15 DIAGNOSIS — N2581 Secondary hyperparathyroidism of renal origin: Secondary | ICD-10-CM | POA: Diagnosis not present

## 2018-01-15 DIAGNOSIS — D509 Iron deficiency anemia, unspecified: Secondary | ICD-10-CM | POA: Diagnosis not present

## 2018-01-15 DIAGNOSIS — N186 End stage renal disease: Secondary | ICD-10-CM | POA: Diagnosis not present

## 2018-01-17 DIAGNOSIS — Z992 Dependence on renal dialysis: Secondary | ICD-10-CM | POA: Diagnosis not present

## 2018-01-17 DIAGNOSIS — N186 End stage renal disease: Secondary | ICD-10-CM | POA: Diagnosis not present

## 2018-01-17 DIAGNOSIS — D509 Iron deficiency anemia, unspecified: Secondary | ICD-10-CM | POA: Diagnosis not present

## 2018-01-17 DIAGNOSIS — N2581 Secondary hyperparathyroidism of renal origin: Secondary | ICD-10-CM | POA: Diagnosis not present

## 2018-01-18 DIAGNOSIS — G894 Chronic pain syndrome: Secondary | ICD-10-CM | POA: Diagnosis not present

## 2018-01-18 DIAGNOSIS — Z79891 Long term (current) use of opiate analgesic: Secondary | ICD-10-CM | POA: Diagnosis not present

## 2018-01-19 DIAGNOSIS — N186 End stage renal disease: Secondary | ICD-10-CM | POA: Diagnosis not present

## 2018-01-19 DIAGNOSIS — Z992 Dependence on renal dialysis: Secondary | ICD-10-CM | POA: Diagnosis not present

## 2018-01-19 DIAGNOSIS — N2581 Secondary hyperparathyroidism of renal origin: Secondary | ICD-10-CM | POA: Diagnosis not present

## 2018-01-19 DIAGNOSIS — D509 Iron deficiency anemia, unspecified: Secondary | ICD-10-CM | POA: Diagnosis not present

## 2018-01-22 DIAGNOSIS — D509 Iron deficiency anemia, unspecified: Secondary | ICD-10-CM | POA: Diagnosis not present

## 2018-01-22 DIAGNOSIS — Z992 Dependence on renal dialysis: Secondary | ICD-10-CM | POA: Diagnosis not present

## 2018-01-22 DIAGNOSIS — N186 End stage renal disease: Secondary | ICD-10-CM | POA: Diagnosis not present

## 2018-01-22 DIAGNOSIS — N2581 Secondary hyperparathyroidism of renal origin: Secondary | ICD-10-CM | POA: Diagnosis not present

## 2018-01-24 DIAGNOSIS — D509 Iron deficiency anemia, unspecified: Secondary | ICD-10-CM | POA: Diagnosis not present

## 2018-01-24 DIAGNOSIS — Z992 Dependence on renal dialysis: Secondary | ICD-10-CM | POA: Diagnosis not present

## 2018-01-24 DIAGNOSIS — N186 End stage renal disease: Secondary | ICD-10-CM | POA: Diagnosis not present

## 2018-01-24 DIAGNOSIS — N2581 Secondary hyperparathyroidism of renal origin: Secondary | ICD-10-CM | POA: Diagnosis not present

## 2018-01-26 DIAGNOSIS — N186 End stage renal disease: Secondary | ICD-10-CM | POA: Diagnosis not present

## 2018-01-26 DIAGNOSIS — N2581 Secondary hyperparathyroidism of renal origin: Secondary | ICD-10-CM | POA: Diagnosis not present

## 2018-01-26 DIAGNOSIS — Z992 Dependence on renal dialysis: Secondary | ICD-10-CM | POA: Diagnosis not present

## 2018-01-26 DIAGNOSIS — D509 Iron deficiency anemia, unspecified: Secondary | ICD-10-CM | POA: Diagnosis not present

## 2018-01-29 DIAGNOSIS — N186 End stage renal disease: Secondary | ICD-10-CM | POA: Diagnosis not present

## 2018-01-29 DIAGNOSIS — N2581 Secondary hyperparathyroidism of renal origin: Secondary | ICD-10-CM | POA: Diagnosis not present

## 2018-01-29 DIAGNOSIS — D509 Iron deficiency anemia, unspecified: Secondary | ICD-10-CM | POA: Diagnosis not present

## 2018-01-29 DIAGNOSIS — Z992 Dependence on renal dialysis: Secondary | ICD-10-CM | POA: Diagnosis not present

## 2018-01-31 DIAGNOSIS — Z992 Dependence on renal dialysis: Secondary | ICD-10-CM | POA: Diagnosis not present

## 2018-01-31 DIAGNOSIS — D509 Iron deficiency anemia, unspecified: Secondary | ICD-10-CM | POA: Diagnosis not present

## 2018-01-31 DIAGNOSIS — N2581 Secondary hyperparathyroidism of renal origin: Secondary | ICD-10-CM | POA: Diagnosis not present

## 2018-01-31 DIAGNOSIS — N186 End stage renal disease: Secondary | ICD-10-CM | POA: Diagnosis not present

## 2018-02-01 DIAGNOSIS — M47816 Spondylosis without myelopathy or radiculopathy, lumbar region: Secondary | ICD-10-CM | POA: Diagnosis not present

## 2018-02-01 DIAGNOSIS — G894 Chronic pain syndrome: Secondary | ICD-10-CM | POA: Diagnosis not present

## 2018-02-01 DIAGNOSIS — M169 Osteoarthritis of hip, unspecified: Secondary | ICD-10-CM | POA: Diagnosis not present

## 2018-02-02 DIAGNOSIS — N186 End stage renal disease: Secondary | ICD-10-CM | POA: Diagnosis not present

## 2018-02-02 DIAGNOSIS — N2581 Secondary hyperparathyroidism of renal origin: Secondary | ICD-10-CM | POA: Diagnosis not present

## 2018-02-02 DIAGNOSIS — Z992 Dependence on renal dialysis: Secondary | ICD-10-CM | POA: Diagnosis not present

## 2018-02-02 DIAGNOSIS — D509 Iron deficiency anemia, unspecified: Secondary | ICD-10-CM | POA: Diagnosis not present

## 2018-02-05 DIAGNOSIS — N2581 Secondary hyperparathyroidism of renal origin: Secondary | ICD-10-CM | POA: Diagnosis not present

## 2018-02-05 DIAGNOSIS — Z992 Dependence on renal dialysis: Secondary | ICD-10-CM | POA: Diagnosis not present

## 2018-02-05 DIAGNOSIS — D509 Iron deficiency anemia, unspecified: Secondary | ICD-10-CM | POA: Diagnosis not present

## 2018-02-05 DIAGNOSIS — N186 End stage renal disease: Secondary | ICD-10-CM | POA: Diagnosis not present

## 2018-02-07 DIAGNOSIS — Z992 Dependence on renal dialysis: Secondary | ICD-10-CM | POA: Diagnosis not present

## 2018-02-07 DIAGNOSIS — N2581 Secondary hyperparathyroidism of renal origin: Secondary | ICD-10-CM | POA: Diagnosis not present

## 2018-02-07 DIAGNOSIS — N186 End stage renal disease: Secondary | ICD-10-CM | POA: Diagnosis not present

## 2018-02-07 DIAGNOSIS — D509 Iron deficiency anemia, unspecified: Secondary | ICD-10-CM | POA: Diagnosis not present

## 2018-02-09 DIAGNOSIS — N2581 Secondary hyperparathyroidism of renal origin: Secondary | ICD-10-CM | POA: Diagnosis not present

## 2018-02-09 DIAGNOSIS — N186 End stage renal disease: Secondary | ICD-10-CM | POA: Diagnosis not present

## 2018-02-09 DIAGNOSIS — Z992 Dependence on renal dialysis: Secondary | ICD-10-CM | POA: Diagnosis not present

## 2018-02-09 DIAGNOSIS — D509 Iron deficiency anemia, unspecified: Secondary | ICD-10-CM | POA: Diagnosis not present

## 2018-02-12 DIAGNOSIS — D509 Iron deficiency anemia, unspecified: Secondary | ICD-10-CM | POA: Diagnosis not present

## 2018-02-12 DIAGNOSIS — N186 End stage renal disease: Secondary | ICD-10-CM | POA: Diagnosis not present

## 2018-02-12 DIAGNOSIS — N2581 Secondary hyperparathyroidism of renal origin: Secondary | ICD-10-CM | POA: Diagnosis not present

## 2018-02-12 DIAGNOSIS — Z992 Dependence on renal dialysis: Secondary | ICD-10-CM | POA: Diagnosis not present

## 2018-02-13 DIAGNOSIS — J441 Chronic obstructive pulmonary disease with (acute) exacerbation: Secondary | ICD-10-CM | POA: Diagnosis not present

## 2018-02-13 DIAGNOSIS — Z992 Dependence on renal dialysis: Secondary | ICD-10-CM | POA: Diagnosis not present

## 2018-02-13 DIAGNOSIS — N186 End stage renal disease: Secondary | ICD-10-CM | POA: Diagnosis not present

## 2018-02-13 DIAGNOSIS — Z1389 Encounter for screening for other disorder: Secondary | ICD-10-CM | POA: Diagnosis not present

## 2018-02-13 DIAGNOSIS — I10 Essential (primary) hypertension: Secondary | ICD-10-CM | POA: Diagnosis not present

## 2018-02-13 DIAGNOSIS — Z0000 Encounter for general adult medical examination without abnormal findings: Secondary | ICD-10-CM | POA: Diagnosis not present

## 2018-02-13 DIAGNOSIS — J440 Chronic obstructive pulmonary disease with acute lower respiratory infection: Secondary | ICD-10-CM | POA: Diagnosis not present

## 2018-02-13 DIAGNOSIS — E1122 Type 2 diabetes mellitus with diabetic chronic kidney disease: Secondary | ICD-10-CM | POA: Diagnosis not present

## 2018-02-13 DIAGNOSIS — E0829 Diabetes mellitus due to underlying condition with other diabetic kidney complication: Secondary | ICD-10-CM | POA: Diagnosis not present

## 2018-02-14 DIAGNOSIS — D509 Iron deficiency anemia, unspecified: Secondary | ICD-10-CM | POA: Diagnosis not present

## 2018-02-14 DIAGNOSIS — N186 End stage renal disease: Secondary | ICD-10-CM | POA: Diagnosis not present

## 2018-02-14 DIAGNOSIS — Z992 Dependence on renal dialysis: Secondary | ICD-10-CM | POA: Diagnosis not present

## 2018-02-16 DIAGNOSIS — D509 Iron deficiency anemia, unspecified: Secondary | ICD-10-CM | POA: Diagnosis not present

## 2018-02-16 DIAGNOSIS — Z992 Dependence on renal dialysis: Secondary | ICD-10-CM | POA: Diagnosis not present

## 2018-02-16 DIAGNOSIS — N186 End stage renal disease: Secondary | ICD-10-CM | POA: Diagnosis not present

## 2018-02-19 DIAGNOSIS — D509 Iron deficiency anemia, unspecified: Secondary | ICD-10-CM | POA: Diagnosis not present

## 2018-02-19 DIAGNOSIS — N186 End stage renal disease: Secondary | ICD-10-CM | POA: Diagnosis not present

## 2018-02-19 DIAGNOSIS — Z992 Dependence on renal dialysis: Secondary | ICD-10-CM | POA: Diagnosis not present

## 2018-02-20 DIAGNOSIS — Z992 Dependence on renal dialysis: Secondary | ICD-10-CM | POA: Diagnosis not present

## 2018-02-20 DIAGNOSIS — N186 End stage renal disease: Secondary | ICD-10-CM | POA: Diagnosis not present

## 2018-02-20 DIAGNOSIS — E8770 Fluid overload, unspecified: Secondary | ICD-10-CM | POA: Diagnosis not present

## 2018-02-21 DIAGNOSIS — D509 Iron deficiency anemia, unspecified: Secondary | ICD-10-CM | POA: Diagnosis not present

## 2018-02-21 DIAGNOSIS — N186 End stage renal disease: Secondary | ICD-10-CM | POA: Diagnosis not present

## 2018-02-21 DIAGNOSIS — Z992 Dependence on renal dialysis: Secondary | ICD-10-CM | POA: Diagnosis not present

## 2018-02-23 DIAGNOSIS — Z992 Dependence on renal dialysis: Secondary | ICD-10-CM | POA: Diagnosis not present

## 2018-02-23 DIAGNOSIS — D509 Iron deficiency anemia, unspecified: Secondary | ICD-10-CM | POA: Diagnosis not present

## 2018-02-23 DIAGNOSIS — N186 End stage renal disease: Secondary | ICD-10-CM | POA: Diagnosis not present

## 2018-02-26 DIAGNOSIS — Z992 Dependence on renal dialysis: Secondary | ICD-10-CM | POA: Diagnosis not present

## 2018-02-26 DIAGNOSIS — N186 End stage renal disease: Secondary | ICD-10-CM | POA: Diagnosis not present

## 2018-02-26 DIAGNOSIS — D509 Iron deficiency anemia, unspecified: Secondary | ICD-10-CM | POA: Diagnosis not present

## 2018-02-28 DIAGNOSIS — D509 Iron deficiency anemia, unspecified: Secondary | ICD-10-CM | POA: Diagnosis not present

## 2018-02-28 DIAGNOSIS — Z992 Dependence on renal dialysis: Secondary | ICD-10-CM | POA: Diagnosis not present

## 2018-02-28 DIAGNOSIS — N186 End stage renal disease: Secondary | ICD-10-CM | POA: Diagnosis not present

## 2018-03-01 DIAGNOSIS — G894 Chronic pain syndrome: Secondary | ICD-10-CM | POA: Diagnosis not present

## 2018-03-01 DIAGNOSIS — M25561 Pain in right knee: Secondary | ICD-10-CM | POA: Diagnosis not present

## 2018-03-01 DIAGNOSIS — M169 Osteoarthritis of hip, unspecified: Secondary | ICD-10-CM | POA: Diagnosis not present

## 2018-03-01 DIAGNOSIS — M47816 Spondylosis without myelopathy or radiculopathy, lumbar region: Secondary | ICD-10-CM | POA: Diagnosis not present

## 2018-03-02 DIAGNOSIS — D509 Iron deficiency anemia, unspecified: Secondary | ICD-10-CM | POA: Diagnosis not present

## 2018-03-02 DIAGNOSIS — Z992 Dependence on renal dialysis: Secondary | ICD-10-CM | POA: Diagnosis not present

## 2018-03-02 DIAGNOSIS — N186 End stage renal disease: Secondary | ICD-10-CM | POA: Diagnosis not present

## 2018-03-05 DIAGNOSIS — N186 End stage renal disease: Secondary | ICD-10-CM | POA: Diagnosis not present

## 2018-03-05 DIAGNOSIS — Z992 Dependence on renal dialysis: Secondary | ICD-10-CM | POA: Diagnosis not present

## 2018-03-05 DIAGNOSIS — D509 Iron deficiency anemia, unspecified: Secondary | ICD-10-CM | POA: Diagnosis not present

## 2018-03-07 DIAGNOSIS — N186 End stage renal disease: Secondary | ICD-10-CM | POA: Diagnosis not present

## 2018-03-07 DIAGNOSIS — D509 Iron deficiency anemia, unspecified: Secondary | ICD-10-CM | POA: Diagnosis not present

## 2018-03-07 DIAGNOSIS — Z992 Dependence on renal dialysis: Secondary | ICD-10-CM | POA: Diagnosis not present

## 2018-03-09 DIAGNOSIS — D509 Iron deficiency anemia, unspecified: Secondary | ICD-10-CM | POA: Diagnosis not present

## 2018-03-09 DIAGNOSIS — N186 End stage renal disease: Secondary | ICD-10-CM | POA: Diagnosis not present

## 2018-03-09 DIAGNOSIS — Z992 Dependence on renal dialysis: Secondary | ICD-10-CM | POA: Diagnosis not present

## 2018-03-12 DIAGNOSIS — D509 Iron deficiency anemia, unspecified: Secondary | ICD-10-CM | POA: Diagnosis not present

## 2018-03-12 DIAGNOSIS — N186 End stage renal disease: Secondary | ICD-10-CM | POA: Diagnosis not present

## 2018-03-12 DIAGNOSIS — Z992 Dependence on renal dialysis: Secondary | ICD-10-CM | POA: Diagnosis not present

## 2018-03-12 NOTE — Progress Notes (Deleted)
Cardiology Office Note    Date:  03/12/2018   ID:  QUINNLYN Soto, DOB [birth year], MRN [id number]  PCP:  Angela Burly, MD  Cardiologist: Angela Sable, MD    No chief complaint on file.   History of Present Illness:    Angela Soto is a 47 y.o. female with past medical history of CAD (s/p prior PCI in 1998, recent catheterization in 10/2017 showing severe two-vessel CAD with heavy calcification along the LCx and 100% CTO of RCA with left to right collaterals noted  --> medical management recommended as not felt to be a CABG candidate), ESRD (on HD - MWF), HTN, HLD, Hypothyroidism, IDDM, PVD (s/p bilateral BKA) and tobacco use who presents to the office today for 64-month follow-up.   She was last examined by myself in 11/2017 following her recent hospitalization at which time her cardiac catheterization showed severe CAD as outlined above. At the time of her visit, she denied any recurrent chest pain or dyspnea on exertion. Reported good compliance with ASA, Plavix, BB, and Imdur (was holding Toprol-XL and Imdur on HD days). Had been intolerant to Atorvastatin secondary to myalgias and nausea, therefore she was started on Crestor 10mg  daily with plans for a repeat FLP and LFT's in 3 months.      Past Medical History:  Diagnosis Date  . Coronary artery disease    a. s/p prior PCI in 1998 b. 10/2017: cath showing severe two-vessel CAD with heavy calcification along the LCx and 100% CTO of RCA with left to right collaterals noted. No good options for PCI. Medical management recommended.   . Environmental allergies    uses inhalers  . ESRD (end stage renal disease) on dialysis [facility])    "started dialysis on 10/08/13; MWF; [facility]; [place], "  . GERD (gastroesophageal reflux disease)   . EHOZYYQM(250.0)    "weekly" (11/09/2017)  . High cholesterol   . Hypertension   . Hypothyroidism   . Multiple sclerosis ([place])    just diagnosed 2014  . Myocardial infarction ([place]) 1998  .  Nonalcoholic steatohepatitis ([facility])   . Peripheral vascular disease ([place])    aortic artery occlusion  . PONV (postoperative nausea and vomiting)   . Thyroid cancer ([place])   . Type 1 diabetes mellitus ([place])   . Umbilical hernia     Past Surgical History:  Procedure Laterality Date  . ABDOMINAL SURGERY  2006   aortic artery occluded, had to "clean it out"  . AMPUTATION Left 09/22/2014   Procedure: AMPUTATION LEFT FOURTH FINGER;  Surgeon: Angela Barker, MD;  Location: [place];  Service: Plastics;  Laterality: Left;  . AV FISTULA PLACEMENT Left 09/24/2013   Procedure: LEFT UPPER EXTREMITY ARTERIOVENOUS (AV) FISTULA CREATION BRACHIAL/CEPHALIC;  Surgeon: Angela Dutch, MD;  Location: [place];  Service: Vascular;  Laterality: Left;  . BASCILIC VEIN TRANSPOSITION Left 02/18/2014   Procedure: BASILIC VEIN TRANSPOSITION - 2ND STAGE LEFT ARM;  Surgeon: Angela Dutch, MD;  Location: [place];  Service: Vascular;  Laterality: Left;  . CARDIAC CATHETERIZATION     "numerous"  . CARDIAC CATHETERIZATION  11/09/2017  . CORONARY ANGIOPLASTY    . CORONARY ANGIOPLASTY WITH STENT PLACEMENT     "think I have a total of 3 stents" (11/09/2017)  . INSERTION OF DIALYSIS CATHETER Right 10/07/2013   Procedure: INSERTION OF DIALYSIS CATHETER;  Surgeon: Angela [place], MD;  Location: [place];  Service: Vascular;  Laterality: Right;  . IRRIGATION AND DEBRIDEMENT ABSCESS Right   .  LEFT HEART CATH AND CORONARY ANGIOGRAPHY N/A 11/09/2017   Procedure: LEFT HEART CATH AND CORONARY ANGIOGRAPHY;  Surgeon: Angela Blanks, MD;  Location: [place] CV LAB;  Service: Cardiovascular;  Laterality: N/A;  . LEG AMPUTATION BELOW KNEE Bilateral 2008-2010   left-right  . PARATHYROIDECTOMY    . SHUNTOGRAM Left 04/17/2014   Procedure: FISTULOGRAM;  Surgeon: Angela Mont[place], MD;  Location: [facility] CATH LAB;  Service: Cardiovascular;  Laterality: Left;  . THYROIDECTOMY      Current Medications: Outpatient Medications Prior to Visit    Medication Sig Dispense Refill  . aspirin EC 81 MG tablet Take 1 tablet (81 mg total) by mouth daily. (Patient taking differently: Take 81 mg by mouth See admin instructions. Daily on Sunday, Tuesday, Thursday and Saturday)    . clopidogrel (PLAVIX) 75 MG tablet Take 75 mg by mouth See admin instructions. At Bedtime on Sunday, Tuesday, Thursday and Saturday    . insulin glargine (LANTUS) 100 UNIT/ML injection Inject 52 Units into the skin at bedtime.     . insulin lispro (HUMALOG) 100 UNIT/ML injection Inject 12 Units into the skin 3 (three) times daily before meals.     . isosorbide mononitrate (IMDUR) 30 MG 24 hr tablet Take 1 tablet (30 mg total) by mouth daily. 30 tablet 0  . levothyroxine (SYNTHROID, LEVOTHROID) 150 MCG tablet Take 150 mcg by mouth daily before breakfast.    . lidocaine-prilocaine (EMLA) cream Apply 1 application topically as needed (for pain/ apply prior to dialysis).    . metoprolol (TOPROL-XL) 200 MG 24 hr tablet Take 200 mg by mouth See admin instructions. Twice daily on Sunday, Tuesday, Thursday and Saturday    . nicotine (NICODERM CQ - DOSED IN MG/24 HOURS) 14 mg/24hr patch Place 1 patch (14 mg total) onto the skin daily. 28 patch 0  . Omega-3 Fatty Acids (FISH OIL) 1000 MG CAPS Take 1,000 mg by mouth 3 (three) times daily.     . Oxycodone HCl 10 MG TABS Take 10 mg by mouth every 6 (six) hours as needed (for pain).     . promethazine (PHENERGAN) 25 MG tablet Take 25 mg by mouth every 6 (six) hours as needed for nausea or vomiting.    . rosuvastatin (CRESTOR) 10 MG tablet Take 1 tablet (10 mg total) by mouth daily. 90 tablet 3  . sevelamer carbonate (RENVELA) 800 MG tablet Take 800-1,600 mg by mouth See admin instructions. Take 1 tablet  (800 mg) with snacks and 2 tablets (1600 mg) with meals on Sunday, Tuesday, Thursday and Saturday    . vancomycin (VANCOCIN) 1 GM/200ML SOLN Inject 200 mLs (1,000 mg total) into the vein every Monday, Wednesday, and Friday with  hemodialysis. 4000 mL    No facility-administered medications prior to visit.      Allergies:   Metformin and related; Chantix [varenicline]; and Ibuprofen   Social History   Socioeconomic History  . Marital status: Married    Spouse name: Not on file  . Number of children: Not on file  . Years of education: Not on file  . Highest education level: Not on file  Occupational History  . Not on file  Social Needs  . Financial resource strain: Not on file  . Food insecurity:    Worry: Not on file    Inability: Not on file  . Transportation needs:    Medical: Not on file    Non-medical: Not on file  Tobacco Use  . Smoking status: Former Smoker  Packs/day: 1.00    Years: 32.00    Pack years: 32.00    Types: Cigarettes    Last attempt to quit: 11/09/2017    Years since quitting: 0.3  . Smokeless tobacco: Never Used  Substance and Sexual Activity  . Alcohol use: No  . Drug use: No  . Sexual activity: Never  Lifestyle  . Physical activity:    Days per week: Not on file    Minutes per session: Not on file  . Stress: Not on file  Relationships  . Social connections:    Talks on phone: Not on file    Gets together: Not on file    Attends religious service: Not on file    Active member of club or organization: Not on file    Attends meetings of clubs or organizations: Not on file    Relationship status: Not on file  Other Topics Concern  . Not on file  Social History Narrative  . Not on file     Family History:  The patient's ***family history includes CAD (age of onset: 78) in her father; Diabetes Mellitus II in her father and mother; Ovarian cancer in her mother.   Review of Systems:   Please see the history of present illness.     General:  No chills, fever, night sweats or weight changes.  Cardiovascular:  No chest pain, dyspnea on exertion, edema, orthopnea, palpitations, paroxysmal nocturnal dyspnea. Dermatological: No rash, lesions/masses Respiratory: No  cough, dyspnea Urologic: No hematuria, dysuria Abdominal:   No nausea, vomiting, diarrhea, bright red blood per rectum, melena, or hematemesis Neurologic:  No visual changes, wkns, changes in mental status. All other systems reviewed and are otherwise negative except as noted above.   Physical Exam:    VS:  LMP 05/24/2013    General: Well developed, well nourished,female appearing in no acute distress. Head: Normocephalic, atraumatic, sclera non-icteric, no xanthomas, nares are without discharge.  Neck: No carotid bruits. JVD not elevated.  Lungs: Respirations regular and unlabored, without wheezes or rales.  Heart: ***Regular rate and rhythm. No S3 or S4.  No murmur, no rubs, or gallops appreciated. Abdomen: Soft, non-tender, non-distended with normoactive bowel sounds. No hepatomegaly. No rebound/guarding. No obvious abdominal masses. Msk:  Strength and tone appear normal for age. No joint deformities or effusions. Extremities: No clubbing or cyanosis. No edema.  Distal pedal pulses are 2+ bilaterally. Neuro: Alert and oriented X 3. Moves all extremities spontaneously. No focal deficits noted. Psych:  Responds to questions appropriately with a normal affect. Skin: No rashes or lesions noted  Wt Readings from Last 3 Encounters:  12/05/17 189 lb 3.2 oz (85.8 kg)  11/10/17 189 lb 6 oz (85.9 kg)  04/11/17 188 lb (85.3 kg)        Studies/Labs Reviewed:   EKG:  EKG is*** ordered today.  The ekg ordered today demonstrates ***  Recent Labs: 11/09/2017: Hemoglobin 13.5; Platelets 183 11/10/2017: BUN 30; Creatinine, Ser 3.74; Potassium 3.4; Sodium 136; TSH 0.426   Lipid Panel    Component Value Date/Time   CHOL 185 11/10/2017 0527   TRIG 295 (H) 11/10/2017 0527   HDL 24 (L) 11/10/2017 0527   CHOLHDL 7.7 11/10/2017 0527   VLDL 59 (H) 11/10/2017 0527   LDLCALC 102 (H) 11/10/2017 0527    Additional studies/ records that were reviewed today include:   Cardiac Catheterization:  10/2017  Mid RCA to Dist RCA lesion is 100% stenosed.   1. Severe double vessel CAD 2.  The LAD is a large caliber vessel that courses to the apex. The mid vessel has a long segment of moderate, calcific stenosis. This does not appear to be flow limiting. The first diagonal is a small caliber vessel with moderate ostial stenosis. The second diagonal branch is a small caliber vessel with mild plaque.  3. The Circumflex is a moderate caliber, heavily calcified vessel from the ostium down into the obtuse marginal branches. There is moderately severe, heavily calcified stenosis from the proximal vessel down into the most distal obtuse marginal branch. The obtuse marginal branch is completely occluded. It appears that the distal segment of this branch fills from bridging collaterals. The vessel is small in caliber after it reconstitutes.  4. The RCA is a large dominant vessel with 100% occlusion just beyond the ostium. The distal vessel and distal branches are seen to fill from left to right collaterals supplied by septal perforators.  5. LV systolic function is overall preserved with segmental wall motion abnormality. LVEF estimated around 45-50%.   Recommendations: She has complex CAD with no good options for revascularization. Her RCA is chronically occluded and fills from collaterals. The entire Circumflex is diffusely diseased and heavily calcified with a distal chronic total occlusion of the OM branch. I would not approach this vessel with PCI. If PCI of the Circumflex were considered, we would have to perform atherectomy of the entire vessel and likely could not cross the distal CTO. The entire vessel would need to be stented and the risk of restenosis would be very high in this patient with diabetes and ongoing tobacco abuse. She is not a candidate for CABG given her comorbid conditions including ESRD and the lack of graft conduit given bilateral lower extremity amputations. At this time, I would  attempt to control her symptoms with medical therapy. She should consider stopping smoking as well.   Echocardiogram: 10/2017 Study Conclusions  - Left ventricle: The cavity size was normal. Wall thickness was   increased in a pattern of mild LVH. Systolic function was normal.   The estimated ejection fraction was in the range of 50% to 55%.   There is hypokinesis of the inferolateral myocardium. Features   are consistent with a pseudonormal left ventricular filling   pattern, with concomitant abnormal relaxation and increased   filling pressure (grade 2 diastolic dysfunction). Doppler   parameters are consistent with high ventricular filling pressure. - Mitral valve: Calcified annulus. - Left atrium: The atrium was severely dilated.  Impressions:  - Hypokinesis of the inferolateral wall with overall low normal LV   systolic function; mild LVH; moderate diastolic dysfunction;   severe LAE.  Assessment:    No diagnosis found.   Plan:   In order of problems listed above:  1. ***    Medication Adjustments/Labs and Tests Ordered: Current medicines are reviewed at length with the patient today.  Concerns regarding medicines are outlined above.  Medication changes, Labs and Tests ordered today are listed in the Patient Instructions below. There are no Patient Instructions on file for this visit.   Signed, Erma Heritage, PA-C  03/12/2018 8:22 PM    [place] [address] [place],  [address] Phone: [phone]

## 2018-03-13 ENCOUNTER — Ambulatory Visit: Payer: Medicare Other | Admitting: Student

## 2018-03-14 DIAGNOSIS — Z992 Dependence on renal dialysis: Secondary | ICD-10-CM | POA: Diagnosis not present

## 2018-03-14 DIAGNOSIS — D509 Iron deficiency anemia, unspecified: Secondary | ICD-10-CM | POA: Diagnosis not present

## 2018-03-14 DIAGNOSIS — N186 End stage renal disease: Secondary | ICD-10-CM | POA: Diagnosis not present

## 2018-03-16 DIAGNOSIS — N186 End stage renal disease: Secondary | ICD-10-CM | POA: Diagnosis not present

## 2018-03-16 DIAGNOSIS — Z992 Dependence on renal dialysis: Secondary | ICD-10-CM | POA: Diagnosis not present

## 2018-03-16 DIAGNOSIS — D509 Iron deficiency anemia, unspecified: Secondary | ICD-10-CM | POA: Diagnosis not present

## 2018-03-19 DIAGNOSIS — N186 End stage renal disease: Secondary | ICD-10-CM | POA: Diagnosis not present

## 2018-03-19 DIAGNOSIS — N2581 Secondary hyperparathyroidism of renal origin: Secondary | ICD-10-CM | POA: Diagnosis not present

## 2018-03-19 DIAGNOSIS — D509 Iron deficiency anemia, unspecified: Secondary | ICD-10-CM | POA: Diagnosis not present

## 2018-03-19 DIAGNOSIS — Z992 Dependence on renal dialysis: Secondary | ICD-10-CM | POA: Diagnosis not present

## 2018-03-21 DIAGNOSIS — Z992 Dependence on renal dialysis: Secondary | ICD-10-CM | POA: Diagnosis not present

## 2018-03-21 DIAGNOSIS — N2581 Secondary hyperparathyroidism of renal origin: Secondary | ICD-10-CM | POA: Diagnosis not present

## 2018-03-21 DIAGNOSIS — D509 Iron deficiency anemia, unspecified: Secondary | ICD-10-CM | POA: Diagnosis not present

## 2018-03-21 DIAGNOSIS — N186 End stage renal disease: Secondary | ICD-10-CM | POA: Diagnosis not present

## 2018-03-23 DIAGNOSIS — N186 End stage renal disease: Secondary | ICD-10-CM | POA: Diagnosis not present

## 2018-03-23 DIAGNOSIS — N2581 Secondary hyperparathyroidism of renal origin: Secondary | ICD-10-CM | POA: Diagnosis not present

## 2018-03-23 DIAGNOSIS — Z992 Dependence on renal dialysis: Secondary | ICD-10-CM | POA: Diagnosis not present

## 2018-03-23 DIAGNOSIS — D509 Iron deficiency anemia, unspecified: Secondary | ICD-10-CM | POA: Diagnosis not present

## 2018-03-26 DIAGNOSIS — Z992 Dependence on renal dialysis: Secondary | ICD-10-CM | POA: Diagnosis not present

## 2018-03-26 DIAGNOSIS — N186 End stage renal disease: Secondary | ICD-10-CM | POA: Diagnosis not present

## 2018-03-26 DIAGNOSIS — N2581 Secondary hyperparathyroidism of renal origin: Secondary | ICD-10-CM | POA: Diagnosis not present

## 2018-03-26 DIAGNOSIS — D509 Iron deficiency anemia, unspecified: Secondary | ICD-10-CM | POA: Diagnosis not present

## 2018-03-28 DIAGNOSIS — Z992 Dependence on renal dialysis: Secondary | ICD-10-CM | POA: Diagnosis not present

## 2018-03-28 DIAGNOSIS — D509 Iron deficiency anemia, unspecified: Secondary | ICD-10-CM | POA: Diagnosis not present

## 2018-03-28 DIAGNOSIS — N186 End stage renal disease: Secondary | ICD-10-CM | POA: Diagnosis not present

## 2018-03-28 DIAGNOSIS — N2581 Secondary hyperparathyroidism of renal origin: Secondary | ICD-10-CM | POA: Diagnosis not present

## 2018-03-29 DIAGNOSIS — M79606 Pain in leg, unspecified: Secondary | ICD-10-CM | POA: Diagnosis not present

## 2018-03-29 DIAGNOSIS — M47816 Spondylosis without myelopathy or radiculopathy, lumbar region: Secondary | ICD-10-CM | POA: Diagnosis not present

## 2018-03-29 DIAGNOSIS — M169 Osteoarthritis of hip, unspecified: Secondary | ICD-10-CM | POA: Diagnosis not present

## 2018-03-29 DIAGNOSIS — G894 Chronic pain syndrome: Secondary | ICD-10-CM | POA: Diagnosis not present

## 2018-03-30 DIAGNOSIS — Z992 Dependence on renal dialysis: Secondary | ICD-10-CM | POA: Diagnosis not present

## 2018-03-30 DIAGNOSIS — N2581 Secondary hyperparathyroidism of renal origin: Secondary | ICD-10-CM | POA: Diagnosis not present

## 2018-03-30 DIAGNOSIS — D509 Iron deficiency anemia, unspecified: Secondary | ICD-10-CM | POA: Diagnosis not present

## 2018-03-30 DIAGNOSIS — N186 End stage renal disease: Secondary | ICD-10-CM | POA: Diagnosis not present

## 2018-04-02 DIAGNOSIS — D509 Iron deficiency anemia, unspecified: Secondary | ICD-10-CM | POA: Diagnosis not present

## 2018-04-02 DIAGNOSIS — N186 End stage renal disease: Secondary | ICD-10-CM | POA: Diagnosis not present

## 2018-04-02 DIAGNOSIS — Z992 Dependence on renal dialysis: Secondary | ICD-10-CM | POA: Diagnosis not present

## 2018-04-02 DIAGNOSIS — N2581 Secondary hyperparathyroidism of renal origin: Secondary | ICD-10-CM | POA: Diagnosis not present

## 2018-04-06 DIAGNOSIS — N2581 Secondary hyperparathyroidism of renal origin: Secondary | ICD-10-CM | POA: Diagnosis not present

## 2018-04-06 DIAGNOSIS — D509 Iron deficiency anemia, unspecified: Secondary | ICD-10-CM | POA: Diagnosis not present

## 2018-04-06 DIAGNOSIS — N186 End stage renal disease: Secondary | ICD-10-CM | POA: Diagnosis not present

## 2018-04-06 DIAGNOSIS — Z992 Dependence on renal dialysis: Secondary | ICD-10-CM | POA: Diagnosis not present

## 2018-04-09 DIAGNOSIS — Z992 Dependence on renal dialysis: Secondary | ICD-10-CM | POA: Diagnosis not present

## 2018-04-09 DIAGNOSIS — N186 End stage renal disease: Secondary | ICD-10-CM | POA: Diagnosis not present

## 2018-04-09 DIAGNOSIS — Z794 Long term (current) use of insulin: Secondary | ICD-10-CM | POA: Diagnosis not present

## 2018-04-09 DIAGNOSIS — D509 Iron deficiency anemia, unspecified: Secondary | ICD-10-CM | POA: Diagnosis not present

## 2018-04-09 DIAGNOSIS — E109 Type 1 diabetes mellitus without complications: Secondary | ICD-10-CM | POA: Diagnosis not present

## 2018-04-09 DIAGNOSIS — N2581 Secondary hyperparathyroidism of renal origin: Secondary | ICD-10-CM | POA: Diagnosis not present

## 2018-04-11 DIAGNOSIS — Z992 Dependence on renal dialysis: Secondary | ICD-10-CM | POA: Diagnosis not present

## 2018-04-11 DIAGNOSIS — N186 End stage renal disease: Secondary | ICD-10-CM | POA: Diagnosis not present

## 2018-04-11 DIAGNOSIS — N2581 Secondary hyperparathyroidism of renal origin: Secondary | ICD-10-CM | POA: Diagnosis not present

## 2018-04-11 DIAGNOSIS — D509 Iron deficiency anemia, unspecified: Secondary | ICD-10-CM | POA: Diagnosis not present

## 2018-04-12 ENCOUNTER — Ambulatory Visit (HOSPITAL_COMMUNITY)
Admission: RE | Admit: 2018-04-12 | Discharge: 2018-04-12 | Disposition: A | Discharge status: Home / Self Care | Payer: Medicare Other | Source: Ambulatory Visit | Attending: Physician Assistant | Admitting: Physician Assistant

## 2018-04-12 ENCOUNTER — Other Ambulatory Visit (HOSPITAL_COMMUNITY): Payer: Self-pay | Admitting: Physician Assistant

## 2018-04-12 ENCOUNTER — Encounter: Payer: Self-pay | Admitting: Student

## 2018-04-12 ENCOUNTER — Ambulatory Visit (INDEPENDENT_AMBULATORY_CARE_PROVIDER_SITE_OTHER): Payer: Medicare Other | Admitting: Student

## 2018-04-12 VITALS — BP 116/72 | HR 61 | Wt 203.9 lb

## 2018-04-12 DIAGNOSIS — I25119 Atherosclerotic heart disease of native coronary artery with unspecified angina pectoris: Secondary | ICD-10-CM | POA: Diagnosis not present

## 2018-04-12 DIAGNOSIS — E785 Hyperlipidemia, unspecified: Secondary | ICD-10-CM | POA: Diagnosis not present

## 2018-04-12 DIAGNOSIS — I10 Essential (primary) hypertension: Secondary | ICD-10-CM | POA: Diagnosis not present

## 2018-04-12 DIAGNOSIS — Z720 Tobacco use: Secondary | ICD-10-CM

## 2018-04-12 DIAGNOSIS — M85862 Other specified disorders of bone density and structure, left lower leg: Secondary | ICD-10-CM | POA: Diagnosis not present

## 2018-04-12 DIAGNOSIS — M25561 Pain in right knee: Secondary | ICD-10-CM | POA: Diagnosis not present

## 2018-04-12 DIAGNOSIS — R52 Pain, unspecified: Secondary | ICD-10-CM

## 2018-04-12 DIAGNOSIS — Z992 Dependence on renal dialysis: Secondary | ICD-10-CM | POA: Diagnosis not present

## 2018-04-12 DIAGNOSIS — N186 End stage renal disease: Secondary | ICD-10-CM | POA: Diagnosis not present

## 2018-04-12 DIAGNOSIS — Z89511 Acquired absence of right leg below knee: Secondary | ICD-10-CM | POA: Insufficient documentation

## 2018-04-12 DIAGNOSIS — M85861 Other specified disorders of bone density and structure, right lower leg: Secondary | ICD-10-CM | POA: Insufficient documentation

## 2018-04-12 DIAGNOSIS — I739 Peripheral vascular disease, unspecified: Secondary | ICD-10-CM | POA: Diagnosis not present

## 2018-04-12 DIAGNOSIS — M1712 Unilateral primary osteoarthritis, left knee: Secondary | ICD-10-CM | POA: Diagnosis not present

## 2018-04-12 DIAGNOSIS — S8991XA Unspecified injury of right lower leg, initial encounter: Secondary | ICD-10-CM | POA: Diagnosis not present

## 2018-04-12 MED ORDER — ISOSORBIDE MONONITRATE ER 60 MG PO TB24: 60.0000 mg | ORAL_TABLET | Freq: Every day | ORAL | 3 refills | Status: DC

## 2018-04-12 NOTE — Patient Instructions (Signed)
Medication Instructions:  Your physician has recommended you make the following change in your medication:  Increase Imdur to 60 mg Daily    Labwork: NONE   Testing/Procedures: NONE   Follow-Up: Your physician recommends that you schedule a follow-up appointment in: 3 Months with Dr. Bronson Ing.    Any Other Special Instructions Will Be Listed Below (If Applicable).     If you need a refill on your cardiac medications before your next appointment, please call your pharmacy. Thank you for choosing [facility]!

## 2018-04-12 NOTE — Progress Notes (Signed)
Cardiology Office Note    Date:  04/12/2018   ID:  JEANIFER HALLIDAY, DOB 06-24-1971, MRN [id number]  PCP:  Neale Burly, MD  Cardiologist: Kate Sable, MD    Chief Complaint  Patient presents with  . Follow-up    3 month visit    History of Present Illness:    Angela Soto is a 47 y.o. female with past medical history of CAD (s/p prior PCI in 1998, recent catheterization in 10/2017 showing severe two-vessel CAD with heavy calcification along the LCx and 100% CTO of RCA with left to right collaterals noted --> medical management recommended as not felt to be a CABG candidate), ESRD (on HD - MWF), HTN, HLD, Hypothyroidism, IDDM, PVD (s/p bilateral BKA) and tobacco use who presents to the office today for 72-month follow-up.   She was last examined by myself in 11/2017 following her recent hospitalization at which time her cardiac catheterization showed severe CAD. At the time of her visit, she denied any recurrent chest pain or dyspnea on exertion. Reported good compliance with ASA, Plavix, BB, and Imdur (was holding Toprol-XL and Imdur on HD days). Had been intolerant to Atorvastatin secondary to myalgias and nausea, therefore she was started on Crestor 10mg  daily with plans for a repeat FLP and LFT's in 3 months.  In talking with the patient today, she reports having intermittent episodes of chest discomfort which occurs during dialysis but says this resolves within a few minutes. Discomfort also occurs when sitting in her wheelchair at home and she is unaware of any precipitating factors. No recent dyspnea, orthopnea, or PND.  She has been experiencing worsening pain along her amputation sites and is scheduled for imaging later today.  She is overall tolerating Crestor well and denies any significant myalgias. Had previously experienced diarrhea with statin therapy and says that she is going to bathroom more regularly but her stools are formed.    Past Medical History:    Diagnosis Date  . Coronary artery disease    a. s/p prior PCI in 1998 b. 10/2017: cath showing severe two-vessel CAD with heavy calcification along the LCx and 100% CTO of RCA with left to right collaterals noted. No good options for PCI. Medical management recommended.   . Environmental allergies    uses inhalers  . ESRD (end stage renal disease) on dialysis [facility])    "started dialysis on 10/08/13; MWF; [facility]; [place], "  . GERD (gastroesophageal reflux disease)   . FOYDXAJO(878.6)    "weekly" (11/09/2017)  . High cholesterol   . Hypertension   . Hypothyroidism   . Multiple sclerosis ([place])    just diagnosed 2014  . Myocardial infarction ([place]) 1998  . Nonalcoholic steatohepatitis ([facility])   . Peripheral vascular disease ([place])    aortic artery occlusion  . PONV (postoperative nausea and vomiting)   . Thyroid cancer ([place])   . Type 1 diabetes mellitus ([place])   . Umbilical hernia     Past Surgical History:  Procedure Laterality Date  . ABDOMINAL SURGERY  2006   aortic artery occluded, had to "clean it out"  . AMPUTATION Left 09/22/2014   Procedure: AMPUTATION LEFT FOURTH FINGER;  Surgeon: Dayna Barker, MD;  Location: [place];  Service: Plastics;  Laterality: Left;  . AV FISTULA PLACEMENT Left 09/24/2013   Procedure: LEFT UPPER EXTREMITY ARTERIOVENOUS (AV) FISTULA CREATION BRACHIAL/CEPHALIC;  Surgeon: Elam Dutch, MD;  Location: [place];  Service: Vascular;  Laterality: Left;  . BASCILIC VEIN TRANSPOSITION  Left 02/18/2014   Procedure: BASILIC VEIN TRANSPOSITION - 2ND STAGE LEFT ARM;  Surgeon: Elam Dutch, MD;  Location: [place];  Service: Vascular;  Laterality: Left;  . CARDIAC CATHETERIZATION     "numerous"  . CARDIAC CATHETERIZATION  11/09/2017  . CORONARY ANGIOPLASTY    . CORONARY ANGIOPLASTY WITH STENT PLACEMENT     "think I have a total of 3 stents" (11/09/2017)  . INSERTION OF DIALYSIS CATHETER Right 10/07/2013   Procedure: INSERTION OF DIALYSIS CATHETER;  Surgeon: Conrad Millbrae, MD;  Location: [place];  Service: Vascular;  Laterality: Right;  . IRRIGATION AND DEBRIDEMENT ABSCESS Right   . LEFT HEART CATH AND CORONARY ANGIOGRAPHY N/A 11/09/2017   Procedure: LEFT HEART CATH AND CORONARY ANGIOGRAPHY;  Surgeon: Burnell Blanks, MD;  Location: [place] CV LAB;  Service: Cardiovascular;  Laterality: N/A;  . LEG AMPUTATION BELOW KNEE Bilateral 2008-2010   left-right  . PARATHYROIDECTOMY    . SHUNTOGRAM Left 04/17/2014   Procedure: FISTULOGRAM;  Surgeon: Conrad Mulberry, MD;  Location: [facility] C[facility] LAB;  Service: Cardiovascular;  Laterality: Left;  . THYROIDECTOMY      Current Medications: Outpatient Medications Prior to Visit  Medication Sig Dispense Refill  . aspirin EC 81 MG tablet Take 1 tablet (81 mg total) by mouth daily. (Patient taking differently: Take 81 mg by mouth See admin instructions. Daily on Sunday, Tuesday, Thursday and Saturday)    . clopidogrel (PLAVIX) 75 MG tablet Take 75 mg by mouth See admin instructions. At Bedtime on Sunday, Tuesday, Thursday and Saturday    . insulin glargine (LANTUS) 100 UNIT/ML injection Inject 52 Units into the skin at bedtime.     . insulin lispro (HUMALOG) 100 UNIT/ML injection Inject 12 Units into the skin 3 (three) times daily before meals.     Marland Kitchen levothyroxine (SYNTHROID, LEVOTHROID) 150 MCG tablet Take 200 mcg by mouth daily before breakfast.     . lidocaine-prilocaine (EMLA) cream Apply 1 application topically as needed (for pain/ apply prior to dialysis).    . metoprolol (TOPROL-XL) 200 MG 24 hr tablet Take 200 mg by mouth See admin instructions. Twice daily on Sunday, Tuesday, Thursday and Saturday    . nicotine (NICODERM CQ - DOSED IN MG/24 HOURS) 14 mg/24hr patch Place 1 patch (14 mg total) onto the skin daily. 28 patch 0  . Omega-3 Fatty Acids (FISH OIL) 1000 MG CAPS Take 1,000 mg by mouth 3 (three) times daily.     . Oxycodone HCl 10 MG TABS Take 10 mg by mouth every 6 (six) hours as needed (for pain).     .  promethazine (PHENERGAN) 25 MG tablet Take 25 mg by mouth every 6 (six) hours as needed for nausea or vomiting.    . sevelamer carbonate (RENVELA) 800 MG tablet Take 800-1,600 mg by mouth See admin instructions. Take 1 tablet  (800 mg) with snacks and 2 tablets (1600 mg) with meals on Sunday, Tuesday, Thursday and Saturday    . vancomycin (VANCOCIN) 1 GM/200ML SOLN Inject 200 mLs (1,000 mg total) into the vein every Monday, Wednesday, and Friday with hemodialysis. 4000 mL   . isosorbide mononitrate (IMDUR) 30 MG 24 hr tablet Take 1 tablet (30 mg total) by mouth daily. 30 tablet 0  . rosuvastatin (CRESTOR) 10 MG tablet Take 1 tablet (10 mg total) by mouth daily. 90 tablet 3   No facility-administered medications prior to visit.      Allergies:   Metformin and related; Chantix [varenicline];  and Ibuprofen   Social History   Socioeconomic History  . Marital status: Married    Spouse name: Not on file  . Number of children: Not on file  . Years of education: Not on file  . Highest education level: Not on file  Occupational History  . Not on file  Social Needs  . Financial resource strain: Not on file  . Food insecurity:    Worry: Not on file    Inability: Not on file  . Transportation needs:    Medical: Not on file    Non-medical: Not on file  Tobacco Use  . Smoking status: Current Every Day Smoker    Packs/day: 1.00    Years: 32.00    Pack years: 32.00    Types: Cigarettes  . Smokeless tobacco: Never Used  Substance and Sexual Activity  . Alcohol use: No  . Drug use: No  . Sexual activity: Never  Lifestyle  . Physical activity:    Days per week: Not on file    Minutes per session: Not on file  . Stress: Not on file  Relationships  . Social connections:    Talks on phone: Not on file    Gets together: Not on file    Attends religious service: Not on file    Active member of club or organization: Not on file    Attends meetings of clubs or organizations: Not on file     Relationship status: Not on file  Other Topics Concern  . Not on file  Social History Narrative  . Not on file     Family History:  The patient's family history includes CAD (age of onset: 41) in her father; Diabetes Mellitus II in her father and mother; Ovarian cancer in her mother.   Review of Systems:   Please see the history of present illness.     General:  No chills, fever, night sweats or weight changes.  Cardiovascular:  No dyspnea on exertion, edema, orthopnea, palpitations, paroxysmal nocturnal dyspnea. Positive for chest pain.  Dermatological: No rash, lesions/masses Respiratory: No cough, dyspnea Urologic: No hematuria, dysuria Abdominal:   No nausea, vomiting, diarrhea, bright red blood per rectum, melena, or hematemesis Neurologic:  No visual changes, wkns, changes in mental status. All other systems reviewed and are otherwise negative except as noted above.   Physical Exam:    VS:  BP 116/72 (BP Location: Right Arm)   Pulse 61   Wt 203 lb 14.8 oz (92.5 kg)   LMP 05/24/2013   SpO2 96%   BMI 55.12 kg/m    General: Well developed, well nourished Caucasian female appearing in no acute distress. Head: Normocephalic, atraumatic, sclera non-icteric, no xanthomas, nares are without discharge.  Neck: No carotid bruits. JVD not elevated.  Lungs: Respirations regular and unlabored, without wheezes or rales.  Heart: Regular rate and rhythm. No S3 or S4.  No murmur, no rubs, or gallops appreciated. Abdomen: Soft, non-tender, non-distended with normoactive bowel sounds. No hepatomegaly. No rebound/guarding. No obvious abdominal masses. Msk:  Strength and tone appear normal for age. No joint deformities or effusions. Extremities: No clubbing or cyanosis. No edema.  Bilateral BKA's. Upper extremity fistula noted. Neuro: Alert and oriented X 3. Moves all extremities spontaneously. No focal deficits noted.  Psych:  Responds to questions appropriately with a normal affect. Skin:  No rashes or lesions noted  Wt Readings from Last 3 Encounters:  04/12/18 203 lb 14.8 oz (92.5 kg)  12/05/17 189 lb 3.2  oz (85.8 kg)  11/10/17 189 lb 6 oz (85.9 kg)     Studies/Labs Reviewed:   EKG:  EKG is not ordered today.   Recent Labs: 11/09/2017: Hemoglobin 13.5; Platelets 183 11/10/2017: BUN 30; Creatinine, Ser 3.74; Potassium 3.4; Sodium 136; TSH 0.426   Lipid Panel    Component Value Date/Time   CHOL 185 11/10/2017 0527   TRIG 295 (H) 11/10/2017 0527   HDL 24 (L) 11/10/2017 0527   CHOLHDL 7.7 11/10/2017 0527   VLDL 59 (H) 11/10/2017 0527   LDLCALC 102 (H) 11/10/2017 0527    Additional studies/ records that were reviewed today include:   Echocardiogram: 10/2017 Study Conclusions  - Left ventricle: The cavity size was normal. Wall thickness was   increased in a pattern of mild LVH. Systolic function was normal.   The estimated ejection fraction was in the range of 50% to 55%.   There is hypokinesis of the inferolateral myocardium. Features   are consistent with a pseudonormal left ventricular filling   pattern, with concomitant abnormal relaxation and increased   filling pressure (grade 2 diastolic dysfunction). Doppler   parameters are consistent with high ventricular filling pressure. - Mitral valve: Calcified annulus. - Left atrium: The atrium was severely dilated.  Impressions:  - Hypokinesis of the inferolateral wall with overall low normal LV   systolic function; mild LVH; moderate diastolic dysfunction;   severe LAE.  Cardiac Catheterization: 10/2017  Mid RCA to Dist RCA lesion is 100% stenosed.   1. Severe double vessel CAD 2. The LAD is a large caliber vessel that courses to the apex. The mid vessel has a long segment of moderate, calcific stenosis. This does not appear to be flow limiting. The first diagonal is a small caliber vessel with moderate ostial stenosis. The second diagonal branch is a small caliber vessel with mild plaque.  3. The  Circumflex is a moderate caliber, heavily calcified vessel from the ostium down into the obtuse marginal branches. There is moderately severe, heavily calcified stenosis from the proximal vessel down into the most distal obtuse marginal branch. The obtuse marginal branch is completely occluded. It appears that the distal segment of this branch fills from bridging collaterals. The vessel is small in caliber after it reconstitutes.  4. The RCA is a large dominant vessel with 100% occlusion just beyond the ostium. The distal vessel and distal branches are seen to fill from left to right collaterals supplied by septal perforators.  5. LV systolic function is overall preserved with segmental wall motion abnormality. LVEF estimated around 45-50%.   Recommendations: She has complex CAD with no good options for revascularization. Her RCA is chronically occluded and fills from collaterals. The entire Circumflex is diffusely diseased and heavily calcified with a distal chronic total occlusion of the OM branch. I would not approach this vessel with PCI. If PCI of the Circumflex were considered, we would have to perform atherectomy of the entire vessel and likely could not cross the distal CTO. The entire vessel would need to be stented and the risk of restenosis would be very high in this patient with diabetes and ongoing tobacco abuse. She is not a candidate for CABG given her comorbid conditions including ESRD and the lack of graft conduit given bilateral lower extremity amputations. At this time, I would attempt to control her symptoms with medical therapy. She should consider stopping smoking as well.    Assessment:    1. Coronary artery disease involving native coronary artery of native  heart with angina pectoris ([place])   2. Essential hypertension   3. Hyperlipidemia LDL goal <70   4. ESRD (end stage renal disease) on dialysis ([place])   5. Tobacco use      Plan:   In order of problems listed above:  1.  CAD - recent catheterization in 10/2017 showed severe two-vessel CAD with heavy calcification along the LCx and 100% CTO of RCA with left to right collaterals noted and medical management was recommended at that time as she was not felt to be a CABG candidate given her multiple comorbidities. - She has been experiencing intermittent episodes of chest discomfort a few times per week but says this is not as intense as what she experienced earlier this year. Will plan to further titrate Imdur to 60 mg daily for further antianginal benefit. Continue ASA, Plavix, and beta-blocker therapy.  2. HTN - BP is well controlled at 116/72 during today's visit.  - Continue Toprol-XL and Imdur with dose adjustment as outlined above. She does hold her medications on the mornings of HD.   3. HLD - FLP in 10/2017 showed total cholesterol 185, triglycerides 295, HDL 24, and LDL 102. Had previously been intolerant to high-intensity statins and was started on Crestor 10 mg daily at the time of her last office visit in 11/2017. Has overall been tolerating this well. - due for fasting labs with her PCP next month and she wishes to wait until that time for bloodwork. Will request a copy of her labs at that time.   4. ESRD - on HD - Monday, Wesnesday, Friday schedule. Follows with Dr. Lowanda Foster.   5. Tobacco Use - has switched to vaping and is using nicotine patches intermittently. Says nicotine patches were not covered by her insurance previously and she experienced nightmares while on Chantix. Continued reduction with eventual tobacco cessation advised.     Medication Adjustments/Labs and Tests Ordered: Current medicines are reviewed at length with the patient today.  Concerns regarding medicines are outlined above.  Medication changes, Labs and Tests ordered today are listed in the Patient Instructions below. Patient Instructions  Medication Instructions:  Your physician has recommended you make the following change  in your medication:  Increase Imdur to 60 mg Daily   Labwork: NONE   Testing/Procedures: NONE   Follow-Up: Your physician recommends that you schedule a follow-up appointment in: 3 Months with Dr. Bronson Ing.    Any Other Special Instructions Will Be Listed Below (If Applicable).  If you need a refill on your cardiac medications before your next appointment, please call your pharmacy. Thank you for choosing [facility]!   Signed, Erma Heritage, PA-C  04/12/2018 4:24 PM    [place] [address] [place], [place] [address] Phone: [phone]

## 2018-04-13 DIAGNOSIS — N186 End stage renal disease: Secondary | ICD-10-CM | POA: Diagnosis not present

## 2018-04-13 DIAGNOSIS — Z992 Dependence on renal dialysis: Secondary | ICD-10-CM | POA: Diagnosis not present

## 2018-04-13 DIAGNOSIS — D509 Iron deficiency anemia, unspecified: Secondary | ICD-10-CM | POA: Diagnosis not present

## 2018-04-13 DIAGNOSIS — N2581 Secondary hyperparathyroidism of renal origin: Secondary | ICD-10-CM | POA: Diagnosis not present

## 2018-04-15 DIAGNOSIS — Z992 Dependence on renal dialysis: Secondary | ICD-10-CM | POA: Diagnosis not present

## 2018-04-15 DIAGNOSIS — N186 End stage renal disease: Secondary | ICD-10-CM | POA: Diagnosis not present

## 2018-04-16 DIAGNOSIS — D509 Iron deficiency anemia, unspecified: Secondary | ICD-10-CM | POA: Diagnosis not present

## 2018-04-16 DIAGNOSIS — N186 End stage renal disease: Secondary | ICD-10-CM | POA: Diagnosis not present

## 2018-04-16 DIAGNOSIS — Z992 Dependence on renal dialysis: Secondary | ICD-10-CM | POA: Diagnosis not present

## 2018-04-16 DIAGNOSIS — N2581 Secondary hyperparathyroidism of renal origin: Secondary | ICD-10-CM | POA: Diagnosis not present

## 2018-04-18 DIAGNOSIS — Z992 Dependence on renal dialysis: Secondary | ICD-10-CM | POA: Diagnosis not present

## 2018-04-18 DIAGNOSIS — N186 End stage renal disease: Secondary | ICD-10-CM | POA: Diagnosis not present

## 2018-04-18 DIAGNOSIS — N2581 Secondary hyperparathyroidism of renal origin: Secondary | ICD-10-CM | POA: Diagnosis not present

## 2018-04-18 DIAGNOSIS — D509 Iron deficiency anemia, unspecified: Secondary | ICD-10-CM | POA: Diagnosis not present

## 2018-04-20 DIAGNOSIS — Z992 Dependence on renal dialysis: Secondary | ICD-10-CM | POA: Diagnosis not present

## 2018-04-20 DIAGNOSIS — N2581 Secondary hyperparathyroidism of renal origin: Secondary | ICD-10-CM | POA: Diagnosis not present

## 2018-04-20 DIAGNOSIS — N186 End stage renal disease: Secondary | ICD-10-CM | POA: Diagnosis not present

## 2018-04-20 DIAGNOSIS — D509 Iron deficiency anemia, unspecified: Secondary | ICD-10-CM | POA: Diagnosis not present

## 2018-04-23 DIAGNOSIS — N2581 Secondary hyperparathyroidism of renal origin: Secondary | ICD-10-CM | POA: Diagnosis not present

## 2018-04-23 DIAGNOSIS — Z992 Dependence on renal dialysis: Secondary | ICD-10-CM | POA: Diagnosis not present

## 2018-04-23 DIAGNOSIS — N186 End stage renal disease: Secondary | ICD-10-CM | POA: Diagnosis not present

## 2018-04-23 DIAGNOSIS — D509 Iron deficiency anemia, unspecified: Secondary | ICD-10-CM | POA: Diagnosis not present

## 2018-04-24 DIAGNOSIS — M25561 Pain in right knee: Secondary | ICD-10-CM | POA: Diagnosis not present

## 2018-04-24 DIAGNOSIS — G894 Chronic pain syndrome: Secondary | ICD-10-CM | POA: Diagnosis not present

## 2018-04-24 DIAGNOSIS — M79606 Pain in leg, unspecified: Secondary | ICD-10-CM | POA: Diagnosis not present

## 2018-04-24 DIAGNOSIS — M47816 Spondylosis without myelopathy or radiculopathy, lumbar region: Secondary | ICD-10-CM | POA: Diagnosis not present

## 2018-04-25 DIAGNOSIS — D509 Iron deficiency anemia, unspecified: Secondary | ICD-10-CM | POA: Diagnosis not present

## 2018-04-25 DIAGNOSIS — Z992 Dependence on renal dialysis: Secondary | ICD-10-CM | POA: Diagnosis not present

## 2018-04-25 DIAGNOSIS — N2581 Secondary hyperparathyroidism of renal origin: Secondary | ICD-10-CM | POA: Diagnosis not present

## 2018-04-25 DIAGNOSIS — N186 End stage renal disease: Secondary | ICD-10-CM | POA: Diagnosis not present

## 2018-04-27 DIAGNOSIS — N2581 Secondary hyperparathyroidism of renal origin: Secondary | ICD-10-CM | POA: Diagnosis not present

## 2018-04-27 DIAGNOSIS — D509 Iron deficiency anemia, unspecified: Secondary | ICD-10-CM | POA: Diagnosis not present

## 2018-04-27 DIAGNOSIS — N186 End stage renal disease: Secondary | ICD-10-CM | POA: Diagnosis not present

## 2018-04-27 DIAGNOSIS — Z992 Dependence on renal dialysis: Secondary | ICD-10-CM | POA: Diagnosis not present

## 2018-04-30 DIAGNOSIS — Z992 Dependence on renal dialysis: Secondary | ICD-10-CM | POA: Diagnosis not present

## 2018-04-30 DIAGNOSIS — D509 Iron deficiency anemia, unspecified: Secondary | ICD-10-CM | POA: Diagnosis not present

## 2018-04-30 DIAGNOSIS — N2581 Secondary hyperparathyroidism of renal origin: Secondary | ICD-10-CM | POA: Diagnosis not present

## 2018-04-30 DIAGNOSIS — N186 End stage renal disease: Secondary | ICD-10-CM | POA: Diagnosis not present

## 2018-05-02 DIAGNOSIS — D509 Iron deficiency anemia, unspecified: Secondary | ICD-10-CM | POA: Diagnosis not present

## 2018-05-02 DIAGNOSIS — Z992 Dependence on renal dialysis: Secondary | ICD-10-CM | POA: Diagnosis not present

## 2018-05-02 DIAGNOSIS — N2581 Secondary hyperparathyroidism of renal origin: Secondary | ICD-10-CM | POA: Diagnosis not present

## 2018-05-02 DIAGNOSIS — N186 End stage renal disease: Secondary | ICD-10-CM | POA: Diagnosis not present

## 2018-05-04 DIAGNOSIS — N186 End stage renal disease: Secondary | ICD-10-CM | POA: Diagnosis not present

## 2018-05-04 DIAGNOSIS — N2581 Secondary hyperparathyroidism of renal origin: Secondary | ICD-10-CM | POA: Diagnosis not present

## 2018-05-04 DIAGNOSIS — Z992 Dependence on renal dialysis: Secondary | ICD-10-CM | POA: Diagnosis not present

## 2018-05-04 DIAGNOSIS — D509 Iron deficiency anemia, unspecified: Secondary | ICD-10-CM | POA: Diagnosis not present

## 2018-05-07 DIAGNOSIS — N2581 Secondary hyperparathyroidism of renal origin: Secondary | ICD-10-CM | POA: Diagnosis not present

## 2018-05-07 DIAGNOSIS — Z992 Dependence on renal dialysis: Secondary | ICD-10-CM | POA: Diagnosis not present

## 2018-05-07 DIAGNOSIS — N186 End stage renal disease: Secondary | ICD-10-CM | POA: Diagnosis not present

## 2018-05-07 DIAGNOSIS — D509 Iron deficiency anemia, unspecified: Secondary | ICD-10-CM | POA: Diagnosis not present

## 2018-05-09 DIAGNOSIS — Z992 Dependence on renal dialysis: Secondary | ICD-10-CM | POA: Diagnosis not present

## 2018-05-09 DIAGNOSIS — N186 End stage renal disease: Secondary | ICD-10-CM | POA: Diagnosis not present

## 2018-05-09 DIAGNOSIS — N2581 Secondary hyperparathyroidism of renal origin: Secondary | ICD-10-CM | POA: Diagnosis not present

## 2018-05-09 DIAGNOSIS — D509 Iron deficiency anemia, unspecified: Secondary | ICD-10-CM | POA: Diagnosis not present

## 2018-05-11 DIAGNOSIS — N186 End stage renal disease: Secondary | ICD-10-CM | POA: Diagnosis not present

## 2018-05-11 DIAGNOSIS — D509 Iron deficiency anemia, unspecified: Secondary | ICD-10-CM | POA: Diagnosis not present

## 2018-05-11 DIAGNOSIS — Z992 Dependence on renal dialysis: Secondary | ICD-10-CM | POA: Diagnosis not present

## 2018-05-11 DIAGNOSIS — N2581 Secondary hyperparathyroidism of renal origin: Secondary | ICD-10-CM | POA: Diagnosis not present

## 2018-05-14 DIAGNOSIS — N186 End stage renal disease: Secondary | ICD-10-CM | POA: Diagnosis not present

## 2018-05-14 DIAGNOSIS — N2581 Secondary hyperparathyroidism of renal origin: Secondary | ICD-10-CM | POA: Diagnosis not present

## 2018-05-14 DIAGNOSIS — D509 Iron deficiency anemia, unspecified: Secondary | ICD-10-CM | POA: Diagnosis not present

## 2018-05-14 DIAGNOSIS — Z992 Dependence on renal dialysis: Secondary | ICD-10-CM | POA: Diagnosis not present

## 2018-05-16 DIAGNOSIS — N2581 Secondary hyperparathyroidism of renal origin: Secondary | ICD-10-CM | POA: Diagnosis not present

## 2018-05-16 DIAGNOSIS — D509 Iron deficiency anemia, unspecified: Secondary | ICD-10-CM | POA: Diagnosis not present

## 2018-05-16 DIAGNOSIS — N186 End stage renal disease: Secondary | ICD-10-CM | POA: Diagnosis not present

## 2018-05-16 DIAGNOSIS — Z992 Dependence on renal dialysis: Secondary | ICD-10-CM | POA: Diagnosis not present

## 2018-05-18 DIAGNOSIS — D509 Iron deficiency anemia, unspecified: Secondary | ICD-10-CM | POA: Diagnosis not present

## 2018-05-18 DIAGNOSIS — Z992 Dependence on renal dialysis: Secondary | ICD-10-CM | POA: Diagnosis not present

## 2018-05-18 DIAGNOSIS — N186 End stage renal disease: Secondary | ICD-10-CM | POA: Diagnosis not present

## 2018-05-22 DIAGNOSIS — M25561 Pain in right knee: Secondary | ICD-10-CM | POA: Diagnosis not present

## 2018-05-22 DIAGNOSIS — M79606 Pain in leg, unspecified: Secondary | ICD-10-CM | POA: Diagnosis not present

## 2018-05-22 DIAGNOSIS — G894 Chronic pain syndrome: Secondary | ICD-10-CM | POA: Diagnosis not present

## 2018-05-23 DIAGNOSIS — Z992 Dependence on renal dialysis: Secondary | ICD-10-CM | POA: Diagnosis not present

## 2018-05-23 DIAGNOSIS — N186 End stage renal disease: Secondary | ICD-10-CM | POA: Diagnosis not present

## 2018-05-23 DIAGNOSIS — D509 Iron deficiency anemia, unspecified: Secondary | ICD-10-CM | POA: Diagnosis not present

## 2018-05-25 DIAGNOSIS — N186 End stage renal disease: Secondary | ICD-10-CM | POA: Diagnosis not present

## 2018-05-25 DIAGNOSIS — D509 Iron deficiency anemia, unspecified: Secondary | ICD-10-CM | POA: Diagnosis not present

## 2018-05-25 DIAGNOSIS — Z992 Dependence on renal dialysis: Secondary | ICD-10-CM | POA: Diagnosis not present

## 2018-05-28 DIAGNOSIS — N186 End stage renal disease: Secondary | ICD-10-CM | POA: Diagnosis not present

## 2018-05-28 DIAGNOSIS — D509 Iron deficiency anemia, unspecified: Secondary | ICD-10-CM | POA: Diagnosis not present

## 2018-05-28 DIAGNOSIS — Z992 Dependence on renal dialysis: Secondary | ICD-10-CM | POA: Diagnosis not present

## 2018-05-30 DIAGNOSIS — D509 Iron deficiency anemia, unspecified: Secondary | ICD-10-CM | POA: Diagnosis not present

## 2018-05-30 DIAGNOSIS — Z992 Dependence on renal dialysis: Secondary | ICD-10-CM | POA: Diagnosis not present

## 2018-05-30 DIAGNOSIS — N186 End stage renal disease: Secondary | ICD-10-CM | POA: Diagnosis not present

## 2018-06-01 DIAGNOSIS — D509 Iron deficiency anemia, unspecified: Secondary | ICD-10-CM | POA: Diagnosis not present

## 2018-06-01 DIAGNOSIS — N186 End stage renal disease: Secondary | ICD-10-CM | POA: Diagnosis not present

## 2018-06-01 DIAGNOSIS — Z992 Dependence on renal dialysis: Secondary | ICD-10-CM | POA: Diagnosis not present

## 2018-06-04 DIAGNOSIS — D509 Iron deficiency anemia, unspecified: Secondary | ICD-10-CM | POA: Diagnosis not present

## 2018-06-04 DIAGNOSIS — N186 End stage renal disease: Secondary | ICD-10-CM | POA: Diagnosis not present

## 2018-06-04 DIAGNOSIS — Z992 Dependence on renal dialysis: Secondary | ICD-10-CM | POA: Diagnosis not present

## 2018-06-06 DIAGNOSIS — Z992 Dependence on renal dialysis: Secondary | ICD-10-CM | POA: Diagnosis not present

## 2018-06-06 DIAGNOSIS — N186 End stage renal disease: Secondary | ICD-10-CM | POA: Diagnosis not present

## 2018-06-06 DIAGNOSIS — D509 Iron deficiency anemia, unspecified: Secondary | ICD-10-CM | POA: Diagnosis not present

## 2018-06-07 DIAGNOSIS — J449 Chronic obstructive pulmonary disease, unspecified: Secondary | ICD-10-CM | POA: Diagnosis not present

## 2018-06-07 DIAGNOSIS — F330 Major depressive disorder, recurrent, mild: Secondary | ICD-10-CM | POA: Diagnosis not present

## 2018-06-07 DIAGNOSIS — Z1389 Encounter for screening for other disorder: Secondary | ICD-10-CM | POA: Diagnosis not present

## 2018-06-07 DIAGNOSIS — Z0000 Encounter for general adult medical examination without abnormal findings: Secondary | ICD-10-CM | POA: Diagnosis not present

## 2018-06-07 DIAGNOSIS — N186 End stage renal disease: Secondary | ICD-10-CM | POA: Diagnosis not present

## 2018-06-07 DIAGNOSIS — E1122 Type 2 diabetes mellitus with diabetic chronic kidney disease: Secondary | ICD-10-CM | POA: Diagnosis not present

## 2018-06-07 DIAGNOSIS — I10 Essential (primary) hypertension: Secondary | ICD-10-CM | POA: Diagnosis not present

## 2018-06-07 DIAGNOSIS — J441 Chronic obstructive pulmonary disease with (acute) exacerbation: Secondary | ICD-10-CM | POA: Diagnosis not present

## 2018-06-08 DIAGNOSIS — Z992 Dependence on renal dialysis: Secondary | ICD-10-CM | POA: Diagnosis not present

## 2018-06-08 DIAGNOSIS — D509 Iron deficiency anemia, unspecified: Secondary | ICD-10-CM | POA: Diagnosis not present

## 2018-06-08 DIAGNOSIS — N186 End stage renal disease: Secondary | ICD-10-CM | POA: Diagnosis not present

## 2018-06-11 DIAGNOSIS — Z992 Dependence on renal dialysis: Secondary | ICD-10-CM | POA: Diagnosis not present

## 2018-06-11 DIAGNOSIS — N186 End stage renal disease: Secondary | ICD-10-CM | POA: Diagnosis not present

## 2018-06-11 DIAGNOSIS — D509 Iron deficiency anemia, unspecified: Secondary | ICD-10-CM | POA: Diagnosis not present

## 2018-06-13 DIAGNOSIS — Z992 Dependence on renal dialysis: Secondary | ICD-10-CM | POA: Diagnosis not present

## 2018-06-13 DIAGNOSIS — N186 End stage renal disease: Secondary | ICD-10-CM | POA: Diagnosis not present

## 2018-06-13 DIAGNOSIS — D509 Iron deficiency anemia, unspecified: Secondary | ICD-10-CM | POA: Diagnosis not present

## 2018-06-15 DIAGNOSIS — D509 Iron deficiency anemia, unspecified: Secondary | ICD-10-CM | POA: Diagnosis not present

## 2018-06-15 DIAGNOSIS — N186 End stage renal disease: Secondary | ICD-10-CM | POA: Diagnosis not present

## 2018-06-15 DIAGNOSIS — Z992 Dependence on renal dialysis: Secondary | ICD-10-CM | POA: Diagnosis not present

## 2018-06-18 DIAGNOSIS — N186 End stage renal disease: Secondary | ICD-10-CM | POA: Diagnosis not present

## 2018-06-18 DIAGNOSIS — Z23 Encounter for immunization: Secondary | ICD-10-CM | POA: Diagnosis not present

## 2018-06-18 DIAGNOSIS — Z992 Dependence on renal dialysis: Secondary | ICD-10-CM | POA: Diagnosis not present

## 2018-06-18 DIAGNOSIS — N2581 Secondary hyperparathyroidism of renal origin: Secondary | ICD-10-CM | POA: Diagnosis not present

## 2018-06-18 DIAGNOSIS — D509 Iron deficiency anemia, unspecified: Secondary | ICD-10-CM | POA: Diagnosis not present

## 2018-06-20 DIAGNOSIS — D509 Iron deficiency anemia, unspecified: Secondary | ICD-10-CM | POA: Diagnosis not present

## 2018-06-20 DIAGNOSIS — Z992 Dependence on renal dialysis: Secondary | ICD-10-CM | POA: Diagnosis not present

## 2018-06-20 DIAGNOSIS — E875 Hyperkalemia: Secondary | ICD-10-CM | POA: Diagnosis not present

## 2018-06-20 DIAGNOSIS — Z23 Encounter for immunization: Secondary | ICD-10-CM | POA: Diagnosis not present

## 2018-06-20 DIAGNOSIS — N2581 Secondary hyperparathyroidism of renal origin: Secondary | ICD-10-CM | POA: Diagnosis not present

## 2018-06-20 DIAGNOSIS — N186 End stage renal disease: Secondary | ICD-10-CM | POA: Diagnosis not present

## 2018-06-22 DIAGNOSIS — D509 Iron deficiency anemia, unspecified: Secondary | ICD-10-CM | POA: Diagnosis not present

## 2018-06-22 DIAGNOSIS — N186 End stage renal disease: Secondary | ICD-10-CM | POA: Diagnosis not present

## 2018-06-22 DIAGNOSIS — N2581 Secondary hyperparathyroidism of renal origin: Secondary | ICD-10-CM | POA: Diagnosis not present

## 2018-06-22 DIAGNOSIS — Z992 Dependence on renal dialysis: Secondary | ICD-10-CM | POA: Diagnosis not present

## 2018-06-22 DIAGNOSIS — Z23 Encounter for immunization: Secondary | ICD-10-CM | POA: Diagnosis not present

## 2018-06-25 DIAGNOSIS — Z992 Dependence on renal dialysis: Secondary | ICD-10-CM | POA: Diagnosis not present

## 2018-06-25 DIAGNOSIS — D509 Iron deficiency anemia, unspecified: Secondary | ICD-10-CM | POA: Diagnosis not present

## 2018-06-25 DIAGNOSIS — N186 End stage renal disease: Secondary | ICD-10-CM | POA: Diagnosis not present

## 2018-06-25 DIAGNOSIS — Z23 Encounter for immunization: Secondary | ICD-10-CM | POA: Diagnosis not present

## 2018-06-25 DIAGNOSIS — N2581 Secondary hyperparathyroidism of renal origin: Secondary | ICD-10-CM | POA: Diagnosis not present

## 2018-06-26 DIAGNOSIS — M79606 Pain in leg, unspecified: Secondary | ICD-10-CM | POA: Diagnosis not present

## 2018-06-26 DIAGNOSIS — M25561 Pain in right knee: Secondary | ICD-10-CM | POA: Diagnosis not present

## 2018-06-26 DIAGNOSIS — M47816 Spondylosis without myelopathy or radiculopathy, lumbar region: Secondary | ICD-10-CM | POA: Diagnosis not present

## 2018-06-26 DIAGNOSIS — G894 Chronic pain syndrome: Secondary | ICD-10-CM | POA: Diagnosis not present

## 2018-06-27 DIAGNOSIS — N186 End stage renal disease: Secondary | ICD-10-CM | POA: Diagnosis not present

## 2018-06-27 DIAGNOSIS — Z23 Encounter for immunization: Secondary | ICD-10-CM | POA: Diagnosis not present

## 2018-06-27 DIAGNOSIS — D509 Iron deficiency anemia, unspecified: Secondary | ICD-10-CM | POA: Diagnosis not present

## 2018-06-27 DIAGNOSIS — N2581 Secondary hyperparathyroidism of renal origin: Secondary | ICD-10-CM | POA: Diagnosis not present

## 2018-06-27 DIAGNOSIS — Z992 Dependence on renal dialysis: Secondary | ICD-10-CM | POA: Diagnosis not present

## 2018-06-29 DIAGNOSIS — Z23 Encounter for immunization: Secondary | ICD-10-CM | POA: Diagnosis not present

## 2018-06-29 DIAGNOSIS — N2581 Secondary hyperparathyroidism of renal origin: Secondary | ICD-10-CM | POA: Diagnosis not present

## 2018-06-29 DIAGNOSIS — D509 Iron deficiency anemia, unspecified: Secondary | ICD-10-CM | POA: Diagnosis not present

## 2018-06-29 DIAGNOSIS — Z992 Dependence on renal dialysis: Secondary | ICD-10-CM | POA: Diagnosis not present

## 2018-06-29 DIAGNOSIS — N186 End stage renal disease: Secondary | ICD-10-CM | POA: Diagnosis not present

## 2018-07-02 ENCOUNTER — Other Ambulatory Visit: Payer: Self-pay

## 2018-07-02 DIAGNOSIS — I771 Stricture of artery: Secondary | ICD-10-CM

## 2018-07-02 DIAGNOSIS — T82510A Breakdown (mechanical) of surgically created arteriovenous fistula, initial encounter: Secondary | ICD-10-CM

## 2018-07-02 DIAGNOSIS — D509 Iron deficiency anemia, unspecified: Secondary | ICD-10-CM | POA: Diagnosis not present

## 2018-07-02 DIAGNOSIS — Z23 Encounter for immunization: Secondary | ICD-10-CM | POA: Diagnosis not present

## 2018-07-02 DIAGNOSIS — Z992 Dependence on renal dialysis: Secondary | ICD-10-CM | POA: Diagnosis not present

## 2018-07-02 DIAGNOSIS — N186 End stage renal disease: Secondary | ICD-10-CM | POA: Diagnosis not present

## 2018-07-02 DIAGNOSIS — N2581 Secondary hyperparathyroidism of renal origin: Secondary | ICD-10-CM | POA: Diagnosis not present

## 2018-07-04 DIAGNOSIS — N186 End stage renal disease: Secondary | ICD-10-CM | POA: Diagnosis not present

## 2018-07-04 DIAGNOSIS — N2581 Secondary hyperparathyroidism of renal origin: Secondary | ICD-10-CM | POA: Diagnosis not present

## 2018-07-04 DIAGNOSIS — D509 Iron deficiency anemia, unspecified: Secondary | ICD-10-CM | POA: Diagnosis not present

## 2018-07-04 DIAGNOSIS — Z992 Dependence on renal dialysis: Secondary | ICD-10-CM | POA: Diagnosis not present

## 2018-07-04 DIAGNOSIS — Z23 Encounter for immunization: Secondary | ICD-10-CM | POA: Diagnosis not present

## 2018-07-06 DIAGNOSIS — Z23 Encounter for immunization: Secondary | ICD-10-CM | POA: Diagnosis not present

## 2018-07-06 DIAGNOSIS — N2581 Secondary hyperparathyroidism of renal origin: Secondary | ICD-10-CM | POA: Diagnosis not present

## 2018-07-06 DIAGNOSIS — N186 End stage renal disease: Secondary | ICD-10-CM | POA: Diagnosis not present

## 2018-07-06 DIAGNOSIS — D509 Iron deficiency anemia, unspecified: Secondary | ICD-10-CM | POA: Diagnosis not present

## 2018-07-06 DIAGNOSIS — Z992 Dependence on renal dialysis: Secondary | ICD-10-CM | POA: Diagnosis not present

## 2018-07-09 DIAGNOSIS — N186 End stage renal disease: Secondary | ICD-10-CM | POA: Diagnosis not present

## 2018-07-09 DIAGNOSIS — Z23 Encounter for immunization: Secondary | ICD-10-CM | POA: Diagnosis not present

## 2018-07-09 DIAGNOSIS — E109 Type 1 diabetes mellitus without complications: Secondary | ICD-10-CM | POA: Diagnosis not present

## 2018-07-09 DIAGNOSIS — Z794 Long term (current) use of insulin: Secondary | ICD-10-CM | POA: Diagnosis not present

## 2018-07-09 DIAGNOSIS — N2581 Secondary hyperparathyroidism of renal origin: Secondary | ICD-10-CM | POA: Diagnosis not present

## 2018-07-09 DIAGNOSIS — D509 Iron deficiency anemia, unspecified: Secondary | ICD-10-CM | POA: Diagnosis not present

## 2018-07-09 DIAGNOSIS — Z992 Dependence on renal dialysis: Secondary | ICD-10-CM | POA: Diagnosis not present

## 2018-07-11 DIAGNOSIS — D509 Iron deficiency anemia, unspecified: Secondary | ICD-10-CM | POA: Diagnosis not present

## 2018-07-11 DIAGNOSIS — N2581 Secondary hyperparathyroidism of renal origin: Secondary | ICD-10-CM | POA: Diagnosis not present

## 2018-07-11 DIAGNOSIS — Z23 Encounter for immunization: Secondary | ICD-10-CM | POA: Diagnosis not present

## 2018-07-11 DIAGNOSIS — N186 End stage renal disease: Secondary | ICD-10-CM | POA: Diagnosis not present

## 2018-07-11 DIAGNOSIS — Z992 Dependence on renal dialysis: Secondary | ICD-10-CM | POA: Diagnosis not present

## 2018-07-12 DIAGNOSIS — Z79891 Long term (current) use of opiate analgesic: Secondary | ICD-10-CM | POA: Diagnosis not present

## 2018-07-12 DIAGNOSIS — G894 Chronic pain syndrome: Secondary | ICD-10-CM | POA: Diagnosis not present

## 2018-07-13 DIAGNOSIS — D509 Iron deficiency anemia, unspecified: Secondary | ICD-10-CM | POA: Diagnosis not present

## 2018-07-13 DIAGNOSIS — Z23 Encounter for immunization: Secondary | ICD-10-CM | POA: Diagnosis not present

## 2018-07-13 DIAGNOSIS — N186 End stage renal disease: Secondary | ICD-10-CM | POA: Diagnosis not present

## 2018-07-13 DIAGNOSIS — Z992 Dependence on renal dialysis: Secondary | ICD-10-CM | POA: Diagnosis not present

## 2018-07-13 DIAGNOSIS — N2581 Secondary hyperparathyroidism of renal origin: Secondary | ICD-10-CM | POA: Diagnosis not present

## 2018-07-16 DIAGNOSIS — Z23 Encounter for immunization: Secondary | ICD-10-CM | POA: Diagnosis not present

## 2018-07-16 DIAGNOSIS — N186 End stage renal disease: Secondary | ICD-10-CM | POA: Diagnosis not present

## 2018-07-16 DIAGNOSIS — N2581 Secondary hyperparathyroidism of renal origin: Secondary | ICD-10-CM | POA: Diagnosis not present

## 2018-07-16 DIAGNOSIS — Z992 Dependence on renal dialysis: Secondary | ICD-10-CM | POA: Diagnosis not present

## 2018-07-16 DIAGNOSIS — D509 Iron deficiency anemia, unspecified: Secondary | ICD-10-CM | POA: Diagnosis not present

## 2018-07-17 ENCOUNTER — Ambulatory Visit: Payer: Medicare Other | Admitting: Cardiovascular Disease

## 2018-07-18 DIAGNOSIS — N2581 Secondary hyperparathyroidism of renal origin: Secondary | ICD-10-CM | POA: Diagnosis not present

## 2018-07-18 DIAGNOSIS — N186 End stage renal disease: Secondary | ICD-10-CM | POA: Diagnosis not present

## 2018-07-18 DIAGNOSIS — Z23 Encounter for immunization: Secondary | ICD-10-CM | POA: Diagnosis not present

## 2018-07-18 DIAGNOSIS — Z992 Dependence on renal dialysis: Secondary | ICD-10-CM | POA: Diagnosis not present

## 2018-07-18 DIAGNOSIS — D509 Iron deficiency anemia, unspecified: Secondary | ICD-10-CM | POA: Diagnosis not present

## 2018-07-19 ENCOUNTER — Encounter: Payer: Self-pay | Admitting: Cardiovascular Disease

## 2018-07-19 DIAGNOSIS — E1122 Type 2 diabetes mellitus with diabetic chronic kidney disease: Secondary | ICD-10-CM | POA: Diagnosis not present

## 2018-07-19 DIAGNOSIS — N186 End stage renal disease: Secondary | ICD-10-CM | POA: Diagnosis not present

## 2018-07-19 DIAGNOSIS — F330 Major depressive disorder, recurrent, mild: Secondary | ICD-10-CM | POA: Diagnosis not present

## 2018-07-19 DIAGNOSIS — I10 Essential (primary) hypertension: Secondary | ICD-10-CM | POA: Diagnosis not present

## 2018-07-19 DIAGNOSIS — J449 Chronic obstructive pulmonary disease, unspecified: Secondary | ICD-10-CM | POA: Diagnosis not present

## 2018-07-20 DIAGNOSIS — N2581 Secondary hyperparathyroidism of renal origin: Secondary | ICD-10-CM | POA: Diagnosis not present

## 2018-07-20 DIAGNOSIS — N186 End stage renal disease: Secondary | ICD-10-CM | POA: Diagnosis not present

## 2018-07-20 DIAGNOSIS — D509 Iron deficiency anemia, unspecified: Secondary | ICD-10-CM | POA: Diagnosis not present

## 2018-07-20 DIAGNOSIS — Z992 Dependence on renal dialysis: Secondary | ICD-10-CM | POA: Diagnosis not present

## 2018-07-20 DIAGNOSIS — Z23 Encounter for immunization: Secondary | ICD-10-CM | POA: Diagnosis not present

## 2018-07-23 DIAGNOSIS — D509 Iron deficiency anemia, unspecified: Secondary | ICD-10-CM | POA: Diagnosis not present

## 2018-07-23 DIAGNOSIS — N186 End stage renal disease: Secondary | ICD-10-CM | POA: Diagnosis not present

## 2018-07-23 DIAGNOSIS — N2581 Secondary hyperparathyroidism of renal origin: Secondary | ICD-10-CM | POA: Diagnosis not present

## 2018-07-23 DIAGNOSIS — Z23 Encounter for immunization: Secondary | ICD-10-CM | POA: Diagnosis not present

## 2018-07-23 DIAGNOSIS — Z992 Dependence on renal dialysis: Secondary | ICD-10-CM | POA: Diagnosis not present

## 2018-07-24 DIAGNOSIS — M79606 Pain in leg, unspecified: Secondary | ICD-10-CM | POA: Diagnosis not present

## 2018-07-24 DIAGNOSIS — G894 Chronic pain syndrome: Secondary | ICD-10-CM | POA: Diagnosis not present

## 2018-07-24 DIAGNOSIS — M25561 Pain in right knee: Secondary | ICD-10-CM | POA: Diagnosis not present

## 2018-07-25 DIAGNOSIS — Z992 Dependence on renal dialysis: Secondary | ICD-10-CM | POA: Diagnosis not present

## 2018-07-25 DIAGNOSIS — N186 End stage renal disease: Secondary | ICD-10-CM | POA: Diagnosis not present

## 2018-07-25 DIAGNOSIS — D509 Iron deficiency anemia, unspecified: Secondary | ICD-10-CM | POA: Diagnosis not present

## 2018-07-25 DIAGNOSIS — Z23 Encounter for immunization: Secondary | ICD-10-CM | POA: Diagnosis not present

## 2018-07-25 DIAGNOSIS — N2581 Secondary hyperparathyroidism of renal origin: Secondary | ICD-10-CM | POA: Diagnosis not present

## 2018-07-27 DIAGNOSIS — Z992 Dependence on renal dialysis: Secondary | ICD-10-CM | POA: Diagnosis not present

## 2018-07-27 DIAGNOSIS — N2581 Secondary hyperparathyroidism of renal origin: Secondary | ICD-10-CM | POA: Diagnosis not present

## 2018-07-27 DIAGNOSIS — Z23 Encounter for immunization: Secondary | ICD-10-CM | POA: Diagnosis not present

## 2018-07-27 DIAGNOSIS — D509 Iron deficiency anemia, unspecified: Secondary | ICD-10-CM | POA: Diagnosis not present

## 2018-07-27 DIAGNOSIS — N186 End stage renal disease: Secondary | ICD-10-CM | POA: Diagnosis not present

## 2018-07-30 DIAGNOSIS — N2581 Secondary hyperparathyroidism of renal origin: Secondary | ICD-10-CM | POA: Diagnosis not present

## 2018-07-30 DIAGNOSIS — Z23 Encounter for immunization: Secondary | ICD-10-CM | POA: Diagnosis not present

## 2018-07-30 DIAGNOSIS — Z992 Dependence on renal dialysis: Secondary | ICD-10-CM | POA: Diagnosis not present

## 2018-07-30 DIAGNOSIS — N186 End stage renal disease: Secondary | ICD-10-CM | POA: Diagnosis not present

## 2018-07-30 DIAGNOSIS — D509 Iron deficiency anemia, unspecified: Secondary | ICD-10-CM | POA: Diagnosis not present

## 2018-07-31 DIAGNOSIS — R269 Unspecified abnormalities of gait and mobility: Secondary | ICD-10-CM | POA: Diagnosis not present

## 2018-07-31 DIAGNOSIS — Z89511 Acquired absence of right leg below knee: Secondary | ICD-10-CM | POA: Diagnosis not present

## 2018-07-31 DIAGNOSIS — R531 Weakness: Secondary | ICD-10-CM | POA: Diagnosis not present

## 2018-07-31 DIAGNOSIS — R262 Difficulty in walking, not elsewhere classified: Secondary | ICD-10-CM | POA: Diagnosis not present

## 2018-07-31 DIAGNOSIS — Z89512 Acquired absence of left leg below knee: Secondary | ICD-10-CM | POA: Diagnosis not present

## 2018-08-01 DIAGNOSIS — N186 End stage renal disease: Secondary | ICD-10-CM | POA: Diagnosis not present

## 2018-08-01 DIAGNOSIS — N2581 Secondary hyperparathyroidism of renal origin: Secondary | ICD-10-CM | POA: Diagnosis not present

## 2018-08-01 DIAGNOSIS — Z992 Dependence on renal dialysis: Secondary | ICD-10-CM | POA: Diagnosis not present

## 2018-08-01 DIAGNOSIS — Z23 Encounter for immunization: Secondary | ICD-10-CM | POA: Diagnosis not present

## 2018-08-01 DIAGNOSIS — D509 Iron deficiency anemia, unspecified: Secondary | ICD-10-CM | POA: Diagnosis not present

## 2018-08-03 DIAGNOSIS — D509 Iron deficiency anemia, unspecified: Secondary | ICD-10-CM | POA: Diagnosis not present

## 2018-08-03 DIAGNOSIS — N2581 Secondary hyperparathyroidism of renal origin: Secondary | ICD-10-CM | POA: Diagnosis not present

## 2018-08-03 DIAGNOSIS — Z23 Encounter for immunization: Secondary | ICD-10-CM | POA: Diagnosis not present

## 2018-08-03 DIAGNOSIS — N186 End stage renal disease: Secondary | ICD-10-CM | POA: Diagnosis not present

## 2018-08-03 DIAGNOSIS — Z992 Dependence on renal dialysis: Secondary | ICD-10-CM | POA: Diagnosis not present

## 2018-08-06 DIAGNOSIS — Z23 Encounter for immunization: Secondary | ICD-10-CM | POA: Diagnosis not present

## 2018-08-06 DIAGNOSIS — N2581 Secondary hyperparathyroidism of renal origin: Secondary | ICD-10-CM | POA: Diagnosis not present

## 2018-08-06 DIAGNOSIS — Z992 Dependence on renal dialysis: Secondary | ICD-10-CM | POA: Diagnosis not present

## 2018-08-06 DIAGNOSIS — N186 End stage renal disease: Secondary | ICD-10-CM | POA: Diagnosis not present

## 2018-08-06 DIAGNOSIS — D509 Iron deficiency anemia, unspecified: Secondary | ICD-10-CM | POA: Diagnosis not present

## 2018-08-07 DIAGNOSIS — Z89511 Acquired absence of right leg below knee: Secondary | ICD-10-CM | POA: Diagnosis not present

## 2018-08-07 DIAGNOSIS — R531 Weakness: Secondary | ICD-10-CM | POA: Diagnosis not present

## 2018-08-07 DIAGNOSIS — Z89512 Acquired absence of left leg below knee: Secondary | ICD-10-CM | POA: Diagnosis not present

## 2018-08-07 DIAGNOSIS — R269 Unspecified abnormalities of gait and mobility: Secondary | ICD-10-CM | POA: Diagnosis not present

## 2018-08-07 DIAGNOSIS — R262 Difficulty in walking, not elsewhere classified: Secondary | ICD-10-CM | POA: Diagnosis not present

## 2018-08-08 DIAGNOSIS — Z992 Dependence on renal dialysis: Secondary | ICD-10-CM | POA: Diagnosis not present

## 2018-08-08 DIAGNOSIS — N186 End stage renal disease: Secondary | ICD-10-CM | POA: Diagnosis not present

## 2018-08-08 DIAGNOSIS — Z23 Encounter for immunization: Secondary | ICD-10-CM | POA: Diagnosis not present

## 2018-08-08 DIAGNOSIS — D509 Iron deficiency anemia, unspecified: Secondary | ICD-10-CM | POA: Diagnosis not present

## 2018-08-08 DIAGNOSIS — N2581 Secondary hyperparathyroidism of renal origin: Secondary | ICD-10-CM | POA: Diagnosis not present

## 2018-08-10 DIAGNOSIS — D509 Iron deficiency anemia, unspecified: Secondary | ICD-10-CM | POA: Diagnosis not present

## 2018-08-10 DIAGNOSIS — N186 End stage renal disease: Secondary | ICD-10-CM | POA: Diagnosis not present

## 2018-08-10 DIAGNOSIS — Z23 Encounter for immunization: Secondary | ICD-10-CM | POA: Diagnosis not present

## 2018-08-10 DIAGNOSIS — N2581 Secondary hyperparathyroidism of renal origin: Secondary | ICD-10-CM | POA: Diagnosis not present

## 2018-08-10 DIAGNOSIS — Z992 Dependence on renal dialysis: Secondary | ICD-10-CM | POA: Diagnosis not present

## 2018-08-13 DIAGNOSIS — N2581 Secondary hyperparathyroidism of renal origin: Secondary | ICD-10-CM | POA: Diagnosis not present

## 2018-08-13 DIAGNOSIS — D509 Iron deficiency anemia, unspecified: Secondary | ICD-10-CM | POA: Diagnosis not present

## 2018-08-13 DIAGNOSIS — N186 End stage renal disease: Secondary | ICD-10-CM | POA: Diagnosis not present

## 2018-08-13 DIAGNOSIS — Z992 Dependence on renal dialysis: Secondary | ICD-10-CM | POA: Diagnosis not present

## 2018-08-13 DIAGNOSIS — Z23 Encounter for immunization: Secondary | ICD-10-CM | POA: Diagnosis not present

## 2018-08-15 DIAGNOSIS — Z89512 Acquired absence of left leg below knee: Secondary | ICD-10-CM | POA: Diagnosis not present

## 2018-08-15 DIAGNOSIS — R269 Unspecified abnormalities of gait and mobility: Secondary | ICD-10-CM | POA: Diagnosis not present

## 2018-08-15 DIAGNOSIS — N2581 Secondary hyperparathyroidism of renal origin: Secondary | ICD-10-CM | POA: Diagnosis not present

## 2018-08-15 DIAGNOSIS — Z23 Encounter for immunization: Secondary | ICD-10-CM | POA: Diagnosis not present

## 2018-08-15 DIAGNOSIS — N186 End stage renal disease: Secondary | ICD-10-CM | POA: Diagnosis not present

## 2018-08-15 DIAGNOSIS — R262 Difficulty in walking, not elsewhere classified: Secondary | ICD-10-CM | POA: Diagnosis not present

## 2018-08-15 DIAGNOSIS — Z89511 Acquired absence of right leg below knee: Secondary | ICD-10-CM | POA: Diagnosis not present

## 2018-08-15 DIAGNOSIS — D509 Iron deficiency anemia, unspecified: Secondary | ICD-10-CM | POA: Diagnosis not present

## 2018-08-15 DIAGNOSIS — R531 Weakness: Secondary | ICD-10-CM | POA: Diagnosis not present

## 2018-08-15 DIAGNOSIS — Z992 Dependence on renal dialysis: Secondary | ICD-10-CM | POA: Diagnosis not present

## 2018-08-16 DIAGNOSIS — R269 Unspecified abnormalities of gait and mobility: Secondary | ICD-10-CM | POA: Diagnosis not present

## 2018-08-16 DIAGNOSIS — R531 Weakness: Secondary | ICD-10-CM | POA: Diagnosis not present

## 2018-08-16 DIAGNOSIS — Z89512 Acquired absence of left leg below knee: Secondary | ICD-10-CM | POA: Diagnosis not present

## 2018-08-16 DIAGNOSIS — Z992 Dependence on renal dialysis: Secondary | ICD-10-CM | POA: Diagnosis not present

## 2018-08-16 DIAGNOSIS — N186 End stage renal disease: Secondary | ICD-10-CM | POA: Diagnosis not present

## 2018-08-16 DIAGNOSIS — R262 Difficulty in walking, not elsewhere classified: Secondary | ICD-10-CM | POA: Diagnosis not present

## 2018-08-16 DIAGNOSIS — Z89511 Acquired absence of right leg below knee: Secondary | ICD-10-CM | POA: Diagnosis not present

## 2018-08-17 DIAGNOSIS — D509 Iron deficiency anemia, unspecified: Secondary | ICD-10-CM | POA: Diagnosis not present

## 2018-08-17 DIAGNOSIS — Z992 Dependence on renal dialysis: Secondary | ICD-10-CM | POA: Diagnosis not present

## 2018-08-17 DIAGNOSIS — N2581 Secondary hyperparathyroidism of renal origin: Secondary | ICD-10-CM | POA: Diagnosis not present

## 2018-08-17 DIAGNOSIS — N186 End stage renal disease: Secondary | ICD-10-CM | POA: Diagnosis not present

## 2018-08-20 DIAGNOSIS — N186 End stage renal disease: Secondary | ICD-10-CM | POA: Diagnosis not present

## 2018-08-20 DIAGNOSIS — Z992 Dependence on renal dialysis: Secondary | ICD-10-CM | POA: Diagnosis not present

## 2018-08-20 DIAGNOSIS — D509 Iron deficiency anemia, unspecified: Secondary | ICD-10-CM | POA: Diagnosis not present

## 2018-08-20 DIAGNOSIS — N2581 Secondary hyperparathyroidism of renal origin: Secondary | ICD-10-CM | POA: Diagnosis not present

## 2018-08-21 ENCOUNTER — Encounter (HOSPITAL_COMMUNITY): Payer: Medicare Other

## 2018-08-21 ENCOUNTER — Ambulatory Visit: Payer: Medicare Other | Admitting: Vascular Surgery

## 2018-08-21 DIAGNOSIS — G894 Chronic pain syndrome: Secondary | ICD-10-CM | POA: Diagnosis not present

## 2018-08-21 DIAGNOSIS — M169 Osteoarthritis of hip, unspecified: Secondary | ICD-10-CM | POA: Diagnosis not present

## 2018-08-21 DIAGNOSIS — M47816 Spondylosis without myelopathy or radiculopathy, lumbar region: Secondary | ICD-10-CM | POA: Diagnosis not present

## 2018-08-21 DIAGNOSIS — M25561 Pain in right knee: Secondary | ICD-10-CM | POA: Diagnosis not present

## 2018-08-22 DIAGNOSIS — N186 End stage renal disease: Secondary | ICD-10-CM | POA: Diagnosis not present

## 2018-08-22 DIAGNOSIS — D509 Iron deficiency anemia, unspecified: Secondary | ICD-10-CM | POA: Diagnosis not present

## 2018-08-22 DIAGNOSIS — Z992 Dependence on renal dialysis: Secondary | ICD-10-CM | POA: Diagnosis not present

## 2018-08-22 DIAGNOSIS — N2581 Secondary hyperparathyroidism of renal origin: Secondary | ICD-10-CM | POA: Diagnosis not present

## 2018-08-24 DIAGNOSIS — N186 End stage renal disease: Secondary | ICD-10-CM | POA: Diagnosis not present

## 2018-08-24 DIAGNOSIS — Z992 Dependence on renal dialysis: Secondary | ICD-10-CM | POA: Diagnosis not present

## 2018-08-24 DIAGNOSIS — N2581 Secondary hyperparathyroidism of renal origin: Secondary | ICD-10-CM | POA: Diagnosis not present

## 2018-08-24 DIAGNOSIS — D509 Iron deficiency anemia, unspecified: Secondary | ICD-10-CM | POA: Diagnosis not present

## 2018-08-27 DIAGNOSIS — D509 Iron deficiency anemia, unspecified: Secondary | ICD-10-CM | POA: Diagnosis not present

## 2018-08-27 DIAGNOSIS — N186 End stage renal disease: Secondary | ICD-10-CM | POA: Diagnosis not present

## 2018-08-27 DIAGNOSIS — N2581 Secondary hyperparathyroidism of renal origin: Secondary | ICD-10-CM | POA: Diagnosis not present

## 2018-08-27 DIAGNOSIS — Z992 Dependence on renal dialysis: Secondary | ICD-10-CM | POA: Diagnosis not present

## 2018-08-29 DIAGNOSIS — N186 End stage renal disease: Secondary | ICD-10-CM | POA: Diagnosis not present

## 2018-08-29 DIAGNOSIS — N2581 Secondary hyperparathyroidism of renal origin: Secondary | ICD-10-CM | POA: Diagnosis not present

## 2018-08-29 DIAGNOSIS — D509 Iron deficiency anemia, unspecified: Secondary | ICD-10-CM | POA: Diagnosis not present

## 2018-08-29 DIAGNOSIS — Z992 Dependence on renal dialysis: Secondary | ICD-10-CM | POA: Diagnosis not present

## 2018-08-31 DIAGNOSIS — Z992 Dependence on renal dialysis: Secondary | ICD-10-CM | POA: Diagnosis not present

## 2018-08-31 DIAGNOSIS — D509 Iron deficiency anemia, unspecified: Secondary | ICD-10-CM | POA: Diagnosis not present

## 2018-08-31 DIAGNOSIS — N186 End stage renal disease: Secondary | ICD-10-CM | POA: Diagnosis not present

## 2018-08-31 DIAGNOSIS — N2581 Secondary hyperparathyroidism of renal origin: Secondary | ICD-10-CM | POA: Diagnosis not present

## 2018-09-03 DIAGNOSIS — N186 End stage renal disease: Secondary | ICD-10-CM | POA: Diagnosis not present

## 2018-09-03 DIAGNOSIS — N2581 Secondary hyperparathyroidism of renal origin: Secondary | ICD-10-CM | POA: Diagnosis not present

## 2018-09-03 DIAGNOSIS — Z992 Dependence on renal dialysis: Secondary | ICD-10-CM | POA: Diagnosis not present

## 2018-09-03 DIAGNOSIS — D509 Iron deficiency anemia, unspecified: Secondary | ICD-10-CM | POA: Diagnosis not present

## 2018-09-04 DIAGNOSIS — Z89512 Acquired absence of left leg below knee: Secondary | ICD-10-CM | POA: Diagnosis not present

## 2018-09-04 DIAGNOSIS — R269 Unspecified abnormalities of gait and mobility: Secondary | ICD-10-CM | POA: Diagnosis not present

## 2018-09-04 DIAGNOSIS — R262 Difficulty in walking, not elsewhere classified: Secondary | ICD-10-CM | POA: Diagnosis not present

## 2018-09-04 DIAGNOSIS — R531 Weakness: Secondary | ICD-10-CM | POA: Diagnosis not present

## 2018-09-04 DIAGNOSIS — Z89511 Acquired absence of right leg below knee: Secondary | ICD-10-CM | POA: Diagnosis not present

## 2018-09-04 DIAGNOSIS — M545 Low back pain: Secondary | ICD-10-CM | POA: Diagnosis not present

## 2018-09-05 DIAGNOSIS — N186 End stage renal disease: Secondary | ICD-10-CM | POA: Diagnosis not present

## 2018-09-05 DIAGNOSIS — N2581 Secondary hyperparathyroidism of renal origin: Secondary | ICD-10-CM | POA: Diagnosis not present

## 2018-09-05 DIAGNOSIS — Z992 Dependence on renal dialysis: Secondary | ICD-10-CM | POA: Diagnosis not present

## 2018-09-05 DIAGNOSIS — D509 Iron deficiency anemia, unspecified: Secondary | ICD-10-CM | POA: Diagnosis not present

## 2018-09-06 DIAGNOSIS — Z89511 Acquired absence of right leg below knee: Secondary | ICD-10-CM | POA: Diagnosis not present

## 2018-09-06 DIAGNOSIS — M545 Low back pain: Secondary | ICD-10-CM | POA: Diagnosis not present

## 2018-09-06 DIAGNOSIS — Z89512 Acquired absence of left leg below knee: Secondary | ICD-10-CM | POA: Diagnosis not present

## 2018-09-06 DIAGNOSIS — R269 Unspecified abnormalities of gait and mobility: Secondary | ICD-10-CM | POA: Diagnosis not present

## 2018-09-06 DIAGNOSIS — R262 Difficulty in walking, not elsewhere classified: Secondary | ICD-10-CM | POA: Diagnosis not present

## 2018-09-06 DIAGNOSIS — R531 Weakness: Secondary | ICD-10-CM | POA: Diagnosis not present

## 2018-09-07 DIAGNOSIS — Z992 Dependence on renal dialysis: Secondary | ICD-10-CM | POA: Diagnosis not present

## 2018-09-07 DIAGNOSIS — D509 Iron deficiency anemia, unspecified: Secondary | ICD-10-CM | POA: Diagnosis not present

## 2018-09-07 DIAGNOSIS — N2581 Secondary hyperparathyroidism of renal origin: Secondary | ICD-10-CM | POA: Diagnosis not present

## 2018-09-07 DIAGNOSIS — N186 End stage renal disease: Secondary | ICD-10-CM | POA: Diagnosis not present

## 2018-09-10 DIAGNOSIS — N2581 Secondary hyperparathyroidism of renal origin: Secondary | ICD-10-CM | POA: Diagnosis not present

## 2018-09-10 DIAGNOSIS — Z992 Dependence on renal dialysis: Secondary | ICD-10-CM | POA: Diagnosis not present

## 2018-09-10 DIAGNOSIS — N186 End stage renal disease: Secondary | ICD-10-CM | POA: Diagnosis not present

## 2018-09-10 DIAGNOSIS — D509 Iron deficiency anemia, unspecified: Secondary | ICD-10-CM | POA: Diagnosis not present

## 2018-09-12 DIAGNOSIS — N186 End stage renal disease: Secondary | ICD-10-CM | POA: Diagnosis not present

## 2018-09-12 DIAGNOSIS — N2581 Secondary hyperparathyroidism of renal origin: Secondary | ICD-10-CM | POA: Diagnosis not present

## 2018-09-12 DIAGNOSIS — Z992 Dependence on renal dialysis: Secondary | ICD-10-CM | POA: Diagnosis not present

## 2018-09-12 DIAGNOSIS — D509 Iron deficiency anemia, unspecified: Secondary | ICD-10-CM | POA: Diagnosis not present

## 2018-09-14 DIAGNOSIS — N186 End stage renal disease: Secondary | ICD-10-CM | POA: Diagnosis not present

## 2018-09-14 DIAGNOSIS — D509 Iron deficiency anemia, unspecified: Secondary | ICD-10-CM | POA: Diagnosis not present

## 2018-09-14 DIAGNOSIS — Z992 Dependence on renal dialysis: Secondary | ICD-10-CM | POA: Diagnosis not present

## 2018-09-14 DIAGNOSIS — Z1159 Encounter for screening for other viral diseases: Secondary | ICD-10-CM | POA: Diagnosis not present

## 2018-09-14 DIAGNOSIS — N2581 Secondary hyperparathyroidism of renal origin: Secondary | ICD-10-CM | POA: Diagnosis not present

## 2018-09-15 DIAGNOSIS — N186 End stage renal disease: Secondary | ICD-10-CM | POA: Diagnosis not present

## 2018-09-15 DIAGNOSIS — Z992 Dependence on renal dialysis: Secondary | ICD-10-CM | POA: Diagnosis not present

## 2018-09-17 DIAGNOSIS — J449 Chronic obstructive pulmonary disease, unspecified: Secondary | ICD-10-CM | POA: Diagnosis not present

## 2018-09-17 DIAGNOSIS — F330 Major depressive disorder, recurrent, mild: Secondary | ICD-10-CM | POA: Diagnosis not present

## 2018-09-17 DIAGNOSIS — N2581 Secondary hyperparathyroidism of renal origin: Secondary | ICD-10-CM | POA: Diagnosis not present

## 2018-09-17 DIAGNOSIS — Z992 Dependence on renal dialysis: Secondary | ICD-10-CM | POA: Diagnosis not present

## 2018-09-17 DIAGNOSIS — D509 Iron deficiency anemia, unspecified: Secondary | ICD-10-CM | POA: Diagnosis not present

## 2018-09-17 DIAGNOSIS — E1122 Type 2 diabetes mellitus with diabetic chronic kidney disease: Secondary | ICD-10-CM | POA: Diagnosis not present

## 2018-09-17 DIAGNOSIS — N186 End stage renal disease: Secondary | ICD-10-CM | POA: Diagnosis not present

## 2018-09-17 DIAGNOSIS — T8744 Infection of amputation stump, left lower extremity: Secondary | ICD-10-CM | POA: Diagnosis not present

## 2018-09-17 DIAGNOSIS — I10 Essential (primary) hypertension: Secondary | ICD-10-CM | POA: Diagnosis not present

## 2018-09-19 DIAGNOSIS — D509 Iron deficiency anemia, unspecified: Secondary | ICD-10-CM | POA: Diagnosis not present

## 2018-09-19 DIAGNOSIS — T8744 Infection of amputation stump, left lower extremity: Secondary | ICD-10-CM | POA: Diagnosis not present

## 2018-09-19 DIAGNOSIS — N2581 Secondary hyperparathyroidism of renal origin: Secondary | ICD-10-CM | POA: Diagnosis not present

## 2018-09-19 DIAGNOSIS — N186 End stage renal disease: Secondary | ICD-10-CM | POA: Diagnosis not present

## 2018-09-19 DIAGNOSIS — Z992 Dependence on renal dialysis: Secondary | ICD-10-CM | POA: Diagnosis not present

## 2018-09-20 DIAGNOSIS — M25561 Pain in right knee: Secondary | ICD-10-CM | POA: Diagnosis not present

## 2018-09-20 DIAGNOSIS — G894 Chronic pain syndrome: Secondary | ICD-10-CM | POA: Diagnosis not present

## 2018-09-20 DIAGNOSIS — M47816 Spondylosis without myelopathy or radiculopathy, lumbar region: Secondary | ICD-10-CM | POA: Diagnosis not present

## 2018-09-20 DIAGNOSIS — M79606 Pain in leg, unspecified: Secondary | ICD-10-CM | POA: Diagnosis not present

## 2018-09-21 DIAGNOSIS — N2581 Secondary hyperparathyroidism of renal origin: Secondary | ICD-10-CM | POA: Diagnosis not present

## 2018-09-21 DIAGNOSIS — D509 Iron deficiency anemia, unspecified: Secondary | ICD-10-CM | POA: Diagnosis not present

## 2018-09-21 DIAGNOSIS — N186 End stage renal disease: Secondary | ICD-10-CM | POA: Diagnosis not present

## 2018-09-21 DIAGNOSIS — T8744 Infection of amputation stump, left lower extremity: Secondary | ICD-10-CM | POA: Diagnosis not present

## 2018-09-21 DIAGNOSIS — Z992 Dependence on renal dialysis: Secondary | ICD-10-CM | POA: Diagnosis not present

## 2018-09-24 DIAGNOSIS — T8744 Infection of amputation stump, left lower extremity: Secondary | ICD-10-CM | POA: Diagnosis not present

## 2018-09-24 DIAGNOSIS — N2581 Secondary hyperparathyroidism of renal origin: Secondary | ICD-10-CM | POA: Diagnosis not present

## 2018-09-24 DIAGNOSIS — D509 Iron deficiency anemia, unspecified: Secondary | ICD-10-CM | POA: Diagnosis not present

## 2018-09-24 DIAGNOSIS — Z992 Dependence on renal dialysis: Secondary | ICD-10-CM | POA: Diagnosis not present

## 2018-09-24 DIAGNOSIS — N186 End stage renal disease: Secondary | ICD-10-CM | POA: Diagnosis not present

## 2018-09-26 DIAGNOSIS — I10 Essential (primary) hypertension: Secondary | ICD-10-CM | POA: Diagnosis not present

## 2018-09-26 DIAGNOSIS — D509 Iron deficiency anemia, unspecified: Secondary | ICD-10-CM | POA: Diagnosis not present

## 2018-09-26 DIAGNOSIS — E1122 Type 2 diabetes mellitus with diabetic chronic kidney disease: Secondary | ICD-10-CM | POA: Diagnosis not present

## 2018-09-26 DIAGNOSIS — F330 Major depressive disorder, recurrent, mild: Secondary | ICD-10-CM | POA: Diagnosis not present

## 2018-09-26 DIAGNOSIS — N186 End stage renal disease: Secondary | ICD-10-CM | POA: Diagnosis not present

## 2018-09-26 DIAGNOSIS — L03116 Cellulitis of left lower limb: Secondary | ICD-10-CM | POA: Diagnosis not present

## 2018-09-26 DIAGNOSIS — T8744 Infection of amputation stump, left lower extremity: Secondary | ICD-10-CM | POA: Diagnosis not present

## 2018-09-26 DIAGNOSIS — J449 Chronic obstructive pulmonary disease, unspecified: Secondary | ICD-10-CM | POA: Diagnosis not present

## 2018-09-26 DIAGNOSIS — Z992 Dependence on renal dialysis: Secondary | ICD-10-CM | POA: Diagnosis not present

## 2018-09-26 DIAGNOSIS — N2581 Secondary hyperparathyroidism of renal origin: Secondary | ICD-10-CM | POA: Diagnosis not present

## 2018-09-28 ENCOUNTER — Ambulatory Visit (HOSPITAL_COMMUNITY)
Admission: RE | Admit: 2018-09-28 | Discharge: 2018-09-28 | Disposition: A | Discharge status: Home / Self Care | Payer: Medicare Other | Source: Ambulatory Visit | Attending: Vascular Surgery | Admitting: Vascular Surgery

## 2018-09-28 ENCOUNTER — Ambulatory Visit (INDEPENDENT_AMBULATORY_CARE_PROVIDER_SITE_OTHER): Payer: Medicare Other | Admitting: Vascular Surgery

## 2018-09-28 ENCOUNTER — Encounter: Payer: Self-pay | Admitting: Vascular Surgery

## 2018-09-28 VITALS — BP 134/75 | HR 61 | Resp 18 | Ht <= 58 in | Wt 201.1 lb

## 2018-09-28 DIAGNOSIS — T8744 Infection of amputation stump, left lower extremity: Secondary | ICD-10-CM | POA: Diagnosis not present

## 2018-09-28 DIAGNOSIS — Z992 Dependence on renal dialysis: Secondary | ICD-10-CM

## 2018-09-28 DIAGNOSIS — I771 Stricture of artery: Secondary | ICD-10-CM | POA: Diagnosis not present

## 2018-09-28 DIAGNOSIS — D509 Iron deficiency anemia, unspecified: Secondary | ICD-10-CM | POA: Diagnosis not present

## 2018-09-28 DIAGNOSIS — T82510A Breakdown (mechanical) of surgically created arteriovenous fistula, initial encounter: Secondary | ICD-10-CM

## 2018-09-28 DIAGNOSIS — N186 End stage renal disease: Secondary | ICD-10-CM | POA: Diagnosis not present

## 2018-09-28 DIAGNOSIS — N2581 Secondary hyperparathyroidism of renal origin: Secondary | ICD-10-CM | POA: Diagnosis not present

## 2018-09-28 NOTE — Progress Notes (Signed)
Patient ID: MILANNA Soto, female   DOB: 03/09/1971, 47 y.o.   MRN: [id number]  Reason for Consult: Follow-up (eval fistula due to possible stenosis - HD MWF )   Referred by Neale Burly, MD  Subjective:     HPI:  Angela Soto is a 47 y.o. female with end-stage renal disease on dialysis Monday Wednesday Friday via left upper arm AV fistula.  This continues to work.  She does have some bleeding after the needles are removed but she has no issues with flow.  There is an area of pseudoaneurysmal degeneration near the antecubitum.  There is no thin skin.  She is sent here for evaluation possible surgical revision of her fistula.  Past Medical History:  Diagnosis Date  . Coronary artery disease    a. s/p prior PCI in 1998 b. 10/2017: cath showing severe two-vessel CAD with heavy calcification along the LCx and 100% CTO of RCA with left to right collaterals noted. No good options for PCI. Medical management recommended.   . Environmental allergies    uses inhalers  . ESRD (end stage renal disease) on dialysis [facility])    "started dialysis on 10/08/13; MWF; [facility]; [place], [place]"  . GERD (gastroesophageal reflux disease)   . EGBTDVVO(160.7)    "weekly" (11/09/2017)  . High cholesterol   . Hypertension   . Hypothyroidism   . Multiple sclerosis ([place])    just diagnosed 2014  . Myocardial infarction ([place]) 1998  . Nonalcoholic steatohepatitis ([facility])   . Peripheral vascular disease ([place])    aortic artery occlusion  . PONV (postoperative nausea and vomiting)   . Thyroid cancer ([place])   . Type 1 diabetes mellitus ([place])   . Umbilical hernia    Family History  Problem Relation Age of Onset  . Diabetes Mellitus II Mother   . Ovarian cancer Mother   . CAD Father 32  . Diabetes Mellitus II Father    Past Surgical History:  Procedure Laterality Date  . ABDOMINAL SURGERY  2006   aortic artery occluded, had to "clean it out"  . AMPUTATION Left 09/22/2014   Procedure: AMPUTATION LEFT FOURTH  FINGER;  Surgeon: Dayna Barker, MD;  Location: [place];  Service: Plastics;  Laterality: Left;  . AV FISTULA PLACEMENT Left 09/24/2013   Procedure: LEFT UPPER EXTREMITY ARTERIOVENOUS (AV) FISTULA CREATION BRACHIAL/CEPHALIC;  Surgeon: Elam Dutch, MD;  Location: [place];  Service: Vascular;  Laterality: Left;  . BASCILIC VEIN TRANSPOSITION Left 02/18/2014   Procedure: BASILIC VEIN TRANSPOSITION - 2ND STAGE LEFT ARM;  Surgeon: Elam Dutch, MD;  Location: [place];  Service: Vascular;  Laterality: Left;  . CARDIAC CATHETERIZATION     "numerous"  . CARDIAC CATHETERIZATION  11/09/2017  . CORONARY ANGIOPLASTY    . CORONARY ANGIOPLASTY WITH STENT PLACEMENT     "think I have a total of 3 stents" (11/09/2017)  . INSERTION OF DIALYSIS CATHETER Right 10/07/2013   Procedure: INSERTION OF DIALYSIS CATHETER;  Surgeon: Conrad Fairlawn, MD;  Location: [place];  Service: Vascular;  Laterality: Right;  . IRRIGATION AND DEBRIDEMENT ABSCESS Right   . LEFT HEART CATH AND CORONARY ANGIOGRAPHY N/A 11/09/2017   Procedure: LEFT HEART CATH AND CORONARY ANGIOGRAPHY;  Surgeon: Burnell Blanks, MD;  Location: [place] CV LAB;  Service: Cardiovascular;  Laterality: N/A;  . LEG AMPUTATION BELOW KNEE Bilateral 2008-2010   left-right  . PARATHYROIDECTOMY    . SHUNTOGRAM Left 04/17/2014   Procedure: FISTULOGRAM;  Surgeon: Jannette Fogo  Bridgett Larsson, MD;  Location: [facility] CATH LAB;  Service: Cardiovascular;  Laterality: Left;  . THYROIDECTOMY      Short Social History:  Social History   Tobacco Use  . Smoking status: Current Every Day Smoker    Packs/day: 1.00    Years: 32.00    Pack years: 32.00    Types: Cigarettes  . Smokeless tobacco: Never Used  Substance Use Topics  . Alcohol use: No    Allergies  Allergen Reactions  . Metformin And Related Diarrhea and Nausea And Vomiting  . Chantix [Varenicline]     "EVIL DREAMS"  . Ibuprofen Other (See Comments)    Cannot take because of low kidney function    Current  Outpatient Medications  Medication Sig Dispense Refill  . aspirin EC 81 MG tablet Take 1 tablet (81 mg total) by mouth daily. (Patient taking differently: Take 81 mg by mouth See admin instructions. Daily on Sunday, Tuesday, Thursday and Saturday)    . clopidogrel (PLAVIX) 75 MG tablet Take 75 mg by mouth See admin instructions. At Bedtime on Sunday, Tuesday, Thursday and Saturday    . insulin glargine (LANTUS) 100 UNIT/ML injection Inject 52 Units into the skin at bedtime.     . insulin lispro (HUMALOG) 100 UNIT/ML injection Inject 12 Units into the skin 3 (three) times daily before meals.     Marland Kitchen levothyroxine (SYNTHROID, LEVOTHROID) 150 MCG tablet Take 200 mcg by mouth daily before breakfast.     . lidocaine-prilocaine (EMLA) cream Apply 1 application topically as needed (for pain/ apply prior to dialysis).    . metoprolol (TOPROL-XL) 200 MG 24 hr tablet Take 200 mg by mouth See admin instructions. Twice daily on Sunday, Tuesday, Thursday and Saturday    . Omega-3 Fatty Acids (FISH OIL) 1000 MG CAPS Take 1,000 mg by mouth 3 (three) times daily.     . Oxycodone HCl 10 MG TABS Take 10 mg by mouth every 6 (six) hours as needed (for pain).     . promethazine (PHENERGAN) 25 MG tablet Take 25 mg by mouth every 6 (six) hours as needed for nausea or vomiting.    . sevelamer carbonate (RENVELA) 800 MG tablet Take 800-1,600 mg by mouth See admin instructions. Take 1 tablet  (800 mg) with snacks and 2 tablets (1600 mg) with meals on Sunday, Tuesday, Thursday and Saturday    . isosorbide mononitrate (IMDUR) 60 MG 24 hr tablet Take 1 tablet (60 mg total) by mouth daily. 90 tablet 3  . nicotine (NICODERM CQ - DOSED IN MG/24 HOURS) 14 mg/24hr patch Place 1 patch (14 mg total) onto the skin daily. (Patient not taking: Reported on 09/28/2018) 28 patch 0  . rosuvastatin (CRESTOR) 10 MG tablet Take 1 tablet (10 mg total) by mouth daily. 90 tablet 3  . vancomycin (VANCOCIN) 1 GM/200ML SOLN Inject 200 mLs (1,000 mg  total) into the vein every Monday, Wednesday, and Friday with hemodialysis. (Patient not taking: Reported on 09/28/2018) 4000 mL    No current facility-administered medications for this visit.     Review of Systems  Constitutional: Positive for fatigue.  HENT: HENT negative.  Eyes: Eyes negative.  Cardiovascular: Cardiovascular negative.  GI: Gastrointestinal negative.  Musculoskeletal: Musculoskeletal negative.  Skin: Skin negative.  Neurological: Neurological negative. Hematologic: Hematologic/lymphatic negative.  Psychiatric: Psychiatric negative.        Objective:  Objective   Vitals:   09/28/18 1541  BP: 134/75  Pulse: 61  Resp: 18  SpO2: 98%  Weight: 201  lb 1 oz (91.2 kg)  Height: 4\' 3"  (1.295 m)   Body mass index is 54.35 kg/m.  Physical Exam Eyes:     Pupils: Pupils are equal, round, and reactive to light.  Neck:     Musculoskeletal: Normal range of motion.  Cardiovascular:     Pulses:          Radial pulses are 2+ on the right side and 2+ on the left side.  Pulmonary:     Effort: Pulmonary effort is normal.  Musculoskeletal:     Comments: Bilateral below-knee amputations Left upper extremity fistula with strong thrill, there is an area of pseudoaneurysmal degeneration  Skin:    General: Skin is warm and dry.     Capillary Refill: Capillary refill takes less than 2 seconds.  Neurological:     General: No focal deficit present.     Mental Status: She is alert and oriented to person, place, and time.  Psychiatric:        Mood and Affect: Mood normal.        Behavior: Behavior normal.        Thought Content: Thought content normal.        Judgment: Judgment normal.     Data: I have independently interpreted her dialysis duplex which demonstrates flow volumes 596 mL/min.  The outflow vein is aneurysmal but there is no thrombus.  Diameter up to 2.4 cm in the distal upper arm.       Assessment/Plan:     47 year old female here for evaluation of  her left arm AV fistula.  Appears to be adequate flow and she is dialyzing well with only some bleeding after dialysis.  Aneurysmal degeneration does not have the skin I think is very low risk of rupture.  She is not excited about any surgical intervention I do not think she needs anything at this time.  I can see her on an as-needed basis for reevaluation.     Waynetta Sandy MD [facility]

## 2018-10-01 DIAGNOSIS — N2581 Secondary hyperparathyroidism of renal origin: Secondary | ICD-10-CM | POA: Diagnosis not present

## 2018-10-01 DIAGNOSIS — N186 End stage renal disease: Secondary | ICD-10-CM | POA: Diagnosis not present

## 2018-10-01 DIAGNOSIS — T8744 Infection of amputation stump, left lower extremity: Secondary | ICD-10-CM | POA: Diagnosis not present

## 2018-10-01 DIAGNOSIS — Z992 Dependence on renal dialysis: Secondary | ICD-10-CM | POA: Diagnosis not present

## 2018-10-01 DIAGNOSIS — D509 Iron deficiency anemia, unspecified: Secondary | ICD-10-CM | POA: Diagnosis not present

## 2018-10-02 ENCOUNTER — Ambulatory Visit (INDEPENDENT_AMBULATORY_CARE_PROVIDER_SITE_OTHER): Payer: Medicare Other | Admitting: Student

## 2018-10-02 ENCOUNTER — Encounter: Payer: Self-pay | Admitting: Student

## 2018-10-02 VITALS — BP 114/70 | HR 63 | Ht <= 58 in | Wt 202.8 lb

## 2018-10-02 DIAGNOSIS — N186 End stage renal disease: Secondary | ICD-10-CM

## 2018-10-02 DIAGNOSIS — I10 Essential (primary) hypertension: Secondary | ICD-10-CM | POA: Diagnosis not present

## 2018-10-02 DIAGNOSIS — E785 Hyperlipidemia, unspecified: Secondary | ICD-10-CM

## 2018-10-02 DIAGNOSIS — I25118 Atherosclerotic heart disease of native coronary artery with other forms of angina pectoris: Secondary | ICD-10-CM

## 2018-10-02 DIAGNOSIS — I25119 Atherosclerotic heart disease of native coronary artery with unspecified angina pectoris: Secondary | ICD-10-CM | POA: Diagnosis not present

## 2018-10-02 DIAGNOSIS — Z992 Dependence on renal dialysis: Secondary | ICD-10-CM

## 2018-10-02 DIAGNOSIS — Z720 Tobacco use: Secondary | ICD-10-CM

## 2018-10-02 MED ORDER — NICOTINE POLACRILEX 4 MG MT LOZG: 4.0000 mg | LOZENGE | OROMUCOSAL | 3 refills | Status: DC | PRN

## 2018-10-02 NOTE — Patient Instructions (Signed)
Medication Instructions:  ONLY TAKE TOPROL XL 100 MG - ON NON-DIALYSIS DAYS   Labwork: NONE  Testing/Procedures: NONE  Follow-Up: Your physician wants you to follow-up in: 6 MONTHS.  You will receive a reminder letter in the mail two months in advance. If you don't receive a letter, please call our office to schedule the follow-up appointment.   Any Other Special Instructions Will Be Listed Below (If Applicable).     If you need a refill on your cardiac medications before your next appointment, please call your pharmacy.

## 2018-10-02 NOTE — Progress Notes (Signed)
Cardiology Office Note    Date:  10/02/2018   ID:  Angela Soto, DOB 07-23-1971, MRN [id number]  PCP:  Neale Burly, MD  Cardiologist: Kate Sable, MD    Chief Complaint  Patient presents with  . Follow-up    6 month visit    History of Present Illness:    Angela Soto is a 47 y.o. female with past medical history of CAD (s/p prior PCI in 1998, recent catheterization in 10/2017 showing severe two-vessel CAD with heavy calcification along the LCx and 100% CTO of RCA with left to right collaterals noted --> medical management recommended as not felt to be a CABG candidate), ESRD (on HD - MWF), HTN, HLD, Hypothyroidism, IDDM, PVD (s/p bilateral BKA), and tobacco use who presents to the office today for overdue follow-up.  She was last examined by myself in 03/2018 and reported intermittent episodes of chest discomfort during dialysis which resolved within a few minutes. Imdur was further titrated from 30 mg daily to 60 mg daily and she was continued on ASA, Plavix, statin, and beta-blocker therapy.  In talking with the patient today, she reports recently being diagnosed with cellulitis along her left leg amputation site and has been started on Doxycycline along with IV Vancomycin during her dialysis sessions. She just started antibiotic therapy yesterday and has already noticed significant improvement in her symptoms. She is scheduled for further evaluation by her PCP later this week.  From a cardiac perspective, she reports still having episodes of chest discomfort occurring on a daily basis but has not noticed any increase in her symptoms. She has not had to utilize [facility] NTG. Reports her pain is usually constant throughout the day and has been like this for over a year. Did experience some improvement with titration of Imdur. Toprol-XL was reduced from 200 mg twice daily to 100 mg daily due to hypotension but she was unclear of the instructions and has been taking this twice  daily and still experiencing episodes of hypotension during dialysis. Reports that her breathing has overall been stable. No recent orthopnea, PND, or palpitations.  She was able to quit smoking for several weeks with the assistance of nicotine patches but was unable to afford additional patches and has resumed smoking.   Past Medical History:  Diagnosis Date  . Coronary artery disease    a. s/p prior PCI in 1998 b. 10/2017: cath showing severe two-vessel CAD with heavy calcification along the LCx and 100% CTO of RCA with left to right collaterals noted. No good options for PCI. Medical management recommended.   . Environmental allergies    uses inhalers  . ESRD (end stage renal disease) on dialysis [facility])    "started dialysis on 10/08/13; MWF; [facility]; [place], [place]"  . GERD (gastroesophageal reflux disease)   . NIDPOEUM(353.6)    "weekly" (11/09/2017)  . High cholesterol   . Hypertension   . Hypothyroidism   . Multiple sclerosis ([place])    just diagnosed 2014  . Myocardial infarction ([place]) 1998  . Nonalcoholic steatohepatitis ([facility])   . Peripheral vascular disease ([place])    aortic artery occlusion  . PONV (postoperative nausea and vomiting)   . Thyroid cancer ([place])   . Type 1 diabetes mellitus ([place])   . Umbilical hernia     Past Surgical History:  Procedure Laterality Date  . ABDOMINAL SURGERY  2006   aortic artery occluded, had to "clean it out"  . AMPUTATION Left 09/22/2014   Procedure: AMPUTATION  LEFT FOURTH FINGER;  Surgeon: Dayna Barker, MD;  Location: [place];  Service: Plastics;  Laterality: Left;  . AV FISTULA PLACEMENT Left 09/24/2013   Procedure: LEFT UPPER EXTREMITY ARTERIOVENOUS (AV) FISTULA CREATION BRACHIAL/CEPHALIC;  Surgeon: Elam Dutch, MD;  Location: [place];  Service: Vascular;  Laterality: Left;  . BASCILIC VEIN TRANSPOSITION Left 02/18/2014   Procedure: BASILIC VEIN TRANSPOSITION - 2ND STAGE LEFT ARM;  Surgeon: Elam Dutch, MD;  Location: [place];  Service:  Vascular;  Laterality: Left;  . CARDIAC CATHETERIZATION     "numerous"  . CARDIAC CATHETERIZATION  11/09/2017  . CORONARY ANGIOPLASTY    . CORONARY ANGIOPLASTY WITH STENT PLACEMENT     "think I have a total of 3 stents" (11/09/2017)  . INSERTION OF DIALYSIS CATHETER Right 10/07/2013   Procedure: INSERTION OF DIALYSIS CATHETER;  Surgeon: Conrad Speed, MD;  Location: [place];  Service: Vascular;  Laterality: Right;  . IRRIGATION AND DEBRIDEMENT ABSCESS Right   . LEFT HEART CATH AND CORONARY ANGIOGRAPHY N/A 11/09/2017   Procedure: LEFT HEART CATH AND CORONARY ANGIOGRAPHY;  Surgeon: Burnell Blanks, MD;  Location: [employer] CV LAB;  Service: Cardiovascular;  Laterality: N/A;  . LEG AMPUTATION BELOW KNEE Bilateral 2008-2010   left-right  . PARATHYROIDECTOMY    . SHUNTOGRAM Left 04/17/2014   Procedure: FISTULOGRAM;  Surgeon: Conrad , MD;  Location: [facility] CATH LAB;  Service: Cardiovascular;  Laterality: Left;  . THYROIDECTOMY      Current Medications: Outpatient Medications Prior to Visit  Medication Sig Dispense Refill  . aspirin EC 81 MG tablet Take 1 tablet (81 mg total) by mouth daily. (Patient taking differently: Take 81 mg by mouth See admin instructions. Daily on Sunday, Tuesday, Thursday and Saturday)    . clopidogrel (PLAVIX) 75 MG tablet Take 75 mg by mouth See admin instructions. At Bedtime on Sunday, Tuesday, Thursday and Saturday    . insulin glargine (LANTUS) 100 UNIT/ML injection Inject 52 Units into the skin at bedtime.     . insulin lispro (HUMALOG) 100 UNIT/ML injection Inject 12 Units into the skin 3 (three) times daily before meals.     . isosorbide mononitrate (IMDUR) 60 MG 24 hr tablet Take 1 tablet (60 mg total) by mouth daily. 90 tablet 3  . levothyroxine (SYNTHROID, LEVOTHROID) 150 MCG tablet Take 200 mcg by mouth daily before breakfast.     . lidocaine-prilocaine (EMLA) cream Apply 1 application topically as needed (for pain/ apply prior to dialysis).    .  metoprolol succinate (TOPROL-XL) 100 MG 24 hr tablet Take 100 mg by mouth as directed. Takes Tues/Thurs/Sat/Sun Non dialysis days    . Omega-3 Fatty Acids (FISH OIL) 1000 MG CAPS Take 1,000 mg by mouth 3 (three) times daily.     . Oxycodone HCl 10 MG TABS Take 10 mg by mouth every 6 (six) hours as needed (for pain).     . promethazine (PHENERGAN) 25 MG tablet Take 25 mg by mouth every 6 (six) hours as needed for nausea or vomiting.    . rosuvastatin (CRESTOR) 10 MG tablet Take 1 tablet (10 mg total) by mouth daily. 90 tablet 3  . sevelamer carbonate (RENVELA) 800 MG tablet Take 800-1,600 mg by mouth See admin instructions. Take 1 tablet  (800 mg) with snacks and 2 tablets (1600 mg) with meals on Sunday, Tuesday, Thursday and Saturday    . vancomycin (VANCOCIN) 1 GM/200ML SOLN Inject 200 mLs (1,000 mg total) into the vein every Monday, Wednesday,  and Friday with hemodialysis. 4000 mL   . metoprolol (TOPROL-XL) 200 MG 24 hr tablet Take 200 mg by mouth See admin instructions. Twice daily on Sunday, Tuesday, Thursday and Saturday    . nicotine (NICODERM CQ - DOSED IN MG/24 HOURS) 14 mg/24hr patch Place 1 patch (14 mg total) onto the skin daily. (Patient not taking: Reported on 09/28/2018) 28 patch 0   No facility-administered medications prior to visit.      Allergies:   Metformin and related; Chantix [varenicline]; and Ibuprofen   Social History   Socioeconomic History  . Marital status: Married    Spouse name: Not on file  . Number of children: Not on file  . Years of education: Not on file  . Highest education level: Not on file  Occupational History  . Not on file  Social Needs  . Financial resource strain: Not on file  . Food insecurity:    Worry: Not on file    Inability: Not on file  . Transportation needs:    Medical: Not on file    Non-medical: Not on file  Tobacco Use  . Smoking status: Current Every Day Smoker    Packs/day: 1.00    Years: 32.00    Pack years: 32.00     Types: Cigarettes  . Smokeless tobacco: Never Used  Substance and Sexual Activity  . Alcohol use: No  . Drug use: No  . Sexual activity: Never  Lifestyle  . Physical activity:    Days per week: Not on file    Minutes per session: Not on file  . Stress: Not on file  Relationships  . Social connections:    Talks on phone: Not on file    Gets together: Not on file    Attends religious service: Not on file    Active member of club or organization: Not on file    Attends meetings of clubs or organizations: Not on file    Relationship status: Not on file  Other Topics Concern  . Not on file  Social History Narrative  . Not on file     Family History:  The patient's family history includes CAD (age of onset: 63) in her father; Diabetes Mellitus II in her father and mother; Ovarian cancer in her mother.   Review of Systems:   Please see the history of present illness.     General:  No chills, fever, night sweats or weight changes.  Cardiovascular:  No dyspnea on exertion, edema, orthopnea, palpitations, paroxysmal nocturnal dyspnea. Positive for chest pain.  Dermatological: No rash, lesions/masses Respiratory: No cough, dyspnea Urologic: No hematuria, dysuria Abdominal:   No nausea, vomiting, diarrhea, bright red blood per rectum, melena, or hematemesis Neurologic:  No visual changes, wkns, changes in mental status. All other systems reviewed and are otherwise negative except as noted above.   Physical Exam:    VS:  BP 114/70   Pulse 63   Ht 4\' 3"  (1.295 m)   Wt 202 lb 13.2 oz (92 kg)   LMP 05/24/2013   SpO2 97%   BMI 54.83 kg/m    General: Well developed, chronically ill-appearing Caucasian female, currently in no acute distress. Head: Normocephalic, atraumatic, sclera non-icteric, no xanthomas, nares are without discharge.  Neck: No carotid bruits. JVD not elevated.  Lungs: Respirations regular and unlabored, without wheezes or rales.  Heart: Regular rate and rhythm.  No S3 or S4.  No murmur, no rubs, or gallops appreciated. Abdomen: Soft, non-tender, non-distended with  normoactive bowel sounds. No hepatomegaly. No rebound/guarding. No obvious abdominal masses. Msk:  Strength and tone appear normal for age. No joint deformities or effusions. Extremities: No clubbing or cyanosis. No edema.  Bilateral BKA. Dressings in place along left amputation site. Neuro: Alert and oriented X 3. Moves all extremities spontaneously. No focal deficits noted. Psych:  Responds to questions appropriately with a normal affect. Skin: No rashes or lesions noted  Wt Readings from Last 3 Encounters:  10/02/18 202 lb 13.2 oz (92 kg)  09/28/18 201 lb 1 oz (91.2 kg)  04/12/18 203 lb 14.8 oz (92.5 kg)     Studies/Labs Reviewed:   EKG:  EKG is not ordered today.  Recent Labs: 11/09/2017: Hemoglobin 13.5; Platelets 183 11/10/2017: BUN 30; Creatinine, Ser 3.74; Potassium 3.4; Sodium 136; TSH 0.426   Lipid Panel    Component Value Date/Time   CHOL 185 11/10/2017 0527   TRIG 295 (H) 11/10/2017 0527   HDL 24 (L) 11/10/2017 0527   CHOLHDL 7.7 11/10/2017 0527   VLDL 59 (H) 11/10/2017 0527   LDLCALC 102 (H) 11/10/2017 0527    Additional studies/ records that were reviewed today include:   Cardiac Catheterization: 11/09/2017  Mid RCA to Dist RCA lesion is 100% stenosed.   1. Severe double vessel CAD 2. The LAD is a large caliber vessel that courses to the apex. The mid vessel has a long segment of moderate, calcific stenosis. This does not appear to be flow limiting. The first diagonal is a small caliber vessel with moderate ostial stenosis. The second diagonal branch is a small caliber vessel with mild plaque.  3. The Circumflex is a moderate caliber, heavily calcified vessel from the ostium down into the obtuse marginal branches. There is moderately severe, heavily calcified stenosis from the proximal vessel down into the most distal obtuse marginal branch. The obtuse marginal  branch is completely occluded. It appears that the distal segment of this branch fills from bridging collaterals. The vessel is small in caliber after it reconstitutes.  4. The RCA is a large dominant vessel with 100% occlusion just beyond the ostium. The distal vessel and distal branches are seen to fill from left to right collaterals supplied by septal perforators.  5. LV systolic function is overall preserved with segmental wall motion abnormality. LVEF estimated around 45-50%.   Recommendations: She has complex CAD with no good options for revascularization. Her RCA is chronically occluded and fills from collaterals. The entire Circumflex is diffusely diseased and heavily calcified with a distal chronic total occlusion of the OM branch. I would not approach this vessel with PCI. If PCI of the Circumflex were considered, we would have to perform atherectomy of the entire vessel and likely could not cross the distal CTO. The entire vessel would need to be stented and the risk of restenosis would be very high in this patient with diabetes and ongoing tobacco abuse. She is not a candidate for CABG given her comorbid conditions including ESRD and the lack of graft conduit given bilateral lower extremity amputations. At this time, I would attempt to control her symptoms with medical therapy. She should consider stopping smoking as well.   Echocardiogram: 10/2017 Study Conclusions  - Left ventricle: The cavity size was normal. Wall thickness was   increased in a pattern of mild LVH. Systolic function was normal.   The estimated ejection fraction was in the range of 50% to 55%.   There is hypokinesis of the inferolateral myocardium. Features   are  consistent with a pseudonormal left ventricular filling   pattern, with concomitant abnormal relaxation and increased   filling pressure (grade 2 diastolic dysfunction). Doppler   parameters are consistent with high ventricular filling pressure. - Mitral  valve: Calcified annulus. - Left atrium: The atrium was severely dilated.  Impressions:  - Hypokinesis of the inferolateral wall with overall low normal LV   systolic function; mild LVH; moderate diastolic dysfunction;   severe LAE.  Assessment:    1. Coronary artery disease of native artery of native heart with stable angina pectoris ([place])   2. Essential hypertension   3. Hyperlipidemia LDL goal <70   4. ESRD (end stage renal disease) on dialysis ([place])   5. Tobacco use      Plan:   In order of problems listed above:  1. CAD - s/p prior PCI in 1998 with most recent catheterization in 10/2017 showing severe two-vessel CAD with heavy calcification along the LCx and 100% CTO of RCA with left to right collaterals noted --> medical management recommended as not felt to be a CABG candidate and no good options for revascularization.  - She does have stable angina but denies any increase in her symptoms. Will continue with medical management at this time including ASA, Plavix, Imdur, beta-blocker, and statin therapy. She did experience some improvement with titration of Imdur at her last office visit but she has also been experiencing episodes of hypotension during HD. Toprol-XL was reduced by Nephrology to 100 mg daily but she was confused about the instructions and has been taking 100 mg twice daily, even at night prior to her HD session the following day. Will reduce to 100 mg daily in the morning on non-HD days at this time given the 24-hour sustained release. If BP remains stable with this, could consider further titration of Imdur given this has helped with her symptoms over the past year.  2. HTN - BP is well controlled at 114/70 during today's visit. She does report having issues with hypotension during HD as outlined above. Continue Toprol-XL at 100 mg daily along with Imdur 60 mg daily.  3. HLD - Followed by PCP. She was previously intolerant to high intensity statin therapy and  was started on Crestor 10 mg daily earlier this year and has overall been tolerating this well. Will need to request records from her PCP. Goal LDL is < 70 in the setting of known CAD and PVD.  4. ESRD  - on HD - MWF schedule. Followed by Dr. Lowanda Foster. Will plan to reduce Toprol-XL to 100mg  daily as outlined above given hypotension during HD sessions.  5. Tobacco Use - Intolerant to Chantix due to frequent nightmares. Did have success with patches in the interim but these are unaffordable over-the-counter. She does have Medicaid coverage and by review of the preferred list, nicotine gum and lozenges are covered. Will send in Rx for this at patient's request.   Medication Adjustments/Labs and Tests Ordered: Current medicines are reviewed at length with the patient today.  Concerns regarding medicines are outlined above.  Medication changes, Labs and Tests ordered today are listed in the Patient Instructions below. Patient Instructions  Medication Instructions:  ONLY TAKE TOPROL XL 100 MG - ON NON-DIALYSIS DAYS   Labwork: NONE  Testing/Procedures: NONE  Follow-Up: Your physician wants you to follow-up in: 6 MONTHS.  You will receive a reminder letter in the mail two months in advance. If you don't receive a letter, please call our office to schedule the  follow-up appointment.   Any Other Special Instructions Will Be Listed Below (If Applicable).   If you need a refill on your cardiac medications before your next appointment, please call your pharmacy.    Signed, Erma Heritage, PA-C  10/02/2018 5:30 PM    [place] [address] [place], [place] [address] Phone: [phone]

## 2018-10-03 DIAGNOSIS — N186 End stage renal disease: Secondary | ICD-10-CM | POA: Diagnosis not present

## 2018-10-03 DIAGNOSIS — Z992 Dependence on renal dialysis: Secondary | ICD-10-CM | POA: Diagnosis not present

## 2018-10-03 DIAGNOSIS — N2581 Secondary hyperparathyroidism of renal origin: Secondary | ICD-10-CM | POA: Diagnosis not present

## 2018-10-03 DIAGNOSIS — T8744 Infection of amputation stump, left lower extremity: Secondary | ICD-10-CM | POA: Diagnosis not present

## 2018-10-03 DIAGNOSIS — D509 Iron deficiency anemia, unspecified: Secondary | ICD-10-CM | POA: Diagnosis not present

## 2018-10-05 DIAGNOSIS — G894 Chronic pain syndrome: Secondary | ICD-10-CM | POA: Diagnosis not present

## 2018-10-05 DIAGNOSIS — T8744 Infection of amputation stump, left lower extremity: Secondary | ICD-10-CM | POA: Diagnosis not present

## 2018-10-05 DIAGNOSIS — D509 Iron deficiency anemia, unspecified: Secondary | ICD-10-CM | POA: Diagnosis not present

## 2018-10-05 DIAGNOSIS — N2581 Secondary hyperparathyroidism of renal origin: Secondary | ICD-10-CM | POA: Diagnosis not present

## 2018-10-05 DIAGNOSIS — Z992 Dependence on renal dialysis: Secondary | ICD-10-CM | POA: Diagnosis not present

## 2018-10-05 DIAGNOSIS — N186 End stage renal disease: Secondary | ICD-10-CM | POA: Diagnosis not present

## 2018-10-05 DIAGNOSIS — Z79891 Long term (current) use of opiate analgesic: Secondary | ICD-10-CM | POA: Diagnosis not present

## 2018-10-07 DIAGNOSIS — N2581 Secondary hyperparathyroidism of renal origin: Secondary | ICD-10-CM | POA: Diagnosis not present

## 2018-10-07 DIAGNOSIS — N186 End stage renal disease: Secondary | ICD-10-CM | POA: Diagnosis not present

## 2018-10-07 DIAGNOSIS — D509 Iron deficiency anemia, unspecified: Secondary | ICD-10-CM | POA: Diagnosis not present

## 2018-10-07 DIAGNOSIS — Z992 Dependence on renal dialysis: Secondary | ICD-10-CM | POA: Diagnosis not present

## 2018-10-07 DIAGNOSIS — T8744 Infection of amputation stump, left lower extremity: Secondary | ICD-10-CM | POA: Diagnosis not present

## 2018-10-09 DIAGNOSIS — N2581 Secondary hyperparathyroidism of renal origin: Secondary | ICD-10-CM | POA: Diagnosis not present

## 2018-10-09 DIAGNOSIS — Z992 Dependence on renal dialysis: Secondary | ICD-10-CM | POA: Diagnosis not present

## 2018-10-09 DIAGNOSIS — D509 Iron deficiency anemia, unspecified: Secondary | ICD-10-CM | POA: Diagnosis not present

## 2018-10-09 DIAGNOSIS — N186 End stage renal disease: Secondary | ICD-10-CM | POA: Diagnosis not present

## 2018-10-09 DIAGNOSIS — T8744 Infection of amputation stump, left lower extremity: Secondary | ICD-10-CM | POA: Diagnosis not present

## 2018-10-12 DIAGNOSIS — Z794 Long term (current) use of insulin: Secondary | ICD-10-CM | POA: Diagnosis not present

## 2018-10-12 DIAGNOSIS — Z992 Dependence on renal dialysis: Secondary | ICD-10-CM | POA: Diagnosis not present

## 2018-10-12 DIAGNOSIS — N2581 Secondary hyperparathyroidism of renal origin: Secondary | ICD-10-CM | POA: Diagnosis not present

## 2018-10-12 DIAGNOSIS — D509 Iron deficiency anemia, unspecified: Secondary | ICD-10-CM | POA: Diagnosis not present

## 2018-10-12 DIAGNOSIS — N186 End stage renal disease: Secondary | ICD-10-CM | POA: Diagnosis not present

## 2018-10-12 DIAGNOSIS — E109 Type 1 diabetes mellitus without complications: Secondary | ICD-10-CM | POA: Diagnosis not present

## 2018-10-12 DIAGNOSIS — T8744 Infection of amputation stump, left lower extremity: Secondary | ICD-10-CM | POA: Diagnosis not present

## 2018-10-15 DIAGNOSIS — D509 Iron deficiency anemia, unspecified: Secondary | ICD-10-CM | POA: Diagnosis not present

## 2018-10-15 DIAGNOSIS — N2581 Secondary hyperparathyroidism of renal origin: Secondary | ICD-10-CM | POA: Diagnosis not present

## 2018-10-15 DIAGNOSIS — N186 End stage renal disease: Secondary | ICD-10-CM | POA: Diagnosis not present

## 2018-10-15 DIAGNOSIS — Z992 Dependence on renal dialysis: Secondary | ICD-10-CM | POA: Diagnosis not present

## 2018-10-15 DIAGNOSIS — T8744 Infection of amputation stump, left lower extremity: Secondary | ICD-10-CM | POA: Diagnosis not present

## 2018-10-16 DIAGNOSIS — Z992 Dependence on renal dialysis: Secondary | ICD-10-CM | POA: Diagnosis not present

## 2018-10-16 DIAGNOSIS — N186 End stage renal disease: Secondary | ICD-10-CM | POA: Diagnosis not present

## 2018-10-16 DIAGNOSIS — E8770 Fluid overload, unspecified: Secondary | ICD-10-CM | POA: Diagnosis not present

## 2018-10-17 DIAGNOSIS — N2581 Secondary hyperparathyroidism of renal origin: Secondary | ICD-10-CM | POA: Diagnosis not present

## 2018-10-17 DIAGNOSIS — T8744 Infection of amputation stump, left lower extremity: Secondary | ICD-10-CM | POA: Diagnosis not present

## 2018-10-17 DIAGNOSIS — N186 End stage renal disease: Secondary | ICD-10-CM | POA: Diagnosis not present

## 2018-10-17 DIAGNOSIS — D509 Iron deficiency anemia, unspecified: Secondary | ICD-10-CM | POA: Diagnosis not present

## 2018-10-17 DIAGNOSIS — Z992 Dependence on renal dialysis: Secondary | ICD-10-CM | POA: Diagnosis not present

## 2018-10-18 DIAGNOSIS — H2513 Age-related nuclear cataract, bilateral: Secondary | ICD-10-CM | POA: Diagnosis not present

## 2018-10-18 DIAGNOSIS — E113593 Type 2 diabetes mellitus with proliferative diabetic retinopathy without macular edema, bilateral: Secondary | ICD-10-CM | POA: Diagnosis not present

## 2018-10-18 DIAGNOSIS — M25561 Pain in right knee: Secondary | ICD-10-CM | POA: Diagnosis not present

## 2018-10-18 DIAGNOSIS — H3582 Retinal ischemia: Secondary | ICD-10-CM | POA: Diagnosis not present

## 2018-10-18 DIAGNOSIS — G894 Chronic pain syndrome: Secondary | ICD-10-CM | POA: Diagnosis not present

## 2018-10-18 DIAGNOSIS — M79606 Pain in leg, unspecified: Secondary | ICD-10-CM | POA: Diagnosis not present

## 2018-10-19 DIAGNOSIS — N186 End stage renal disease: Secondary | ICD-10-CM | POA: Diagnosis not present

## 2018-10-19 DIAGNOSIS — D509 Iron deficiency anemia, unspecified: Secondary | ICD-10-CM | POA: Diagnosis not present

## 2018-10-19 DIAGNOSIS — N2581 Secondary hyperparathyroidism of renal origin: Secondary | ICD-10-CM | POA: Diagnosis not present

## 2018-10-19 DIAGNOSIS — Z992 Dependence on renal dialysis: Secondary | ICD-10-CM | POA: Diagnosis not present

## 2018-10-19 DIAGNOSIS — T8744 Infection of amputation stump, left lower extremity: Secondary | ICD-10-CM | POA: Diagnosis not present

## 2018-10-22 DIAGNOSIS — N2581 Secondary hyperparathyroidism of renal origin: Secondary | ICD-10-CM | POA: Diagnosis not present

## 2018-10-22 DIAGNOSIS — D509 Iron deficiency anemia, unspecified: Secondary | ICD-10-CM | POA: Diagnosis not present

## 2018-10-22 DIAGNOSIS — T8744 Infection of amputation stump, left lower extremity: Secondary | ICD-10-CM | POA: Diagnosis not present

## 2018-10-22 DIAGNOSIS — N186 End stage renal disease: Secondary | ICD-10-CM | POA: Diagnosis not present

## 2018-10-22 DIAGNOSIS — Z992 Dependence on renal dialysis: Secondary | ICD-10-CM | POA: Diagnosis not present

## 2018-10-24 DIAGNOSIS — N186 End stage renal disease: Secondary | ICD-10-CM | POA: Diagnosis not present

## 2018-10-24 DIAGNOSIS — D509 Iron deficiency anemia, unspecified: Secondary | ICD-10-CM | POA: Diagnosis not present

## 2018-10-24 DIAGNOSIS — N2581 Secondary hyperparathyroidism of renal origin: Secondary | ICD-10-CM | POA: Diagnosis not present

## 2018-10-24 DIAGNOSIS — T8744 Infection of amputation stump, left lower extremity: Secondary | ICD-10-CM | POA: Diagnosis not present

## 2018-10-24 DIAGNOSIS — Z992 Dependence on renal dialysis: Secondary | ICD-10-CM | POA: Diagnosis not present

## 2018-10-26 DIAGNOSIS — N2581 Secondary hyperparathyroidism of renal origin: Secondary | ICD-10-CM | POA: Diagnosis not present

## 2018-10-26 DIAGNOSIS — D509 Iron deficiency anemia, unspecified: Secondary | ICD-10-CM | POA: Diagnosis not present

## 2018-10-26 DIAGNOSIS — N186 End stage renal disease: Secondary | ICD-10-CM | POA: Diagnosis not present

## 2018-10-26 DIAGNOSIS — Z992 Dependence on renal dialysis: Secondary | ICD-10-CM | POA: Diagnosis not present

## 2018-10-26 DIAGNOSIS — T8744 Infection of amputation stump, left lower extremity: Secondary | ICD-10-CM | POA: Diagnosis not present

## 2018-10-29 DIAGNOSIS — N2581 Secondary hyperparathyroidism of renal origin: Secondary | ICD-10-CM | POA: Diagnosis not present

## 2018-10-29 DIAGNOSIS — D509 Iron deficiency anemia, unspecified: Secondary | ICD-10-CM | POA: Diagnosis not present

## 2018-10-29 DIAGNOSIS — T8744 Infection of amputation stump, left lower extremity: Secondary | ICD-10-CM | POA: Diagnosis not present

## 2018-10-29 DIAGNOSIS — Z992 Dependence on renal dialysis: Secondary | ICD-10-CM | POA: Diagnosis not present

## 2018-10-29 DIAGNOSIS — N186 End stage renal disease: Secondary | ICD-10-CM | POA: Diagnosis not present

## 2018-10-31 DIAGNOSIS — D509 Iron deficiency anemia, unspecified: Secondary | ICD-10-CM | POA: Diagnosis not present

## 2018-10-31 DIAGNOSIS — N186 End stage renal disease: Secondary | ICD-10-CM | POA: Diagnosis not present

## 2018-10-31 DIAGNOSIS — N2581 Secondary hyperparathyroidism of renal origin: Secondary | ICD-10-CM | POA: Diagnosis not present

## 2018-10-31 DIAGNOSIS — T8744 Infection of amputation stump, left lower extremity: Secondary | ICD-10-CM | POA: Diagnosis not present

## 2018-10-31 DIAGNOSIS — Z992 Dependence on renal dialysis: Secondary | ICD-10-CM | POA: Diagnosis not present

## 2018-11-01 DIAGNOSIS — L03116 Cellulitis of left lower limb: Secondary | ICD-10-CM | POA: Diagnosis not present

## 2018-11-02 DIAGNOSIS — D509 Iron deficiency anemia, unspecified: Secondary | ICD-10-CM | POA: Diagnosis not present

## 2018-11-02 DIAGNOSIS — N2581 Secondary hyperparathyroidism of renal origin: Secondary | ICD-10-CM | POA: Diagnosis not present

## 2018-11-02 DIAGNOSIS — T8744 Infection of amputation stump, left lower extremity: Secondary | ICD-10-CM | POA: Diagnosis not present

## 2018-11-02 DIAGNOSIS — Z992 Dependence on renal dialysis: Secondary | ICD-10-CM | POA: Diagnosis not present

## 2018-11-02 DIAGNOSIS — N186 End stage renal disease: Secondary | ICD-10-CM | POA: Diagnosis not present

## 2018-11-05 DIAGNOSIS — N2581 Secondary hyperparathyroidism of renal origin: Secondary | ICD-10-CM | POA: Diagnosis not present

## 2018-11-05 DIAGNOSIS — D509 Iron deficiency anemia, unspecified: Secondary | ICD-10-CM | POA: Diagnosis not present

## 2018-11-05 DIAGNOSIS — T8744 Infection of amputation stump, left lower extremity: Secondary | ICD-10-CM | POA: Diagnosis not present

## 2018-11-05 DIAGNOSIS — N186 End stage renal disease: Secondary | ICD-10-CM | POA: Diagnosis not present

## 2018-11-05 DIAGNOSIS — Z992 Dependence on renal dialysis: Secondary | ICD-10-CM | POA: Diagnosis not present

## 2018-11-07 DIAGNOSIS — T8744 Infection of amputation stump, left lower extremity: Secondary | ICD-10-CM | POA: Diagnosis not present

## 2018-11-07 DIAGNOSIS — N2581 Secondary hyperparathyroidism of renal origin: Secondary | ICD-10-CM | POA: Diagnosis not present

## 2018-11-07 DIAGNOSIS — N186 End stage renal disease: Secondary | ICD-10-CM | POA: Diagnosis not present

## 2018-11-07 DIAGNOSIS — Z992 Dependence on renal dialysis: Secondary | ICD-10-CM | POA: Diagnosis not present

## 2018-11-07 DIAGNOSIS — D509 Iron deficiency anemia, unspecified: Secondary | ICD-10-CM | POA: Diagnosis not present

## 2018-11-09 DIAGNOSIS — N2581 Secondary hyperparathyroidism of renal origin: Secondary | ICD-10-CM | POA: Diagnosis not present

## 2018-11-09 DIAGNOSIS — D509 Iron deficiency anemia, unspecified: Secondary | ICD-10-CM | POA: Diagnosis not present

## 2018-11-09 DIAGNOSIS — Z992 Dependence on renal dialysis: Secondary | ICD-10-CM | POA: Diagnosis not present

## 2018-11-09 DIAGNOSIS — T8744 Infection of amputation stump, left lower extremity: Secondary | ICD-10-CM | POA: Diagnosis not present

## 2018-11-09 DIAGNOSIS — N186 End stage renal disease: Secondary | ICD-10-CM | POA: Diagnosis not present

## 2018-11-12 DIAGNOSIS — N2581 Secondary hyperparathyroidism of renal origin: Secondary | ICD-10-CM | POA: Diagnosis not present

## 2018-11-12 DIAGNOSIS — Z992 Dependence on renal dialysis: Secondary | ICD-10-CM | POA: Diagnosis not present

## 2018-11-12 DIAGNOSIS — N186 End stage renal disease: Secondary | ICD-10-CM | POA: Diagnosis not present

## 2018-11-12 DIAGNOSIS — D509 Iron deficiency anemia, unspecified: Secondary | ICD-10-CM | POA: Diagnosis not present

## 2018-11-12 DIAGNOSIS — T8744 Infection of amputation stump, left lower extremity: Secondary | ICD-10-CM | POA: Diagnosis not present

## 2018-11-14 DIAGNOSIS — Z992 Dependence on renal dialysis: Secondary | ICD-10-CM | POA: Diagnosis not present

## 2018-11-14 DIAGNOSIS — D509 Iron deficiency anemia, unspecified: Secondary | ICD-10-CM | POA: Diagnosis not present

## 2018-11-14 DIAGNOSIS — N2581 Secondary hyperparathyroidism of renal origin: Secondary | ICD-10-CM | POA: Diagnosis not present

## 2018-11-14 DIAGNOSIS — N186 End stage renal disease: Secondary | ICD-10-CM | POA: Diagnosis not present

## 2018-11-14 DIAGNOSIS — T8744 Infection of amputation stump, left lower extremity: Secondary | ICD-10-CM | POA: Diagnosis not present

## 2018-11-15 DIAGNOSIS — M47816 Spondylosis without myelopathy or radiculopathy, lumbar region: Secondary | ICD-10-CM | POA: Diagnosis not present

## 2018-11-15 DIAGNOSIS — G894 Chronic pain syndrome: Secondary | ICD-10-CM | POA: Diagnosis not present

## 2018-11-15 DIAGNOSIS — M169 Osteoarthritis of hip, unspecified: Secondary | ICD-10-CM | POA: Diagnosis not present

## 2018-11-15 DIAGNOSIS — M25561 Pain in right knee: Secondary | ICD-10-CM | POA: Diagnosis not present

## 2018-11-16 DIAGNOSIS — N186 End stage renal disease: Secondary | ICD-10-CM | POA: Diagnosis not present

## 2018-11-16 DIAGNOSIS — T8744 Infection of amputation stump, left lower extremity: Secondary | ICD-10-CM | POA: Diagnosis not present

## 2018-11-16 DIAGNOSIS — N2581 Secondary hyperparathyroidism of renal origin: Secondary | ICD-10-CM | POA: Diagnosis not present

## 2018-11-16 DIAGNOSIS — D509 Iron deficiency anemia, unspecified: Secondary | ICD-10-CM | POA: Diagnosis not present

## 2018-11-16 DIAGNOSIS — Z992 Dependence on renal dialysis: Secondary | ICD-10-CM | POA: Diagnosis not present

## 2018-11-19 DIAGNOSIS — N2581 Secondary hyperparathyroidism of renal origin: Secondary | ICD-10-CM | POA: Diagnosis not present

## 2018-11-19 DIAGNOSIS — Z992 Dependence on renal dialysis: Secondary | ICD-10-CM | POA: Diagnosis not present

## 2018-11-19 DIAGNOSIS — D509 Iron deficiency anemia, unspecified: Secondary | ICD-10-CM | POA: Diagnosis not present

## 2018-11-19 DIAGNOSIS — N186 End stage renal disease: Secondary | ICD-10-CM | POA: Diagnosis not present

## 2018-11-21 DIAGNOSIS — Z992 Dependence on renal dialysis: Secondary | ICD-10-CM | POA: Diagnosis not present

## 2018-11-21 DIAGNOSIS — N2581 Secondary hyperparathyroidism of renal origin: Secondary | ICD-10-CM | POA: Diagnosis not present

## 2018-11-21 DIAGNOSIS — N186 End stage renal disease: Secondary | ICD-10-CM | POA: Diagnosis not present

## 2018-11-21 DIAGNOSIS — D509 Iron deficiency anemia, unspecified: Secondary | ICD-10-CM | POA: Diagnosis not present

## 2018-11-23 DIAGNOSIS — Z992 Dependence on renal dialysis: Secondary | ICD-10-CM | POA: Diagnosis not present

## 2018-11-23 DIAGNOSIS — N2581 Secondary hyperparathyroidism of renal origin: Secondary | ICD-10-CM | POA: Diagnosis not present

## 2018-11-23 DIAGNOSIS — D509 Iron deficiency anemia, unspecified: Secondary | ICD-10-CM | POA: Diagnosis not present

## 2018-11-23 DIAGNOSIS — N186 End stage renal disease: Secondary | ICD-10-CM | POA: Diagnosis not present

## 2018-11-26 DIAGNOSIS — H2513 Age-related nuclear cataract, bilateral: Secondary | ICD-10-CM | POA: Diagnosis not present

## 2018-11-26 DIAGNOSIS — H2511 Age-related nuclear cataract, right eye: Secondary | ICD-10-CM | POA: Diagnosis not present

## 2018-11-26 DIAGNOSIS — E113593 Type 2 diabetes mellitus with proliferative diabetic retinopathy without macular edema, bilateral: Secondary | ICD-10-CM | POA: Diagnosis not present

## 2018-11-26 DIAGNOSIS — Z794 Long term (current) use of insulin: Secondary | ICD-10-CM | POA: Diagnosis not present

## 2018-11-27 DIAGNOSIS — Z992 Dependence on renal dialysis: Secondary | ICD-10-CM | POA: Diagnosis not present

## 2018-11-27 DIAGNOSIS — N2581 Secondary hyperparathyroidism of renal origin: Secondary | ICD-10-CM | POA: Diagnosis not present

## 2018-11-27 DIAGNOSIS — D509 Iron deficiency anemia, unspecified: Secondary | ICD-10-CM | POA: Diagnosis not present

## 2018-11-27 DIAGNOSIS — N186 End stage renal disease: Secondary | ICD-10-CM | POA: Diagnosis not present

## 2018-11-28 DIAGNOSIS — N186 End stage renal disease: Secondary | ICD-10-CM | POA: Diagnosis not present

## 2018-11-28 DIAGNOSIS — N2581 Secondary hyperparathyroidism of renal origin: Secondary | ICD-10-CM | POA: Diagnosis not present

## 2018-11-28 DIAGNOSIS — D509 Iron deficiency anemia, unspecified: Secondary | ICD-10-CM | POA: Diagnosis not present

## 2018-11-28 DIAGNOSIS — Z992 Dependence on renal dialysis: Secondary | ICD-10-CM | POA: Diagnosis not present

## 2018-11-30 DIAGNOSIS — N186 End stage renal disease: Secondary | ICD-10-CM | POA: Diagnosis not present

## 2018-11-30 DIAGNOSIS — Z992 Dependence on renal dialysis: Secondary | ICD-10-CM | POA: Diagnosis not present

## 2018-11-30 DIAGNOSIS — N2581 Secondary hyperparathyroidism of renal origin: Secondary | ICD-10-CM | POA: Diagnosis not present

## 2018-11-30 DIAGNOSIS — D509 Iron deficiency anemia, unspecified: Secondary | ICD-10-CM | POA: Diagnosis not present

## 2018-12-03 DIAGNOSIS — D509 Iron deficiency anemia, unspecified: Secondary | ICD-10-CM | POA: Diagnosis not present

## 2018-12-03 DIAGNOSIS — N186 End stage renal disease: Secondary | ICD-10-CM | POA: Diagnosis not present

## 2018-12-03 DIAGNOSIS — Z992 Dependence on renal dialysis: Secondary | ICD-10-CM | POA: Diagnosis not present

## 2018-12-03 DIAGNOSIS — N2581 Secondary hyperparathyroidism of renal origin: Secondary | ICD-10-CM | POA: Diagnosis not present

## 2018-12-05 DIAGNOSIS — N2581 Secondary hyperparathyroidism of renal origin: Secondary | ICD-10-CM | POA: Diagnosis not present

## 2018-12-05 DIAGNOSIS — Z992 Dependence on renal dialysis: Secondary | ICD-10-CM | POA: Diagnosis not present

## 2018-12-05 DIAGNOSIS — N186 End stage renal disease: Secondary | ICD-10-CM | POA: Diagnosis not present

## 2018-12-05 DIAGNOSIS — D509 Iron deficiency anemia, unspecified: Secondary | ICD-10-CM | POA: Diagnosis not present

## 2018-12-07 DIAGNOSIS — Z992 Dependence on renal dialysis: Secondary | ICD-10-CM | POA: Diagnosis not present

## 2018-12-07 DIAGNOSIS — N2581 Secondary hyperparathyroidism of renal origin: Secondary | ICD-10-CM | POA: Diagnosis not present

## 2018-12-07 DIAGNOSIS — D509 Iron deficiency anemia, unspecified: Secondary | ICD-10-CM | POA: Diagnosis not present

## 2018-12-07 DIAGNOSIS — N186 End stage renal disease: Secondary | ICD-10-CM | POA: Diagnosis not present

## 2018-12-10 DIAGNOSIS — D509 Iron deficiency anemia, unspecified: Secondary | ICD-10-CM | POA: Diagnosis not present

## 2018-12-10 DIAGNOSIS — N2581 Secondary hyperparathyroidism of renal origin: Secondary | ICD-10-CM | POA: Diagnosis not present

## 2018-12-10 DIAGNOSIS — N186 End stage renal disease: Secondary | ICD-10-CM | POA: Diagnosis not present

## 2018-12-10 DIAGNOSIS — Z992 Dependence on renal dialysis: Secondary | ICD-10-CM | POA: Diagnosis not present

## 2018-12-12 ENCOUNTER — Telehealth: Payer: Self-pay | Admitting: Student

## 2018-12-12 DIAGNOSIS — N186 End stage renal disease: Secondary | ICD-10-CM | POA: Diagnosis not present

## 2018-12-12 DIAGNOSIS — N2581 Secondary hyperparathyroidism of renal origin: Secondary | ICD-10-CM | POA: Diagnosis not present

## 2018-12-12 DIAGNOSIS — Z992 Dependence on renal dialysis: Secondary | ICD-10-CM | POA: Diagnosis not present

## 2018-12-12 DIAGNOSIS — D509 Iron deficiency anemia, unspecified: Secondary | ICD-10-CM | POA: Diagnosis not present

## 2018-12-12 NOTE — Telephone Encounter (Signed)
Spoke with Sherri from [facility]. She states that they did not receive surgical clearance. Clearance Refaxed to [facility].

## 2018-12-12 NOTE — Telephone Encounter (Signed)
Angela Soto @ [facility] is looking for a surgical clearance for this pt.   [phone] ext [phone]

## 2018-12-13 ENCOUNTER — Telehealth: Payer: Self-pay | Admitting: *Deleted

## 2018-12-13 DIAGNOSIS — G894 Chronic pain syndrome: Secondary | ICD-10-CM | POA: Diagnosis not present

## 2018-12-13 DIAGNOSIS — M47816 Spondylosis without myelopathy or radiculopathy, lumbar region: Secondary | ICD-10-CM | POA: Diagnosis not present

## 2018-12-13 DIAGNOSIS — M79606 Pain in leg, unspecified: Secondary | ICD-10-CM | POA: Diagnosis not present

## 2018-12-13 DIAGNOSIS — M25561 Pain in right knee: Secondary | ICD-10-CM | POA: Diagnosis not present

## 2018-12-13 NOTE — Telephone Encounter (Signed)
Call from [facility] at [facility]. Patient being seen by Caffie Damme PA. Request patient to be seen due to non-healing foot wound. Appt time given and msg. Left on voice mail for patient.

## 2018-12-14 DIAGNOSIS — N186 End stage renal disease: Secondary | ICD-10-CM | POA: Diagnosis not present

## 2018-12-14 DIAGNOSIS — Z992 Dependence on renal dialysis: Secondary | ICD-10-CM | POA: Diagnosis not present

## 2018-12-14 DIAGNOSIS — N2581 Secondary hyperparathyroidism of renal origin: Secondary | ICD-10-CM | POA: Diagnosis not present

## 2018-12-14 DIAGNOSIS — D509 Iron deficiency anemia, unspecified: Secondary | ICD-10-CM | POA: Diagnosis not present

## 2018-12-15 DIAGNOSIS — N186 End stage renal disease: Secondary | ICD-10-CM | POA: Diagnosis not present

## 2018-12-15 DIAGNOSIS — Z992 Dependence on renal dialysis: Secondary | ICD-10-CM | POA: Diagnosis not present

## 2018-12-17 DIAGNOSIS — Z992 Dependence on renal dialysis: Secondary | ICD-10-CM | POA: Diagnosis not present

## 2018-12-17 DIAGNOSIS — N186 End stage renal disease: Secondary | ICD-10-CM | POA: Diagnosis not present

## 2018-12-17 DIAGNOSIS — N2581 Secondary hyperparathyroidism of renal origin: Secondary | ICD-10-CM | POA: Diagnosis not present

## 2018-12-17 DIAGNOSIS — D509 Iron deficiency anemia, unspecified: Secondary | ICD-10-CM | POA: Diagnosis not present

## 2018-12-18 ENCOUNTER — Encounter: Payer: Self-pay | Admitting: Family

## 2018-12-18 ENCOUNTER — Ambulatory Visit: Payer: Medicare Other | Admitting: Physician Assistant

## 2018-12-18 ENCOUNTER — Telehealth: Payer: Self-pay | Admitting: *Deleted

## 2018-12-18 NOTE — Telephone Encounter (Signed)
Called the referring office/[facility] to notify them patient was a no show for today's appointment.

## 2018-12-19 DIAGNOSIS — D509 Iron deficiency anemia, unspecified: Secondary | ICD-10-CM | POA: Diagnosis not present

## 2018-12-19 DIAGNOSIS — N2581 Secondary hyperparathyroidism of renal origin: Secondary | ICD-10-CM | POA: Diagnosis not present

## 2018-12-19 DIAGNOSIS — N186 End stage renal disease: Secondary | ICD-10-CM | POA: Diagnosis not present

## 2018-12-19 DIAGNOSIS — Z992 Dependence on renal dialysis: Secondary | ICD-10-CM | POA: Diagnosis not present

## 2018-12-20 DIAGNOSIS — N186 End stage renal disease: Secondary | ICD-10-CM | POA: Diagnosis not present

## 2018-12-20 DIAGNOSIS — E8779 Other fluid overload: Secondary | ICD-10-CM | POA: Diagnosis not present

## 2018-12-20 DIAGNOSIS — Z992 Dependence on renal dialysis: Secondary | ICD-10-CM | POA: Diagnosis not present

## 2018-12-21 DIAGNOSIS — N186 End stage renal disease: Secondary | ICD-10-CM | POA: Diagnosis not present

## 2018-12-21 DIAGNOSIS — N2581 Secondary hyperparathyroidism of renal origin: Secondary | ICD-10-CM | POA: Diagnosis not present

## 2018-12-21 DIAGNOSIS — D509 Iron deficiency anemia, unspecified: Secondary | ICD-10-CM | POA: Diagnosis not present

## 2018-12-21 DIAGNOSIS — Z992 Dependence on renal dialysis: Secondary | ICD-10-CM | POA: Diagnosis not present

## 2018-12-24 DIAGNOSIS — Z992 Dependence on renal dialysis: Secondary | ICD-10-CM | POA: Diagnosis not present

## 2018-12-24 DIAGNOSIS — N186 End stage renal disease: Secondary | ICD-10-CM | POA: Diagnosis not present

## 2018-12-24 DIAGNOSIS — D509 Iron deficiency anemia, unspecified: Secondary | ICD-10-CM | POA: Diagnosis not present

## 2018-12-24 DIAGNOSIS — N2581 Secondary hyperparathyroidism of renal origin: Secondary | ICD-10-CM | POA: Diagnosis not present

## 2018-12-25 DIAGNOSIS — I10 Essential (primary) hypertension: Secondary | ICD-10-CM | POA: Diagnosis not present

## 2018-12-25 DIAGNOSIS — L03116 Cellulitis of left lower limb: Secondary | ICD-10-CM | POA: Diagnosis not present

## 2018-12-26 DIAGNOSIS — N186 End stage renal disease: Secondary | ICD-10-CM | POA: Diagnosis not present

## 2018-12-26 DIAGNOSIS — D509 Iron deficiency anemia, unspecified: Secondary | ICD-10-CM | POA: Diagnosis not present

## 2018-12-26 DIAGNOSIS — Z992 Dependence on renal dialysis: Secondary | ICD-10-CM | POA: Diagnosis not present

## 2018-12-26 DIAGNOSIS — N2581 Secondary hyperparathyroidism of renal origin: Secondary | ICD-10-CM | POA: Diagnosis not present

## 2018-12-28 DIAGNOSIS — N2581 Secondary hyperparathyroidism of renal origin: Secondary | ICD-10-CM | POA: Diagnosis not present

## 2018-12-28 DIAGNOSIS — D509 Iron deficiency anemia, unspecified: Secondary | ICD-10-CM | POA: Diagnosis not present

## 2018-12-28 DIAGNOSIS — N186 End stage renal disease: Secondary | ICD-10-CM | POA: Diagnosis not present

## 2018-12-28 DIAGNOSIS — Z992 Dependence on renal dialysis: Secondary | ICD-10-CM | POA: Diagnosis not present

## 2018-12-31 DIAGNOSIS — N186 End stage renal disease: Secondary | ICD-10-CM | POA: Diagnosis not present

## 2018-12-31 DIAGNOSIS — D509 Iron deficiency anemia, unspecified: Secondary | ICD-10-CM | POA: Diagnosis not present

## 2018-12-31 DIAGNOSIS — N2581 Secondary hyperparathyroidism of renal origin: Secondary | ICD-10-CM | POA: Diagnosis not present

## 2018-12-31 DIAGNOSIS — Z992 Dependence on renal dialysis: Secondary | ICD-10-CM | POA: Diagnosis not present

## 2019-01-02 DIAGNOSIS — D509 Iron deficiency anemia, unspecified: Secondary | ICD-10-CM | POA: Diagnosis not present

## 2019-01-02 DIAGNOSIS — Z992 Dependence on renal dialysis: Secondary | ICD-10-CM | POA: Diagnosis not present

## 2019-01-02 DIAGNOSIS — N2581 Secondary hyperparathyroidism of renal origin: Secondary | ICD-10-CM | POA: Diagnosis not present

## 2019-01-02 DIAGNOSIS — N186 End stage renal disease: Secondary | ICD-10-CM | POA: Diagnosis not present

## 2019-01-03 ENCOUNTER — Other Ambulatory Visit: Payer: Self-pay | Admitting: Student

## 2019-01-04 ENCOUNTER — Telehealth: Payer: Self-pay | Admitting: Student

## 2019-01-04 DIAGNOSIS — Z992 Dependence on renal dialysis: Secondary | ICD-10-CM | POA: Diagnosis not present

## 2019-01-04 DIAGNOSIS — N2581 Secondary hyperparathyroidism of renal origin: Secondary | ICD-10-CM | POA: Diagnosis not present

## 2019-01-04 DIAGNOSIS — N186 End stage renal disease: Secondary | ICD-10-CM | POA: Diagnosis not present

## 2019-01-04 DIAGNOSIS — D509 Iron deficiency anemia, unspecified: Secondary | ICD-10-CM | POA: Diagnosis not present

## 2019-01-04 MED ORDER — ISOSORBIDE MONONITRATE ER 60 MG PO TB24: ORAL_TABLET | ORAL | 3 refills | Status: DC

## 2019-01-04 NOTE — Telephone Encounter (Signed)
Patient will increase Imdur to 60 mg am and 30 mg pm, requests 90 day supply to [facility] drug.She will call back if sx's don't resolve

## 2019-01-04 NOTE — Telephone Encounter (Signed)
   Can increase Imdur to 90mg  daily. If symptoms are mostly in the evening, can take 60mg  in AM/30mg  in PM. If symptoms mostly during the day, would take 90mg  in AM.   Signed, Erma Heritage, PA-C 01/04/2019, 4:20 PM Pager: [phone]

## 2019-01-04 NOTE — Telephone Encounter (Signed)
Patient states that she is having angina again due to decrease of Metoroprolol. States that she was told to call to have imdur increased if angina continued. / tg

## 2019-01-06 DIAGNOSIS — G35 Multiple sclerosis: Secondary | ICD-10-CM | POA: Diagnosis not present

## 2019-01-06 DIAGNOSIS — Z89611 Acquired absence of right leg above knee: Secondary | ICD-10-CM | POA: Diagnosis not present

## 2019-01-06 DIAGNOSIS — Z89022 Acquired absence of left finger(s): Secondary | ICD-10-CM | POA: Diagnosis not present

## 2019-01-06 DIAGNOSIS — F17210 Nicotine dependence, cigarettes, uncomplicated: Secondary | ICD-10-CM | POA: Diagnosis not present

## 2019-01-06 DIAGNOSIS — L03116 Cellulitis of left lower limb: Secondary | ICD-10-CM | POA: Diagnosis not present

## 2019-01-06 DIAGNOSIS — I129 Hypertensive chronic kidney disease with stage 1 through stage 4 chronic kidney disease, or unspecified chronic kidney disease: Secondary | ICD-10-CM | POA: Diagnosis not present

## 2019-01-06 DIAGNOSIS — T8744 Infection of amputation stump, left lower extremity: Secondary | ICD-10-CM | POA: Diagnosis not present

## 2019-01-06 DIAGNOSIS — N186 End stage renal disease: Secondary | ICD-10-CM | POA: Diagnosis not present

## 2019-01-06 DIAGNOSIS — Z794 Long term (current) use of insulin: Secondary | ICD-10-CM | POA: Diagnosis not present

## 2019-01-06 DIAGNOSIS — T8754 Necrosis of amputation stump, left lower extremity: Secondary | ICD-10-CM | POA: Diagnosis not present

## 2019-01-06 DIAGNOSIS — E1122 Type 2 diabetes mellitus with diabetic chronic kidney disease: Secondary | ICD-10-CM | POA: Diagnosis not present

## 2019-01-06 DIAGNOSIS — Z7902 Long term (current) use of antithrombotics/antiplatelets: Secondary | ICD-10-CM | POA: Diagnosis not present

## 2019-01-06 DIAGNOSIS — F329 Major depressive disorder, single episode, unspecified: Secondary | ICD-10-CM | POA: Diagnosis not present

## 2019-01-06 DIAGNOSIS — Z89612 Acquired absence of left leg above knee: Secondary | ICD-10-CM | POA: Diagnosis not present

## 2019-01-06 DIAGNOSIS — Z9049 Acquired absence of other specified parts of digestive tract: Secondary | ICD-10-CM | POA: Diagnosis not present

## 2019-01-07 DIAGNOSIS — D509 Iron deficiency anemia, unspecified: Secondary | ICD-10-CM | POA: Diagnosis not present

## 2019-01-07 DIAGNOSIS — N2581 Secondary hyperparathyroidism of renal origin: Secondary | ICD-10-CM | POA: Diagnosis not present

## 2019-01-07 DIAGNOSIS — E109 Type 1 diabetes mellitus without complications: Secondary | ICD-10-CM | POA: Diagnosis not present

## 2019-01-07 DIAGNOSIS — Z992 Dependence on renal dialysis: Secondary | ICD-10-CM | POA: Diagnosis not present

## 2019-01-07 DIAGNOSIS — N186 End stage renal disease: Secondary | ICD-10-CM | POA: Diagnosis not present

## 2019-01-07 DIAGNOSIS — Z794 Long term (current) use of insulin: Secondary | ICD-10-CM | POA: Diagnosis not present

## 2019-01-09 DIAGNOSIS — D509 Iron deficiency anemia, unspecified: Secondary | ICD-10-CM | POA: Diagnosis not present

## 2019-01-09 DIAGNOSIS — N2581 Secondary hyperparathyroidism of renal origin: Secondary | ICD-10-CM | POA: Diagnosis not present

## 2019-01-09 DIAGNOSIS — N186 End stage renal disease: Secondary | ICD-10-CM | POA: Diagnosis not present

## 2019-01-09 DIAGNOSIS — Z992 Dependence on renal dialysis: Secondary | ICD-10-CM | POA: Diagnosis not present

## 2019-01-10 DIAGNOSIS — E1121 Type 2 diabetes mellitus with diabetic nephropathy: Secondary | ICD-10-CM | POA: Diagnosis not present

## 2019-01-10 DIAGNOSIS — K210 Gastro-esophageal reflux disease with esophagitis: Secondary | ICD-10-CM | POA: Diagnosis not present

## 2019-01-10 DIAGNOSIS — E038 Other specified hypothyroidism: Secondary | ICD-10-CM | POA: Diagnosis not present

## 2019-01-11 DIAGNOSIS — D509 Iron deficiency anemia, unspecified: Secondary | ICD-10-CM | POA: Diagnosis not present

## 2019-01-11 DIAGNOSIS — N186 End stage renal disease: Secondary | ICD-10-CM | POA: Diagnosis not present

## 2019-01-11 DIAGNOSIS — Z992 Dependence on renal dialysis: Secondary | ICD-10-CM | POA: Diagnosis not present

## 2019-01-11 DIAGNOSIS — N2581 Secondary hyperparathyroidism of renal origin: Secondary | ICD-10-CM | POA: Diagnosis not present

## 2019-01-14 DIAGNOSIS — N186 End stage renal disease: Secondary | ICD-10-CM | POA: Diagnosis not present

## 2019-01-14 DIAGNOSIS — D509 Iron deficiency anemia, unspecified: Secondary | ICD-10-CM | POA: Diagnosis not present

## 2019-01-14 DIAGNOSIS — N2581 Secondary hyperparathyroidism of renal origin: Secondary | ICD-10-CM | POA: Diagnosis not present

## 2019-01-14 DIAGNOSIS — Z992 Dependence on renal dialysis: Secondary | ICD-10-CM | POA: Diagnosis not present

## 2019-01-15 DIAGNOSIS — E1122 Type 2 diabetes mellitus with diabetic chronic kidney disease: Secondary | ICD-10-CM | POA: Diagnosis not present

## 2019-01-15 DIAGNOSIS — M25561 Pain in right knee: Secondary | ICD-10-CM | POA: Diagnosis not present

## 2019-01-15 DIAGNOSIS — L03116 Cellulitis of left lower limb: Secondary | ICD-10-CM | POA: Diagnosis not present

## 2019-01-15 DIAGNOSIS — T8744 Infection of amputation stump, left lower extremity: Secondary | ICD-10-CM | POA: Diagnosis not present

## 2019-01-15 DIAGNOSIS — M169 Osteoarthritis of hip, unspecified: Secondary | ICD-10-CM | POA: Diagnosis not present

## 2019-01-15 DIAGNOSIS — T8754 Necrosis of amputation stump, left lower extremity: Secondary | ICD-10-CM | POA: Diagnosis not present

## 2019-01-15 DIAGNOSIS — M47816 Spondylosis without myelopathy or radiculopathy, lumbar region: Secondary | ICD-10-CM | POA: Diagnosis not present

## 2019-01-15 DIAGNOSIS — G894 Chronic pain syndrome: Secondary | ICD-10-CM | POA: Diagnosis not present

## 2019-01-15 DIAGNOSIS — N186 End stage renal disease: Secondary | ICD-10-CM | POA: Diagnosis not present

## 2019-01-15 DIAGNOSIS — I129 Hypertensive chronic kidney disease with stage 1 through stage 4 chronic kidney disease, or unspecified chronic kidney disease: Secondary | ICD-10-CM | POA: Diagnosis not present

## 2019-01-16 DIAGNOSIS — D509 Iron deficiency anemia, unspecified: Secondary | ICD-10-CM | POA: Diagnosis not present

## 2019-01-16 DIAGNOSIS — N2581 Secondary hyperparathyroidism of renal origin: Secondary | ICD-10-CM | POA: Diagnosis not present

## 2019-01-16 DIAGNOSIS — N186 End stage renal disease: Secondary | ICD-10-CM | POA: Diagnosis not present

## 2019-01-16 DIAGNOSIS — Z992 Dependence on renal dialysis: Secondary | ICD-10-CM | POA: Diagnosis not present

## 2019-01-18 DIAGNOSIS — D509 Iron deficiency anemia, unspecified: Secondary | ICD-10-CM | POA: Diagnosis not present

## 2019-01-18 DIAGNOSIS — Z992 Dependence on renal dialysis: Secondary | ICD-10-CM | POA: Diagnosis not present

## 2019-01-18 DIAGNOSIS — N2581 Secondary hyperparathyroidism of renal origin: Secondary | ICD-10-CM | POA: Diagnosis not present

## 2019-01-18 DIAGNOSIS — N186 End stage renal disease: Secondary | ICD-10-CM | POA: Diagnosis not present

## 2019-01-21 DIAGNOSIS — N186 End stage renal disease: Secondary | ICD-10-CM | POA: Diagnosis not present

## 2019-01-21 DIAGNOSIS — Z992 Dependence on renal dialysis: Secondary | ICD-10-CM | POA: Diagnosis not present

## 2019-01-21 DIAGNOSIS — D509 Iron deficiency anemia, unspecified: Secondary | ICD-10-CM | POA: Diagnosis not present

## 2019-01-21 DIAGNOSIS — N2581 Secondary hyperparathyroidism of renal origin: Secondary | ICD-10-CM | POA: Diagnosis not present

## 2019-01-23 DIAGNOSIS — D509 Iron deficiency anemia, unspecified: Secondary | ICD-10-CM | POA: Diagnosis not present

## 2019-01-23 DIAGNOSIS — N186 End stage renal disease: Secondary | ICD-10-CM | POA: Diagnosis not present

## 2019-01-23 DIAGNOSIS — Z992 Dependence on renal dialysis: Secondary | ICD-10-CM | POA: Diagnosis not present

## 2019-01-23 DIAGNOSIS — N2581 Secondary hyperparathyroidism of renal origin: Secondary | ICD-10-CM | POA: Diagnosis not present

## 2019-01-24 DIAGNOSIS — T8754 Necrosis of amputation stump, left lower extremity: Secondary | ICD-10-CM | POA: Diagnosis not present

## 2019-01-24 DIAGNOSIS — N186 End stage renal disease: Secondary | ICD-10-CM | POA: Diagnosis not present

## 2019-01-24 DIAGNOSIS — T8744 Infection of amputation stump, left lower extremity: Secondary | ICD-10-CM | POA: Diagnosis not present

## 2019-01-24 DIAGNOSIS — E1122 Type 2 diabetes mellitus with diabetic chronic kidney disease: Secondary | ICD-10-CM | POA: Diagnosis not present

## 2019-01-24 DIAGNOSIS — I129 Hypertensive chronic kidney disease with stage 1 through stage 4 chronic kidney disease, or unspecified chronic kidney disease: Secondary | ICD-10-CM | POA: Diagnosis not present

## 2019-01-24 DIAGNOSIS — L03116 Cellulitis of left lower limb: Secondary | ICD-10-CM | POA: Diagnosis not present

## 2019-01-25 DIAGNOSIS — N2581 Secondary hyperparathyroidism of renal origin: Secondary | ICD-10-CM | POA: Diagnosis not present

## 2019-01-25 DIAGNOSIS — Z992 Dependence on renal dialysis: Secondary | ICD-10-CM | POA: Diagnosis not present

## 2019-01-25 DIAGNOSIS — D509 Iron deficiency anemia, unspecified: Secondary | ICD-10-CM | POA: Diagnosis not present

## 2019-01-25 DIAGNOSIS — N186 End stage renal disease: Secondary | ICD-10-CM | POA: Diagnosis not present

## 2019-01-28 DIAGNOSIS — D509 Iron deficiency anemia, unspecified: Secondary | ICD-10-CM | POA: Diagnosis not present

## 2019-01-28 DIAGNOSIS — N2581 Secondary hyperparathyroidism of renal origin: Secondary | ICD-10-CM | POA: Diagnosis not present

## 2019-01-28 DIAGNOSIS — Z992 Dependence on renal dialysis: Secondary | ICD-10-CM | POA: Diagnosis not present

## 2019-01-28 DIAGNOSIS — N186 End stage renal disease: Secondary | ICD-10-CM | POA: Diagnosis not present

## 2019-01-29 DIAGNOSIS — K210 Gastro-esophageal reflux disease with esophagitis: Secondary | ICD-10-CM | POA: Diagnosis not present

## 2019-01-29 DIAGNOSIS — E1121 Type 2 diabetes mellitus with diabetic nephropathy: Secondary | ICD-10-CM | POA: Diagnosis not present

## 2019-01-29 DIAGNOSIS — E038 Other specified hypothyroidism: Secondary | ICD-10-CM | POA: Diagnosis not present

## 2019-01-30 DIAGNOSIS — Z992 Dependence on renal dialysis: Secondary | ICD-10-CM | POA: Diagnosis not present

## 2019-01-30 DIAGNOSIS — D509 Iron deficiency anemia, unspecified: Secondary | ICD-10-CM | POA: Diagnosis not present

## 2019-01-30 DIAGNOSIS — N2581 Secondary hyperparathyroidism of renal origin: Secondary | ICD-10-CM | POA: Diagnosis not present

## 2019-01-30 DIAGNOSIS — N186 End stage renal disease: Secondary | ICD-10-CM | POA: Diagnosis not present

## 2019-01-31 DIAGNOSIS — T8754 Necrosis of amputation stump, left lower extremity: Secondary | ICD-10-CM | POA: Diagnosis not present

## 2019-01-31 DIAGNOSIS — N186 End stage renal disease: Secondary | ICD-10-CM | POA: Diagnosis not present

## 2019-01-31 DIAGNOSIS — L03116 Cellulitis of left lower limb: Secondary | ICD-10-CM | POA: Diagnosis not present

## 2019-01-31 DIAGNOSIS — T8744 Infection of amputation stump, left lower extremity: Secondary | ICD-10-CM | POA: Diagnosis not present

## 2019-01-31 DIAGNOSIS — I129 Hypertensive chronic kidney disease with stage 1 through stage 4 chronic kidney disease, or unspecified chronic kidney disease: Secondary | ICD-10-CM | POA: Diagnosis not present

## 2019-01-31 DIAGNOSIS — E1122 Type 2 diabetes mellitus with diabetic chronic kidney disease: Secondary | ICD-10-CM | POA: Diagnosis not present

## 2019-02-01 DIAGNOSIS — N186 End stage renal disease: Secondary | ICD-10-CM | POA: Diagnosis not present

## 2019-02-01 DIAGNOSIS — N2581 Secondary hyperparathyroidism of renal origin: Secondary | ICD-10-CM | POA: Diagnosis not present

## 2019-02-01 DIAGNOSIS — Z992 Dependence on renal dialysis: Secondary | ICD-10-CM | POA: Diagnosis not present

## 2019-02-01 DIAGNOSIS — D509 Iron deficiency anemia, unspecified: Secondary | ICD-10-CM | POA: Diagnosis not present

## 2019-02-04 DIAGNOSIS — N2581 Secondary hyperparathyroidism of renal origin: Secondary | ICD-10-CM | POA: Diagnosis not present

## 2019-02-04 DIAGNOSIS — Z992 Dependence on renal dialysis: Secondary | ICD-10-CM | POA: Diagnosis not present

## 2019-02-04 DIAGNOSIS — D509 Iron deficiency anemia, unspecified: Secondary | ICD-10-CM | POA: Diagnosis not present

## 2019-02-04 DIAGNOSIS — N186 End stage renal disease: Secondary | ICD-10-CM | POA: Diagnosis not present

## 2019-02-05 DIAGNOSIS — L03116 Cellulitis of left lower limb: Secondary | ICD-10-CM | POA: Diagnosis not present

## 2019-02-05 DIAGNOSIS — Z89022 Acquired absence of left finger(s): Secondary | ICD-10-CM | POA: Diagnosis not present

## 2019-02-05 DIAGNOSIS — F17210 Nicotine dependence, cigarettes, uncomplicated: Secondary | ICD-10-CM | POA: Diagnosis not present

## 2019-02-05 DIAGNOSIS — Z794 Long term (current) use of insulin: Secondary | ICD-10-CM | POA: Diagnosis not present

## 2019-02-05 DIAGNOSIS — T8744 Infection of amputation stump, left lower extremity: Secondary | ICD-10-CM | POA: Diagnosis not present

## 2019-02-05 DIAGNOSIS — Z89611 Acquired absence of right leg above knee: Secondary | ICD-10-CM | POA: Diagnosis not present

## 2019-02-05 DIAGNOSIS — E1122 Type 2 diabetes mellitus with diabetic chronic kidney disease: Secondary | ICD-10-CM | POA: Diagnosis not present

## 2019-02-05 DIAGNOSIS — I129 Hypertensive chronic kidney disease with stage 1 through stage 4 chronic kidney disease, or unspecified chronic kidney disease: Secondary | ICD-10-CM | POA: Diagnosis not present

## 2019-02-05 DIAGNOSIS — G35 Multiple sclerosis: Secondary | ICD-10-CM | POA: Diagnosis not present

## 2019-02-05 DIAGNOSIS — Z9049 Acquired absence of other specified parts of digestive tract: Secondary | ICD-10-CM | POA: Diagnosis not present

## 2019-02-05 DIAGNOSIS — F329 Major depressive disorder, single episode, unspecified: Secondary | ICD-10-CM | POA: Diagnosis not present

## 2019-02-05 DIAGNOSIS — N186 End stage renal disease: Secondary | ICD-10-CM | POA: Diagnosis not present

## 2019-02-05 DIAGNOSIS — Z7902 Long term (current) use of antithrombotics/antiplatelets: Secondary | ICD-10-CM | POA: Diagnosis not present

## 2019-02-05 DIAGNOSIS — Z89612 Acquired absence of left leg above knee: Secondary | ICD-10-CM | POA: Diagnosis not present

## 2019-02-05 DIAGNOSIS — T8754 Necrosis of amputation stump, left lower extremity: Secondary | ICD-10-CM | POA: Diagnosis not present

## 2019-02-06 DIAGNOSIS — Z992 Dependence on renal dialysis: Secondary | ICD-10-CM | POA: Diagnosis not present

## 2019-02-06 DIAGNOSIS — N186 End stage renal disease: Secondary | ICD-10-CM | POA: Diagnosis not present

## 2019-02-06 DIAGNOSIS — N2581 Secondary hyperparathyroidism of renal origin: Secondary | ICD-10-CM | POA: Diagnosis not present

## 2019-02-06 DIAGNOSIS — D509 Iron deficiency anemia, unspecified: Secondary | ICD-10-CM | POA: Diagnosis not present

## 2019-02-07 DIAGNOSIS — T8754 Necrosis of amputation stump, left lower extremity: Secondary | ICD-10-CM | POA: Diagnosis not present

## 2019-02-07 DIAGNOSIS — N186 End stage renal disease: Secondary | ICD-10-CM | POA: Diagnosis not present

## 2019-02-07 DIAGNOSIS — I129 Hypertensive chronic kidney disease with stage 1 through stage 4 chronic kidney disease, or unspecified chronic kidney disease: Secondary | ICD-10-CM | POA: Diagnosis not present

## 2019-02-07 DIAGNOSIS — L03116 Cellulitis of left lower limb: Secondary | ICD-10-CM | POA: Diagnosis not present

## 2019-02-07 DIAGNOSIS — E1122 Type 2 diabetes mellitus with diabetic chronic kidney disease: Secondary | ICD-10-CM | POA: Diagnosis not present

## 2019-02-07 DIAGNOSIS — T8744 Infection of amputation stump, left lower extremity: Secondary | ICD-10-CM | POA: Diagnosis not present

## 2019-02-08 DIAGNOSIS — Z992 Dependence on renal dialysis: Secondary | ICD-10-CM | POA: Diagnosis not present

## 2019-02-08 DIAGNOSIS — N2581 Secondary hyperparathyroidism of renal origin: Secondary | ICD-10-CM | POA: Diagnosis not present

## 2019-02-08 DIAGNOSIS — D509 Iron deficiency anemia, unspecified: Secondary | ICD-10-CM | POA: Diagnosis not present

## 2019-02-08 DIAGNOSIS — N186 End stage renal disease: Secondary | ICD-10-CM | POA: Diagnosis not present

## 2019-02-11 DIAGNOSIS — N186 End stage renal disease: Secondary | ICD-10-CM | POA: Diagnosis not present

## 2019-02-11 DIAGNOSIS — L0390 Cellulitis, unspecified: Secondary | ICD-10-CM | POA: Diagnosis not present

## 2019-02-11 DIAGNOSIS — N2581 Secondary hyperparathyroidism of renal origin: Secondary | ICD-10-CM | POA: Diagnosis not present

## 2019-02-11 DIAGNOSIS — Z992 Dependence on renal dialysis: Secondary | ICD-10-CM | POA: Diagnosis not present

## 2019-02-11 DIAGNOSIS — D509 Iron deficiency anemia, unspecified: Secondary | ICD-10-CM | POA: Diagnosis not present

## 2019-02-12 DIAGNOSIS — M169 Osteoarthritis of hip, unspecified: Secondary | ICD-10-CM | POA: Diagnosis not present

## 2019-02-12 DIAGNOSIS — G894 Chronic pain syndrome: Secondary | ICD-10-CM | POA: Diagnosis not present

## 2019-02-12 DIAGNOSIS — M25561 Pain in right knee: Secondary | ICD-10-CM | POA: Diagnosis not present

## 2019-02-12 DIAGNOSIS — M47816 Spondylosis without myelopathy or radiculopathy, lumbar region: Secondary | ICD-10-CM | POA: Diagnosis not present

## 2019-02-13 DIAGNOSIS — N186 End stage renal disease: Secondary | ICD-10-CM | POA: Diagnosis not present

## 2019-02-13 DIAGNOSIS — N2581 Secondary hyperparathyroidism of renal origin: Secondary | ICD-10-CM | POA: Diagnosis not present

## 2019-02-13 DIAGNOSIS — Z992 Dependence on renal dialysis: Secondary | ICD-10-CM | POA: Diagnosis not present

## 2019-02-13 DIAGNOSIS — D509 Iron deficiency anemia, unspecified: Secondary | ICD-10-CM | POA: Diagnosis not present

## 2019-02-14 DIAGNOSIS — N186 End stage renal disease: Secondary | ICD-10-CM | POA: Diagnosis not present

## 2019-02-14 DIAGNOSIS — Z992 Dependence on renal dialysis: Secondary | ICD-10-CM | POA: Diagnosis not present

## 2019-02-15 DIAGNOSIS — Z992 Dependence on renal dialysis: Secondary | ICD-10-CM | POA: Diagnosis not present

## 2019-02-15 DIAGNOSIS — N186 End stage renal disease: Secondary | ICD-10-CM | POA: Diagnosis not present

## 2019-02-15 DIAGNOSIS — D509 Iron deficiency anemia, unspecified: Secondary | ICD-10-CM | POA: Diagnosis not present

## 2019-02-16 DIAGNOSIS — Z7902 Long term (current) use of antithrombotics/antiplatelets: Secondary | ICD-10-CM | POA: Diagnosis not present

## 2019-02-16 DIAGNOSIS — F17200 Nicotine dependence, unspecified, uncomplicated: Secondary | ICD-10-CM | POA: Diagnosis present

## 2019-02-16 DIAGNOSIS — I252 Old myocardial infarction: Secondary | ICD-10-CM | POA: Diagnosis not present

## 2019-02-16 DIAGNOSIS — Z992 Dependence on renal dialysis: Secondary | ICD-10-CM | POA: Diagnosis not present

## 2019-02-16 DIAGNOSIS — L03116 Cellulitis of left lower limb: Secondary | ICD-10-CM | POA: Diagnosis not present

## 2019-02-16 DIAGNOSIS — N186 End stage renal disease: Secondary | ICD-10-CM | POA: Diagnosis present

## 2019-02-16 DIAGNOSIS — E11628 Type 2 diabetes mellitus with other skin complications: Secondary | ICD-10-CM | POA: Diagnosis not present

## 2019-02-16 DIAGNOSIS — E1122 Type 2 diabetes mellitus with diabetic chronic kidney disease: Secondary | ICD-10-CM | POA: Diagnosis not present

## 2019-02-16 DIAGNOSIS — M25562 Pain in left knee: Secondary | ICD-10-CM | POA: Diagnosis not present

## 2019-02-16 DIAGNOSIS — I120 Hypertensive chronic kidney disease with stage 5 chronic kidney disease or end stage renal disease: Secondary | ICD-10-CM | POA: Diagnosis not present

## 2019-02-16 DIAGNOSIS — Z89511 Acquired absence of right leg below knee: Secondary | ICD-10-CM | POA: Diagnosis not present

## 2019-02-16 DIAGNOSIS — I731 Thromboangiitis obliterans [Buerger's disease]: Secondary | ICD-10-CM | POA: Diagnosis present

## 2019-02-16 DIAGNOSIS — E039 Hypothyroidism, unspecified: Secondary | ICD-10-CM | POA: Diagnosis not present

## 2019-02-16 DIAGNOSIS — E1140 Type 2 diabetes mellitus with diabetic neuropathy, unspecified: Secondary | ICD-10-CM | POA: Diagnosis not present

## 2019-02-16 DIAGNOSIS — G35 Multiple sclerosis: Secondary | ICD-10-CM | POA: Diagnosis present

## 2019-02-16 DIAGNOSIS — Z6841 Body Mass Index (BMI) 40.0 and over, adult: Secondary | ICD-10-CM | POA: Diagnosis not present

## 2019-02-16 DIAGNOSIS — E669 Obesity, unspecified: Secondary | ICD-10-CM | POA: Diagnosis present

## 2019-02-16 DIAGNOSIS — E1151 Type 2 diabetes mellitus with diabetic peripheral angiopathy without gangrene: Secondary | ICD-10-CM | POA: Diagnosis present

## 2019-02-16 DIAGNOSIS — Z794 Long term (current) use of insulin: Secondary | ICD-10-CM | POA: Diagnosis not present

## 2019-02-16 DIAGNOSIS — Z89512 Acquired absence of left leg below knee: Secondary | ICD-10-CM | POA: Diagnosis not present

## 2019-02-16 DIAGNOSIS — G8929 Other chronic pain: Secondary | ICD-10-CM | POA: Diagnosis not present

## 2019-02-18 DIAGNOSIS — Z992 Dependence on renal dialysis: Secondary | ICD-10-CM | POA: Diagnosis not present

## 2019-02-18 DIAGNOSIS — N186 End stage renal disease: Secondary | ICD-10-CM | POA: Diagnosis not present

## 2019-02-18 DIAGNOSIS — D509 Iron deficiency anemia, unspecified: Secondary | ICD-10-CM | POA: Diagnosis not present

## 2019-02-19 DIAGNOSIS — T8744 Infection of amputation stump, left lower extremity: Secondary | ICD-10-CM | POA: Diagnosis not present

## 2019-02-19 DIAGNOSIS — E1122 Type 2 diabetes mellitus with diabetic chronic kidney disease: Secondary | ICD-10-CM | POA: Diagnosis not present

## 2019-02-19 DIAGNOSIS — T8754 Necrosis of amputation stump, left lower extremity: Secondary | ICD-10-CM | POA: Diagnosis not present

## 2019-02-19 DIAGNOSIS — N186 End stage renal disease: Secondary | ICD-10-CM | POA: Diagnosis not present

## 2019-02-19 DIAGNOSIS — L03116 Cellulitis of left lower limb: Secondary | ICD-10-CM | POA: Diagnosis not present

## 2019-02-19 DIAGNOSIS — I129 Hypertensive chronic kidney disease with stage 1 through stage 4 chronic kidney disease, or unspecified chronic kidney disease: Secondary | ICD-10-CM | POA: Diagnosis not present

## 2019-02-20 DIAGNOSIS — N186 End stage renal disease: Secondary | ICD-10-CM | POA: Diagnosis not present

## 2019-02-20 DIAGNOSIS — D509 Iron deficiency anemia, unspecified: Secondary | ICD-10-CM | POA: Diagnosis not present

## 2019-02-20 DIAGNOSIS — Z992 Dependence on renal dialysis: Secondary | ICD-10-CM | POA: Diagnosis not present

## 2019-02-22 DIAGNOSIS — D509 Iron deficiency anemia, unspecified: Secondary | ICD-10-CM | POA: Diagnosis not present

## 2019-02-22 DIAGNOSIS — Z992 Dependence on renal dialysis: Secondary | ICD-10-CM | POA: Diagnosis not present

## 2019-02-22 DIAGNOSIS — N186 End stage renal disease: Secondary | ICD-10-CM | POA: Diagnosis not present

## 2019-02-25 DIAGNOSIS — D509 Iron deficiency anemia, unspecified: Secondary | ICD-10-CM | POA: Diagnosis not present

## 2019-02-25 DIAGNOSIS — Z992 Dependence on renal dialysis: Secondary | ICD-10-CM | POA: Diagnosis not present

## 2019-02-25 DIAGNOSIS — N186 End stage renal disease: Secondary | ICD-10-CM | POA: Diagnosis not present

## 2019-02-26 DIAGNOSIS — L0390 Cellulitis, unspecified: Secondary | ICD-10-CM | POA: Diagnosis not present

## 2019-02-27 DIAGNOSIS — Z992 Dependence on renal dialysis: Secondary | ICD-10-CM | POA: Diagnosis not present

## 2019-02-27 DIAGNOSIS — N186 End stage renal disease: Secondary | ICD-10-CM | POA: Diagnosis not present

## 2019-02-27 DIAGNOSIS — D509 Iron deficiency anemia, unspecified: Secondary | ICD-10-CM | POA: Diagnosis not present

## 2019-03-01 DIAGNOSIS — T8744 Infection of amputation stump, left lower extremity: Secondary | ICD-10-CM | POA: Diagnosis not present

## 2019-03-01 DIAGNOSIS — I129 Hypertensive chronic kidney disease with stage 1 through stage 4 chronic kidney disease, or unspecified chronic kidney disease: Secondary | ICD-10-CM | POA: Diagnosis not present

## 2019-03-01 DIAGNOSIS — N186 End stage renal disease: Secondary | ICD-10-CM | POA: Diagnosis not present

## 2019-03-01 DIAGNOSIS — Z992 Dependence on renal dialysis: Secondary | ICD-10-CM | POA: Diagnosis not present

## 2019-03-01 DIAGNOSIS — D509 Iron deficiency anemia, unspecified: Secondary | ICD-10-CM | POA: Diagnosis not present

## 2019-03-01 DIAGNOSIS — T8754 Necrosis of amputation stump, left lower extremity: Secondary | ICD-10-CM | POA: Diagnosis not present

## 2019-03-01 DIAGNOSIS — E1122 Type 2 diabetes mellitus with diabetic chronic kidney disease: Secondary | ICD-10-CM | POA: Diagnosis not present

## 2019-03-01 DIAGNOSIS — L03116 Cellulitis of left lower limb: Secondary | ICD-10-CM | POA: Diagnosis not present

## 2019-03-04 DIAGNOSIS — D509 Iron deficiency anemia, unspecified: Secondary | ICD-10-CM | POA: Diagnosis not present

## 2019-03-04 DIAGNOSIS — N186 End stage renal disease: Secondary | ICD-10-CM | POA: Diagnosis not present

## 2019-03-04 DIAGNOSIS — Z992 Dependence on renal dialysis: Secondary | ICD-10-CM | POA: Diagnosis not present

## 2019-03-06 DIAGNOSIS — Z992 Dependence on renal dialysis: Secondary | ICD-10-CM | POA: Diagnosis not present

## 2019-03-06 DIAGNOSIS — D509 Iron deficiency anemia, unspecified: Secondary | ICD-10-CM | POA: Diagnosis not present

## 2019-03-06 DIAGNOSIS — N186 End stage renal disease: Secondary | ICD-10-CM | POA: Diagnosis not present

## 2019-03-07 DIAGNOSIS — F17210 Nicotine dependence, cigarettes, uncomplicated: Secondary | ICD-10-CM | POA: Diagnosis not present

## 2019-03-07 DIAGNOSIS — Z9049 Acquired absence of other specified parts of digestive tract: Secondary | ICD-10-CM | POA: Diagnosis not present

## 2019-03-07 DIAGNOSIS — L03116 Cellulitis of left lower limb: Secondary | ICD-10-CM | POA: Diagnosis not present

## 2019-03-07 DIAGNOSIS — Z794 Long term (current) use of insulin: Secondary | ICD-10-CM | POA: Diagnosis not present

## 2019-03-07 DIAGNOSIS — G35 Multiple sclerosis: Secondary | ICD-10-CM | POA: Diagnosis not present

## 2019-03-07 DIAGNOSIS — Z89611 Acquired absence of right leg above knee: Secondary | ICD-10-CM | POA: Diagnosis not present

## 2019-03-07 DIAGNOSIS — T8754 Necrosis of amputation stump, left lower extremity: Secondary | ICD-10-CM | POA: Diagnosis not present

## 2019-03-07 DIAGNOSIS — F329 Major depressive disorder, single episode, unspecified: Secondary | ICD-10-CM | POA: Diagnosis not present

## 2019-03-07 DIAGNOSIS — T8744 Infection of amputation stump, left lower extremity: Secondary | ICD-10-CM | POA: Diagnosis not present

## 2019-03-07 DIAGNOSIS — E1122 Type 2 diabetes mellitus with diabetic chronic kidney disease: Secondary | ICD-10-CM | POA: Diagnosis not present

## 2019-03-07 DIAGNOSIS — Z89022 Acquired absence of left finger(s): Secondary | ICD-10-CM | POA: Diagnosis not present

## 2019-03-07 DIAGNOSIS — Z89612 Acquired absence of left leg above knee: Secondary | ICD-10-CM | POA: Diagnosis not present

## 2019-03-07 DIAGNOSIS — N186 End stage renal disease: Secondary | ICD-10-CM | POA: Diagnosis not present

## 2019-03-07 DIAGNOSIS — Z7902 Long term (current) use of antithrombotics/antiplatelets: Secondary | ICD-10-CM | POA: Diagnosis not present

## 2019-03-07 DIAGNOSIS — I120 Hypertensive chronic kidney disease with stage 5 chronic kidney disease or end stage renal disease: Secondary | ICD-10-CM | POA: Diagnosis not present

## 2019-03-08 DIAGNOSIS — Z992 Dependence on renal dialysis: Secondary | ICD-10-CM | POA: Diagnosis not present

## 2019-03-08 DIAGNOSIS — N186 End stage renal disease: Secondary | ICD-10-CM | POA: Diagnosis not present

## 2019-03-08 DIAGNOSIS — D509 Iron deficiency anemia, unspecified: Secondary | ICD-10-CM | POA: Diagnosis not present

## 2019-03-11 DIAGNOSIS — Z992 Dependence on renal dialysis: Secondary | ICD-10-CM | POA: Diagnosis not present

## 2019-03-11 DIAGNOSIS — D509 Iron deficiency anemia, unspecified: Secondary | ICD-10-CM | POA: Diagnosis not present

## 2019-03-11 DIAGNOSIS — N186 End stage renal disease: Secondary | ICD-10-CM | POA: Diagnosis not present

## 2019-03-12 DIAGNOSIS — M25561 Pain in right knee: Secondary | ICD-10-CM | POA: Diagnosis not present

## 2019-03-12 DIAGNOSIS — M47816 Spondylosis without myelopathy or radiculopathy, lumbar region: Secondary | ICD-10-CM | POA: Diagnosis not present

## 2019-03-12 DIAGNOSIS — M169 Osteoarthritis of hip, unspecified: Secondary | ICD-10-CM | POA: Diagnosis not present

## 2019-03-12 DIAGNOSIS — G894 Chronic pain syndrome: Secondary | ICD-10-CM | POA: Diagnosis not present

## 2019-03-13 DIAGNOSIS — Z992 Dependence on renal dialysis: Secondary | ICD-10-CM | POA: Diagnosis not present

## 2019-03-13 DIAGNOSIS — D509 Iron deficiency anemia, unspecified: Secondary | ICD-10-CM | POA: Diagnosis not present

## 2019-03-13 DIAGNOSIS — N186 End stage renal disease: Secondary | ICD-10-CM | POA: Diagnosis not present

## 2019-03-14 DIAGNOSIS — T8754 Necrosis of amputation stump, left lower extremity: Secondary | ICD-10-CM | POA: Diagnosis not present

## 2019-03-14 DIAGNOSIS — L03116 Cellulitis of left lower limb: Secondary | ICD-10-CM | POA: Diagnosis not present

## 2019-03-14 DIAGNOSIS — T8744 Infection of amputation stump, left lower extremity: Secondary | ICD-10-CM | POA: Diagnosis not present

## 2019-03-14 DIAGNOSIS — I120 Hypertensive chronic kidney disease with stage 5 chronic kidney disease or end stage renal disease: Secondary | ICD-10-CM | POA: Diagnosis not present

## 2019-03-14 DIAGNOSIS — N186 End stage renal disease: Secondary | ICD-10-CM | POA: Diagnosis not present

## 2019-03-14 DIAGNOSIS — E1122 Type 2 diabetes mellitus with diabetic chronic kidney disease: Secondary | ICD-10-CM | POA: Diagnosis not present

## 2019-03-15 DIAGNOSIS — D509 Iron deficiency anemia, unspecified: Secondary | ICD-10-CM | POA: Diagnosis not present

## 2019-03-15 DIAGNOSIS — N186 End stage renal disease: Secondary | ICD-10-CM | POA: Diagnosis not present

## 2019-03-15 DIAGNOSIS — Z992 Dependence on renal dialysis: Secondary | ICD-10-CM | POA: Diagnosis not present

## 2019-03-17 DIAGNOSIS — N186 End stage renal disease: Secondary | ICD-10-CM | POA: Diagnosis not present

## 2019-03-17 DIAGNOSIS — Z992 Dependence on renal dialysis: Secondary | ICD-10-CM | POA: Diagnosis not present

## 2019-03-18 DIAGNOSIS — D509 Iron deficiency anemia, unspecified: Secondary | ICD-10-CM | POA: Diagnosis not present

## 2019-03-18 DIAGNOSIS — N2581 Secondary hyperparathyroidism of renal origin: Secondary | ICD-10-CM | POA: Diagnosis not present

## 2019-03-18 DIAGNOSIS — Z992 Dependence on renal dialysis: Secondary | ICD-10-CM | POA: Diagnosis not present

## 2019-03-18 DIAGNOSIS — N186 End stage renal disease: Secondary | ICD-10-CM | POA: Diagnosis not present

## 2019-03-19 DIAGNOSIS — E119 Type 2 diabetes mellitus without complications: Secondary | ICD-10-CM | POA: Diagnosis not present

## 2019-03-19 DIAGNOSIS — I10 Essential (primary) hypertension: Secondary | ICD-10-CM | POA: Diagnosis not present

## 2019-03-19 DIAGNOSIS — I25118 Atherosclerotic heart disease of native coronary artery with other forms of angina pectoris: Secondary | ICD-10-CM | POA: Diagnosis not present

## 2019-03-20 ENCOUNTER — Encounter (HOSPITAL_BASED_OUTPATIENT_CLINIC_OR_DEPARTMENT_OTHER): Payer: Medicare Other

## 2019-03-21 DIAGNOSIS — Z992 Dependence on renal dialysis: Secondary | ICD-10-CM | POA: Diagnosis not present

## 2019-03-21 DIAGNOSIS — N2581 Secondary hyperparathyroidism of renal origin: Secondary | ICD-10-CM | POA: Diagnosis not present

## 2019-03-21 DIAGNOSIS — N186 End stage renal disease: Secondary | ICD-10-CM | POA: Diagnosis not present

## 2019-03-21 DIAGNOSIS — D509 Iron deficiency anemia, unspecified: Secondary | ICD-10-CM | POA: Diagnosis not present

## 2019-03-22 DIAGNOSIS — N2581 Secondary hyperparathyroidism of renal origin: Secondary | ICD-10-CM | POA: Diagnosis not present

## 2019-03-22 DIAGNOSIS — N186 End stage renal disease: Secondary | ICD-10-CM | POA: Diagnosis not present

## 2019-03-22 DIAGNOSIS — D509 Iron deficiency anemia, unspecified: Secondary | ICD-10-CM | POA: Diagnosis not present

## 2019-03-22 DIAGNOSIS — Z992 Dependence on renal dialysis: Secondary | ICD-10-CM | POA: Diagnosis not present

## 2019-03-25 DIAGNOSIS — D509 Iron deficiency anemia, unspecified: Secondary | ICD-10-CM | POA: Diagnosis not present

## 2019-03-25 DIAGNOSIS — N186 End stage renal disease: Secondary | ICD-10-CM | POA: Diagnosis not present

## 2019-03-25 DIAGNOSIS — Z992 Dependence on renal dialysis: Secondary | ICD-10-CM | POA: Diagnosis not present

## 2019-03-25 DIAGNOSIS — N2581 Secondary hyperparathyroidism of renal origin: Secondary | ICD-10-CM | POA: Diagnosis not present

## 2019-03-26 ENCOUNTER — Encounter (HOSPITAL_BASED_OUTPATIENT_CLINIC_OR_DEPARTMENT_OTHER): Payer: Medicare Other | Attending: Internal Medicine

## 2019-03-26 DIAGNOSIS — Z89512 Acquired absence of left leg below knee: Secondary | ICD-10-CM | POA: Insufficient documentation

## 2019-03-26 DIAGNOSIS — I120 Hypertensive chronic kidney disease with stage 5 chronic kidney disease or end stage renal disease: Secondary | ICD-10-CM | POA: Insufficient documentation

## 2019-03-26 DIAGNOSIS — L97822 Non-pressure chronic ulcer of other part of left lower leg with fat layer exposed: Secondary | ICD-10-CM | POA: Insufficient documentation

## 2019-03-26 DIAGNOSIS — E1022 Type 1 diabetes mellitus with diabetic chronic kidney disease: Secondary | ICD-10-CM | POA: Insufficient documentation

## 2019-03-26 DIAGNOSIS — Z89511 Acquired absence of right leg below knee: Secondary | ICD-10-CM | POA: Insufficient documentation

## 2019-03-26 DIAGNOSIS — Z85850 Personal history of malignant neoplasm of thyroid: Secondary | ICD-10-CM | POA: Insufficient documentation

## 2019-03-26 DIAGNOSIS — Z794 Long term (current) use of insulin: Secondary | ICD-10-CM | POA: Insufficient documentation

## 2019-03-26 DIAGNOSIS — I252 Old myocardial infarction: Secondary | ICD-10-CM | POA: Insufficient documentation

## 2019-03-26 DIAGNOSIS — Z992 Dependence on renal dialysis: Secondary | ICD-10-CM | POA: Insufficient documentation

## 2019-03-26 DIAGNOSIS — F17210 Nicotine dependence, cigarettes, uncomplicated: Secondary | ICD-10-CM | POA: Insufficient documentation

## 2019-03-26 DIAGNOSIS — N186 End stage renal disease: Secondary | ICD-10-CM | POA: Insufficient documentation

## 2019-03-26 DIAGNOSIS — E1036 Type 1 diabetes mellitus with diabetic cataract: Secondary | ICD-10-CM | POA: Insufficient documentation

## 2019-03-26 DIAGNOSIS — E1052 Type 1 diabetes mellitus with diabetic peripheral angiopathy with gangrene: Secondary | ICD-10-CM | POA: Insufficient documentation

## 2019-03-26 DIAGNOSIS — Z955 Presence of coronary angioplasty implant and graft: Secondary | ICD-10-CM | POA: Insufficient documentation

## 2019-03-26 DIAGNOSIS — G35 Multiple sclerosis: Secondary | ICD-10-CM | POA: Insufficient documentation

## 2019-03-26 DIAGNOSIS — I2510 Atherosclerotic heart disease of native coronary artery without angina pectoris: Secondary | ICD-10-CM | POA: Insufficient documentation

## 2019-03-27 DIAGNOSIS — N2581 Secondary hyperparathyroidism of renal origin: Secondary | ICD-10-CM | POA: Diagnosis not present

## 2019-03-27 DIAGNOSIS — D509 Iron deficiency anemia, unspecified: Secondary | ICD-10-CM | POA: Diagnosis not present

## 2019-03-27 DIAGNOSIS — Z992 Dependence on renal dialysis: Secondary | ICD-10-CM | POA: Diagnosis not present

## 2019-03-27 DIAGNOSIS — N186 End stage renal disease: Secondary | ICD-10-CM | POA: Diagnosis not present

## 2019-03-28 DIAGNOSIS — L03116 Cellulitis of left lower limb: Secondary | ICD-10-CM | POA: Diagnosis not present

## 2019-03-28 DIAGNOSIS — T8754 Necrosis of amputation stump, left lower extremity: Secondary | ICD-10-CM | POA: Diagnosis not present

## 2019-03-28 DIAGNOSIS — I120 Hypertensive chronic kidney disease with stage 5 chronic kidney disease or end stage renal disease: Secondary | ICD-10-CM | POA: Diagnosis not present

## 2019-03-28 DIAGNOSIS — T8744 Infection of amputation stump, left lower extremity: Secondary | ICD-10-CM | POA: Diagnosis not present

## 2019-03-28 DIAGNOSIS — E1122 Type 2 diabetes mellitus with diabetic chronic kidney disease: Secondary | ICD-10-CM | POA: Diagnosis not present

## 2019-03-28 DIAGNOSIS — N186 End stage renal disease: Secondary | ICD-10-CM | POA: Diagnosis not present

## 2019-03-29 DIAGNOSIS — N2581 Secondary hyperparathyroidism of renal origin: Secondary | ICD-10-CM | POA: Diagnosis not present

## 2019-03-29 DIAGNOSIS — Z992 Dependence on renal dialysis: Secondary | ICD-10-CM | POA: Diagnosis not present

## 2019-03-29 DIAGNOSIS — N186 End stage renal disease: Secondary | ICD-10-CM | POA: Diagnosis not present

## 2019-03-29 DIAGNOSIS — D509 Iron deficiency anemia, unspecified: Secondary | ICD-10-CM | POA: Diagnosis not present

## 2019-04-01 DIAGNOSIS — Z992 Dependence on renal dialysis: Secondary | ICD-10-CM | POA: Diagnosis not present

## 2019-04-01 DIAGNOSIS — N186 End stage renal disease: Secondary | ICD-10-CM | POA: Diagnosis not present

## 2019-04-01 DIAGNOSIS — D509 Iron deficiency anemia, unspecified: Secondary | ICD-10-CM | POA: Diagnosis not present

## 2019-04-01 DIAGNOSIS — N2581 Secondary hyperparathyroidism of renal origin: Secondary | ICD-10-CM | POA: Diagnosis not present

## 2019-04-02 ENCOUNTER — Other Ambulatory Visit (HOSPITAL_COMMUNITY)
Admission: RE | Admit: 2019-04-02 | Discharge: 2019-04-02 | Disposition: A | Discharge status: Home / Self Care | Payer: Medicare Other | Source: Other Acute Inpatient Hospital | Attending: Internal Medicine | Admitting: Internal Medicine

## 2019-04-02 DIAGNOSIS — I2510 Atherosclerotic heart disease of native coronary artery without angina pectoris: Secondary | ICD-10-CM | POA: Diagnosis not present

## 2019-04-02 DIAGNOSIS — T8189XA Other complications of procedures, not elsewhere classified, initial encounter: Secondary | ICD-10-CM | POA: Diagnosis not present

## 2019-04-02 DIAGNOSIS — E1022 Type 1 diabetes mellitus with diabetic chronic kidney disease: Secondary | ICD-10-CM | POA: Diagnosis not present

## 2019-04-02 DIAGNOSIS — I252 Old myocardial infarction: Secondary | ICD-10-CM | POA: Diagnosis not present

## 2019-04-02 DIAGNOSIS — Z89511 Acquired absence of right leg below knee: Secondary | ICD-10-CM | POA: Diagnosis not present

## 2019-04-02 DIAGNOSIS — Z992 Dependence on renal dialysis: Secondary | ICD-10-CM | POA: Diagnosis not present

## 2019-04-02 DIAGNOSIS — N186 End stage renal disease: Secondary | ICD-10-CM | POA: Diagnosis not present

## 2019-04-02 DIAGNOSIS — Z794 Long term (current) use of insulin: Secondary | ICD-10-CM | POA: Diagnosis not present

## 2019-04-02 DIAGNOSIS — Z955 Presence of coronary angioplasty implant and graft: Secondary | ICD-10-CM | POA: Diagnosis not present

## 2019-04-02 DIAGNOSIS — E109 Type 1 diabetes mellitus without complications: Secondary | ICD-10-CM | POA: Diagnosis not present

## 2019-04-02 DIAGNOSIS — L97822 Non-pressure chronic ulcer of other part of left lower leg with fat layer exposed: Secondary | ICD-10-CM | POA: Diagnosis not present

## 2019-04-02 DIAGNOSIS — G35 Multiple sclerosis: Secondary | ICD-10-CM | POA: Diagnosis not present

## 2019-04-02 DIAGNOSIS — F17210 Nicotine dependence, cigarettes, uncomplicated: Secondary | ICD-10-CM | POA: Diagnosis not present

## 2019-04-02 DIAGNOSIS — I120 Hypertensive chronic kidney disease with stage 5 chronic kidney disease or end stage renal disease: Secondary | ICD-10-CM | POA: Diagnosis not present

## 2019-04-02 DIAGNOSIS — Z89512 Acquired absence of left leg below knee: Secondary | ICD-10-CM | POA: Diagnosis not present

## 2019-04-02 DIAGNOSIS — E1036 Type 1 diabetes mellitus with diabetic cataract: Secondary | ICD-10-CM | POA: Diagnosis not present

## 2019-04-02 DIAGNOSIS — E1052 Type 1 diabetes mellitus with diabetic peripheral angiopathy with gangrene: Secondary | ICD-10-CM | POA: Insufficient documentation

## 2019-04-02 DIAGNOSIS — Z85850 Personal history of malignant neoplasm of thyroid: Secondary | ICD-10-CM | POA: Diagnosis not present

## 2019-04-04 DIAGNOSIS — L03116 Cellulitis of left lower limb: Secondary | ICD-10-CM | POA: Diagnosis not present

## 2019-04-04 DIAGNOSIS — E1122 Type 2 diabetes mellitus with diabetic chronic kidney disease: Secondary | ICD-10-CM | POA: Diagnosis not present

## 2019-04-04 DIAGNOSIS — T8744 Infection of amputation stump, left lower extremity: Secondary | ICD-10-CM | POA: Diagnosis not present

## 2019-04-04 DIAGNOSIS — T8754 Necrosis of amputation stump, left lower extremity: Secondary | ICD-10-CM | POA: Diagnosis not present

## 2019-04-04 DIAGNOSIS — I120 Hypertensive chronic kidney disease with stage 5 chronic kidney disease or end stage renal disease: Secondary | ICD-10-CM | POA: Diagnosis not present

## 2019-04-04 DIAGNOSIS — N186 End stage renal disease: Secondary | ICD-10-CM | POA: Diagnosis not present

## 2019-04-04 LAB — AEROBIC CULTURE W GRAM STAIN (SUPERFICIAL SPECIMEN)

## 2019-04-04 LAB — AEROBIC CULTURE? (SUPERFICIAL SPECIMEN)

## 2019-04-05 DIAGNOSIS — N2581 Secondary hyperparathyroidism of renal origin: Secondary | ICD-10-CM | POA: Diagnosis not present

## 2019-04-05 DIAGNOSIS — D509 Iron deficiency anemia, unspecified: Secondary | ICD-10-CM | POA: Diagnosis not present

## 2019-04-05 DIAGNOSIS — G894 Chronic pain syndrome: Secondary | ICD-10-CM | POA: Diagnosis not present

## 2019-04-05 DIAGNOSIS — Z79891 Long term (current) use of opiate analgesic: Secondary | ICD-10-CM | POA: Diagnosis not present

## 2019-04-05 DIAGNOSIS — N186 End stage renal disease: Secondary | ICD-10-CM | POA: Diagnosis not present

## 2019-04-05 DIAGNOSIS — Z992 Dependence on renal dialysis: Secondary | ICD-10-CM | POA: Diagnosis not present

## 2019-04-06 DIAGNOSIS — G35 Multiple sclerosis: Secondary | ICD-10-CM | POA: Diagnosis not present

## 2019-04-06 DIAGNOSIS — Z7902 Long term (current) use of antithrombotics/antiplatelets: Secondary | ICD-10-CM | POA: Diagnosis not present

## 2019-04-06 DIAGNOSIS — F17210 Nicotine dependence, cigarettes, uncomplicated: Secondary | ICD-10-CM | POA: Diagnosis not present

## 2019-04-06 DIAGNOSIS — T8754 Necrosis of amputation stump, left lower extremity: Secondary | ICD-10-CM | POA: Diagnosis not present

## 2019-04-06 DIAGNOSIS — Z794 Long term (current) use of insulin: Secondary | ICD-10-CM | POA: Diagnosis not present

## 2019-04-06 DIAGNOSIS — T8744 Infection of amputation stump, left lower extremity: Secondary | ICD-10-CM | POA: Diagnosis not present

## 2019-04-06 DIAGNOSIS — I120 Hypertensive chronic kidney disease with stage 5 chronic kidney disease or end stage renal disease: Secondary | ICD-10-CM | POA: Diagnosis not present

## 2019-04-06 DIAGNOSIS — Z9049 Acquired absence of other specified parts of digestive tract: Secondary | ICD-10-CM | POA: Diagnosis not present

## 2019-04-06 DIAGNOSIS — L03116 Cellulitis of left lower limb: Secondary | ICD-10-CM | POA: Diagnosis not present

## 2019-04-06 DIAGNOSIS — Z89611 Acquired absence of right leg above knee: Secondary | ICD-10-CM | POA: Diagnosis not present

## 2019-04-06 DIAGNOSIS — F329 Major depressive disorder, single episode, unspecified: Secondary | ICD-10-CM | POA: Diagnosis not present

## 2019-04-06 DIAGNOSIS — Z89612 Acquired absence of left leg above knee: Secondary | ICD-10-CM | POA: Diagnosis not present

## 2019-04-06 DIAGNOSIS — Z89022 Acquired absence of left finger(s): Secondary | ICD-10-CM | POA: Diagnosis not present

## 2019-04-06 DIAGNOSIS — E1122 Type 2 diabetes mellitus with diabetic chronic kidney disease: Secondary | ICD-10-CM | POA: Diagnosis not present

## 2019-04-06 DIAGNOSIS — N186 End stage renal disease: Secondary | ICD-10-CM | POA: Diagnosis not present

## 2019-04-08 DIAGNOSIS — N186 End stage renal disease: Secondary | ICD-10-CM | POA: Diagnosis not present

## 2019-04-08 DIAGNOSIS — E109 Type 1 diabetes mellitus without complications: Secondary | ICD-10-CM | POA: Diagnosis not present

## 2019-04-08 DIAGNOSIS — Z992 Dependence on renal dialysis: Secondary | ICD-10-CM | POA: Diagnosis not present

## 2019-04-08 DIAGNOSIS — N2581 Secondary hyperparathyroidism of renal origin: Secondary | ICD-10-CM | POA: Diagnosis not present

## 2019-04-08 DIAGNOSIS — D509 Iron deficiency anemia, unspecified: Secondary | ICD-10-CM | POA: Diagnosis not present

## 2019-04-08 DIAGNOSIS — Z794 Long term (current) use of insulin: Secondary | ICD-10-CM | POA: Diagnosis not present

## 2019-04-09 DIAGNOSIS — M47816 Spondylosis without myelopathy or radiculopathy, lumbar region: Secondary | ICD-10-CM | POA: Diagnosis not present

## 2019-04-09 DIAGNOSIS — M169 Osteoarthritis of hip, unspecified: Secondary | ICD-10-CM | POA: Diagnosis not present

## 2019-04-09 DIAGNOSIS — M25561 Pain in right knee: Secondary | ICD-10-CM | POA: Diagnosis not present

## 2019-04-09 DIAGNOSIS — G894 Chronic pain syndrome: Secondary | ICD-10-CM | POA: Diagnosis not present

## 2019-04-10 DIAGNOSIS — D509 Iron deficiency anemia, unspecified: Secondary | ICD-10-CM | POA: Diagnosis not present

## 2019-04-10 DIAGNOSIS — N186 End stage renal disease: Secondary | ICD-10-CM | POA: Diagnosis not present

## 2019-04-10 DIAGNOSIS — N2581 Secondary hyperparathyroidism of renal origin: Secondary | ICD-10-CM | POA: Diagnosis not present

## 2019-04-10 DIAGNOSIS — Z992 Dependence on renal dialysis: Secondary | ICD-10-CM | POA: Diagnosis not present

## 2019-04-12 ENCOUNTER — Other Ambulatory Visit: Payer: Self-pay

## 2019-04-12 ENCOUNTER — Ambulatory Visit (INDEPENDENT_AMBULATORY_CARE_PROVIDER_SITE_OTHER): Payer: Medicare Other | Admitting: Vascular Surgery

## 2019-04-12 ENCOUNTER — Encounter: Payer: Self-pay | Admitting: Vascular Surgery

## 2019-04-12 VITALS — BP 155/70 | HR 66 | Resp 20 | Ht <= 58 in | Wt 202.0 lb

## 2019-04-12 DIAGNOSIS — D509 Iron deficiency anemia, unspecified: Secondary | ICD-10-CM | POA: Diagnosis not present

## 2019-04-12 DIAGNOSIS — N186 End stage renal disease: Secondary | ICD-10-CM | POA: Diagnosis not present

## 2019-04-12 DIAGNOSIS — T8789 Other complications of amputation stump: Secondary | ICD-10-CM

## 2019-04-12 DIAGNOSIS — N2581 Secondary hyperparathyroidism of renal origin: Secondary | ICD-10-CM | POA: Diagnosis not present

## 2019-04-12 DIAGNOSIS — Z992 Dependence on renal dialysis: Secondary | ICD-10-CM | POA: Diagnosis not present

## 2019-04-12 NOTE — Progress Notes (Signed)
Patient ID: [name], female   DOB: Dec 05, 1970, 48 y.o.   MRN: [id number]  Reason for Consult: Follow-up   Referred by Angela Burly, MD  Subjective:     HPI:  Angela Soto is a 48 y.o. female history of bilateral below-knee amputations also has a history of probable bilateral occluded iliac arteries and aorta.  She is on dialysis and has had a finger amputated in the past.  Currently dialyzes via left upper arm AV fistula which she states is working well although she has some bleeding recently.  Mostly feeling the wound in her leg which is causing her significant pain preventing her from sleep.  Past Medical History:  Diagnosis Date  . Coronary artery disease    a. s/p prior PCI in 1998 b. 10/2017: cath showing severe two-vessel CAD with heavy calcification along the LCx and 100% CTO of RCA with left to right collaterals noted. No good options for PCI. Medical management recommended.   . Environmental allergies    uses inhalers  . ESRD (end stage renal disease) on dialysis [facility])    "started dialysis on 10/08/13; MWF; [facility]; [place], [place]"  . GERD (gastroesophageal reflux disease)   . VZDGLOVF(643.3)    "weekly" (11/09/2017)  . High cholesterol   . Hypertension   . Hypothyroidism   . Multiple sclerosis ([place])    just diagnosed 2014  . Myocardial infarction ([place]) 1998  . Nonalcoholic steatohepatitis (NASH)   . Peripheral vascular disease ([place])    aortic artery occlusion  . PONV (postoperative nausea and vomiting)   . Thyroid cancer ([place])   . Type 1 diabetes mellitus ([place])   . Umbilical hernia    Family History  Problem Relation Age of Onset  . Diabetes Mellitus II Mother   . Ovarian cancer Mother   . CAD Father 57  . Diabetes Mellitus II Father    Past Surgical History:  Procedure Laterality Date  . ABDOMINAL SURGERY  2006   aortic artery occluded, had to "clean it out"  . AMPUTATION Left 09/22/2014   Procedure: AMPUTATION LEFT FOURTH FINGER;  Surgeon: Angela Barker, MD;  Location: [place];  Service: Plastics;  Laterality: Left;  . AV FISTULA PLACEMENT Left 09/24/2013   Procedure: LEFT UPPER EXTREMITY ARTERIOVENOUS (AV) FISTULA CREATION BRACHIAL/CEPHALIC;  Surgeon: Angela Dutch, MD;  Location: [place];  Service: Vascular;  Laterality: Left;  . BASCILIC VEIN TRANSPOSITION Left 02/18/2014   Procedure: BASILIC VEIN TRANSPOSITION - 2ND STAGE LEFT ARM;  Surgeon: Angela Dutch, MD;  Location: [place];  Service: Vascular;  Laterality: Left;  . CARDIAC CATHETERIZATION     "numerous"  . CARDIAC CATHETERIZATION  11/09/2017  . CORONARY ANGIOPLASTY    . CORONARY ANGIOPLASTY WITH STENT PLACEMENT     "think I have a total of 3 stents" (11/09/2017)  . INSERTION OF DIALYSIS CATHETER Right 10/07/2013   Procedure: INSERTION OF DIALYSIS CATHETER;  Surgeon: Angela Van Meter, MD;  Location: [place];  Service: Vascular;  Laterality: Right;  . IRRIGATION AND DEBRIDEMENT ABSCESS Right   . LEFT HEART CATH AND CORONARY ANGIOGRAPHY N/A 11/09/2017   Procedure: LEFT HEART CATH AND CORONARY ANGIOGRAPHY;  Surgeon: Angela Blanks, MD;  Location: [place] CV LAB;  Service: Cardiovascular;  Laterality: N/A;  . LEG AMPUTATION BELOW KNEE Bilateral 2008-2010   left-right  . PARATHYROIDECTOMY    . SHUNTOGRAM Left 04/17/2014   Procedure: FISTULOGRAM;  Surgeon: Angela Jellico, MD;  Location: [facility] CATH LAB;  Service: Cardiovascular;  Laterality: Left;  . THYROIDECTOMY      Short Social History:  Social History   Tobacco Use  . Smoking status: Current Every Day Smoker    Packs/day: 1.00    Years: 32.00    Pack years: 32.00    Types: Cigarettes  . Smokeless tobacco: Never Used  Substance Use Topics  . Alcohol use: No    Allergies  Allergen Reactions  . Metformin And Related Diarrhea and Nausea And Vomiting  . Chantix [Varenicline]     "EVIL DREAMS"  . Ibuprofen Other (See Comments)    Cannot take because of low kidney function    Current Outpatient Medications   Medication Sig Dispense Refill  . amoxicillin-clavulanate (AUGMENTIN) 500-125 MG tablet TAKE 1 TABLET BY MOUTH TWICE DAILY with second DOSE post dialysis FOR 10 DAYS    . aspirin EC 81 MG tablet Take 1 tablet (81 mg total) by mouth daily. (Patient taking differently: Take 81 mg by mouth See admin instructions. Daily on Sunday, Tuesday, Thursday and Saturday)    . clopidogrel (PLAVIX) 75 MG tablet Take 75 mg by mouth See admin instructions. At Bedtime on Sunday, Tuesday, Thursday and Saturday    . insulin glargine (LANTUS) 100 UNIT/ML injection Inject 52 Units into the skin at bedtime.     . insulin lispro (HUMALOG) 100 UNIT/ML injection Inject 12 Units into the skin 3 (three) times daily before meals.     . isosorbide mononitrate (IMDUR) 60 MG 24 hr tablet Take 60 mg in the am, and 30 mg ( 1/2 tablet) in the pm 135 tablet 3  . levothyroxine (SYNTHROID, LEVOTHROID) 150 MCG tablet Take 200 mcg by mouth daily before breakfast.     . lidocaine-prilocaine (EMLA) cream Apply 1 application topically as needed (for pain/ apply prior to dialysis).    . metoprolol succinate (TOPROL-XL) 100 MG 24 hr tablet Take 100 mg by mouth as directed. Takes Tues/Thurs/Sat/Sun Non dialysis days    . nicotine polacrilex (COMMIT) 4 MG lozenge Take 1 lozenge (4 mg total) by mouth as needed for smoking cessation. 108 tablet 3  . Omega-3 Fatty Acids (FISH OIL) 1000 MG CAPS Take 1,000 mg by mouth 3 (three) times daily.     . Oxycodone HCl 10 MG TABS Take 10 mg by mouth every 6 (six) hours as needed (for pain).     . promethazine (PHENERGAN) 25 MG tablet Take 25 mg by mouth every 6 (six) hours as needed for nausea or vomiting.    . rosuvastatin (CRESTOR) 10 MG tablet TAKE 1 TABLET BY MOUTH DAILY 90 tablet 3  . sevelamer carbonate (RENVELA) 800 MG tablet Take 800-1,600 mg by mouth See admin instructions. Take 1 tablet  (800 mg) with snacks and 2 tablets (1600 mg) with meals on Sunday, Tuesday, Thursday and Saturday    . vancomycin  (VANCOCIN) 1 GM/200ML SOLN Inject 200 mLs (1,000 mg total) into the vein every Monday, Wednesday, and Friday with hemodialysis. 4000 mL    No current facility-administered medications for this visit.     Review of Systems  Constitutional: Positive for chills and fatigue.  HENT: HENT negative.  Eyes: Eyes negative.  Respiratory: Respiratory negative.  Cardiovascular: Positive for leg swelling.  GI: Gastrointestinal negative.  Musculoskeletal: Musculoskeletal negative.  Skin: Positive for wound.  Neurological: Neurological negative. Hematologic: Hematologic/lymphatic negative.        Objective:  Objective   Vitals:   04/12/19 1404  BP: (!) 155/70  Pulse: 66  Resp: 20  SpO2: 95%  Weight: 202 lb (91.6 kg)  Height: 4\' 3"  (1.295 m)   Body mass index is 54.6 kg/m.  Physical Exam HENT:     Head: Normocephalic.  Eyes:     Pupils: Pupils are equal, round, and reactive to light.  Cardiovascular:     Rate and Rhythm: Normal rate.     Pulses:          Femoral pulses are 0 on the right side and 0 on the left side.      Popliteal pulses are 0 on the right side and 0 on the left side.  Pulmonary:     Effort: Pulmonary effort is normal.  Abdominal:     General: Abdomen is flat.  Musculoskeletal:     Comments: Bilateral below-knee amputations Left below-knee amputation on the medial aspect has approximately 8 cm round necrotic ulceration which has some white components to it.  Skin:    Capillary Refill: Capillary refill takes less than 2 seconds.  Neurological:     Mental Status: She is alert.  Psychiatric:        Thought Content: Thought content normal.        Judgment: Judgment normal.     Data: No studies today     Assessment/Plan:    48 year old female presents for evaluation of both her upper extremity dialysis access in her left below-knee amputation site wound.  Regarding her dialysis access she states that is currently working and although she has had bleeding  recently she does not want any intervention at this time but I did discuss possible fistulogram in the future.  Regarding her left below-knee amputation site there is a large ulcer which I believe is going to require above-knee amputation.  She is going to discuss with her husband and they want to attempt debridement but I told her if there is underlying bone in the wound it will certainly not heal and will require ongoing wound care and further surgeries.  More than likely she is going to choose above-knee amputation on the left and we can schedule this when she is ready.      Waynetta Sandy MD [facility]

## 2019-04-15 ENCOUNTER — Encounter: Payer: Self-pay | Admitting: *Deleted

## 2019-04-15 ENCOUNTER — Encounter (HOSPITAL_COMMUNITY): Payer: Self-pay | Admitting: *Deleted

## 2019-04-15 ENCOUNTER — Other Ambulatory Visit: Payer: Self-pay

## 2019-04-15 DIAGNOSIS — D509 Iron deficiency anemia, unspecified: Secondary | ICD-10-CM | POA: Diagnosis not present

## 2019-04-15 DIAGNOSIS — Z992 Dependence on renal dialysis: Secondary | ICD-10-CM | POA: Diagnosis not present

## 2019-04-15 DIAGNOSIS — N2581 Secondary hyperparathyroidism of renal origin: Secondary | ICD-10-CM | POA: Diagnosis not present

## 2019-04-15 DIAGNOSIS — N186 End stage renal disease: Secondary | ICD-10-CM | POA: Diagnosis not present

## 2019-04-15 NOTE — Progress Notes (Signed)
Denies chest pain or shob. Reports cardiologist is [facility]. Patient states her insulin needs have signifiantly decreased and that she is adjusting her lantus and novolog based on her CBG's. I requested that she take 80% of her adjusted lantus dose tonight. Other instructions below.     How do I manage my blood sugar before surgery? . Check your blood sugar at least 4 times a day, starting 2 days before surgery, to make sure that the level is not too high or low. o Check your blood sugar the morning of your surgery when you wake up and every 2 hours until you get to the Short Stay unit. . If your blood sugar is less than 70 mg/dL, you will need to treat for low blood sugar: o Do not take insulin. o Treat a low blood sugar (less than 70 mg/dL) with  cup of clear juice (cranberry or apple), 4 glucose tablets, OR glucose gel. Recheck blood sugar in 15 minutes after treatment (to make sure it is greater than 70 mg/dL). If your blood sugar is not greater than 70 mg/dL on recheck, call [phone] o  for further instructions. . Report your blood sugar to the short stay nurse when you get to Short Stay.  . If you are admitted to the hospital after surgery: o Your blood sugar will be checked by the staff and you will probably be given insulin after surgery (instead of oral diabetes medicines) to make sure you have good blood sugar levels. o The goal for blood sugar control after surgery is 80-180 mg/dL.   . If your CBG is greater than 220 mg/dL, you may take  of your sliding scale (correction) dose of insulin.

## 2019-04-16 ENCOUNTER — Inpatient Hospital Stay (HOSPITAL_COMMUNITY): Admission: RE | Admit: 2019-04-16 | Payer: Medicare Other | Source: Home / Self Care | Admitting: Vascular Surgery

## 2019-04-16 ENCOUNTER — Encounter (HOSPITAL_COMMUNITY): Payer: Self-pay | Admitting: Physician Assistant

## 2019-04-16 ENCOUNTER — Other Ambulatory Visit: Payer: Self-pay | Admitting: *Deleted

## 2019-04-16 DIAGNOSIS — N186 End stage renal disease: Secondary | ICD-10-CM | POA: Diagnosis not present

## 2019-04-16 DIAGNOSIS — Z992 Dependence on renal dialysis: Secondary | ICD-10-CM | POA: Diagnosis not present

## 2019-04-16 SURGERY — AMPUTATION, ABOVE KNEE
Anesthesia: Choice | Laterality: Left

## 2019-04-16 NOTE — Progress Notes (Signed)
Anesthesia Chart Review: SAME DAY WORKUP   Case: [id number] Date/Time: 04/17/19 1119   Procedure: DEBRIDE BELOW KNEE STUMP VERSUS AMPUTATION ABOVE KNEE (Left )   Anesthesia type: Choice   Pre-op diagnosis: NONVIABLE TISSUE LEFT BELOW KNEE AMPUTATION STUMP   Location: [place] OR ROOM 12 / [place] OR   Surgeon: Waynetta Sandy, MD      DISCUSSION: 48 yo female smoker. Pertinent hx includes CAD (s/p prior PCI in 1998, recent catheterization in 10/2017 showing severe two-vessel CAD with heavy calcification along the LCx and 100% CTO of RCA with left to right collaterals noted -->medical management recommended as not felt to be a CABG candidate), ESRD on HD, HTN, HLD, Hypothyroidism, IDDM, PVD (s/p bilateral BKA), NASH, GERD.  Follows with Dr. Bronson Ing for hx of CAD s/p PCI in 1998. She was last seen 10/02/18 by Bernerd Pho, PA-C. Per note, pt continues to have stable angina, she denied having to use sublingual NTG, Imdur recently increased to 90mg  daily. She had a cath 10/2017 with no suitable target for intervention. Per report "She has complex CAD with no good options for revascularization. Her RCA is chronically occluded and fills from collaterals. The entire Circumflex is diffusely diseased and heavily calcified with a distal chronic total occlusion of the OM branch. I would not approach this vessel with PCI. If PCI of the Circumflex were considered, we would have to perform atherectomy of the entire vessel and likely could not cross the distal CTO. The entire vessel would need to be stented and the risk of restenosis would be very high in this patient with diabetes and ongoing tobacco abuse. She is not a candidate for CABG given her comorbid conditions including ESRD and the lack of graft conduit given bilateral lower extremity amputations. At this time, I would attempt to control her symptoms with medical therapy. She should consider stopping smoking as well."  Will need DOS eval by assigned  anesthesiologist.  VS: LMP 05/24/2013   PROVIDERS: Neale Burly, MD is PCP  Kate Sable, MD is Cardiologist  LABS: Will need DOS labs.  Labs Reviewed - No data to display   IMAGES: CHEST  2 VIEW 11/09/17  COMPARISON:  Prior radiograph from 01/22/2014.  FINDINGS: Mild cardiomegaly, stable.  Mediastinal silhouette normal.  Lungs mildly hypoinflated. Streaky left basilar opacity favored to reflect atelectasis. Mild diffuse interstitial prominence favored to be related to mild interstitial edema, although acute bronchiolitis/pneumonitis could also be considered. No frank alveolar edema. No pleural effusion. No pneumothorax.  No acute osseus abnormality.  IMPRESSION: 1. Stable cardiomegaly with mild diffuse interstitial prominence, favored to reflect mild pulmonary interstitial congestion. Bronchiolitis/atypical pneumonitis could be considered in the correct clinical setting. 2. Shallow lung inflation with superimposed mild bibasilar atelectasis.  EKG: 11/09/17: Sinus rhythm. Rate 68. Probable left atrial enlargement. Nonspecific intraventricular conduction delay. Borderline T abnormalities, inferior leads  CV: Cath 11/09/17:  Mid RCA to Dist RCA lesion is 100% stenosed.   1. Severe double vessel CAD 2. The LAD is a large caliber vessel that courses to the apex. The mid vessel has a long segment of moderate, calcific stenosis. This does not appear to be flow limiting. The first diagonal is a small caliber vessel with moderate ostial stenosis. The second diagonal branch is a small caliber vessel with mild plaque.  3. The Circumflex is a moderate caliber, heavily calcified vessel from the ostium down into the obtuse marginal branches. There is moderately severe, heavily calcified stenosis from the proximal vessel down  into the most distal obtuse marginal branch. The obtuse marginal branch is completely occluded. It appears that the distal segment of this branch  fills from bridging collaterals. The vessel is small in caliber after it reconstitutes.  4. The RCA is a large dominant vessel with 100% occlusion just beyond the ostium. The distal vessel and distal branches are seen to fill from left to right collaterals supplied by septal perforators.  5. LV systolic function is overall preserved with segmental wall motion abnormality. LVEF estimated around 45-50%.   Recommendations: She has complex CAD with no good options for revascularization. Her RCA is chronically occluded and fills from collaterals. The entire Circumflex is diffusely diseased and heavily calcified with a distal chronic total occlusion of the OM branch. I would not approach this vessel with PCI. If PCI of the Circumflex were considered, we would have to perform atherectomy of the entire vessel and likely could not cross the distal CTO. The entire vessel would need to be stented and the risk of restenosis would be very high in this patient with diabetes and ongoing tobacco abuse. She is not a candidate for CABG given her comorbid conditions including ESRD and the lack of graft conduit given bilateral lower extremity amputations. At this time, I would attempt to control her symptoms with medical therapy. She should consider stopping smoking as well.   TTE 11/09/17:  - Left ventricle: The cavity size was normal. Wall thickness was   increased in a pattern of mild LVH. Systolic function was normal.   The estimated ejection fraction was in the range of 50% to 55%.   There is hypokinesis of the inferolateral myocardium. Features   are consistent with a pseudonormal left ventricular filling   pattern, with concomitant abnormal relaxation and increased   filling pressure (grade 2 diastolic dysfunction). Doppler   parameters are consistent with high ventricular filling pressure. - Mitral valve: Calcified annulus. - Left atrium: The atrium was severely dilated.  Impressions:  - Hypokinesis of  the inferolateral wall with overall low normal LV   systolic function; mild LVH; moderate diastolic dysfunction;   severe LAE.    Past Medical History:  Diagnosis Date  . Coronary artery disease    a. s/p prior PCI in 1998 b. 10/2017: cath showing severe two-vessel CAD with heavy calcification along the LCx and 100% CTO of RCA with left to right collaterals noted. No good options for PCI. Medical management recommended.   . Environmental allergies    uses inhalers  . ESRD (end stage renal disease) on dialysis [facility])    "started dialysis on 10/08/13; MWF; [facility]; [place], [place]"  . GERD (gastroesophageal reflux disease)   . PJKDTOIZ(124.5)    "weekly" (11/09/2017)  . High cholesterol   . Hypertension   . Hypothyroidism   . Multiple sclerosis ([place])    just diagnosed 2014  . Myocardial infarction ([place]) 1998  . Nonalcoholic steatohepatitis (NASH)   . Peripheral vascular disease ([place])    aortic artery occlusion  . PONV (postoperative nausea and vomiting)   . Thyroid cancer ([place])   . Type 1 diabetes mellitus ([place])   . Umbilical hernia     Past Surgical History:  Procedure Laterality Date  . ABDOMINAL SURGERY  2006   aortic artery occluded, had to "clean it out"  . AMPUTATION Left 09/22/2014   Procedure: AMPUTATION LEFT FOURTH FINGER;  Surgeon: Dayna Barker, MD;  Location: [place];  Service: Plastics;  Laterality: Left;  . AV FISTULA PLACEMENT Left  09/24/2013   Procedure: LEFT UPPER EXTREMITY ARTERIOVENOUS (AV) FISTULA CREATION BRACHIAL/CEPHALIC;  Surgeon: Elam Dutch, MD;  Location: [place];  Service: Vascular;  Laterality: Left;  . BASCILIC VEIN TRANSPOSITION Left 02/18/2014   Procedure: BASILIC VEIN TRANSPOSITION - 2ND STAGE LEFT ARM;  Surgeon: Elam Dutch, MD;  Location: [place];  Service: Vascular;  Laterality: Left;  . CARDIAC CATHETERIZATION     "numerous"  . CARDIAC CATHETERIZATION  11/09/2017  . CORONARY ANGIOPLASTY    . CORONARY ANGIOPLASTY WITH STENT PLACEMENT     "think I  have a total of 3 stents" (11/09/2017)  . INSERTION OF DIALYSIS CATHETER Right 10/07/2013   Procedure: INSERTION OF DIALYSIS CATHETER;  Surgeon: Conrad Muskogee, MD;  Location: [place];  Service: Vascular;  Laterality: Right;  . IRRIGATION AND DEBRIDEMENT ABSCESS Right   . LEFT HEART CATH AND CORONARY ANGIOGRAPHY N/A 11/09/2017   Procedure: LEFT HEART CATH AND CORONARY ANGIOGRAPHY;  Surgeon: Burnell Blanks, MD;  Location: [place] CV LAB;  Service: Cardiovascular;  Laterality: N/A;  . LEG AMPUTATION BELOW KNEE Bilateral 2008-2010   left-right  . PARATHYROIDECTOMY    . SHUNTOGRAM Left 04/17/2014   Procedure: FISTULOGRAM;  Surgeon: Conrad Home, MD;  Location: [facility] CATH LAB;  Service: Cardiovascular;  Laterality: Left;  . THYROIDECTOMY      MEDICATIONS: No current facility-administered medications for this encounter.    Marland Kitchen aspirin EC 81 MG tablet  . calcium acetate (PHOSLO) 667 MG capsule  . clopidogrel (PLAVIX) 75 MG tablet  . famotidine (PEPCID) 40 MG tablet  . folic acid-vitamin b complex-vitamin c-selenium-zinc (DIALYVITE) 3 MG TABS tablet  . furosemide (LASIX) 80 MG tablet  . gabapentin (NEURONTIN) 100 MG capsule  . insulin glargine (LANTUS) 100 UNIT/ML injection  . insulin lispro (HUMALOG) 100 UNIT/ML injection  . isosorbide mononitrate (IMDUR) 60 MG 24 hr tablet  . levothyroxine (SYNTHROID) 200 MCG tablet  . lidocaine-prilocaine (EMLA) cream  . metoprolol succinate (TOPROL-XL) 100 MG 24 hr tablet  . Omega-3 Fatty Acids (FISH OIL) 1000 MG CAPS  . Oxycodone HCl 10 MG TABS  . rosuvastatin (CRESTOR) 10 MG tablet  . topiramate (TOPAMAX) 50 MG tablet  . nicotine polacrilex (COMMIT) 4 MG lozenge    Wynonia Musty [facility] Short Stay Center/Anesthesiology Phone [phone] 04/16/2019 4:00 PM

## 2019-04-16 NOTE — Anesthesia Preprocedure Evaluation (Deleted)
Anesthesia Evaluation  Patient identified by MRN, date of birth, ID band Patient awake    Reviewed: Allergy & Precautions, NPO status , Patient's Chart, lab work & pertinent test results, reviewed documented beta blocker date and time   History of Anesthesia Complications (+) PONV  Airway Mallampati: II  TM Distance: >3 FB     Dental  (+) Dental Advisory Given   Pulmonary Current Smoker,    breath sounds clear to auscultation       Cardiovascular hypertension, Pt. on medications and Pt. on home beta blockers + CAD, + Past MI and + Peripheral Vascular Disease   Rhythm:Regular Rate:Normal     Neuro/Psych  Headaches,    GI/Hepatic GERD  ,(+) Hepatitis - (NASH)  Endo/Other  diabetesHypothyroidism   Renal/GU ESRF and DialysisRenal disease     Musculoskeletal   Abdominal   Peds  Hematology   Anesthesia Other Findings   Reproductive/Obstetrics                            Lab Results  Component Value Date   WBC 11.3 (H) 04/17/2019   HGB 15.0 04/17/2019   HCT 40.0 04/17/2019   MCV 87.7 04/17/2019   PLT 205 04/17/2019   Lab Results  Component Value Date   CREATININE 3.74 (H) 11/10/2017   BUN 30 (H) 11/10/2017   NA 136 11/10/2017   K 3.4 (L) 11/10/2017   CL 98 (L) 11/10/2017   CO2 25 11/10/2017    Anesthesia Physical Anesthesia Plan  ASA: III  Anesthesia Plan: General   Post-op Pain Management:    Induction: Intravenous  PONV Risk Score and Plan: 3 and Dexamethasone, Ondansetron and Treatment may vary due to age or medical condition  Airway Management Planned: LMA  Additional Equipment:   Intra-op Plan:   Post-operative Plan: Extubation in OR  Informed Consent: I have reviewed the patients History and Physical, chart, labs and discussed the procedure including the risks, benefits and alternatives for the proposed anesthesia with the patient or authorized representative who  has indicated his/her understanding and acceptance.     Dental advisory given  Plan Discussed with: CRNA  Anesthesia Plan Comments: (See PAT note by Karoline Caldwell, PA-C )       Anesthesia Quick Evaluation

## 2019-04-16 NOTE — Progress Notes (Signed)
Pt stated that she still has instructions that were provided by RN last night. Pt made aware of new arrival time of 0830 tomorrow. Pt stated that nothing has changed in her history since last night. Pt denies SOB and chest pain. Pt denies having an EKG and chest x ray within the last year. Pt stated that her last dose of Plavix was Sunday as instructed. Pt stated that she takes Imdur at HS. Pt reminded to stop taking vitamins, fish oil and herbal medications. Do not take any NSAIDs ie: Ibuprofen, Advil, Naproxen (Aleve), Motrin, BC and Goody Powder. Pt verbalized understanding of all pre-op instructions. PA, Anesthesiology, made aware of pt history; see note.

## 2019-04-17 ENCOUNTER — Other Ambulatory Visit: Payer: Self-pay

## 2019-04-17 ENCOUNTER — Encounter (HOSPITAL_COMMUNITY): Payer: Self-pay

## 2019-04-17 ENCOUNTER — Ambulatory Visit (HOSPITAL_COMMUNITY)
Admission: RE | Admit: 2019-04-17 | Discharge: 2019-04-17 | Disposition: A | Discharge status: Home / Self Care | Payer: Medicare Other | Attending: Vascular Surgery | Admitting: Vascular Surgery

## 2019-04-17 ENCOUNTER — Encounter (HOSPITAL_COMMUNITY)
Admission: RE | Disposition: A | Discharge status: Home / Self Care | Payer: Self-pay | Source: Home / Self Care | Attending: Vascular Surgery

## 2019-04-17 DIAGNOSIS — Z01818 Encounter for other preprocedural examination: Secondary | ICD-10-CM | POA: Diagnosis not present

## 2019-04-17 DIAGNOSIS — I2510 Atherosclerotic heart disease of native coronary artery without angina pectoris: Secondary | ICD-10-CM | POA: Insufficient documentation

## 2019-04-17 DIAGNOSIS — K219 Gastro-esophageal reflux disease without esophagitis: Secondary | ICD-10-CM | POA: Insufficient documentation

## 2019-04-17 DIAGNOSIS — Z794 Long term (current) use of insulin: Secondary | ICD-10-CM | POA: Diagnosis not present

## 2019-04-17 DIAGNOSIS — Z992 Dependence on renal dialysis: Secondary | ICD-10-CM | POA: Diagnosis not present

## 2019-04-17 DIAGNOSIS — E875 Hyperkalemia: Secondary | ICD-10-CM | POA: Diagnosis not present

## 2019-04-17 DIAGNOSIS — I252 Old myocardial infarction: Secondary | ICD-10-CM | POA: Insufficient documentation

## 2019-04-17 DIAGNOSIS — K7581 Nonalcoholic steatohepatitis (NASH): Secondary | ICD-10-CM | POA: Diagnosis not present

## 2019-04-17 DIAGNOSIS — F17200 Nicotine dependence, unspecified, uncomplicated: Secondary | ICD-10-CM | POA: Insufficient documentation

## 2019-04-17 DIAGNOSIS — Z955 Presence of coronary angioplasty implant and graft: Secondary | ICD-10-CM | POA: Diagnosis not present

## 2019-04-17 DIAGNOSIS — E785 Hyperlipidemia, unspecified: Secondary | ICD-10-CM | POA: Insufficient documentation

## 2019-04-17 DIAGNOSIS — Z1159 Encounter for screening for other viral diseases: Secondary | ICD-10-CM | POA: Insufficient documentation

## 2019-04-17 DIAGNOSIS — N186 End stage renal disease: Secondary | ICD-10-CM | POA: Diagnosis not present

## 2019-04-17 DIAGNOSIS — I120 Hypertensive chronic kidney disease with stage 5 chronic kidney disease or end stage renal disease: Secondary | ICD-10-CM | POA: Diagnosis not present

## 2019-04-17 DIAGNOSIS — Z79890 Hormone replacement therapy (postmenopausal): Secondary | ICD-10-CM | POA: Insufficient documentation

## 2019-04-17 DIAGNOSIS — E1051 Type 1 diabetes mellitus with diabetic peripheral angiopathy without gangrene: Secondary | ICD-10-CM | POA: Diagnosis not present

## 2019-04-17 DIAGNOSIS — Z89511 Acquired absence of right leg below knee: Secondary | ICD-10-CM | POA: Diagnosis not present

## 2019-04-17 DIAGNOSIS — G35 Multiple sclerosis: Secondary | ICD-10-CM | POA: Insufficient documentation

## 2019-04-17 DIAGNOSIS — E890 Postprocedural hypothyroidism: Secondary | ICD-10-CM | POA: Diagnosis not present

## 2019-04-17 DIAGNOSIS — Z7982 Long term (current) use of aspirin: Secondary | ICD-10-CM | POA: Diagnosis not present

## 2019-04-17 DIAGNOSIS — Z85850 Personal history of malignant neoplasm of thyroid: Secondary | ICD-10-CM | POA: Diagnosis not present

## 2019-04-17 DIAGNOSIS — Z89512 Acquired absence of left leg below knee: Secondary | ICD-10-CM | POA: Insufficient documentation

## 2019-04-17 DIAGNOSIS — E1022 Type 1 diabetes mellitus with diabetic chronic kidney disease: Secondary | ICD-10-CM | POA: Diagnosis not present

## 2019-04-17 DIAGNOSIS — Z79899 Other long term (current) drug therapy: Secondary | ICD-10-CM | POA: Insufficient documentation

## 2019-04-17 DIAGNOSIS — Z7902 Long term (current) use of antithrombotics/antiplatelets: Secondary | ICD-10-CM | POA: Insufficient documentation

## 2019-04-17 LAB — CBC
HCT: 40 % (ref 36.0–46.0)
Hemoglobin: 15 g/dL (ref 12.0–15.0)
MCH: 32.9 pg (ref 26.0–34.0)
MCHC: 37.5 g/dL — ABNORMAL HIGH (ref 30.0–36.0)
MCV: 87.7 fL (ref 80.0–100.0)
Platelets: 205 10*3/uL (ref 150–400)
RBC: 4.56 MIL/uL (ref 3.87–5.11)
RDW: 15.6 % — ABNORMAL HIGH (ref 11.5–15.5)
WBC: 11.3 10*3/uL — ABNORMAL HIGH (ref 4.0–10.5)
nRBC: 0.4 % — ABNORMAL HIGH (ref 0.0–0.2)

## 2019-04-17 LAB — BASIC METABOLIC PANEL
Anion gap: 16 — ABNORMAL HIGH (ref 5–15)
BUN: 64 mg/dL — ABNORMAL HIGH (ref 6–20)
CO2: 25 mmol/L (ref 22–32)
Calcium: 8.3 mg/dL — ABNORMAL LOW (ref 8.9–10.3)
Chloride: 94 mmol/L — ABNORMAL LOW (ref 98–111)
Creatinine, Ser: 8.86 mg/dL — ABNORMAL HIGH (ref 0.44–1.00)
GFR calc Af Amer: 6 mL/min — ABNORMAL LOW (ref >60)
GFR calc non Af Amer: 5 mL/min — ABNORMAL LOW (ref >60)
Glucose, Bld: 88 mg/dL (ref 70–99)
Potassium: 6.6 mmol/L (ref 3.5–5.1)
Sodium: 135 mmol/L (ref 135–145)

## 2019-04-17 LAB — GLUCOSE, CAPILLARY: Glucose-Capillary: 93 mg/dL (ref 70–99)

## 2019-04-17 LAB — SARS CORONAVIRUS 2 BY RT PCR (HOSPITAL ORDER, PERFORMED IN ~~LOC~~ HOSPITAL LAB): SARS Coronavirus 2: NEGATIVE

## 2019-04-17 SURGERY — AMPUTATION, ABOVE KNEE
Anesthesia: Choice | Laterality: Left

## 2019-04-17 MED ORDER — CHLORHEXIDINE GLUCONATE 4 % EX LIQD: 60.0000 mL | Freq: Once | CUTANEOUS | Status: DC

## 2019-04-17 MED ORDER — CEFAZOLIN SODIUM-DEXTROSE 2-4 GM/100ML-% IV SOLN: 2.0000 g | INTRAVENOUS | Status: DC

## 2019-04-17 MED ORDER — CEFAZOLIN SODIUM-DEXTROSE 2-4 GM/100ML-% IV SOLN
INTRAVENOUS | Status: AC
  Filled 2019-04-17: qty 100

## 2019-04-17 MED ORDER — SODIUM CHLORIDE 0.9 % IV SOLN
INTRAVENOUS | Status: DC
  Administered 2019-04-17: 10:00:00 via INTRAVENOUS

## 2019-04-17 NOTE — H&P (Addendum)
   History and Physical Update  The patient was interviewed and re-examined.  The patient's previous History and Physical has been reviewed and is unchanged recent office visit. Plan for evaluation of left bka site and possible conversion to aka.   Brandon C. Donzetta Matters, MD [facility] [place] Office: [phone] Pager: [phone]  04/17/2019, 10:05 AM  Addendum: Patient potassium elevated.  She will discharge home to dialyze today.  We have rescheduled for next week.  Servando Snare

## 2019-04-17 NOTE — Progress Notes (Addendum)
CRITICAL VALUE ALERT  Critical Value:  Potassium 6.6  Date & Time Notied:  04/17/19 1052  Provider Notified: Dr. Donzetta Matters  Orders Received/Actions taken: Contact Dr. Rodman Comp, Anesthesiologist.    1100-Per Dr. Ola Spurr, pt will be cancelled. Dr. Donzetta Matters to see patient to discuss further options. Pt will need to be dialyzed today at her normal dialysis center. Pt will be contacting transportation services.

## 2019-04-18 DIAGNOSIS — D509 Iron deficiency anemia, unspecified: Secondary | ICD-10-CM | POA: Diagnosis not present

## 2019-04-18 DIAGNOSIS — N186 End stage renal disease: Secondary | ICD-10-CM | POA: Diagnosis not present

## 2019-04-18 DIAGNOSIS — Z992 Dependence on renal dialysis: Secondary | ICD-10-CM | POA: Diagnosis not present

## 2019-04-19 DIAGNOSIS — D509 Iron deficiency anemia, unspecified: Secondary | ICD-10-CM | POA: Diagnosis not present

## 2019-04-19 DIAGNOSIS — N186 End stage renal disease: Secondary | ICD-10-CM | POA: Diagnosis not present

## 2019-04-19 DIAGNOSIS — Z992 Dependence on renal dialysis: Secondary | ICD-10-CM | POA: Diagnosis not present

## 2019-04-22 ENCOUNTER — Other Ambulatory Visit: Payer: Self-pay | Admitting: *Deleted

## 2019-04-22 DIAGNOSIS — N186 End stage renal disease: Secondary | ICD-10-CM | POA: Diagnosis not present

## 2019-04-22 DIAGNOSIS — D509 Iron deficiency anemia, unspecified: Secondary | ICD-10-CM | POA: Diagnosis not present

## 2019-04-22 DIAGNOSIS — Z992 Dependence on renal dialysis: Secondary | ICD-10-CM | POA: Diagnosis not present

## 2019-04-23 NOTE — Progress Notes (Signed)
Spoke to Angela Soto this morning to schedule Covid 19 screening prior to procedure on 04/25/19 at [facility]. She has transportation issues and will need to be tested DOS at [facility].

## 2019-04-24 ENCOUNTER — Encounter (HOSPITAL_COMMUNITY): Payer: Self-pay | Admitting: *Deleted

## 2019-04-24 ENCOUNTER — Other Ambulatory Visit: Payer: Self-pay

## 2019-04-24 DIAGNOSIS — Z992 Dependence on renal dialysis: Secondary | ICD-10-CM | POA: Diagnosis not present

## 2019-04-24 DIAGNOSIS — D509 Iron deficiency anemia, unspecified: Secondary | ICD-10-CM | POA: Diagnosis not present

## 2019-04-24 DIAGNOSIS — N186 End stage renal disease: Secondary | ICD-10-CM | POA: Diagnosis not present

## 2019-04-24 NOTE — Progress Notes (Addendum)
Pre-op instructions given to patient Patient is going to try to go to drive thru testing prior to arriving to hospital on the day of surgery  Kidney specialist - [facility]  Jeneen Rinks PA aware of need for anesthesia review Last dialysis on 04/24/19 Last dose of plavix on 04/12/19  Diabetes instructions given to patient Patient to take 1/2 dose of lantus the night before if blood sugar is > 150 according to her sliding scale. Patient stated that she hasnt had to use her insulin since she has been on dialysis She checks her sugar once daily and her blood sugar runs in the low 100s

## 2019-04-25 ENCOUNTER — Inpatient Hospital Stay (HOSPITAL_COMMUNITY): Payer: Medicare Other | Admitting: Physician Assistant

## 2019-04-25 ENCOUNTER — Inpatient Hospital Stay (HOSPITAL_COMMUNITY)
Admission: RE | Admit: 2019-04-25 | Discharge: 2019-04-26 | DRG: 500 | Disposition: A | Discharge status: Home / Self Care | Payer: Medicare Other | Attending: Vascular Surgery | Admitting: Vascular Surgery

## 2019-04-25 ENCOUNTER — Encounter (HOSPITAL_COMMUNITY): Payer: Self-pay

## 2019-04-25 ENCOUNTER — Other Ambulatory Visit (HOSPITAL_COMMUNITY): Payer: Self-pay | Admitting: *Deleted

## 2019-04-25 ENCOUNTER — Other Ambulatory Visit: Payer: Self-pay

## 2019-04-25 ENCOUNTER — Other Ambulatory Visit (HOSPITAL_COMMUNITY)
Admission: RE | Admit: 2019-04-25 | Discharge: 2019-04-25 | Disposition: A | Discharge status: Home / Self Care | Payer: Medicare Other | Source: Ambulatory Visit | Attending: Vascular Surgery | Admitting: Vascular Surgery

## 2019-04-25 ENCOUNTER — Encounter (HOSPITAL_COMMUNITY)
Admission: RE | Disposition: A | Discharge status: Home / Self Care | Payer: Self-pay | Source: Home / Self Care | Attending: Vascular Surgery

## 2019-04-25 DIAGNOSIS — Z89022 Acquired absence of left finger(s): Secondary | ICD-10-CM

## 2019-04-25 DIAGNOSIS — E039 Hypothyroidism, unspecified: Secondary | ICD-10-CM | POA: Diagnosis not present

## 2019-04-25 DIAGNOSIS — E1051 Type 1 diabetes mellitus with diabetic peripheral angiopathy without gangrene: Secondary | ICD-10-CM | POA: Diagnosis not present

## 2019-04-25 DIAGNOSIS — Z888 Allergy status to other drugs, medicaments and biological substances status: Secondary | ICD-10-CM | POA: Diagnosis not present

## 2019-04-25 DIAGNOSIS — E1122 Type 2 diabetes mellitus with diabetic chronic kidney disease: Secondary | ICD-10-CM | POA: Diagnosis not present

## 2019-04-25 DIAGNOSIS — Z833 Family history of diabetes mellitus: Secondary | ICD-10-CM

## 2019-04-25 DIAGNOSIS — D509 Iron deficiency anemia, unspecified: Secondary | ICD-10-CM | POA: Diagnosis not present

## 2019-04-25 DIAGNOSIS — G35 Multiple sclerosis: Secondary | ICD-10-CM | POA: Diagnosis not present

## 2019-04-25 DIAGNOSIS — I120 Hypertensive chronic kidney disease with stage 5 chronic kidney disease or end stage renal disease: Secondary | ICD-10-CM | POA: Diagnosis present

## 2019-04-25 DIAGNOSIS — I252 Old myocardial infarction: Secondary | ICD-10-CM

## 2019-04-25 DIAGNOSIS — Z955 Presence of coronary angioplasty implant and graft: Secondary | ICD-10-CM

## 2019-04-25 DIAGNOSIS — K219 Gastro-esophageal reflux disease without esophagitis: Secondary | ICD-10-CM | POA: Diagnosis not present

## 2019-04-25 DIAGNOSIS — F17210 Nicotine dependence, cigarettes, uncomplicated: Secondary | ICD-10-CM | POA: Diagnosis present

## 2019-04-25 DIAGNOSIS — Z79891 Long term (current) use of opiate analgesic: Secondary | ICD-10-CM

## 2019-04-25 DIAGNOSIS — I2510 Atherosclerotic heart disease of native coronary artery without angina pectoris: Secondary | ICD-10-CM | POA: Diagnosis not present

## 2019-04-25 DIAGNOSIS — Z79899 Other long term (current) drug therapy: Secondary | ICD-10-CM

## 2019-04-25 DIAGNOSIS — Z1159 Encounter for screening for other viral diseases: Secondary | ICD-10-CM | POA: Diagnosis not present

## 2019-04-25 DIAGNOSIS — K7581 Nonalcoholic steatohepatitis (NASH): Secondary | ICD-10-CM | POA: Diagnosis present

## 2019-04-25 DIAGNOSIS — Z992 Dependence on renal dialysis: Secondary | ICD-10-CM

## 2019-04-25 DIAGNOSIS — N185 Chronic kidney disease, stage 5: Secondary | ICD-10-CM | POA: Diagnosis not present

## 2019-04-25 DIAGNOSIS — E1022 Type 1 diabetes mellitus with diabetic chronic kidney disease: Secondary | ICD-10-CM | POA: Diagnosis present

## 2019-04-25 DIAGNOSIS — T8754 Necrosis of amputation stump, left lower extremity: Principal | ICD-10-CM | POA: Diagnosis present

## 2019-04-25 DIAGNOSIS — Z89512 Acquired absence of left leg below knee: Secondary | ICD-10-CM

## 2019-04-25 DIAGNOSIS — T8189XA Other complications of procedures, not elsewhere classified, initial encounter: Secondary | ICD-10-CM | POA: Diagnosis present

## 2019-04-25 DIAGNOSIS — Y838 Other surgical procedures as the cause of abnormal reaction of the patient, or of later complication, without mention of misadventure at the time of the procedure: Secondary | ICD-10-CM | POA: Diagnosis present

## 2019-04-25 DIAGNOSIS — Z7902 Long term (current) use of antithrombotics/antiplatelets: Secondary | ICD-10-CM

## 2019-04-25 DIAGNOSIS — Z7982 Long term (current) use of aspirin: Secondary | ICD-10-CM | POA: Diagnosis not present

## 2019-04-25 DIAGNOSIS — E7800 Pure hypercholesterolemia, unspecified: Secondary | ICD-10-CM | POA: Diagnosis present

## 2019-04-25 DIAGNOSIS — Z89511 Acquired absence of right leg below knee: Secondary | ICD-10-CM | POA: Diagnosis not present

## 2019-04-25 DIAGNOSIS — Z8041 Family history of malignant neoplasm of ovary: Secondary | ICD-10-CM | POA: Diagnosis not present

## 2019-04-25 DIAGNOSIS — Z8249 Family history of ischemic heart disease and other diseases of the circulatory system: Secondary | ICD-10-CM

## 2019-04-25 DIAGNOSIS — Z85850 Personal history of malignant neoplasm of thyroid: Secondary | ICD-10-CM

## 2019-04-25 DIAGNOSIS — N186 End stage renal disease: Secondary | ICD-10-CM | POA: Diagnosis not present

## 2019-04-25 DIAGNOSIS — T8130XA Disruption of wound, unspecified, initial encounter: Secondary | ICD-10-CM | POA: Diagnosis not present

## 2019-04-25 DIAGNOSIS — Z794 Long term (current) use of insulin: Secondary | ICD-10-CM

## 2019-04-25 DIAGNOSIS — Z79890 Hormone replacement therapy (postmenopausal): Secondary | ICD-10-CM

## 2019-04-25 HISTORY — PX: AMPUTATION: SHX166

## 2019-04-25 LAB — CBC
HCT: 43.2 % (ref 36.0–46.0)
Hemoglobin: 15.4 g/dL — ABNORMAL HIGH (ref 12.0–15.0)
MCH: 31.4 pg (ref 26.0–34.0)
MCHC: 35.6 g/dL (ref 30.0–36.0)
MCV: 88 fL (ref 80.0–100.0)
Platelets: 200 10*3/uL (ref 150–400)
RBC: 4.91 MIL/uL (ref 3.87–5.11)
RDW: 15.5 % (ref 11.5–15.5)
WBC: 12.2 10*3/uL — ABNORMAL HIGH (ref 4.0–10.5)
nRBC: 0 % (ref 0.0–0.2)

## 2019-04-25 LAB — COMPREHENSIVE METABOLIC PANEL
ALT: 19 U/L (ref 0–44)
AST: 24 U/L (ref 15–41)
Albumin: 3.1 g/dL — ABNORMAL LOW (ref 3.5–5.0)
Alkaline Phosphatase: 102 U/L (ref 38–126)
Anion gap: 16 — ABNORMAL HIGH (ref 5–15)
BUN: 54 mg/dL — ABNORMAL HIGH (ref 6–20)
CO2: 24 mmol/L (ref 22–32)
Calcium: 8.7 mg/dL — ABNORMAL LOW (ref 8.9–10.3)
Chloride: 91 mmol/L — ABNORMAL LOW (ref 98–111)
Creatinine, Ser: 6.33 mg/dL — ABNORMAL HIGH (ref 0.44–1.00)
GFR calc Af Amer: 8 mL/min — ABNORMAL LOW (ref >60)
GFR calc non Af Amer: 7 mL/min — ABNORMAL LOW (ref >60)
Glucose, Bld: 123 mg/dL — ABNORMAL HIGH (ref 70–99)
Potassium: 4.8 mmol/L (ref 3.5–5.1)
Sodium: 131 mmol/L — ABNORMAL LOW (ref 135–145)
Total Bilirubin: 1 mg/dL (ref 0.3–1.2)
Total Protein: 7.3 g/dL (ref 6.5–8.1)

## 2019-04-25 LAB — GLUCOSE, CAPILLARY
Glucose-Capillary: 135 mg/dL — ABNORMAL HIGH (ref 70–99)
Glucose-Capillary: 103 mg/dL — ABNORMAL HIGH (ref 70–99)
Glucose-Capillary: 88 mg/dL (ref 70–99)
Glucose-Capillary: 306 mg/dL — ABNORMAL HIGH (ref 70–99)

## 2019-04-25 LAB — SARS CORONAVIRUS 2 BY RT PCR (HOSPITAL ORDER, PERFORMED IN ~~LOC~~ HOSPITAL LAB): SARS Coronavirus 2: NEGATIVE

## 2019-04-25 SURGERY — AMPUTATION, ABOVE KNEE
Anesthesia: General | Laterality: Left

## 2019-04-25 MED ORDER — MIDAZOLAM HCL 5 MG/5ML IJ SOLN
INTRAMUSCULAR | Status: DC | PRN
  Administered 2019-04-25 (×2): 1 mg via INTRAVENOUS

## 2019-04-25 MED ORDER — HEPARIN SODIUM (PORCINE) 5000 UNIT/ML IJ SOLN
5000.0000 [IU] | Freq: Three times a day (TID) | INTRAMUSCULAR | Status: DC
  Administered 2019-04-26: 5000 [IU] via SUBCUTANEOUS
  Filled 2019-04-25: qty 1

## 2019-04-25 MED ORDER — ONDANSETRON HCL 4 MG/2ML IJ SOLN: 4.0000 mg | Freq: Once | INTRAMUSCULAR | Status: DC | PRN

## 2019-04-25 MED ORDER — MORPHINE SULFATE (PF) 2 MG/ML IV SOLN: 2.0000 mg | INTRAVENOUS | Status: DC | PRN

## 2019-04-25 MED ORDER — MIDAZOLAM HCL 2 MG/2ML IJ SOLN
INTRAMUSCULAR | Status: AC
  Filled 2019-04-25: qty 2

## 2019-04-25 MED ORDER — INSULIN ASPART 100 UNIT/ML ~~LOC~~ SOLN
0.0000 [IU] | Freq: Three times a day (TID) | SUBCUTANEOUS | Status: DC
  Administered 2019-04-26: 5 [IU] via SUBCUTANEOUS

## 2019-04-25 MED ORDER — PANTOPRAZOLE SODIUM 40 MG PO TBEC
40.0000 mg | DELAYED_RELEASE_TABLET | Freq: Every day | ORAL | Status: DC
  Administered 2019-04-25: 40 mg via ORAL
  Filled 2019-04-25: qty 1

## 2019-04-25 MED ORDER — GABAPENTIN 100 MG PO CAPS: 100.0000 mg | ORAL_CAPSULE | Freq: Three times a day (TID) | ORAL | Status: DC | PRN

## 2019-04-25 MED ORDER — DEXAMETHASONE SODIUM PHOSPHATE 10 MG/ML IJ SOLN
INTRAMUSCULAR | Status: DC | PRN
  Administered 2019-04-25: 10 mg via INTRAVENOUS

## 2019-04-25 MED ORDER — ASPIRIN EC 81 MG PO TBEC: 81.0000 mg | DELAYED_RELEASE_TABLET | Freq: Two times a day (BID) | ORAL | Status: DC

## 2019-04-25 MED ORDER — CEFAZOLIN SODIUM-DEXTROSE 1-4 GM/50ML-% IV SOLN
1.0000 g | Freq: Once | INTRAVENOUS | Status: DC
  Filled 2019-04-25: qty 50

## 2019-04-25 MED ORDER — ONDANSETRON HCL 4 MG/2ML IJ SOLN
INTRAMUSCULAR | Status: DC | PRN
  Administered 2019-04-25: 4 mg via INTRAVENOUS

## 2019-04-25 MED ORDER — LEVOTHYROXINE SODIUM 200 MCG PO TABS
200.0000 ug | ORAL_TABLET | Freq: Every day | ORAL | Status: DC
  Administered 2019-04-26: 200 ug via ORAL
  Filled 2019-04-25: qty 2
  Filled 2019-04-25: qty 1

## 2019-04-25 MED ORDER — ACETAMINOPHEN 325 MG PO TABS: 325.0000 mg | ORAL_TABLET | ORAL | Status: DC | PRN

## 2019-04-25 MED ORDER — DEXAMETHASONE SODIUM PHOSPHATE 10 MG/ML IJ SOLN
INTRAMUSCULAR | Status: AC
  Filled 2019-04-25: qty 1

## 2019-04-25 MED ORDER — CEFAZOLIN SODIUM-DEXTROSE 2-4 GM/100ML-% IV SOLN
2.0000 g | INTRAVENOUS | Status: AC
  Administered 2019-04-25: 2 g via INTRAVENOUS
  Filled 2019-04-25: qty 100

## 2019-04-25 MED ORDER — SODIUM CHLORIDE 0.9 % IV SOLN
INTRAVENOUS | Status: DC
  Administered 2019-04-25 (×2): via INTRAVENOUS

## 2019-04-25 MED ORDER — INSULIN GLARGINE 100 UNIT/ML ~~LOC~~ SOLN
31.0000 [IU] | Freq: Every evening | SUBCUTANEOUS | Status: DC | PRN
  Administered 2019-04-26: 31 [IU] via SUBCUTANEOUS
  Filled 2019-04-25 (×3): qty 0.31

## 2019-04-25 MED ORDER — LIDOCAINE 2% (20 MG/ML) 5 ML SYRINGE
INTRAMUSCULAR | Status: DC | PRN
  Administered 2019-04-25: 100 mg via INTRAVENOUS

## 2019-04-25 MED ORDER — ONDANSETRON HCL 4 MG/2ML IJ SOLN
4.0000 mg | Freq: Four times a day (QID) | INTRAMUSCULAR | Status: DC | PRN
  Administered 2019-04-26: 4 mg via INTRAVENOUS
  Filled 2019-04-25: qty 2

## 2019-04-25 MED ORDER — LIDOCAINE 2% (20 MG/ML) 5 ML SYRINGE
INTRAMUSCULAR | Status: AC
  Filled 2019-04-25: qty 5

## 2019-04-25 MED ORDER — FENTANYL CITRATE (PF) 100 MCG/2ML IJ SOLN: 25.0000 ug | INTRAMUSCULAR | Status: DC | PRN

## 2019-04-25 MED ORDER — FUROSEMIDE 80 MG PO TABS
80.0000 mg | ORAL_TABLET | Freq: Two times a day (BID) | ORAL | Status: DC
  Administered 2019-04-25 – 2019-04-26 (×2): 80 mg via ORAL
  Filled 2019-04-25 (×2): qty 1

## 2019-04-25 MED ORDER — METOPROLOL TARTRATE 5 MG/5ML IV SOLN: 2.0000 mg | INTRAVENOUS | Status: DC | PRN

## 2019-04-25 MED ORDER — 0.9 % SODIUM CHLORIDE (POUR BTL) OPTIME
TOPICAL | Status: DC | PRN
  Administered 2019-04-25: 16:00:00 1000 mL
  Administered 2019-04-25: 16:00:00 2000 mL

## 2019-04-25 MED ORDER — LABETALOL HCL 5 MG/ML IV SOLN: 10.0000 mg | INTRAVENOUS | Status: DC | PRN

## 2019-04-25 MED ORDER — INSULIN GLARGINE 100 UNIT/ML ~~LOC~~ SOLN: 31.0000 [IU] | Freq: Every evening | SUBCUTANEOUS | Status: DC | PRN

## 2019-04-25 MED ORDER — TOPIRAMATE 25 MG PO TABS: 50.0000 mg | ORAL_TABLET | Freq: Every day | ORAL | Status: DC | PRN

## 2019-04-25 MED ORDER — ONDANSETRON HCL 4 MG/2ML IJ SOLN
INTRAMUSCULAR | Status: AC
  Filled 2019-04-25: qty 2

## 2019-04-25 MED ORDER — PROPOFOL 10 MG/ML IV BOLUS
INTRAVENOUS | Status: AC
  Filled 2019-04-25: qty 20

## 2019-04-25 MED ORDER — FENTANYL CITRATE (PF) 250 MCG/5ML IJ SOLN
INTRAMUSCULAR | Status: AC
  Filled 2019-04-25: qty 5

## 2019-04-25 MED ORDER — PROPOFOL 10 MG/ML IV BOLUS
INTRAVENOUS | Status: DC | PRN
  Administered 2019-04-25: 80 mg via INTRAVENOUS

## 2019-04-25 MED ORDER — OXYCODONE HCL 5 MG PO TABS
10.0000 mg | ORAL_TABLET | Freq: Four times a day (QID) | ORAL | Status: DC | PRN
  Administered 2019-04-26 (×2): 10 mg via ORAL
  Filled 2019-04-25 (×2): qty 2

## 2019-04-25 MED ORDER — METOPROLOL SUCCINATE ER 100 MG PO TB24
100.0000 mg | ORAL_TABLET | Freq: Two times a day (BID) | ORAL | Status: DC
  Administered 2019-04-25: 100 mg via ORAL
  Filled 2019-04-25: qty 1

## 2019-04-25 MED ORDER — ACETAMINOPHEN 325 MG RE SUPP: 325.0000 mg | RECTAL | Status: DC | PRN

## 2019-04-25 MED ORDER — FENTANYL CITRATE (PF) 100 MCG/2ML IJ SOLN
INTRAMUSCULAR | Status: DC | PRN
  Administered 2019-04-25 (×5): 50 ug via INTRAVENOUS

## 2019-04-25 MED ORDER — CHLORHEXIDINE GLUCONATE 4 % EX LIQD: 60.0000 mL | Freq: Once | CUTANEOUS | Status: DC

## 2019-04-25 MED ORDER — ISOSORBIDE MONONITRATE ER 60 MG PO TB24
90.0000 mg | ORAL_TABLET | Freq: Every evening | ORAL | Status: DC
  Administered 2019-04-25: 90 mg via ORAL
  Filled 2019-04-25: qty 1

## 2019-04-25 MED ORDER — PROTAMINE SULFATE 10 MG/ML IV SOLN
INTRAVENOUS | Status: AC
  Filled 2019-04-25: qty 5

## 2019-04-25 MED ORDER — DEXMEDETOMIDINE HCL IN NACL 200 MCG/50ML IV SOLN
INTRAVENOUS | Status: AC
  Filled 2019-04-25: qty 50

## 2019-04-25 MED ORDER — HYDRALAZINE HCL 20 MG/ML IJ SOLN: 5.0000 mg | INTRAMUSCULAR | Status: DC | PRN

## 2019-04-25 MED ORDER — HEPARIN SODIUM (PORCINE) 1000 UNIT/ML IJ SOLN
INTRAMUSCULAR | Status: AC
  Filled 2019-04-25: qty 2

## 2019-04-25 MED ORDER — ROSUVASTATIN CALCIUM 5 MG PO TABS
10.0000 mg | ORAL_TABLET | Freq: Every evening | ORAL | Status: DC
  Administered 2019-04-25: 10 mg via ORAL
  Filled 2019-04-25: qty 2

## 2019-04-25 MED ORDER — DOCUSATE SODIUM 100 MG PO CAPS: 100.0000 mg | ORAL_CAPSULE | Freq: Every day | ORAL | Status: DC

## 2019-04-25 MED ORDER — ACETAMINOPHEN 500 MG PO TABS
1000.0000 mg | ORAL_TABLET | Freq: Once | ORAL | Status: AC
  Administered 2019-04-25: 1000 mg via ORAL
  Filled 2019-04-25: qty 2

## 2019-04-25 SURGICAL SUPPLY — 58 items
BANDAGE ACE 4X5 VEL STRL LF (GAUZE/BANDAGES/DRESSINGS) ×1 IMPLANT
BANDAGE ACE 6X5 VEL STRL LF (GAUZE/BANDAGES/DRESSINGS) ×1 IMPLANT
BANDAGE ELASTIC 4 VELCRO ST LF (GAUZE/BANDAGES/DRESSINGS) ×2 IMPLANT
BANDAGE ESMARK 6X9 LF (GAUZE/BANDAGES/DRESSINGS) ×1 IMPLANT
BLADE SAGITTAL (BLADE)
BLADE SAGITTAL 25.0X1.19X90 (BLADE) ×1 IMPLANT
BLADE SAGITTAL 25.0X1.19X90MM (BLADE) ×1
BLADE SAW GIGLI 510 (BLADE) ×2 IMPLANT
BLADE SAW GIGLI 510MM (BLADE) ×1
BLADE SAW THK.89X75X18XSGTL (BLADE) IMPLANT
BNDG COHESIVE 6X5 TAN STRL LF (GAUZE/BANDAGES/DRESSINGS) ×1 IMPLANT
BNDG ESMARK 6X9 LF (GAUZE/BANDAGES/DRESSINGS) ×3
BNDG GAUZE ELAST 4 BULKY (GAUZE/BANDAGES/DRESSINGS) ×5 IMPLANT
CANISTER SUCT 3000ML PPV (MISCELLANEOUS) ×3 IMPLANT
CLIP VESOCCLUDE MED 6/CT (CLIP) ×3 IMPLANT
COVER SURGICAL LIGHT HANDLE (MISCELLANEOUS) ×3 IMPLANT
COVER WAND RF STERILE (DRAPES) ×1 IMPLANT
CUFF TOURN SGL QUICK 24 (TOURNIQUET CUFF)
CUFF TOURN SGL QUICK 34 (TOURNIQUET CUFF)
CUFF TRNQT CYL 24X4X16.5-23 (TOURNIQUET CUFF) IMPLANT
CUFF TRNQT CYL 34X4.125X (TOURNIQUET CUFF) IMPLANT
DRAIN CHANNEL 19F RND (DRAIN) IMPLANT
DRAPE HALF SHEET 40X57 (DRAPES) ×3 IMPLANT
DRAPE ORTHO SPLIT 77X108 STRL (DRAPES) ×4
DRAPE SURG ORHT 6 SPLT 77X108 (DRAPES) ×2 IMPLANT
DRSG ADAPTIC 3X8 NADH LF (GAUZE/BANDAGES/DRESSINGS) ×3 IMPLANT
ELECT CAUTERY BLADE 6.4 (BLADE) ×3 IMPLANT
ELECT REM PT RETURN 9FT ADLT (ELECTROSURGICAL) ×3
ELECTRODE REM PT RTRN 9FT ADLT (ELECTROSURGICAL) ×1 IMPLANT
EVACUATOR SILICONE 100CC (DRAIN) IMPLANT
GAUZE SPONGE 4X4 12PLY STRL (GAUZE/BANDAGES/DRESSINGS) ×1 IMPLANT
GLOVE BIO SURGEON STRL SZ7.5 (GLOVE) ×5 IMPLANT
GLOVE BIOGEL PI IND STRL 6.5 (GLOVE) IMPLANT
GLOVE BIOGEL PI IND STRL 8 (GLOVE) IMPLANT
GLOVE BIOGEL PI INDICATOR 6.5 (GLOVE) ×2
GLOVE BIOGEL PI INDICATOR 8 (GLOVE) ×2
GOWN STRL REUS W/ TWL LRG LVL3 (GOWN DISPOSABLE) ×2 IMPLANT
GOWN STRL REUS W/ TWL XL LVL3 (GOWN DISPOSABLE) ×1 IMPLANT
GOWN STRL REUS W/TWL LRG LVL3 (GOWN DISPOSABLE)
GOWN STRL REUS W/TWL XL LVL3 (GOWN DISPOSABLE) ×6
KIT BASIN OR (CUSTOM PROCEDURE TRAY) ×3 IMPLANT
KIT TURNOVER KIT B (KITS) ×3 IMPLANT
NS IRRIG 1000ML POUR BTL (IV SOLUTION) ×3 IMPLANT
PACK GENERAL/GYN (CUSTOM PROCEDURE TRAY) ×3 IMPLANT
PAD ABD 8X10 STRL (GAUZE/BANDAGES/DRESSINGS) ×4 IMPLANT
PAD ARMBOARD 7.5X6 YLW CONV (MISCELLANEOUS) ×6 IMPLANT
STAPLER VISISTAT 35W (STAPLE) ×3 IMPLANT
STOCKINETTE IMPERVIOUS LG (DRAPES) ×3 IMPLANT
SUT ETHILON 3 0 PS 1 (SUTURE) IMPLANT
SUT SILK 0 TIES 10X30 (SUTURE) ×3 IMPLANT
SUT SILK 2 0 (SUTURE) ×2
SUT SILK 2-0 18XBRD TIE 12 (SUTURE) ×1 IMPLANT
SUT SILK 3 0 (SUTURE) ×2
SUT SILK 3-0 18XBRD TIE 12 (SUTURE) IMPLANT
SUT VIC AB 2-0 CT1 18 (SUTURE) ×6 IMPLANT
TOWEL GREEN STERILE (TOWEL DISPOSABLE) ×6 IMPLANT
UNDERPAD 30X30 (UNDERPADS AND DIAPERS) ×3 IMPLANT
WATER STERILE IRR 1000ML POUR (IV SOLUTION) ×3 IMPLANT

## 2019-04-25 NOTE — Progress Notes (Signed)
Pt arrived from PACU. Pt placed on wall monitor/ccmd notified. CHG bath given and gown changed. Pt has call bell within reach and has been oriented to room. Vitals stable. Will continue to monitor. Jerald Kief, RN

## 2019-04-25 NOTE — H&P (Signed)
   History and Physical Update  The patient was interviewed and re-examined.  The patient's previous History and Physical has been reviewed and is unchanged from recent office visit. Plan for debridement of left bka vs conversion to aka.   Breckon Reeves C. Donzetta Matters, MD [facility] [place] Office: [phone] Pager: [phone]  04/25/2019, 5:30 PM

## 2019-04-25 NOTE — Anesthesia Postprocedure Evaluation (Signed)
Anesthesia Post Note  Patient: Angela Soto  Procedure(s) Performed: DEBRIDEMENT BELOW KNEE AMPUTATION (Left )     Patient location during evaluation: PACU Anesthesia Type: General Level of consciousness: awake and alert Pain management: pain level controlled Vital Signs Assessment: post-procedure vital signs reviewed and stable Respiratory status: spontaneous breathing, nonlabored ventilation and respiratory function stable Cardiovascular status: blood pressure returned to baseline and stable Postop Assessment: no apparent nausea or vomiting Anesthetic complications: no    Last Vitals:  Vitals:   04/25/19 1635 04/25/19 1650  BP: (!) 112/59 131/71  Pulse: 61 61  Resp: (!) 9 (!) 9  Temp:    SpO2: 93% 92%    Last Pain:  Vitals:   04/25/19 1650  TempSrc:   PainSc: 0-No pain                 Zhavia Cunanan,W. EDMOND

## 2019-04-25 NOTE — Transfer of Care (Signed)
Immediate Anesthesia Transfer of Care Note  Patient: Angela Soto  Procedure(s) Performed: DEBRIDEMENT BELOW KNEE AMPUTATION (Left )  Patient Location: PACU  Anesthesia Type:General  Level of Consciousness: drowsy and patient cooperative  Airway & Oxygen Therapy: Patient Spontanous Breathing  Post-op Assessment: Report given to RN and Post -op Vital signs reviewed and stable  Post vital signs: Reviewed and stable  Last Vitals:  Vitals Value Taken Time  BP 124/59 04/25/19 1622  Temp    Pulse 61 04/25/19 1624  Resp 8 04/25/19 1624  SpO2 95 % 04/25/19 1624  Vitals shown include unvalidated device data.  Last Pain:  Vitals:   04/25/19 1150  TempSrc:   PainSc: 8       Patients Stated Pain Goal: 3 (76/22/63 3354)  Complications: No apparent anesthesia complications

## 2019-04-25 NOTE — Progress Notes (Signed)
PHARMACY NOTE:  ANTIMICROBIAL RENAL DOSAGE ADJUSTMENT  Current antimicrobial regimen includes a mismatch between antimicrobial dosage and estimated renal function.  As per policy approved by the Pharmacy & Therapeutics and Medical Executive Committees, the antimicrobial dosage will be adjusted accordingly.  Current antimicrobial dosage:  Ancef 2gm IV Q8H x2 doses (received pre-op dose at 1540)  Indication: surgical prophylaxis  Renal Function:  Estimated Creatinine Clearance: 8.9 mL/min (A) (by C-G formula based on SCr of 6.33 mg/dL (H)). []      On intermittent HD, scheduled: []      On CRRT    Antimicrobial dosage has been changed to:  Ancef 1gm IV x 1 tomorrow  Additional comments:  Last HD 04/24/19, unsure of next session  Mohammad Granade D. Mina Marble, PharmD, BCPS, [place] 04/25/2019, 5:27 PM

## 2019-04-25 NOTE — Op Note (Signed)
    Patient name: Angela Soto MRN: [id number] DOB: 06-08-1971 Sex: female  04/25/2019 Pre-operative Diagnosis: esrd, necrotic wound left bka stump Post-operative diagnosis:  Same Surgeon:  Eda Paschal. Donzetta Matters, MD. Assistant: Arlee Muslim, [place] Procedure Performed: Hervey Ard excisional debridement of left bka site skin, subcutaneous tissue and muscle to 11 x 9cm  Indications: 48 year old female with history of bilateral amputations.  She has a left below-knee amputation with a large necrotic ulcer is indicated for debridement versus conversion to above-knee amputation.  Findings: Large necrotic ulcer was debrided back to healthy-appearing muscle and subcutaneous tissue wet-to-dry dressing was placed.   Procedure:  The patient was identified in the holding area and taken to the operating room where is put supine operative when general anesthesia induced.  She was then prepped and draped the left lower extremity usual fashion antibiotics were administered timeout called.  We used 10 blade and electrocautery to debride back to healthy-appearing tissue.  There was no exposed bone.  Muscle was reactive to cautery.  Hemostasis was obtained.  We thoroughly irrigated the wound.  Total size was 9 x 11 cm.  A wet-to-dry dressing was placed in Ace wrap over that.  She tolerated procedure without immediate complication.  EBL: 20cc   Mays Paino C. Donzetta Matters, MD [facility] [place] Office: [phone] Pager: [phone]

## 2019-04-25 NOTE — Consult Note (Signed)
[facility] Renal Consultation Note  Requesting MD: Donzetta Matters  Indication for Consultation:  ESRD  Chief complaint: left leg wound  HPI:  Angela Soto is a 48 y.o. female with a history of ESRD on hemodialysis Monday Wednesday Friday at [facility], CAD, and PAD who presented to the hospital for debridement of her left BKA today.  She has had a necrotic wound of her left BKA residual limb.  She had some left leg swelling earlier but that is gone down postoperatively.  The patient states that she dialyzes at the [facility] unit on [facility].  She follows with Dr. Lowanda Foster.  She has a left upper extremity AV fistula and normally tolerates dialysis well.  Per nursing, her primary team states that she would be able to go home tomorrow and dialyze at her outpatient unit if the unit is able to accommodate her.  She does think that she would be able to work this out as they have told her they can give her a later chair time; normally 1st shift.  She would prefer to dialyze at her outpatient unit as they know her there.   PMHx:   Past Medical History:  Diagnosis Date  . Coronary artery disease    a. s/p prior PCI in 1998 b. 10/2017: cath showing severe two-vessel CAD with heavy calcification along the LCx and 100% CTO of RCA with left to right collaterals noted. No good options for PCI. Medical management recommended.   . Environmental allergies    uses inhalers  . ESRD (end stage renal disease) on dialysis [facility])    "started dialysis on 10/08/13; MWF; [facility]; [place], [place]"  . GERD (gastroesophageal reflux disease)   . TKZSWFUX(323.5)    "weekly" (11/09/2017)  . High cholesterol   . Hypertension   . Hypothyroidism   . Multiple sclerosis ([place])    just diagnosed 2014  . Myocardial infarction ([place]) 1998  . Nonalcoholic steatohepatitis ([facility])   . Peripheral vascular disease ([place])    aortic artery occlusion  . PONV (postoperative nausea and vomiting)   . Thyroid cancer ([place])   . Type 1  diabetes mellitus ([place])   . Umbilical hernia     Past Surgical History:  Procedure Laterality Date  . ABDOMINAL SURGERY  2006   aortic artery occluded, had to "clean it out"  . AMPUTATION Left 09/22/2014   Procedure: AMPUTATION LEFT FOURTH FINGER;  Surgeon: Dayna Barker, MD;  Location: [place];  Service: Plastics;  Laterality: Left;  . AV FISTULA PLACEMENT Left 09/24/2013   Procedure: LEFT UPPER EXTREMITY ARTERIOVENOUS (AV) FISTULA CREATION BRACHIAL/CEPHALIC;  Surgeon: Elam Dutch, MD;  Location: [place];  Service: Vascular;  Laterality: Left;  . BASCILIC VEIN TRANSPOSITION Left 02/18/2014   Procedure: BASILIC VEIN TRANSPOSITION - 2ND STAGE LEFT ARM;  Surgeon: Elam Dutch, MD;  Location: [place];  Service: Vascular;  Laterality: Left;  . CARDIAC CATHETERIZATION     "numerous"  . CARDIAC CATHETERIZATION  11/09/2017  . CORONARY ANGIOPLASTY    . CORONARY ANGIOPLASTY WITH STENT PLACEMENT     "think I have a total of 3 stents" (11/09/2017)  . INSERTION OF DIALYSIS CATHETER Right 10/07/2013   Procedure: INSERTION OF DIALYSIS CATHETER;  Surgeon: Conrad Elma, MD;  Location: [place];  Service: Vascular;  Laterality: Right;  . IRRIGATION AND DEBRIDEMENT ABSCESS Right   . LEFT HEART CATH AND CORONARY ANGIOGRAPHY N/A 11/09/2017   Procedure: LEFT HEART CATH AND CORONARY ANGIOGRAPHY;  Surgeon: Burnell Blanks,  MD;  Location: [place] CV [facility];  Service: Cardiovascular;  Laterality: N/A;  . LEG AMPUTATION BELOW KNEE Bilateral 2008-2010   left-right  . PARATHYROIDECTOMY    . SHUNTOGRAM Left 04/17/2014   Procedure: FISTULOGRAM;  Surgeon: Conrad Kwethluk, MD;  Location: [facility] CATH LAB;  Service: Cardiovascular;  Laterality: Left;  . THYROIDECTOMY      Family Hx:  Family History  Problem Relation Age of Onset  . Diabetes Mellitus II Mother   . Ovarian cancer Mother   . CAD Father 27  . Diabetes Mellitus II Father     Social History:  reports that she has been smoking cigarettes. She has a 32.00  pack-year smoking history. She has never used smokeless tobacco. She reports that she does not drink alcohol or use drugs.  Allergies:  Allergies  Allergen Reactions  . Metformin And Related Diarrhea and Nausea And Vomiting  . Chantix [Varenicline]     "EVIL DREAMS"  . Ibuprofen Other (See Comments)    Cannot take because of low kidney function    Medications: Prior to Admission medications   Medication Sig Start Date End Date Taking? Authorizing Provider  aspirin EC 81 MG tablet Take 1 tablet (81 mg total) by mouth daily. Patient taking differently: Take 81 mg by mouth 2 (two) times a day.  11/08/16  Yes Herminio Commons, MD  calcium acetate (PHOSLO) 667 MG capsule Take 1,334-2,668 mg by mouth See admin instructions. Take 4 capsules (2668 mg) by mouth with meals and take 2 capsules (1334 mg) by mouth with snacks.   Yes [provider]  famotidine (PEPCID) 40 MG tablet Take 40 mg by mouth every evening.   Yes [provider]  folic acid-vitamin b complex-vitamin c-selenium-zinc (DIALYVITE) 3 MG TABS tablet Take 1 tablet by mouth daily.   Yes [provider]  furosemide (LASIX) 80 MG tablet Take 80 mg by mouth 2 (two) times a day.   Yes [provider]  gabapentin (NEURONTIN) 100 MG capsule Take 100-200 mg by mouth 3 (three) times daily as needed (pain. (scheduled at night before bedtime)).   Yes [provider]  insulin lispro (HUMALOG) 100 UNIT/ML injection Inject 12 Units into the skin 3 (three) times daily as needed for high blood sugar (blood sugars greater than 130).    Yes [provider]  isosorbide mononitrate (IMDUR) 60 MG 24 hr tablet Take 60 mg in the am, and 30 mg ( 1/2 tablet) in the pm Patient taking differently: Take 90 mg by mouth every evening.  01/04/19  Yes Strader, [place] M, PA-C  levothyroxine (SYNTHROID) 200 MCG tablet Take 200 mcg by mouth daily before breakfast.   Yes [provider]   lidocaine-prilocaine (EMLA) cream Apply 1 application topically as needed (for pain/ apply prior to dialysis).   Yes [provider]  metoprolol succinate (TOPROL-XL) 100 MG 24 hr tablet Take 100 mg by mouth 2 (two) times a day.    Yes [provider]  Omega-3 Fatty Acids (FISH OIL) 1000 MG CAPS Take 1,000 mg by mouth 2 (two) times a day.    Yes [provider]  Oxycodone HCl 10 MG TABS Take 10 mg by mouth every 6 (six) hours as needed (for pain).    Yes [provider]  rosuvastatin (CRESTOR) 10 MG tablet TAKE 1 TABLET BY MOUTH DAILY Patient taking differently: Take 10 mg by mouth every evening.  01/03/19  Yes Strader, [place] M, PA-C  topiramate (TOPAMAX) 50  MG tablet Take 50 mg by mouth daily as needed (headaches.).   Yes [provider]  clopidogrel (PLAVIX) 75 MG tablet Take 75 mg by mouth every evening.     [provider]  insulin glargine (LANTUS) 100 UNIT/ML injection Inject 31-62 Units into the skin at bedtime as needed (for blood sugars greater than 150).     [provider]  nicotine polacrilex (COMMIT) 4 MG lozenge Take 1 lozenge (4 mg total) by mouth as needed for smoking cessation. Patient not taking: Reported on 04/16/2019 10/02/18   Erma Heritage, PA-C    I have reviewed the patient's current medications.  Labs:  BMP Latest Ref Rng & Units 04/25/2019 04/17/2019 11/10/2017  Glucose 70 - 99 mg/dL 123(H) 88 100(H)  BUN 6 - 20 mg/dL 54(H) 64(H) 30(H)  Creatinine 0.44 - 1.00 mg/dL 6.33(H) 8.86(H) 3.74(H)  Sodium 135 - 145 mmol/L 131(L) 135 136  Potassium 3.5 - 5.1 mmol/L 4.8 6.6(HH) 3.4(L)  Chloride 98 - 111 mmol/L 91(L) 94(L) 98(L)  CO2 22 - 32 mmol/L 24 25 25   Calcium 8.9 - 10.3 mg/dL 8.7(L) 8.3(L) 7.7(L)    Urinalysis    Component Value Date/Time   COLORURINE YELLOW 07/29/2013 2331   APPEARANCEUR CLOUDY (A) 07/29/2013 2331   LABSPEC 1.011 07/29/2013 2331   PHURINE 5.5 07/29/2013 2331   GLUCOSEU 250 (A)  07/29/2013 2331   HGBUR TRACE (A) 07/29/2013 2331   BILIRUBINUR NEGATIVE 07/29/2013 2331   KETONESUR NEGATIVE 07/29/2013 2331   PROTEINUR 100 (A) 07/29/2013 2331   UROBILINOGEN 0.2 07/29/2013 2331   NITRITE NEGATIVE 07/29/2013 2331   LEUKOCYTESUR TRACE (A) 07/29/2013 2331     ROS:  Pertinent items noted in HPI and remainder of comprehensive ROS otherwise negative.  Physical Exam: Vitals:   04/25/19 1650 04/25/19 1730  BP: 131/71 (!) 163/68  Pulse: 61 65  Resp: (!) 9 16  Temp:  97.6 F (36.4 C)  SpO2: 92% 99%     General: Adult female in bed in no acute distress HENT: Normocephalic atraumatic Eyes: Extraocular movements intact sclera anicteric Neck: Supple no JVD Heart: S1-S2 no rub appreciated Lungs: Clear to auscultation normal work of breathing Abdomen: Soft nontender nondistended Extremities: No edema of residual right lower extremity and left leg is wrapped Skin:  left leg is wrapped Neuro: Alert and oriented x3 conversant and provides a history Access-left upper extremity AV fistula with bruit and thrill  Assessment/Plan:  # End-stage renal disease on hemodialysis - MWF schedule - From a renal standpoint, she is stable for dialysis at her outpatient unit in [facility] tomorrow.  She states that she is able to call them in the morning to get a spot - If this is not able to be arranged we are happy to dialyze her here - Would limit gabapentin to a maximum of 300 mg over the course of the day given her ESRD   # Necrotic wound left residual limb  - s/p debridement with vascular   # HTN with ESRD  - Would continue home regimen    # hx hyperkalemia - Note previous hyperkalemia - Acceptable per labs today  - BMP is ordered for AM  Claudia Desanctis 04/25/2019, 7:12 PM

## 2019-04-25 NOTE — Anesthesia Preprocedure Evaluation (Addendum)
Anesthesia Evaluation  Patient identified by MRN, date of birth, ID band Patient awake    Reviewed: Allergy & Precautions, NPO status , Patient's Chart, lab work & pertinent test results, reviewed documented beta blocker date and time   History of Anesthesia Complications (+) PONV and history of anesthetic complications  Airway Mallampati: II  TM Distance: >3 FB Neck ROM: Full    Dental  (+) Dental Advisory Given, Upper Dentures   Pulmonary Current Smoker,    Pulmonary exam normal breath sounds clear to auscultation       Cardiovascular hypertension, Pt. on home beta blockers and Pt. on medications + CAD, + Past MI, + Cardiac Stents and + Peripheral Vascular Disease  Normal cardiovascular exam Rhythm:Regular Rate:Normal     Neuro/Psych  Headaches, Multiple sclerosis    GI/Hepatic Neg liver ROS, GERD  ,  Endo/Other  diabetes, Type 2, Insulin Dependent, Oral Hypoglycemic AgentsHypothyroidism Morbid obesity  Renal/GU ESRF and DialysisRenal diseaseK+ 4.8     Musculoskeletal negative musculoskeletal ROS (+)   Abdominal   Peds  Hematology negative hematology ROS (+)   Anesthesia Other Findings Day of surgery medications reviewed with the patient.  Reproductive/Obstetrics                           Anesthesia Physical Anesthesia Plan  ASA: IV  Anesthesia Plan: General   Post-op Pain Management:    Induction: Intravenous  PONV Risk Score and Plan: 3 and Midazolam, Dexamethasone and Ondansetron  Airway Management Planned: LMA  Additional Equipment:   Intra-op Plan:   Post-operative Plan: Extubation in OR  Informed Consent: I have reviewed the patients History and Physical, chart, labs and discussed the procedure including the risks, benefits and alternatives for the proposed anesthesia with the patient or authorized representative who has indicated his/her understanding and acceptance.      Dental advisory given  Plan Discussed with: CRNA  Anesthesia Plan Comments:         Anesthesia Quick Evaluation

## 2019-04-25 NOTE — Anesthesia Procedure Notes (Signed)
Procedure Name: LMA Insertion Date/Time: 04/25/2019 3:36 PM Performed by: Lance Coon, CRNA Pre-anesthesia Checklist: Patient identified, Emergency Drugs available, Suction available, Patient being monitored and Timeout performed Patient Re-evaluated:Patient Re-evaluated prior to induction Oxygen Delivery Method: Circle system utilized Preoxygenation: Pre-oxygenation with 100% oxygen Induction Type: IV induction LMA: LMA inserted LMA Size: 4.0 Number of attempts: 1 Placement Confirmation: positive ETCO2 and breath sounds checked- equal and bilateral Tube secured with: Tape Dental Injury: Teeth and Oropharynx as per pre-operative assessment

## 2019-04-26 ENCOUNTER — Encounter (HOSPITAL_COMMUNITY): Payer: Self-pay | Admitting: Vascular Surgery

## 2019-04-26 DIAGNOSIS — N186 End stage renal disease: Secondary | ICD-10-CM | POA: Diagnosis not present

## 2019-04-26 DIAGNOSIS — Z992 Dependence on renal dialysis: Secondary | ICD-10-CM | POA: Diagnosis not present

## 2019-04-26 DIAGNOSIS — D509 Iron deficiency anemia, unspecified: Secondary | ICD-10-CM | POA: Diagnosis not present

## 2019-04-26 LAB — BASIC METABOLIC PANEL
Anion gap: 18 — ABNORMAL HIGH (ref 5–15)
BUN: 68 mg/dL — ABNORMAL HIGH (ref 6–20)
CO2: 24 mmol/L (ref 22–32)
Calcium: 8.1 mg/dL — ABNORMAL LOW (ref 8.9–10.3)
Chloride: 88 mmol/L — ABNORMAL LOW (ref 98–111)
Creatinine, Ser: 7.29 mg/dL — ABNORMAL HIGH (ref 0.44–1.00)
GFR calc Af Amer: 7 mL/min — ABNORMAL LOW (ref >60)
GFR calc non Af Amer: 6 mL/min — ABNORMAL LOW (ref >60)
Glucose, Bld: 278 mg/dL — ABNORMAL HIGH (ref 70–99)
Potassium: 5.5 mmol/L — ABNORMAL HIGH (ref 3.5–5.1)
Sodium: 130 mmol/L — ABNORMAL LOW (ref 135–145)

## 2019-04-26 LAB — CBC
HCT: 38 % (ref 36.0–46.0)
Hemoglobin: 14.1 g/dL (ref 12.0–15.0)
MCH: 32.3 pg (ref 26.0–34.0)
MCHC: 37.1 g/dL — ABNORMAL HIGH (ref 30.0–36.0)
MCV: 87 fL (ref 80.0–100.0)
Platelets: 198 10*3/uL (ref 150–400)
RBC: 4.37 MIL/uL (ref 3.87–5.11)
RDW: 14.4 % (ref 11.5–15.5)
WBC: 11.7 10*3/uL — ABNORMAL HIGH (ref 4.0–10.5)
nRBC: 0 % (ref 0.0–0.2)

## 2019-04-26 LAB — GLUCOSE, CAPILLARY
Glucose-Capillary: 248 mg/dL — ABNORMAL HIGH (ref 70–99)
Glucose-Capillary: 312 mg/dL — ABNORMAL HIGH (ref 70–99)

## 2019-04-26 MED ORDER — HYDROMORPHONE HCL 1 MG/ML IJ SOLN
1.0000 mg | Freq: Once | INTRAMUSCULAR | Status: AC
  Administered 2019-04-26: 1 mg via INTRAVENOUS
  Filled 2019-04-26: qty 1

## 2019-04-26 MED ORDER — OXYCODONE HCL 10 MG PO TABS: 10.0000 mg | ORAL_TABLET | Freq: Four times a day (QID) | ORAL | 0 refills | Status: DC | PRN

## 2019-04-26 MED ORDER — SODIUM CHLORIDE 0.9 % IV SOLN: 1.0000 mg/h | Freq: Once | INTRAVENOUS | Status: DC

## 2019-04-26 NOTE — Progress Notes (Addendum)
  Progress Note    04/26/2019 7:32 AM 1 Day Post-Op  Subjective:  Minimal pain.  Ready for discharge this morning.  Would prefer to change bandage herself later today.  Also would prefer to contact [facility] nurse to resume wound care herself as opposed to case manager arranging this here.   Vitals:   04/25/19 2146 04/26/19 0419  BP: (!) 149/77 (!) 164/70  Pulse: 67 73  Resp:  11  Temp:  97.6 F (36.4 C)  SpO2:  96%    Physical Exam: Incisions:  L BKA dressing left on.  No breakthrough bleeding   CBC    Component Value Date/Time   WBC 11.7 (H) 04/26/2019 0400   RBC 4.37 04/26/2019 0400   HGB 14.1 04/26/2019 0400   HCT 38.0 04/26/2019 0400   PLT 198 04/26/2019 0400   MCV 87.0 04/26/2019 0400   MCH 32.3 04/26/2019 0400   MCHC 37.1 (H) 04/26/2019 0400   RDW 14.4 04/26/2019 0400   LYMPHSABS 1.3 07/30/2013 0507   MONOABS 0.5 07/30/2013 0507   EOSABS 0.1 07/30/2013 0507   BASOSABS 0.1 07/30/2013 0507    BMET    Component Value Date/Time   NA 130 (L) 04/26/2019 0400   K 5.5 (H) 04/26/2019 0400   CL 88 (L) 04/26/2019 0400   CO2 24 04/26/2019 0400   GLUCOSE 278 (H) 04/26/2019 0400   BUN 68 (H) 04/26/2019 0400   CREATININE 7.29 (H) 04/26/2019 0400   CALCIUM 8.1 (L) 04/26/2019 0400   GFRNONAA 6 (L) 04/26/2019 0400   GFRAA 7 (L) 04/26/2019 0400    INR    Component Value Date/Time   INR 0.97 11/09/2017 1332     Intake/Output Summary (Last 24 hours) at 04/26/2019 0732 Last data filed at 04/25/2019 1800 Gross per 24 hour  Intake 601.5 ml  Output 10 ml  Net 591.5 ml     Assessment/Plan:  48 y.o. female is s/p left below knee amputation debridement 1 Day Post-Op  - patient will resume [place] RN for wound care; wet to dry dressing changes - D/c home this morning to dialyze as an outpatient - Follow up in 2 weeks with Dr. Rolanda Lundborg, PA-C [facility] [phone] 04/26/2019 7:32 AM   I have independently interviewed and examined  patient and agree with PA assessment and plan above.  I take the dressing down and evaluated the wound appears healthy.  She does have pain but wants to go home today.  She will contact her own home health nurse.  She will dialyze at 1230 at her home [facility] center.  Courtlynn Holloman C. Donzetta Matters, MD [facility] [place] Office: [phone] Pager: [phone]

## 2019-04-26 NOTE — Plan of Care (Signed)
Spoke with patient's [facility] unit in [facility] at [phone].  They state that the patient is able to dialyze there today as long as she is able to be there by 12:30 pm.    If she is stable for discharge this morning from a surgical standpoint she is acceptable to dialyze at her outpatient unit today from a renal standpoint.  If she is unable to be discharged this morning, we will dialyze her here.   Claudia Desanctis

## 2019-04-26 NOTE — Discharge Summary (Signed)
Physician Discharge Summary   Patient ID: Angela Soto [id number] y.o. September 19, 1971  Admit date: 04/25/2019  Discharge date and time: 04/26/19   Admitting Physician: Waynetta Sandy, MD   Discharge Physician: same  Admission Diagnoses: LEFT BELOW KNEE STUMP INFECTION  Discharge Diagnoses: same  Admission Condition: fair  Discharged Condition: fair  Indication for Admission: non healing BKA wound  Hospital Course: Angela Soto is a 48 year old female who is brought in as an outpatient for debridement of left below the knee amputation wound by Dr. Donzetta Matters on 04/25/2019.  She tolerated the procedure well and was admitted to the hospital overnight.  Nephrology was consulted for management of end-stage renal disease on hemodialysis.  Outpatient hemodialysis appointment was changed to facilitate a quicker discharge from hospital thus did not require hemodialysis treatment during hospitalization.  Patient will notify her home health RN of debridement and need for wet-to-dry dressing changes at least daily.  She will follow-up in office in about 2 to 3 weeks for wound check.  She was prescribed 2 to 3 days of narcotic pain medication for continued postoperative pain control.  Discharge instructions were reviewed with the patient and she voiced her understanding.  She will be discharged this morning to home with home health in stable condition.  Consults: nephrology  Treatments: surgery: L BKA debridement by Dr. Donzetta Matters 04/25/19  Discharge Exam:see progress note 04/26/19 Vitals:   04/25/19 2146 04/26/19 0419  BP: (!) 149/77 (!) 164/70  Pulse: 67 73  Resp:  11  Temp:  97.6 F (36.4 C)  SpO2:  96%    Disposition: Discharge disposition: 01-Home or Self Care       Patient Instructions:  Allergies as of 04/26/2019      Reactions   Metformin And Related Diarrhea, Nausea And Vomiting   Chantix [varenicline]    "EVIL DREAMS"   Ibuprofen Other (See Comments)   Cannot take  because of low kidney function      Medication List    TAKE these medications   aspirin EC 81 MG tablet Take 1 tablet (81 mg total) by mouth daily. What changed: when to take this   calcium acetate 667 MG capsule Commonly known as: PHOSLO Take 1,334-2,668 mg by mouth See admin instructions. Take 4 capsules (2668 mg) by mouth with meals and take 2 capsules (1334 mg) by mouth with snacks.   clopidogrel 75 MG tablet Commonly known as: PLAVIX Take 75 mg by mouth every evening.   famotidine 40 MG tablet Commonly known as: PEPCID Take 40 mg by mouth every evening.   Fish Oil 1000 MG Caps Take 1,000 mg by mouth 2 (two) times a day.   folic acid-vitamin b complex-vitamin c-selenium-zinc 3 MG Tabs tablet Take 1 tablet by mouth daily.   furosemide 80 MG tablet Commonly known as: LASIX Take 80 mg by mouth 2 (two) times a day.   gabapentin 100 MG capsule Commonly known as: NEURONTIN Take 100-200 mg by mouth 3 (three) times daily as needed (pain. (scheduled at night before bedtime)).   insulin glargine 100 UNIT/ML injection Commonly known as: LANTUS Inject 31-62 Units into the skin at bedtime as needed (for blood sugars greater than 150).   insulin lispro 100 UNIT/ML injection Commonly known as: HUMALOG Inject 12 Units into the skin 3 (three) times daily as needed for high blood sugar (blood sugars greater than 130).   isosorbide mononitrate 60 MG 24 hr tablet Commonly known as: IMDUR Take 60 mg in the  am, and 30 mg ( 1/2 tablet) in the pm What changed:   how much to take  how to take this  when to take this  additional instructions   levothyroxine 200 MCG tablet Commonly known as: SYNTHROID Take 200 mcg by mouth daily before breakfast.   lidocaine-prilocaine cream Commonly known as: EMLA Apply 1 application topically as needed (for pain/ apply prior to dialysis).   metoprolol succinate 100 MG 24 hr tablet Commonly known as: TOPROL-XL Take 100 mg by mouth 2 (two)  times a day.   nicotine polacrilex 4 MG lozenge Commonly known as: COMMIT Take 1 lozenge (4 mg total) by mouth as needed for smoking cessation.   Oxycodone HCl 10 MG Tabs Take 1 tablet (10 mg total) by mouth every 6 (six) hours as needed (for pain).   rosuvastatin 10 MG tablet Commonly known as: CRESTOR TAKE 1 TABLET BY MOUTH DAILY What changed: when to take this   topiramate 50 MG tablet Commonly known as: TOPAMAX Take 50 mg by mouth daily as needed (headaches.).      Activity: activity as tolerated Diet: regular diet Wound Care: wet to dry at least daily  Follow-up with Dr. Donzetta Matters in 2 weeks.  SignedDagoberto Ligas 04/26/2019 8:48 AM

## 2019-04-26 NOTE — Progress Notes (Signed)
Renal Navigator notified OP HD clinic/[facility] of patient's negative COVID 19 rapid test result and will fax H&P, Nephrology note and Discharge Summary to clinic in order to provide continuity of care.  Alphonzo Cruise, [place] Renal Navigator  [phone]

## 2019-04-26 NOTE — Progress Notes (Signed)
PA in room as RN receiving hand off report from night shift RN.  Vascular team aware of pt's need to be discharged ASAP in order to make it to her outpatient dialysis session at 1230 in [place].  She is medically cleared for discharge and PA will be entering orders this morning.  We are not to change bandage this morning, pt will follow up with her home health agency for dressing change later today.

## 2019-04-29 DIAGNOSIS — N186 End stage renal disease: Secondary | ICD-10-CM | POA: Diagnosis not present

## 2019-04-29 DIAGNOSIS — D509 Iron deficiency anemia, unspecified: Secondary | ICD-10-CM | POA: Diagnosis not present

## 2019-04-29 DIAGNOSIS — Z992 Dependence on renal dialysis: Secondary | ICD-10-CM | POA: Diagnosis not present

## 2019-05-01 DIAGNOSIS — N186 End stage renal disease: Secondary | ICD-10-CM | POA: Diagnosis not present

## 2019-05-01 DIAGNOSIS — D509 Iron deficiency anemia, unspecified: Secondary | ICD-10-CM | POA: Diagnosis not present

## 2019-05-01 DIAGNOSIS — Z992 Dependence on renal dialysis: Secondary | ICD-10-CM | POA: Diagnosis not present

## 2019-05-02 ENCOUNTER — Encounter: Payer: Self-pay | Admitting: Cardiovascular Disease

## 2019-05-02 ENCOUNTER — Telehealth (INDEPENDENT_AMBULATORY_CARE_PROVIDER_SITE_OTHER): Payer: Medicare Other | Admitting: Cardiovascular Disease

## 2019-05-02 VITALS — BP 140/80 | HR 64 | Ht <= 58 in

## 2019-05-02 DIAGNOSIS — E1122 Type 2 diabetes mellitus with diabetic chronic kidney disease: Secondary | ICD-10-CM | POA: Diagnosis not present

## 2019-05-02 DIAGNOSIS — N186 End stage renal disease: Secondary | ICD-10-CM

## 2019-05-02 DIAGNOSIS — I25118 Atherosclerotic heart disease of native coronary artery with other forms of angina pectoris: Secondary | ICD-10-CM

## 2019-05-02 DIAGNOSIS — E785 Hyperlipidemia, unspecified: Secondary | ICD-10-CM

## 2019-05-02 DIAGNOSIS — T8744 Infection of amputation stump, left lower extremity: Secondary | ICD-10-CM | POA: Diagnosis not present

## 2019-05-02 DIAGNOSIS — I10 Essential (primary) hypertension: Secondary | ICD-10-CM

## 2019-05-02 DIAGNOSIS — T8754 Necrosis of amputation stump, left lower extremity: Secondary | ICD-10-CM | POA: Diagnosis not present

## 2019-05-02 DIAGNOSIS — L03116 Cellulitis of left lower limb: Secondary | ICD-10-CM | POA: Diagnosis not present

## 2019-05-02 DIAGNOSIS — I120 Hypertensive chronic kidney disease with stage 5 chronic kidney disease or end stage renal disease: Secondary | ICD-10-CM | POA: Diagnosis not present

## 2019-05-02 DIAGNOSIS — I739 Peripheral vascular disease, unspecified: Secondary | ICD-10-CM

## 2019-05-02 NOTE — Progress Notes (Signed)
Virtual Visit via Telephone Note   This visit type was conducted due to national recommendations for restrictions regarding the COVID-19 Pandemic (e.g. social distancing) in an effort to limit this patient's exposure and mitigate transmission in our community.  Due to her co-morbid illnesses, this patient is at least at moderate risk for complications without adequate follow up.  This format is felt to be most appropriate for this patient at this time.  The patient did not have access to video technology/had technical difficulties with video requiring transitioning to audio format only (telephone).  All issues noted in this document were discussed and addressed.  No physical exam could be performed with this format.  Please refer to the patient's chart for her  consent to telehealth for [facility]   Date:  05/02/2019   ID:  Angela Soto, DOB 1971-01-08, MRN [id number]  Patient Location: Home Provider Location: Home  PCP:  Neale Burly, MD  Cardiologist:  Kate Sable, MD  Electrophysiologist:  None   Evaluation Performed:  Follow-Up Visit  Chief Complaint:  CAD  History of Present Illness:    Angela Soto is a 48 y.o. female with coronary artery disease.  She has a history of bilateral amputations with left below the knee amputation and had a large necrotic ulcer.  She underwent debridement by Dr. Donzetta Matters on 04/25/2019.  She is in a lot of pain from surgery. She said her home health nurse said she was doing well.  She denies chest pain and shortness of breath.  She follows with pain management.  The patient does not have symptoms concerning for COVID-19 infection (fever, chills, cough, or new shortness of breath).    Past Medical History:  Diagnosis Date   Coronary artery disease    a. s/p prior PCI in 1998 b. 10/2017: cath showing severe two-vessel CAD with heavy calcification along the LCx and 100% CTO of RCA with left to right collaterals noted. No good  options for PCI. Medical management recommended.    Environmental allergies    uses inhalers   ESRD (end stage renal disease) on dialysis [facility])    "started dialysis on 10/08/13; MWF; [facility]; [place], [place]"   GERD (gastroesophageal reflux disease)    Headache(784.0)    "weekly" (11/09/2017)   High cholesterol    Hypertension    Hypothyroidism    Multiple sclerosis ([place])    just diagnosed 2014   Myocardial infarction ([place]) 2025   Nonalcoholic steatohepatitis ([facility])    Peripheral vascular disease ([facility])    aortic artery occlusion   PONV (postoperative nausea and vomiting)    Thyroid cancer ([place])    Type 1 diabetes mellitus ([place])    Umbilical hernia    Past Surgical History:  Procedure Laterality Date   ABDOMINAL SURGERY  2006   aortic artery occluded, had to "clean it out"   AMPUTATION Left 09/22/2014   Procedure: AMPUTATION LEFT FOURTH FINGER;  Surgeon: Dayna Barker, MD;  Location: [place];  Service: Plastics;  Laterality: Left;   AMPUTATION Left 04/25/2019   Procedure: DEBRIDEMENT BELOW KNEE AMPUTATION;  Surgeon: Waynetta Sandy, MD;  Location: [place];  Service: Vascular;  Laterality: Left;   AV FISTULA PLACEMENT Left 09/24/2013   Procedure: LEFT UPPER EXTREMITY ARTERIOVENOUS (AV) FISTULA CREATION BRACHIAL/CEPHALIC;  Surgeon: Elam Dutch, MD;  Location: [place];  Service: Vascular;  Laterality: Left;   [place] Left 02/18/2014   Procedure: BASILIC VEIN TRANSPOSITION - 2ND STAGE LEFT ARM;  Surgeon:  Elam Dutch, MD;  Location: [place];  Service: Vascular;  Laterality: Left;   CARDIAC CATHETERIZATION     "numerous"   CARDIAC CATHETERIZATION  11/09/2017   CORONARY ANGIOPLASTY     CORONARY ANGIOPLASTY WITH STENT PLACEMENT     "think I have a total of 3 stents" (11/09/2017)   INSERTION OF DIALYSIS CATHETER Right 10/07/2013   Procedure: INSERTION OF DIALYSIS CATHETER;  Surgeon: Conrad Antelope, MD;  Location: [place];  Service: Vascular;   Laterality: Right;   IRRIGATION AND DEBRIDEMENT ABSCESS Right    LEFT HEART CATH AND CORONARY ANGIOGRAPHY N/A 11/09/2017   Procedure: LEFT HEART CATH AND CORONARY ANGIOGRAPHY;  Surgeon: Burnell Blanks, MD;  Location: [place] [facility];  Service: Cardiovascular;  Laterality: N/A;   LEG AMPUTATION BELOW KNEE Bilateral 2008-2010   left-right   PARATHYROIDECTOMY     SHUNTOGRAM Left 04/17/2014   Procedure: FISTULOGRAM;  Surgeon: Conrad Winfield, MD;  [facility] C[facility];  Service: Cardiovascular;  Laterality: Left;   THYROIDECTOMY       Current Meds  Medication Sig   aspirin EC 81 MG tablet Take 1 tablet (81 mg total) by mouth daily. (Patient taking differently: Take 81 mg by mouth 2 (two) times a day. )   calcium acetate (PHOSLO) 667 MG capsule Take 1,334-2,668 mg by mouth See admin instructions. Take 4 capsules (2668 mg) by mouth with meals and take 2 capsules (1334 mg) by mouth with snacks.   clopidogrel (PLAVIX) 75 MG tablet Take 75 mg by mouth every evening.    famotidine (PEPCID) 40 MG tablet Take 40 mg by mouth every evening.   folic acid-vitamin b complex-vitamin c-selenium-zinc (DIALYVITE) 3 MG TABS tablet Take 1 tablet by mouth daily.   furosemide (LASIX) 80 MG tablet Take 80 mg by mouth 2 (two) times a day.   gabapentin (NEURONTIN) 100 MG capsule Take 100-200 mg by mouth 3 (three) times daily as needed (pain. (scheduled at night before bedtime)).   insulin glargine (LANTUS) 100 UNIT/ML injection Inject 31-62 Units into the skin at bedtime as needed (for blood sugars greater than 150).    insulin lispro (HUMALOG) 100 UNIT/ML injection Inject 12 Units into the skin 3 (three) times daily as needed for high blood sugar (blood sugars greater than 130).    isosorbide mononitrate (IMDUR) 60 MG 24 hr tablet Take 60 mg in the am, and 30 mg ( 1/2 tablet) in the pm (Patient taking differently: Take 90 mg by mouth every evening. )   levothyroxine (SYNTHROID) 200 MCG tablet  Take 200 mcg by mouth daily before breakfast.   lidocaine-prilocaine (EMLA) cream Apply 1 application topically as needed (for pain/ apply prior to dialysis).   metoprolol succinate (TOPROL-XL) 100 MG 24 hr tablet Take 100 mg by mouth 2 (two) times a day.    Omega-3 Fatty Acids (FISH OIL) 1000 MG CAPS Take 1,000 mg by mouth 2 (two) times a day.    Oxycodone HCl 10 MG TABS Take 1 tablet (10 mg total) by mouth every 6 (six) hours as needed (for pain).   rosuvastatin (CRESTOR) 10 MG tablet TAKE 1 TABLET BY MOUTH DAILY (Patient taking differently: Take 10 mg by mouth every evening. )   topiramate (TOPAMAX) 50 MG tablet Take 50 mg by mouth daily as needed (headaches.).     Allergies:   Metformin and related, Chantix [varenicline], and Ibuprofen   Social History   Tobacco Use   Smoking status: Current Every Day Smoker  Packs/day: 1.00    Years: 32.00    Pack years: 32.00    Types: Cigarettes   Smokeless tobacco: Never Used   Tobacco comment: 1/2 - 3/4 packs per day  Substance Use Topics   Alcohol use: No   Drug use: No     Family Hx: The patient's family history includes CAD (age of onset: 75) in her father; Diabetes Mellitus II in her father and mother; Ovarian cancer in her mother.  ROS:   Please see the history of present illness.     All other systems reviewed and are negative.   Prior CV studies:   The following studies were reviewed today:  Cardiac Catheterization: 11/09/2017  Mid RCA to Dist RCA lesion is 100% stenosed.  1. Severe double vessel CAD 2. The LAD is a large caliber vessel that courses to the apex. The mid vessel has a long segment of moderate, calcific stenosis. This does not appear to be flow limiting. The first diagonal is a small caliber vessel with moderate ostial stenosis. The second diagonal branch is a small caliber vessel with mild plaque.  3. The Circumflex is a moderate caliber, heavily calcified vessel from the ostium down into the  obtuse marginal branches. There is moderately severe, heavily calcified stenosis from the proximal vessel down into the most distal obtuse marginal branch. The obtuse marginal branch is completely occluded. It appears that the distal segment of this branch fills from bridging collaterals. The vessel is small in caliber after it reconstitutes.  4. The RCA is a large dominant vessel with 100% occlusion just beyond the ostium. The distal vessel and distal branches are seen to fill from left to right collaterals supplied by septal perforators.  5. LV systolic function is overall preserved with segmental wall motion abnormality. LVEF estimated around 45-50%.   Recommendations: She has complex CAD with no good options for revascularization. Her RCA is chronically occluded and fills from collaterals. The entire Circumflex is diffusely diseased and heavily calcified with a distal chronic total occlusion of the OM branch. I would not approach this vessel with PCI. If PCI of the Circumflex were considered, we would have to perform atherectomy of the entire vessel and likely could not cross the distal CTO. The entire vessel would need to be stented and the risk of restenosis would be very high in this patient with diabetes and ongoing tobacco abuse. She is not a candidate for CABG given her comorbid conditions including ESRD and the lack of graft conduit given bilateral lower extremity amputations. At this time, I would attempt to control her symptoms with medical therapy. She should consider stopping smoking as well.   Echocardiogram: 10/2017 Study Conclusions  - Left ventricle: The cavity size was normal. Wall thickness was increased in a pattern of mild LVH. Systolic function was normal. The estimated ejection fraction was in the range of 50% to 55%. There is hypokinesis of the inferolateral myocardium. Features are consistent with a pseudonormal left ventricular filling pattern, with concomitant  abnormal relaxation and increased filling pressure (grade 2 diastolic dysfunction). Doppler parameters are consistent with high ventricular filling pressure. - Mitral valve: Calcified annulus. - Left atrium: The atrium was severely dilated.  Impressions:  - Hypokinesis of the inferolateral wall with overall low normal LV systolic function; mild LVH; moderate diastolic dysfunction; severe LAE.  Labs/Other Tests and Data Reviewed:    EKG:  No ECG reviewed.  Recent Labs: 04/25/2019: ALT 19 04/26/2019: BUN 68; Creatinine, Ser 7.29; Hemoglobin 14.1;  Platelets 198; Potassium 5.5; Sodium 130   Recent Lipid Panel Lab Results  Component Value Date/Time   CHOL 185 11/10/2017 05:27 AM   TRIG 295 (H) 11/10/2017 05:27 AM   HDL 24 (L) 11/10/2017 05:27 AM   CHOLHDL 7.7 11/10/2017 05:27 AM   LDLCALC 102 (H) 11/10/2017 05:27 AM    Wt Readings from Last 3 Encounters:  04/26/19 223 lb 12.3 oz (101.5 kg)  04/17/19 202 lb (91.6 kg)  04/12/19 202 lb (91.6 kg)     Objective:    Vital Signs:  BP 140/80    Pulse 64    Ht 4\' 3"  (1.295 m)    LMP 05/24/2013    SpO2 98%    BMI 60.49 kg/m    VITAL SIGNS:  reviewed  ASSESSMENT & PLAN:    1.  Coronary artery disease: Cardiac catheterization from January 2019 reviewed above.  Medical management has been recommended as she is not felt to be suitable candidate for CABG and there are no good options for percutaneous revascularization. Symptomatically stable. Continue aspirin, Plavix, Imdur, beta-blocker, and statin therapy.  2.  Hypertension: BP is mildly elevated. No changes today.  3.  Hyperlipidemia: Continue statin therapy.  4.  End-stage renal disease: On hemodialysis. She is on Lasix. She sporadically makes urine.  5. PVD: Followed by vascular surgery. On ASA, Plavix, and statin.   COVID-19 Education: The signs and symptoms of COVID-19 were discussed with the patient and how to seek care for testing (follow up with PCP or arrange  E-visit).  The importance of social distancing was discussed today.  Time:   Today, I have spent 15 minutes with the patient with telehealth technology discussing the above problems.     Medication Adjustments/Labs and Tests Ordered: Current medicines are reviewed at length with the patient today.  Concerns regarding medicines are outlined above.   Tests Ordered: No orders of the defined types were placed in this encounter.   Medication Changes: No orders of the defined types were placed in this encounter.   Follow Up:  Virtual Visit in 6 month(s) with APP  Signed, Kate Sable, MD  05/02/2019 1:22 PM    [facility]

## 2019-05-02 NOTE — Patient Instructions (Signed)

## 2019-05-03 DIAGNOSIS — D509 Iron deficiency anemia, unspecified: Secondary | ICD-10-CM | POA: Diagnosis not present

## 2019-05-03 DIAGNOSIS — N186 End stage renal disease: Secondary | ICD-10-CM | POA: Diagnosis not present

## 2019-05-03 DIAGNOSIS — Z992 Dependence on renal dialysis: Secondary | ICD-10-CM | POA: Diagnosis not present

## 2019-05-06 DIAGNOSIS — I120 Hypertensive chronic kidney disease with stage 5 chronic kidney disease or end stage renal disease: Secondary | ICD-10-CM | POA: Diagnosis not present

## 2019-05-06 DIAGNOSIS — Z89612 Acquired absence of left leg above knee: Secondary | ICD-10-CM | POA: Diagnosis not present

## 2019-05-06 DIAGNOSIS — L03116 Cellulitis of left lower limb: Secondary | ICD-10-CM | POA: Diagnosis not present

## 2019-05-06 DIAGNOSIS — D509 Iron deficiency anemia, unspecified: Secondary | ICD-10-CM | POA: Diagnosis not present

## 2019-05-06 DIAGNOSIS — Z794 Long term (current) use of insulin: Secondary | ICD-10-CM | POA: Diagnosis not present

## 2019-05-06 DIAGNOSIS — Z89022 Acquired absence of left finger(s): Secondary | ICD-10-CM | POA: Diagnosis not present

## 2019-05-06 DIAGNOSIS — F329 Major depressive disorder, single episode, unspecified: Secondary | ICD-10-CM | POA: Diagnosis not present

## 2019-05-06 DIAGNOSIS — F17210 Nicotine dependence, cigarettes, uncomplicated: Secondary | ICD-10-CM | POA: Diagnosis not present

## 2019-05-06 DIAGNOSIS — Z992 Dependence on renal dialysis: Secondary | ICD-10-CM | POA: Diagnosis not present

## 2019-05-06 DIAGNOSIS — E1122 Type 2 diabetes mellitus with diabetic chronic kidney disease: Secondary | ICD-10-CM | POA: Diagnosis not present

## 2019-05-06 DIAGNOSIS — T8744 Infection of amputation stump, left lower extremity: Secondary | ICD-10-CM | POA: Diagnosis not present

## 2019-05-06 DIAGNOSIS — G35 Multiple sclerosis: Secondary | ICD-10-CM | POA: Diagnosis not present

## 2019-05-06 DIAGNOSIS — Z9049 Acquired absence of other specified parts of digestive tract: Secondary | ICD-10-CM | POA: Diagnosis not present

## 2019-05-06 DIAGNOSIS — Z89611 Acquired absence of right leg above knee: Secondary | ICD-10-CM | POA: Diagnosis not present

## 2019-05-06 DIAGNOSIS — T8754 Necrosis of amputation stump, left lower extremity: Secondary | ICD-10-CM | POA: Diagnosis not present

## 2019-05-06 DIAGNOSIS — N186 End stage renal disease: Secondary | ICD-10-CM | POA: Diagnosis not present

## 2019-05-06 DIAGNOSIS — Z7902 Long term (current) use of antithrombotics/antiplatelets: Secondary | ICD-10-CM | POA: Diagnosis not present

## 2019-05-07 DIAGNOSIS — M169 Osteoarthritis of hip, unspecified: Secondary | ICD-10-CM | POA: Diagnosis not present

## 2019-05-07 DIAGNOSIS — G894 Chronic pain syndrome: Secondary | ICD-10-CM | POA: Diagnosis not present

## 2019-05-07 DIAGNOSIS — M25561 Pain in right knee: Secondary | ICD-10-CM | POA: Diagnosis not present

## 2019-05-07 DIAGNOSIS — M47816 Spondylosis without myelopathy or radiculopathy, lumbar region: Secondary | ICD-10-CM | POA: Diagnosis not present

## 2019-05-08 DIAGNOSIS — Z992 Dependence on renal dialysis: Secondary | ICD-10-CM | POA: Diagnosis not present

## 2019-05-08 DIAGNOSIS — N186 End stage renal disease: Secondary | ICD-10-CM | POA: Diagnosis not present

## 2019-05-08 DIAGNOSIS — D509 Iron deficiency anemia, unspecified: Secondary | ICD-10-CM | POA: Diagnosis not present

## 2019-05-09 DIAGNOSIS — T8754 Necrosis of amputation stump, left lower extremity: Secondary | ICD-10-CM | POA: Diagnosis not present

## 2019-05-09 DIAGNOSIS — T8744 Infection of amputation stump, left lower extremity: Secondary | ICD-10-CM | POA: Diagnosis not present

## 2019-05-09 DIAGNOSIS — I120 Hypertensive chronic kidney disease with stage 5 chronic kidney disease or end stage renal disease: Secondary | ICD-10-CM | POA: Diagnosis not present

## 2019-05-09 DIAGNOSIS — L03116 Cellulitis of left lower limb: Secondary | ICD-10-CM | POA: Diagnosis not present

## 2019-05-09 DIAGNOSIS — G35 Multiple sclerosis: Secondary | ICD-10-CM | POA: Diagnosis not present

## 2019-05-09 DIAGNOSIS — E1122 Type 2 diabetes mellitus with diabetic chronic kidney disease: Secondary | ICD-10-CM | POA: Diagnosis not present

## 2019-05-10 DIAGNOSIS — N186 End stage renal disease: Secondary | ICD-10-CM | POA: Diagnosis not present

## 2019-05-10 DIAGNOSIS — D509 Iron deficiency anemia, unspecified: Secondary | ICD-10-CM | POA: Diagnosis not present

## 2019-05-10 DIAGNOSIS — Z992 Dependence on renal dialysis: Secondary | ICD-10-CM | POA: Diagnosis not present

## 2019-05-13 DIAGNOSIS — N186 End stage renal disease: Secondary | ICD-10-CM | POA: Diagnosis not present

## 2019-05-13 DIAGNOSIS — D509 Iron deficiency anemia, unspecified: Secondary | ICD-10-CM | POA: Diagnosis not present

## 2019-05-13 DIAGNOSIS — Z992 Dependence on renal dialysis: Secondary | ICD-10-CM | POA: Diagnosis not present

## 2019-05-15 DIAGNOSIS — Z992 Dependence on renal dialysis: Secondary | ICD-10-CM | POA: Diagnosis not present

## 2019-05-15 DIAGNOSIS — D509 Iron deficiency anemia, unspecified: Secondary | ICD-10-CM | POA: Diagnosis not present

## 2019-05-15 DIAGNOSIS — N186 End stage renal disease: Secondary | ICD-10-CM | POA: Diagnosis not present

## 2019-05-16 ENCOUNTER — Telehealth (HOSPITAL_COMMUNITY): Payer: Self-pay | Admitting: Rehabilitation

## 2019-05-16 DIAGNOSIS — I25118 Atherosclerotic heart disease of native coronary artery with other forms of angina pectoris: Secondary | ICD-10-CM | POA: Diagnosis not present

## 2019-05-16 DIAGNOSIS — G35 Multiple sclerosis: Secondary | ICD-10-CM | POA: Diagnosis not present

## 2019-05-16 DIAGNOSIS — I10 Essential (primary) hypertension: Secondary | ICD-10-CM | POA: Diagnosis not present

## 2019-05-16 DIAGNOSIS — E1122 Type 2 diabetes mellitus with diabetic chronic kidney disease: Secondary | ICD-10-CM | POA: Diagnosis not present

## 2019-05-16 DIAGNOSIS — E119 Type 2 diabetes mellitus without complications: Secondary | ICD-10-CM | POA: Diagnosis not present

## 2019-05-16 DIAGNOSIS — T8744 Infection of amputation stump, left lower extremity: Secondary | ICD-10-CM | POA: Diagnosis not present

## 2019-05-16 DIAGNOSIS — T8754 Necrosis of amputation stump, left lower extremity: Secondary | ICD-10-CM | POA: Diagnosis not present

## 2019-05-16 DIAGNOSIS — L03116 Cellulitis of left lower limb: Secondary | ICD-10-CM | POA: Diagnosis not present

## 2019-05-16 DIAGNOSIS — I120 Hypertensive chronic kidney disease with stage 5 chronic kidney disease or end stage renal disease: Secondary | ICD-10-CM | POA: Diagnosis not present

## 2019-05-16 NOTE — Telephone Encounter (Signed)

## 2019-05-17 ENCOUNTER — Encounter: Payer: Self-pay | Admitting: *Deleted

## 2019-05-17 ENCOUNTER — Other Ambulatory Visit: Payer: Self-pay | Admitting: *Deleted

## 2019-05-17 ENCOUNTER — Other Ambulatory Visit: Payer: Self-pay

## 2019-05-17 ENCOUNTER — Encounter: Payer: Self-pay | Admitting: Family

## 2019-05-17 ENCOUNTER — Ambulatory Visit (INDEPENDENT_AMBULATORY_CARE_PROVIDER_SITE_OTHER): Payer: Medicare Other | Admitting: Family

## 2019-05-17 VITALS — BP 123/69 | HR 59 | Temp 97.5°F | Resp 16 | Ht <= 58 in | Wt 198.0 lb

## 2019-05-17 DIAGNOSIS — Z89511 Acquired absence of right leg below knee: Secondary | ICD-10-CM

## 2019-05-17 DIAGNOSIS — I998 Other disorder of circulatory system: Secondary | ICD-10-CM

## 2019-05-17 DIAGNOSIS — T8789 Other complications of amputation stump: Secondary | ICD-10-CM | POA: Diagnosis not present

## 2019-05-17 DIAGNOSIS — N186 End stage renal disease: Secondary | ICD-10-CM

## 2019-05-17 DIAGNOSIS — Z992 Dependence on renal dialysis: Secondary | ICD-10-CM | POA: Diagnosis not present

## 2019-05-17 NOTE — Patient Instructions (Signed)

## 2019-05-17 NOTE — Progress Notes (Signed)
[facility] [place]   CC: to discuss with Dr. Donzetta Matters left AKA, large necrotic left BKA wound, severe pain proximal to wound   History of Present Illness Angela Soto is a 48 y.o. 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    She is on dialysis and has had a left finger amputated in the past.   Currently dialyzes via left upper arm AV fistula which she states is working well. She dialyses MWF. She states that she is unable to do a full HD session due to the pain at her left knee worsening during HD.   Mostly feeling the wound in her leg which is causing her significant pain preventing her from sleep.  Dr. Donzetta Matters last evaluated pt on 04-12-19. At that time regarding her dialysis access she stated that was working and although she had had bleeding recently she did not want any intervention at that time but Dr. Donzetta Matters did discuss possible fistulogram in the future.  Regarding her left below-knee amputation site there was a large ulcer which he believes is going to require above-knee amputation.  She was going to discuss this with her husband and they want to attempt debridement but he told her if there is underlying bone in the wound it will certainly not heal and will require ongoing wound care and further surgeries.  More than likely she will need to choose above-knee amputation on the left and we can schedule this when she is ready. She returns today to discuss this with Dr. Donzetta Matters.   She denies fever or chills. She is not taking any po antibx. She is wearing lidocaine patches just proximal to the necrotic left BKA wound. She states that she has no sensation or pain in the left BKA wound, that her pain is proximal to the wound, st the knee and lower thigh.   She denies dyspnea or chest pain.  She is on pain contract with [facility], Dr. Megan Salon.     Diabetic: Yes  Tobacco use: smoker  (1 ppd x 30 yrs)  Pt meds include: Statin :Yes Betablocker: Yes ASA: Yes Other anticoagulants/antiplatelets: Plavix  Past Medical History:  Diagnosis Date  . Coronary artery disease    a. s/p prior PCI in 1998 b. 10/2017: cath showing severe two-vessel CAD with heavy calcification along the LCx and 100% CTO of RCA with left to right collaterals noted. No good options for PCI. Medical management recommended.   . Environmental allergies    uses inhalers  . ESRD (end stage renal disease) on dialysis [facility])    "started dialysis on 10/08/13; MWF; [facility]; [place], [place]"  . GERD (gastroesophageal reflux disease)   . SAYTKZSW(109.3)    "weekly" (11/09/2017)  . High cholesterol   . Hypertension   . Hypothyroidism   . Multiple sclerosis ([place])    just diagnosed 2014  . Myocardial infarction ([place]) 1998  . Nonalcoholic steatohepatitis ([facility])   . Peripheral vascular disease ([place])    aortic artery occlusion  . PONV (postoperative nausea and vomiting)   . Thyroid cancer ([place])   . Type 1 diabetes mellitus ([place])   . Umbilical hernia     Social History Social History   Tobacco Use  . Smoking status: Current Every Day Smoker    Packs/day: 1.00    Years: 32.00    Pack years: 32.00    Types: Cigarettes  . Smokeless  tobacco: Never Used  . Tobacco comment: 1/2 - 3/4 packs per day  Substance Use Topics  . Alcohol use: No  . Drug use: No    Family History Family History  Problem Relation Age of Onset  . Diabetes Mellitus II Mother   . Ovarian cancer Mother   . CAD Father 13  . Diabetes Mellitus II Father     Past Surgical History:  Procedure Laterality Date  . ABDOMINAL SURGERY  2006   aortic artery occluded, had to "clean it out"  . AMPUTATION Left 09/22/2014   Procedure: AMPUTATION LEFT FOURTH FINGER;  Surgeon: Dayna Barker, MD;  Location: [place];  Service: Plastics;  Laterality: Left;  . AMPUTATION Left 04/25/2019   Procedure: DEBRIDEMENT BELOW KNEE AMPUTATION;   Surgeon: Waynetta Sandy, MD;  Location: [place];  Service: Vascular;  Laterality: Left;  . AV FISTULA PLACEMENT Left 09/24/2013   Procedure: LEFT UPPER EXTREMITY ARTERIOVENOUS (AV) FISTULA CREATION BRACHIAL/CEPHALIC;  Surgeon: Elam Dutch, MD;  Location: [place];  Service: Vascular;  Laterality: Left;  . BASCILIC VEIN TRANSPOSITION Left 02/18/2014   Procedure: BASILIC VEIN TRANSPOSITION - 2ND STAGE LEFT ARM;  Surgeon: Elam Dutch, MD;  Location: [place];  Service: Vascular;  Laterality: Left;  . CARDIAC CATHETERIZATION     "numerous"  . CARDIAC CATHETERIZATION  11/09/2017  . CORONARY ANGIOPLASTY    . CORONARY ANGIOPLASTY WITH STENT PLACEMENT     "think I have a total of 3 stents" (11/09/2017)  . INSERTION OF DIALYSIS CATHETER Right 10/07/2013   Procedure: INSERTION OF DIALYSIS CATHETER;  Surgeon: Conrad Utica, MD;  Location: [place];  Service: Vascular;  Laterality: Right;  . IRRIGATION AND DEBRIDEMENT ABSCESS Right   . LEFT HEART CATH AND CORONARY ANGIOGRAPHY N/A 11/09/2017   Procedure: LEFT HEART CATH AND CORONARY ANGIOGRAPHY;  Surgeon: Burnell Blanks, MD;  Location: [place] CV LAB;  Service: Cardiovascular;  Laterality: N/A;  . LEG AMPUTATION BELOW KNEE Bilateral 2008-2010   left-right  . PARATHYROIDECTOMY    . SHUNTOGRAM Left 04/17/2014   Procedure: FISTULOGRAM;  Surgeon: Conrad Hopkins, MD;  [facility];  Service: Cardiovascular;  Laterality: Left;  . THYROIDECTOMY      Allergies  Allergen Reactions  . Metformin And Related Diarrhea and Nausea And Vomiting  . Chantix [Varenicline]     "EVIL DREAMS"    Current Outpatient Medications  Medication Sig Dispense Refill  . aspirin EC 81 MG tablet Take 1 tablet (81 mg total) by mouth daily. (Patient taking differently: Take 81 mg by mouth 2 (two) times a day. )    . calcium acetate (PHOSLO) 667 MG capsule Take 1,334-2,668 mg by mouth See admin instructions. Take 4 capsules (2668 mg) by mouth with meals and  take 2 capsules (1334 mg) by mouth with snacks.    . clopidogrel (PLAVIX) 75 MG tablet Take 75 mg by mouth every evening.     . famotidine (PEPCID) 40 MG tablet Take 40 mg by mouth every evening.    . folic acid-vitamin b complex-vitamin c-selenium-zinc (DIALYVITE) 3 MG TABS tablet Take 1 tablet by mouth daily.    . furosemide (LASIX) 80 MG tablet Take 80 mg by mouth 2 (two) times a day.    . gabapentin (NEURONTIN) 100 MG capsule Take 100-200 mg by mouth 3 (three) times daily as needed (pain. (scheduled at night before bedtime)).    Marland Kitchen insulin glargine (LANTUS) 100 UNIT/ML injection Inject 31-62 Units into the skin at  bedtime as needed (for blood sugars greater than 150).     . insulin lispro (HUMALOG) 100 UNIT/ML injection Inject 12 Units into the skin 3 (three) times daily as needed for high blood sugar (blood sugars greater than 130).     . isosorbide mononitrate (IMDUR) 60 MG 24 hr tablet Take 60 mg in the am, and 30 mg ( 1/2 tablet) in the pm (Patient taking differently: Take 90 mg by mouth every evening. ) 135 tablet 3  . levothyroxine (SYNTHROID) 200 MCG tablet Take 200 mcg by mouth daily before breakfast.    . lidocaine-prilocaine (EMLA) cream Apply 1 application topically as needed (for pain/ apply prior to dialysis).    . metoprolol succinate (TOPROL-XL) 100 MG 24 hr tablet Take 100 mg by mouth 2 (two) times a day.     . Omega-3 Fatty Acids (FISH OIL) 1000 MG CAPS Take 1,000 mg by mouth 2 (two) times a day.     . Oxycodone HCl 10 MG TABS Take 1 tablet (10 mg total) by mouth every 6 (six) hours as needed (for pain). 15 tablet 0  . rosuvastatin (CRESTOR) 10 MG tablet TAKE 1 TABLET BY MOUTH DAILY (Patient taking differently: Take 10 mg by mouth every evening. ) 90 tablet 3  . topiramate (TOPAMAX) 50 MG tablet Take 50 mg by mouth daily as needed (headaches.).     No current facility-administered medications for this visit.     ROS: See HPI for pertinent positives and negatives.   Physical  Examination  Vitals:   05/17/19 1033  BP: 123/69  Pulse: (!) 59  Resp: 16  Temp: (!) 97.5 F (36.4 C)  TempSrc: Temporal  SpO2: 97%  Weight: 198 lb (89.8 kg)  Height: 4\' 3"  (1.295 m)   Body mass index is 53.52 kg/m.  General: A&O x 3, obese female seated in her motorized w/c, holding her head, weepy, c/o pain at left knee.  HEENT: No gross abnormalities.  Pulmonary: Respirations are non labored, fair air movement in all fields, no rales, rhonchi, or wheezes Cardiac: regular rhythm, + murmur.      Radial pulses: right I faintly palpable, left is not palpable  Left upper arm AVF with strong thrill Adominal aortic pulse is not palpable                         VASCULAR EXAM: Extremities with ischemic changes, with Gangrene; with open wound; see photos below   Left BKA large necrotic wound     left BKA large necrotic wound                                                                                                           LE Pulses Right Left       FEMORAL  not palpable seated, obese  not palpable        POPLITEAL  not palpable   not palpable       POSTERIOR TIBIAL  BKA   BKA  DORSALIS PEDIS      ANTERIOR TIBIAL BKA  BKA    Abdomen: softly obese, NT, no palpable masses. Skin: no rashes, no cellulitis, no ulcers noted. Musculoskeletal: no muscle wasting or atrophy.  Neurologic: A&O X 3; appropriate affect, Sensation is normal; MOTOR FUNCTION:  moving all extremities equally, motor strength 5/5 throughout. Speech is fluent/normal. CN 2-12 intact. Psychiatric: Thought content is subdued, c/o left knee pain, mood is weepy, c/o left knee pain.     ASSESSMENT: Angela Soto is a 48 y.o. female who presents with: necrotic large wound at left BKA, no pain at the wound, c/o severe pain at her left knee and left lower thigh. She denies fever or chills. She has no problems with her right BKA, no wounds at the right BKA.  She is on pain contract with  [facility].     See Plan.   PLAN:  Based on the patient's HPI and examination, and after Dr. Donzetta Matters spoke with and examined pt, pt will be scheduled for left AKA on 05-21-19 by Dr. Donzetta Matters. Dr. Donzetta Matters advised that she remain in the hospital a few days afterward to evaluate for AKA healing and pain management.  I discussed in depth with the patient the nature of atherosclerosis, and emphasized the importance of maximal medical management including strict control of blood pressure, blood glucose, and lipid levels, obtaining regular exercise, and cessation of smoking.  The patient is aware that without maximal medical management the underlying atherosclerotic disease process will progress, limiting the benefit of any interventions.  The patient was given information about PAD including signs, symptoms, treatment.   Clemon Chambers, RN, MSN, FNP-C [facility] Phone: [phone]  Clinic MD: Donzetta Matters  05/17/19 11:00 AM

## 2019-05-18 DIAGNOSIS — D509 Iron deficiency anemia, unspecified: Secondary | ICD-10-CM | POA: Diagnosis not present

## 2019-05-18 DIAGNOSIS — N186 End stage renal disease: Secondary | ICD-10-CM | POA: Diagnosis not present

## 2019-05-18 DIAGNOSIS — Z992 Dependence on renal dialysis: Secondary | ICD-10-CM | POA: Diagnosis not present

## 2019-05-20 ENCOUNTER — Encounter (HOSPITAL_COMMUNITY): Payer: Self-pay | Admitting: *Deleted

## 2019-05-20 ENCOUNTER — Other Ambulatory Visit: Payer: Self-pay

## 2019-05-20 DIAGNOSIS — Z992 Dependence on renal dialysis: Secondary | ICD-10-CM | POA: Diagnosis not present

## 2019-05-20 DIAGNOSIS — N186 End stage renal disease: Secondary | ICD-10-CM | POA: Diagnosis not present

## 2019-05-20 DIAGNOSIS — D509 Iron deficiency anemia, unspecified: Secondary | ICD-10-CM | POA: Diagnosis not present

## 2019-05-20 NOTE — Progress Notes (Signed)
Spoke with pt for pre-op call. Pt has hx of CAD with stents, cardiologist is Dr. Jacinta Shoe. Pt denies any recent chest pain or sob. Pt is a type 1 diabetic. Last A1C was 6.1 three weeks ago per pt. Pt states her fasting blood sugar is usually between 98-125. Pt uses Lantus only as needed in the evenings. She states she hasn't had to use any in the past week. I instructed her to take 1/2 of whatever dose she would give herself if she needs to use the Lantus this evening. Instructed pt to check her blood sugar in the AM when she gets up and every 2 hours until she leaves for the hospital. If blood sugar is >220 take 1/2 of usual correction dose of Novolog insulin. If blood sugar is 70 or below, treat with 1/2 cup of clear juice (apple or cranberry) and recheck blood sugar 15 minutes after drinking juice.  Pt will be arriving via [facility] [employer]. She will have a friend, Dierdre Searles waiting in the waiting room.  Pt will need to have her Covid test done in the AM. Pt was instructed by Dr. Claretha Cooper office to arrive at 8:00 AM.   Coronavirus Screening  Have you experienced the following symptoms:  Cough  NO Fever (>100.36F)  NO Runny nose NO Sore throat  NO Difficulty breathing/shortness of breath  NO  Have you or a family member traveled in the last 14 days and where? NO    Patient reminded that hospital visitation restrictions are in effect and the importance of the restrictions. Informed pt that admitted patients can have 1 visitor or support visit during visiting hours. Pt voiced understanding.

## 2019-05-21 ENCOUNTER — Encounter (HOSPITAL_COMMUNITY): Payer: Self-pay

## 2019-05-21 ENCOUNTER — Inpatient Hospital Stay (HOSPITAL_COMMUNITY)
Admission: RE | Admit: 2019-05-21 | Discharge: 2019-05-23 | DRG: 474 | Disposition: A | Discharge status: Home Health | Payer: Medicare Other | Attending: Vascular Surgery | Admitting: Vascular Surgery

## 2019-05-21 ENCOUNTER — Encounter (HOSPITAL_COMMUNITY)
Admission: RE | Disposition: A | Discharge status: Home Health | Payer: Self-pay | Source: Home / Self Care | Attending: Vascular Surgery

## 2019-05-21 ENCOUNTER — Inpatient Hospital Stay (HOSPITAL_COMMUNITY): Payer: Medicare Other

## 2019-05-21 DIAGNOSIS — Y835 Amputation of limb(s) as the cause of abnormal reaction of the patient, or of later complication, without mention of misadventure at the time of the procedure: Secondary | ICD-10-CM | POA: Diagnosis present

## 2019-05-21 DIAGNOSIS — G35 Multiple sclerosis: Secondary | ICD-10-CM | POA: Diagnosis not present

## 2019-05-21 DIAGNOSIS — I70202 Unspecified atherosclerosis of native arteries of extremities, left leg: Secondary | ICD-10-CM | POA: Diagnosis not present

## 2019-05-21 DIAGNOSIS — F17210 Nicotine dependence, cigarettes, uncomplicated: Secondary | ICD-10-CM | POA: Diagnosis present

## 2019-05-21 DIAGNOSIS — Z85850 Personal history of malignant neoplasm of thyroid: Secondary | ICD-10-CM | POA: Diagnosis not present

## 2019-05-21 DIAGNOSIS — Z794 Long term (current) use of insulin: Secondary | ICD-10-CM | POA: Diagnosis not present

## 2019-05-21 DIAGNOSIS — E1151 Type 2 diabetes mellitus with diabetic peripheral angiopathy without gangrene: Secondary | ICD-10-CM | POA: Diagnosis present

## 2019-05-21 DIAGNOSIS — E7800 Pure hypercholesterolemia, unspecified: Secondary | ICD-10-CM | POA: Diagnosis present

## 2019-05-21 DIAGNOSIS — F329 Major depressive disorder, single episode, unspecified: Secondary | ICD-10-CM | POA: Diagnosis present

## 2019-05-21 DIAGNOSIS — I252 Old myocardial infarction: Secondary | ICD-10-CM

## 2019-05-21 DIAGNOSIS — E890 Postprocedural hypothyroidism: Secondary | ICD-10-CM | POA: Diagnosis present

## 2019-05-21 DIAGNOSIS — T8789 Other complications of amputation stump: Secondary | ICD-10-CM | POA: Diagnosis not present

## 2019-05-21 DIAGNOSIS — K219 Gastro-esophageal reflux disease without esophagitis: Secondary | ICD-10-CM | POA: Diagnosis present

## 2019-05-21 DIAGNOSIS — I120 Hypertensive chronic kidney disease with stage 5 chronic kidney disease or end stage renal disease: Secondary | ICD-10-CM | POA: Diagnosis present

## 2019-05-21 DIAGNOSIS — Z89511 Acquired absence of right leg below knee: Secondary | ICD-10-CM

## 2019-05-21 DIAGNOSIS — I2510 Atherosclerotic heart disease of native coronary artery without angina pectoris: Secondary | ICD-10-CM | POA: Diagnosis present

## 2019-05-21 DIAGNOSIS — T8754 Necrosis of amputation stump, left lower extremity: Secondary | ICD-10-CM | POA: Diagnosis not present

## 2019-05-21 DIAGNOSIS — E1122 Type 2 diabetes mellitus with diabetic chronic kidney disease: Secondary | ICD-10-CM | POA: Diagnosis present

## 2019-05-21 DIAGNOSIS — N186 End stage renal disease: Secondary | ICD-10-CM | POA: Diagnosis not present

## 2019-05-21 DIAGNOSIS — Z833 Family history of diabetes mellitus: Secondary | ICD-10-CM | POA: Diagnosis not present

## 2019-05-21 DIAGNOSIS — Z6841 Body Mass Index (BMI) 40.0 and over, adult: Secondary | ICD-10-CM | POA: Diagnosis not present

## 2019-05-21 DIAGNOSIS — E8889 Other specified metabolic disorders: Secondary | ICD-10-CM | POA: Diagnosis present

## 2019-05-21 DIAGNOSIS — Z79899 Other long term (current) drug therapy: Secondary | ICD-10-CM

## 2019-05-21 DIAGNOSIS — Z20828 Contact with and (suspected) exposure to other viral communicable diseases: Secondary | ICD-10-CM | POA: Diagnosis present

## 2019-05-21 DIAGNOSIS — Z7902 Long term (current) use of antithrombotics/antiplatelets: Secondary | ICD-10-CM

## 2019-05-21 DIAGNOSIS — K7581 Nonalcoholic steatohepatitis (NASH): Secondary | ICD-10-CM | POA: Diagnosis present

## 2019-05-21 DIAGNOSIS — Z7982 Long term (current) use of aspirin: Secondary | ICD-10-CM

## 2019-05-21 DIAGNOSIS — Z992 Dependence on renal dialysis: Secondary | ICD-10-CM

## 2019-05-21 DIAGNOSIS — D631 Anemia in chronic kidney disease: Secondary | ICD-10-CM | POA: Diagnosis present

## 2019-05-21 DIAGNOSIS — M79662 Pain in left lower leg: Secondary | ICD-10-CM | POA: Diagnosis not present

## 2019-05-21 DIAGNOSIS — Z955 Presence of coronary angioplasty implant and graft: Secondary | ICD-10-CM | POA: Diagnosis not present

## 2019-05-21 DIAGNOSIS — Z89619 Acquired absence of unspecified leg above knee: Secondary | ICD-10-CM

## 2019-05-21 DIAGNOSIS — Z79890 Hormone replacement therapy (postmenopausal): Secondary | ICD-10-CM

## 2019-05-21 DIAGNOSIS — L97929 Non-pressure chronic ulcer of unspecified part of left lower leg with unspecified severity: Secondary | ICD-10-CM | POA: Diagnosis not present

## 2019-05-21 HISTORY — DX: Depression, unspecified: F32.A

## 2019-05-21 HISTORY — PX: ABOVE KNEE LEG AMPUTATION: SUR20

## 2019-05-21 HISTORY — PX: AMPUTATION: SHX166

## 2019-05-21 LAB — BASIC METABOLIC PANEL
Anion gap: 16 — ABNORMAL HIGH (ref 5–15)
BUN: 52 mg/dL — ABNORMAL HIGH (ref 6–20)
CO2: 26 mmol/L (ref 22–32)
Calcium: 8.5 mg/dL — ABNORMAL LOW (ref 8.9–10.3)
Chloride: 93 mmol/L — ABNORMAL LOW (ref 98–111)
Creatinine, Ser: 6.74 mg/dL — ABNORMAL HIGH (ref 0.44–1.00)
GFR calc Af Amer: 8 mL/min — ABNORMAL LOW (ref >60)
GFR calc non Af Amer: 7 mL/min — ABNORMAL LOW (ref >60)
Glucose, Bld: 99 mg/dL (ref 70–99)
Potassium: 4.2 mmol/L (ref 3.5–5.1)
Sodium: 135 mmol/L (ref 135–145)

## 2019-05-21 LAB — HEMOGLOBIN A1C
Hgb A1c MFr Bld: 6.9 % — ABNORMAL HIGH (ref 4.8–5.6)
Mean Plasma Glucose: 151.33 mg/dL

## 2019-05-21 LAB — GLUCOSE, CAPILLARY
Glucose-Capillary: 89 mg/dL (ref 70–99)
Glucose-Capillary: 89 mg/dL (ref 70–99)
Glucose-Capillary: 75 mg/dL (ref 70–99)
Glucose-Capillary: 92 mg/dL (ref 70–99)
Glucose-Capillary: 83 mg/dL (ref 70–99)
Glucose-Capillary: 131 mg/dL — ABNORMAL HIGH (ref 70–99)
Glucose-Capillary: 135 mg/dL — ABNORMAL HIGH (ref 70–99)
Glucose-Capillary: 131 mg/dL — ABNORMAL HIGH (ref 70–99)
Glucose-Capillary: 137 mg/dL — ABNORMAL HIGH (ref 70–99)

## 2019-05-21 LAB — CBC
HCT: 35.7 % — ABNORMAL LOW (ref 36.0–46.0)
Hemoglobin: 13.1 g/dL (ref 12.0–15.0)
MCH: 32.9 pg (ref 26.0–34.0)
MCHC: 36.7 g/dL — ABNORMAL HIGH (ref 30.0–36.0)
MCV: 89.7 fL (ref 80.0–100.0)
Platelets: 250 10*3/uL (ref 150–400)
RBC: 3.98 MIL/uL (ref 3.87–5.11)
RDW: 16.4 % — ABNORMAL HIGH (ref 11.5–15.5)
WBC: 16.1 10*3/uL — ABNORMAL HIGH (ref 4.0–10.5)
nRBC: 0 % (ref 0.0–0.2)

## 2019-05-21 LAB — SARS CORONAVIRUS 2 BY RT PCR (HOSPITAL ORDER, PERFORMED IN ~~LOC~~ HOSPITAL LAB): SARS Coronavirus 2: NEGATIVE

## 2019-05-21 LAB — CREATININE, SERUM
Creatinine, Ser: 6.96 mg/dL — ABNORMAL HIGH (ref 0.44–1.00)
GFR calc Af Amer: 7 mL/min — ABNORMAL LOW (ref >60)
GFR calc non Af Amer: 6 mL/min — ABNORMAL LOW (ref >60)

## 2019-05-21 LAB — POCT I-STAT 4, (NA,K, GLUC, HGB,HCT)
Glucose, Bld: 96 mg/dL (ref 70–99)
HCT: 38 % (ref 36.0–46.0)
Hemoglobin: 12.9 g/dL (ref 12.0–15.0)
Potassium: 4.1 mmol/L (ref 3.5–5.1)
Sodium: 135 mmol/L (ref 135–145)

## 2019-05-21 SURGERY — AMPUTATION, ABOVE KNEE
Anesthesia: General | Laterality: Left

## 2019-05-21 MED ORDER — CHLORHEXIDINE GLUCONATE CLOTH 2 % EX PADS: 6.0000 | MEDICATED_PAD | Freq: Every day | CUTANEOUS | Status: DC

## 2019-05-21 MED ORDER — TOPIRAMATE 25 MG PO TABS
50.0000 mg | ORAL_TABLET | Freq: Every day | ORAL | Status: DC | PRN
  Administered 2019-05-21: 17:00:00 50 mg via ORAL
  Filled 2019-05-21: qty 2

## 2019-05-21 MED ORDER — LEVOTHYROXINE SODIUM 100 MCG PO TABS
200.0000 ug | ORAL_TABLET | Freq: Every day | ORAL | Status: DC
  Administered 2019-05-23: 200 ug via ORAL
  Filled 2019-05-21: qty 2

## 2019-05-21 MED ORDER — METOPROLOL SUCCINATE ER 100 MG PO TB24
100.0000 mg | ORAL_TABLET | Freq: Two times a day (BID) | ORAL | Status: DC
  Administered 2019-05-21 – 2019-05-23 (×4): 100 mg via ORAL
  Filled 2019-05-21 (×4): qty 1

## 2019-05-21 MED ORDER — ACETAMINOPHEN 325 MG RE SUPP: 325.0000 mg | RECTAL | Status: DC | PRN

## 2019-05-21 MED ORDER — ROSUVASTATIN CALCIUM 5 MG PO TABS
10.0000 mg | ORAL_TABLET | Freq: Every evening | ORAL | Status: DC
  Administered 2019-05-21 – 2019-05-22 (×2): 10 mg via ORAL
  Filled 2019-05-21 (×2): qty 2

## 2019-05-21 MED ORDER — CALCIUM ACETATE (PHOS BINDER) 667 MG PO CAPS: 1334.0000 mg | ORAL_CAPSULE | Freq: Three times a day (TID) | ORAL | Status: DC | PRN

## 2019-05-21 MED ORDER — HYDROMORPHONE HCL 1 MG/ML IJ SOLN
0.5000 mg | INTRAMUSCULAR | Status: DC | PRN
  Administered 2019-05-21 – 2019-05-22 (×3): 0.5 mg via INTRAVENOUS
  Filled 2019-05-21 (×2): qty 1

## 2019-05-21 MED ORDER — PROPOFOL 10 MG/ML IV BOLUS
INTRAVENOUS | Status: DC | PRN
  Administered 2019-05-21: 130 mg via INTRAVENOUS

## 2019-05-21 MED ORDER — PHENYLEPHRINE 40 MCG/ML (10ML) SYRINGE FOR IV PUSH (FOR BLOOD PRESSURE SUPPORT)
PREFILLED_SYRINGE | INTRAVENOUS | Status: DC | PRN
  Administered 2019-05-21: 80 ug via INTRAVENOUS
  Administered 2019-05-21: 120 ug via INTRAVENOUS

## 2019-05-21 MED ORDER — DEXAMETHASONE SODIUM PHOSPHATE 4 MG/ML IJ SOLN
INTRAMUSCULAR | Status: DC | PRN
  Administered 2019-05-21: 4 mg via INTRAVENOUS

## 2019-05-21 MED ORDER — FENTANYL CITRATE (PF) 100 MCG/2ML IJ SOLN
INTRAMUSCULAR | Status: AC
  Filled 2019-05-21: qty 2

## 2019-05-21 MED ORDER — FENTANYL CITRATE (PF) 100 MCG/2ML IJ SOLN
50.0000 ug | Freq: Once | INTRAMUSCULAR | Status: AC
  Administered 2019-05-21: 09:00:00 50 ug via INTRAVENOUS
  Filled 2019-05-21: qty 2

## 2019-05-21 MED ORDER — ROCURONIUM BROMIDE 50 MG/5ML IV SOSY
PREFILLED_SYRINGE | INTRAVENOUS | Status: DC | PRN
  Administered 2019-05-21: 40 mg via INTRAVENOUS

## 2019-05-21 MED ORDER — HYDRALAZINE HCL 20 MG/ML IJ SOLN: 5.0000 mg | INTRAMUSCULAR | Status: DC | PRN

## 2019-05-21 MED ORDER — BACITRACIN ZINC 500 UNIT/GM EX OINT
TOPICAL_OINTMENT | CUTANEOUS | Status: AC
  Filled 2019-05-21: qty 28.35

## 2019-05-21 MED ORDER — INSULIN ASPART 100 UNIT/ML ~~LOC~~ SOLN: 12.0000 [IU] | Freq: Three times a day (TID) | SUBCUTANEOUS | Status: DC

## 2019-05-21 MED ORDER — GABAPENTIN 100 MG PO CAPS
100.0000 mg | ORAL_CAPSULE | Freq: Three times a day (TID) | ORAL | Status: DC | PRN
  Administered 2019-05-21: 15:00:00 100 mg via ORAL
  Administered 2019-05-22: 200 mg via ORAL
  Filled 2019-05-21: qty 2
  Filled 2019-05-21: qty 1

## 2019-05-21 MED ORDER — METOPROLOL TARTRATE 5 MG/5ML IV SOLN: 2.0000 mg | INTRAVENOUS | Status: DC | PRN

## 2019-05-21 MED ORDER — ROCURONIUM BROMIDE 10 MG/ML (PF) SYRINGE
PREFILLED_SYRINGE | INTRAVENOUS | Status: AC
  Filled 2019-05-21: qty 10

## 2019-05-21 MED ORDER — MIDAZOLAM HCL 2 MG/2ML IJ SOLN
INTRAMUSCULAR | Status: AC
  Filled 2019-05-21: qty 2

## 2019-05-21 MED ORDER — LABETALOL HCL 5 MG/ML IV SOLN: 10.0000 mg | INTRAVENOUS | Status: DC | PRN

## 2019-05-21 MED ORDER — DIALYVITE 3000 3 MG PO TABS: 1.0000 | ORAL_TABLET | Freq: Every day | ORAL | Status: DC

## 2019-05-21 MED ORDER — GLYCOPYRROLATE PF 0.2 MG/ML IJ SOSY
PREFILLED_SYRINGE | INTRAMUSCULAR | Status: DC | PRN
  Administered 2019-05-21: .5 mg via INTRAVENOUS

## 2019-05-21 MED ORDER — DOCUSATE SODIUM 100 MG PO CAPS
100.0000 mg | ORAL_CAPSULE | Freq: Every day | ORAL | Status: DC
  Administered 2019-05-22 – 2019-05-23 (×2): 100 mg via ORAL
  Filled 2019-05-21 (×2): qty 1

## 2019-05-21 MED ORDER — HYDROMORPHONE HCL 1 MG/ML IJ SOLN
0.2500 mg | INTRAMUSCULAR | Status: DC | PRN
  Administered 2019-05-21 (×2): 0.5 mg via INTRAVENOUS

## 2019-05-21 MED ORDER — MIDAZOLAM HCL 5 MG/5ML IJ SOLN
INTRAMUSCULAR | Status: DC | PRN
  Administered 2019-05-21: 2 mg via INTRAVENOUS

## 2019-05-21 MED ORDER — FENTANYL CITRATE (PF) 100 MCG/2ML IJ SOLN
INTRAMUSCULAR | Status: DC | PRN
  Administered 2019-05-21 (×3): 50 ug via INTRAVENOUS

## 2019-05-21 MED ORDER — FENTANYL CITRATE (PF) 250 MCG/5ML IJ SOLN
INTRAMUSCULAR | Status: AC
  Filled 2019-05-21: qty 5

## 2019-05-21 MED ORDER — KETAMINE HCL 50 MG/5ML IJ SOSY
PREFILLED_SYRINGE | INTRAMUSCULAR | Status: AC
  Filled 2019-05-21: qty 5

## 2019-05-21 MED ORDER — BACITRACIN ZINC 500 UNIT/GM EX OINT
TOPICAL_OINTMENT | CUTANEOUS | Status: DC | PRN
  Administered 2019-05-21: 1 via TOPICAL

## 2019-05-21 MED ORDER — RENA-VITE PO TABS
1.0000 | ORAL_TABLET | Freq: Every day | ORAL | Status: DC
  Administered 2019-05-21 – 2019-05-22 (×2): 1 via ORAL
  Filled 2019-05-21 (×2): qty 1

## 2019-05-21 MED ORDER — CHLORHEXIDINE GLUCONATE 4 % EX LIQD: 60.0000 mL | Freq: Once | CUTANEOUS | Status: DC

## 2019-05-21 MED ORDER — FUROSEMIDE 80 MG PO TABS
80.0000 mg | ORAL_TABLET | Freq: Two times a day (BID) | ORAL | Status: DC
  Administered 2019-05-21 – 2019-05-23 (×3): 80 mg via ORAL
  Filled 2019-05-21 (×3): qty 1

## 2019-05-21 MED ORDER — PHENOL 1.4 % MT LIQD: 1.0000 | OROMUCOSAL | Status: DC | PRN

## 2019-05-21 MED ORDER — PANTOPRAZOLE SODIUM 40 MG PO TBEC: 40.0000 mg | DELAYED_RELEASE_TABLET | Freq: Every day | ORAL | Status: DC

## 2019-05-21 MED ORDER — ASPIRIN EC 81 MG PO TBEC
81.0000 mg | DELAYED_RELEASE_TABLET | Freq: Two times a day (BID) | ORAL | Status: DC
  Administered 2019-05-21 – 2019-05-23 (×4): 81 mg via ORAL
  Filled 2019-05-21 (×4): qty 1

## 2019-05-21 MED ORDER — KETAMINE HCL 10 MG/ML IJ SOLN
INTRAMUSCULAR | Status: DC | PRN
  Administered 2019-05-21: 20 mg via INTRAVENOUS

## 2019-05-21 MED ORDER — DEXAMETHASONE SODIUM PHOSPHATE 10 MG/ML IJ SOLN
INTRAMUSCULAR | Status: AC
  Filled 2019-05-21: qty 1

## 2019-05-21 MED ORDER — SODIUM CHLORIDE 0.9 % IV SOLN
INTRAVENOUS | Status: DC
  Administered 2019-05-21: 09:00:00 via INTRAVENOUS

## 2019-05-21 MED ORDER — POLYETHYLENE GLYCOL 3350 17 G PO PACK: 17.0000 g | PACK | Freq: Every day | ORAL | Status: DC | PRN

## 2019-05-21 MED ORDER — ONDANSETRON HCL 4 MG/2ML IJ SOLN
INTRAMUSCULAR | Status: DC | PRN
  Administered 2019-05-21: 4 mg via INTRAVENOUS

## 2019-05-21 MED ORDER — FENTANYL CITRATE (PF) 100 MCG/2ML IJ SOLN
25.0000 ug | INTRAMUSCULAR | Status: DC | PRN
  Administered 2019-05-21 (×3): 50 ug via INTRAVENOUS

## 2019-05-21 MED ORDER — INSULIN LISPRO 100 UNIT/ML ~~LOC~~ SOLN: 12.0000 [IU] | Freq: Three times a day (TID) | SUBCUTANEOUS | Status: DC | PRN

## 2019-05-21 MED ORDER — PROMETHAZINE HCL 25 MG/ML IJ SOLN: 6.2500 mg | INTRAMUSCULAR | Status: DC | PRN

## 2019-05-21 MED ORDER — GUAIFENESIN-DM 100-10 MG/5ML PO SYRP: 15.0000 mL | ORAL_SOLUTION | ORAL | Status: DC | PRN

## 2019-05-21 MED ORDER — CLOPIDOGREL BISULFATE 75 MG PO TABS
75.0000 mg | ORAL_TABLET | Freq: Every evening | ORAL | Status: DC
  Administered 2019-05-22: 19:00:00 75 mg via ORAL
  Filled 2019-05-21: qty 1

## 2019-05-21 MED ORDER — CEFAZOLIN SODIUM-DEXTROSE 2-4 GM/100ML-% IV SOLN
2.0000 g | Freq: Three times a day (TID) | INTRAVENOUS | Status: AC
  Administered 2019-05-21 – 2019-05-22 (×2): 2 g via INTRAVENOUS
  Filled 2019-05-21 (×2): qty 100

## 2019-05-21 MED ORDER — CALCIUM ACETATE (PHOS BINDER) 667 MG PO CAPS: 1334.0000 mg | ORAL_CAPSULE | ORAL | Status: DC

## 2019-05-21 MED ORDER — ISOSORBIDE MONONITRATE ER 60 MG PO TB24
90.0000 mg | ORAL_TABLET | Freq: Every evening | ORAL | Status: DC
  Administered 2019-05-21 – 2019-05-22 (×2): 90 mg via ORAL
  Filled 2019-05-21 (×2): qty 1

## 2019-05-21 MED ORDER — PROPOFOL 10 MG/ML IV BOLUS
INTRAVENOUS | Status: AC
  Filled 2019-05-21: qty 20

## 2019-05-21 MED ORDER — CEFAZOLIN SODIUM-DEXTROSE 2-4 GM/100ML-% IV SOLN
INTRAVENOUS | Status: AC
  Filled 2019-05-21: qty 100

## 2019-05-21 MED ORDER — NEPHRO-VITE 0.8 MG PO TABS
1.0000 | ORAL_TABLET | Freq: Every day | ORAL | Status: DC
  Filled 2019-05-21: qty 1

## 2019-05-21 MED ORDER — FAMOTIDINE 20 MG PO TABS
40.0000 mg | ORAL_TABLET | Freq: Every evening | ORAL | Status: DC
  Administered 2019-05-21: 40 mg via ORAL
  Filled 2019-05-21: qty 2

## 2019-05-21 MED ORDER — CEFAZOLIN SODIUM-DEXTROSE 2-4 GM/100ML-% IV SOLN
2.0000 g | INTRAVENOUS | Status: AC
  Administered 2019-05-21: 2 g via INTRAVENOUS

## 2019-05-21 MED ORDER — ACETAMINOPHEN 325 MG PO TABS
325.0000 mg | ORAL_TABLET | ORAL | Status: DC | PRN
  Administered 2019-05-21: 650 mg via ORAL
  Filled 2019-05-21: qty 2

## 2019-05-21 MED ORDER — LIDOCAINE-PRILOCAINE 2.5-2.5 % EX CREA: 1.0000 "application " | TOPICAL_CREAM | CUTANEOUS | Status: DC | PRN

## 2019-05-21 MED ORDER — FAMOTIDINE 20 MG PO TABS
20.0000 mg | ORAL_TABLET | Freq: Every evening | ORAL | Status: DC
  Administered 2019-05-22: 20 mg via ORAL
  Filled 2019-05-21: qty 1

## 2019-05-21 MED ORDER — CALCIUM ACETATE (PHOS BINDER) 667 MG PO CAPS
2668.0000 mg | ORAL_CAPSULE | Freq: Three times a day (TID) | ORAL | Status: DC
  Administered 2019-05-22 (×2): 2668 mg via ORAL
  Filled 2019-05-21 (×4): qty 4

## 2019-05-21 MED ORDER — NEOSTIGMINE METHYLSULFATE 3 MG/3ML IV SOSY
PREFILLED_SYRINGE | INTRAVENOUS | Status: DC | PRN
  Administered 2019-05-21: 4 mg via INTRAVENOUS

## 2019-05-21 MED ORDER — 0.9 % SODIUM CHLORIDE (POUR BTL) OPTIME
TOPICAL | Status: DC | PRN
  Administered 2019-05-21: 1000 mL

## 2019-05-21 MED ORDER — HYDROMORPHONE HCL 1 MG/ML IJ SOLN
INTRAMUSCULAR | Status: AC
  Administered 2019-05-22: 0.5 mg via INTRAVENOUS
  Filled 2019-05-21: qty 1

## 2019-05-21 MED ORDER — HEPARIN SODIUM (PORCINE) 5000 UNIT/ML IJ SOLN
5000.0000 [IU] | Freq: Three times a day (TID) | INTRAMUSCULAR | Status: DC
  Administered 2019-05-22 – 2019-05-23 (×3): 5000 [IU] via SUBCUTANEOUS
  Filled 2019-05-21 (×3): qty 1

## 2019-05-21 MED ORDER — ONDANSETRON HCL 4 MG/2ML IJ SOLN: 4.0000 mg | Freq: Four times a day (QID) | INTRAMUSCULAR | Status: DC | PRN

## 2019-05-21 MED ORDER — LIDOCAINE HCL (CARDIAC) PF 100 MG/5ML IV SOSY
PREFILLED_SYRINGE | INTRAVENOUS | Status: DC | PRN
  Administered 2019-05-21: 80 mg via INTRAVENOUS

## 2019-05-21 MED ORDER — OXYCODONE HCL 5 MG PO TABS
10.0000 mg | ORAL_TABLET | Freq: Four times a day (QID) | ORAL | Status: DC | PRN
  Administered 2019-05-21 – 2019-05-23 (×4): 10 mg via ORAL
  Filled 2019-05-21 (×3): qty 2

## 2019-05-21 MED ORDER — INSULIN ASPART 100 UNIT/ML ~~LOC~~ SOLN
0.0000 [IU] | Freq: Three times a day (TID) | SUBCUTANEOUS | Status: DC
  Administered 2019-05-21: 1 [IU] via SUBCUTANEOUS

## 2019-05-21 SURGICAL SUPPLY — 60 items
BANDAGE ELASTIC 4 VELCRO ST LF (GAUZE/BANDAGES/DRESSINGS) ×3 IMPLANT
BANDAGE ELASTIC 6 VELCRO ST LF (GAUZE/BANDAGES/DRESSINGS) ×3 IMPLANT
BANDAGE ESMARK 6X9 LF (GAUZE/BANDAGES/DRESSINGS) ×1 IMPLANT
BLADE SAGITTAL (BLADE)
BLADE SAGITTAL 25.0X1.19X90 (BLADE) IMPLANT
BLADE SAGITTAL 25.0X1.19X90MM (BLADE)
BLADE SAW GIGLI 510 (BLADE) ×2 IMPLANT
BLADE SAW GIGLI 510MM (BLADE) ×1
BLADE SAW THK.89X75X18XSGTL (BLADE) IMPLANT
BNDG COHESIVE 6X5 TAN STRL LF (GAUZE/BANDAGES/DRESSINGS) ×3 IMPLANT
BNDG ELASTIC 4X5.8 VLCR STR LF (GAUZE/BANDAGES/DRESSINGS) ×3 IMPLANT
BNDG ELASTIC 6X5.8 VLCR STR LF (GAUZE/BANDAGES/DRESSINGS) ×3 IMPLANT
BNDG ESMARK 6X9 LF (GAUZE/BANDAGES/DRESSINGS) ×3
BNDG GAUZE ELAST 4 BULKY (GAUZE/BANDAGES/DRESSINGS) ×3 IMPLANT
CANISTER SUCT 3000ML PPV (MISCELLANEOUS) ×3 IMPLANT
CLIP VESOCCLUDE MED 6/CT (CLIP) ×3 IMPLANT
COVER SURGICAL LIGHT HANDLE (MISCELLANEOUS) ×3 IMPLANT
COVER WAND RF STERILE (DRAPES) ×3 IMPLANT
CUFF TOURN SGL QUICK 24 (TOURNIQUET CUFF)
CUFF TOURN SGL QUICK 34 (TOURNIQUET CUFF)
CUFF TRNQT CYL 24X4X16.5-23 (TOURNIQUET CUFF) IMPLANT
CUFF TRNQT CYL 34X4.125X (TOURNIQUET CUFF) IMPLANT
DRAIN CHANNEL 19F RND (DRAIN) IMPLANT
DRAPE HALF SHEET 40X57 (DRAPES) ×3 IMPLANT
DRAPE ORTHO SPLIT 77X108 STRL (DRAPES) ×4
DRAPE SURG ORHT 6 SPLT 77X108 (DRAPES) ×2 IMPLANT
DRSG ADAPTIC 3X8 NADH LF (GAUZE/BANDAGES/DRESSINGS) ×3 IMPLANT
ELECT CAUTERY BLADE 6.4 (BLADE) ×3 IMPLANT
ELECT REM PT RETURN 9FT ADLT (ELECTROSURGICAL) ×3
ELECTRODE REM PT RTRN 9FT ADLT (ELECTROSURGICAL) ×1 IMPLANT
EVACUATOR SILICONE 100CC (DRAIN) IMPLANT
GAUZE SPONGE 4X4 12PLY STRL (GAUZE/BANDAGES/DRESSINGS) ×3 IMPLANT
GAUZE SPONGE 4X4 12PLY STRL LF (GAUZE/BANDAGES/DRESSINGS) ×3 IMPLANT
GLOVE BIO SURGEON STRL SZ 6.5 (GLOVE) ×2 IMPLANT
GLOVE BIO SURGEON STRL SZ7.5 (GLOVE) ×3 IMPLANT
GLOVE BIO SURGEONS STRL SZ 6.5 (GLOVE) ×1
GLOVE BIOGEL M 6.5 STRL (GLOVE) ×6 IMPLANT
GLOVE BIOGEL PI IND STRL 7.0 (GLOVE) ×3 IMPLANT
GLOVE BIOGEL PI INDICATOR 7.0 (GLOVE) ×6
GOWN STRL REUS W/ TWL LRG LVL3 (GOWN DISPOSABLE) ×3 IMPLANT
GOWN STRL REUS W/ TWL XL LVL3 (GOWN DISPOSABLE) ×1 IMPLANT
GOWN STRL REUS W/TWL LRG LVL3 (GOWN DISPOSABLE) ×6
GOWN STRL REUS W/TWL XL LVL3 (GOWN DISPOSABLE) ×2
KIT BASIN OR (CUSTOM PROCEDURE TRAY) ×3 IMPLANT
KIT TURNOVER KIT B (KITS) ×3 IMPLANT
NS IRRIG 1000ML POUR BTL (IV SOLUTION) ×3 IMPLANT
PACK GENERAL/GYN (CUSTOM PROCEDURE TRAY) ×3 IMPLANT
PAD ARMBOARD 7.5X6 YLW CONV (MISCELLANEOUS) ×6 IMPLANT
STAPLER VISISTAT 35W (STAPLE) ×3 IMPLANT
STOCKINETTE IMPERVIOUS LG (DRAPES) ×3 IMPLANT
SUT ETHILON 3 0 PS 1 (SUTURE) IMPLANT
SUT SILK 0 TIES 10X30 (SUTURE) ×3 IMPLANT
SUT SILK 2 0 (SUTURE) ×2
SUT SILK 2-0 18XBRD TIE 12 (SUTURE) ×1 IMPLANT
SUT SILK 3 0 (SUTURE)
SUT SILK 3-0 18XBRD TIE 12 (SUTURE) IMPLANT
SUT VIC AB 2-0 CT1 18 (SUTURE) ×9 IMPLANT
TOWEL GREEN STERILE (TOWEL DISPOSABLE) ×6 IMPLANT
UNDERPAD 30X30 (UNDERPADS AND DIAPERS) ×3 IMPLANT
WATER STERILE IRR 1000ML POUR (IV SOLUTION) ×3 IMPLANT

## 2019-05-21 NOTE — Progress Notes (Signed)
Main lab called and informed nurse that CBC tube did not have enough blood in tube.  Dr. Gifford Shave notified and stated that it would be taken care of in the operating room.  Luciana Axe, CRNA made aware.

## 2019-05-21 NOTE — Anesthesia Procedure Notes (Signed)
Procedure Name: Intubation Performed by: Jenne Campus, CRNA Pre-anesthesia Checklist: Patient identified, Emergency Drugs available, Suction available and Patient being monitored Patient Re-evaluated:Patient Re-evaluated prior to induction Oxygen Delivery Method: Circle System Utilized Preoxygenation: Pre-oxygenation with 100% oxygen Induction Type: IV induction Ventilation: Mask ventilation without difficulty Laryngoscope Size: Mac and 3 Grade View: Grade I Tube type: Oral Number of attempts: 1 Airway Equipment and Method: Stylet and Oral airway Placement Confirmation: ETT inserted through vocal cords under direct vision,  positive ETCO2 and breath sounds checked- equal and bilateral Tube secured with: Tape Dental Injury: Teeth and Oropharynx as per pre-operative assessment

## 2019-05-21 NOTE — Transfer of Care (Signed)
Immediate Anesthesia Transfer of Care Note  Patient: Angela Soto  Procedure(s) Performed: AMPUTATION ABOVE LEFT KNEE (Left )  Patient Location: PACU  Anesthesia Type:General  Level of Consciousness: oriented, drowsy and patient cooperative  Airway & Oxygen Therapy: Patient Spontanous Breathing and Patient connected to face mask oxygen  Post-op Assessment: Report given to RN and Post -op Vital signs reviewed and stable  Post vital signs: Reviewed  Last Vitals:  Vitals Value Taken Time  BP 125/56 05/21/19 1300  Temp    Pulse 54 05/21/19 1302  Resp 19 05/21/19 1302  SpO2 100 % 05/21/19 1302  Vitals shown include unvalidated device data.  Last Pain:  Vitals:   05/21/19 0930  TempSrc:   PainSc: 6       Patients Stated Pain Goal: 3 (64/35/39 1225)  Complications: No apparent anesthesia complications

## 2019-05-21 NOTE — Op Note (Signed)
    Patient name: Angela Soto MRN: [id number] DOB: 02-20-1971 Sex: female  05/21/2019 Pre-operative Diagnosis: Necrotic left below-knee amputation Post-operative diagnosis:  Same Surgeon:  Erlene Quan C. Donzetta Matters, MD Assistant: Leontine Locket, PA Procedure Performed:  Left above-knee amputation  Indications: 48 year old female history of bilateral knee amputations.  Recently had debridement of a left below-knee amputation site that has failed to heal.  She has significant pain is indicated for conversion to left above-knee amputation.  Findings: SFA and femoral vein were occluded on exploration.  There was adequate bleeding from the muscle and resolved reactive to cautery to expect healing.   Procedure:  The patient was identified in the holding area and taken to the operating room where she is placed supine operative general anesthesia induced.  She was to the prepped draped left lower extremity usual fashion antibiotics were minister and timeout was called.  We began with a fishmouth type incision above the knee.  Initially used 10 blade to dissect through the skin and subcutaneous tissue down to the fascia.  We demonstrated good bleeding both anterior and posteriorly with 10 blade.  We then switched to cautery carried dissection down to the bone.  Periosteum was elevated.  Bone was transected with Gigli and smoothed with rongeur.  Posterior flap was created with cautery.  We irrigated the wound obtain hemostasis.  Flaps were approximated with 2-0 Vicryl suture.  Skin closed with skin clips.  Sterile dressing was placed.  She was awakened anesthesia having tolerated procedure without immediate complication.  EBL: 100cc  Azim Gillingham C. Donzetta Matters, MD [facility] [place] Office: [phone] Pager: [phone]

## 2019-05-21 NOTE — Consult Note (Signed)
Reason for Consult: To manage dialysis and dialysis related needs Referring Physician: Davion Meara is an 48 y.o. female with past medical history significant for diabetes mellitus, hypertension, coronary artery disease, PAD status post bilateral leg amputations, as well as ESRD-on dialysis Monday, Wednesday, Friday at the [facility] dialysis center followed by Dr. Lowanda Foster.  She presents today for a revision of her left BKA to a left AKA secondary to infection.  She is seen in the postoperative area, in and out of somnolence and in pain.  She is expected to stay here for few days.  Tomorrow is her dialysis day and we are asked to provide dialysis during the inpatient.   Dialyzes at [facility] -Monday Wednesday Friday -4 hours and 15 minutes EDW 96.5. HD Bath 1K/2.5 calcium, Dialyzer unknown, Heparin yes. Access left upper arm AV fistula.  Blood flow rate 350, no meds given on dialysis  Past Medical History:  Diagnosis Date  . Coronary artery disease    a. s/p prior PCI in 1998 b. 10/2017: cath showing severe two-vessel CAD with heavy calcification along the LCx and 100% CTO of RCA with left to right collaterals noted. No good options for PCI. Medical management recommended.   . Depression   . Environmental allergies    uses inhalers  . ESRD (end stage renal disease) on dialysis [facility])    "started dialysis on 10/08/13; MWF; [facility]; [place], [place]"  . GERD (gastroesophageal reflux disease)   . YIFOYDXA(128.7)    "weekly" (11/09/2017)  . High cholesterol   . Hypertension   . Hypothyroidism   . Multiple sclerosis ([place])    just diagnosed 2014  . Myocardial infarction ([place]) 1998  . Nonalcoholic steatohepatitis ([facility])   . Peripheral vascular disease ([place])    aortic artery occlusion  . PONV (postoperative nausea and vomiting)   . Thyroid cancer ([place])   . Type 1 diabetes mellitus ([place])   . Umbilical hernia     Past Surgical History:  Procedure Laterality Date  . ABDOMINAL SURGERY   2006   aortic artery occluded, had to "clean it out"  . AMPUTATION Left 09/22/2014   Procedure: AMPUTATION LEFT FOURTH FINGER;  Surgeon: Dayna Barker, MD;  Location: [place];  Service: Plastics;  Laterality: Left;  . AMPUTATION Left 04/25/2019   Procedure: DEBRIDEMENT BELOW KNEE AMPUTATION;  Surgeon: Waynetta Sandy, MD;  Location: [place];  Service: Vascular;  Laterality: Left;  . AV FISTULA PLACEMENT Left 09/24/2013   Procedure: LEFT UPPER EXTREMITY ARTERIOVENOUS (AV) FISTULA CREATION BRACHIAL/CEPHALIC;  Surgeon: Elam Dutch, MD;  Location: [place];  Service: Vascular;  Laterality: Left;  . BASCILIC VEIN TRANSPOSITION Left 02/18/2014   Procedure: BASILIC VEIN TRANSPOSITION - 2ND STAGE LEFT ARM;  Surgeon: Elam Dutch, MD;  Location: [place];  Service: Vascular;  Laterality: Left;  . CARDIAC CATHETERIZATION     "numerous"  . CARDIAC CATHETERIZATION  11/09/2017  . CORONARY ANGIOPLASTY    . CORONARY ANGIOPLASTY WITH STENT PLACEMENT     "think I have a total of 3 stents" (11/09/2017)  . INSERTION OF DIALYSIS CATHETER Right 10/07/2013   Procedure: INSERTION OF DIALYSIS CATHETER;  Surgeon: Conrad Wasta, MD;  Location: [place];  Service: Vascular;  Laterality: Right;  . IRRIGATION AND DEBRIDEMENT ABSCESS Right   . LEFT HEART CATH AND CORONARY ANGIOGRAPHY N/A 11/09/2017   Procedure: LEFT HEART CATH AND CORONARY ANGIOGRAPHY;  Surgeon: Burnell Blanks, MD;  Location: [place] [facility];  Service: Cardiovascular;  Laterality: N/A;  . LEG AMPUTATION BELOW KNEE Bilateral 2008-2010   left-right  . PARATHYROIDECTOMY    . SHUNTOGRAM Left 04/17/2014   Procedure: FISTULOGRAM;  Surgeon: Conrad Wellford, MD;  Location: [facility] CATH LAB;  Service: Cardiovascular;  Laterality: Left;  . THYROIDECTOMY      Family History  Problem Relation Age of Onset  . Diabetes Mellitus II Mother   . Ovarian cancer Mother   . CAD Father 7  . Diabetes Mellitus II Father     Social History:  reports that she has been  smoking cigarettes. She has a 16.00 pack-year smoking history. She has never used smokeless tobacco. She reports that she does not drink alcohol or use drugs.  Allergies:  Allergies  Allergen Reactions  . Metformin And Related Diarrhea and Nausea And Vomiting  . Chantix [Varenicline]     "EVIL DREAMS"    Medications: I have reviewed the patient's current medications.   Results for orders placed or performed during the hospital encounter of 05/21/19 (from the past 48 hour(s))  SARS Coronavirus 2 [facility] order, Performed in [facility] hospital lab) Nasopharyngeal Nasopharyngeal Swab     Status: None   Collection Time: 05/21/19  8:30 AM   Specimen: Nasopharyngeal Swab  Result Value Ref Range   SARS Coronavirus 2 NEGATIVE NEGATIVE    Comment: (NOTE) If result is NEGATIVE SARS-CoV-2 target nucleic acids are NOT DETECTED. The SARS-CoV-2 RNA is generally detectable in upper and lower  respiratory specimens during the acute phase of infection. The lowest  concentration of SARS-CoV-2 viral copies this assay can detect is 250  copies / mL. A negative result does not preclude SARS-CoV-2 infection  and should not be used as the sole basis for treatment or other  patient management decisions.  A negative result may occur with  improper specimen collection / handling, submission of specimen other  than nasopharyngeal swab, presence of viral mutation(s) within the  areas targeted by this assay, and inadequate number of viral copies  (<250 copies / mL). A negative result must be combined with clinical  observations, patient history, and epidemiological information. If result is POSITIVE SARS-CoV-2 target nucleic acids are DETECTED. The SARS-CoV-2 RNA is generally detectable in upper and lower  respiratory specimens dur ing the acute phase of infection.  Positive  results are indicative of active infection with SARS-CoV-2.  Clinical  correlation with patient history and other diagnostic  information is  necessary to determine patient infection status.  Positive results do  not rule out bacterial infection or co-infection with other viruses. If result is PRESUMPTIVE POSTIVE SARS-CoV-2 nucleic acids MAY BE PRESENT.   A presumptive positive result was obtained on the submitted specimen  and confirmed on repeat testing.  While 2019 novel coronavirus  (SARS-CoV-2) nucleic acids may be present in the submitted sample  additional confirmatory testing may be necessary for epidemiological  and / or clinical management purposes  to differentiate between  SARS-CoV-2 and other Sarbecovirus currently known to infect humans.  If clinically indicated additional testing with an alternate test  methodology [phone]) is advised. The SARS-CoV-2 RNA is generally  detectable in upper and lower respiratory sp ecimens during the acute  phase of infection. The expected result is Negative. Fact Sheet for Patients:  StrictlyIdeas.no Fact Sheet for Healthcare Providers: BankingDealers.co.za This test is not yet approved or cleared by the [place] FDA and has been authorized for detection and/or diagnosis of SARS-CoV-2 by FDA under an Emergency Use Authorization (EUA).  This EUA will remain in effect (meaning this test can be used) for the duration of the COVID-19 declaration under Section 564(b)(1) of the Act, 21 U.S.C. section 360bbb-3(b)(1), unless the authorization is terminated or revoked sooner. Performed at [facility], [place] [address], [place], [place] [address]   Glucose, capillary     Status: None   Collection Time: 05/21/19  8:33 AM  Result Value Ref Range   Glucose-Capillary 89 70 - 99 mg/dL  Glucose, capillary     Status: None   Collection Time: 05/21/19  9:33 AM  Result Value Ref Range   Glucose-Capillary 89 70 - 99 mg/dL  Basic metabolic panel     Status: Abnormal   Collection Time: 05/21/19 10:10 AM  Result Value  Ref Range   Sodium 135 135 - 145 mmol/L   Potassium 4.2 3.5 - 5.1 mmol/L   Chloride 93 (L) 98 - 111 mmol/L   CO2 26 22 - 32 mmol/L   Glucose, Bld 99 70 - 99 mg/dL   BUN 52 (H) 6 - 20 mg/dL   Creatinine, Ser 6.74 (H) 0.44 - 1.00 mg/dL   Calcium 8.5 (L) 8.9 - 10.3 mg/dL   GFR calc non Af Amer 7 (L) >60 mL/min   GFR calc Af Amer 8 (L) >60 mL/min   Anion gap 16 (H) 5 - 15    Comment: Performed at [facility], [place] [address], [place], [place] [address]  Glucose, capillary     Status: None   Collection Time: 05/21/19 10:32 AM  Result Value Ref Range   Glucose-Capillary 75 70 - 99 mg/dL   Comment 1 Notify RN    Comment 2 Document in Chart   Glucose, capillary     Status: None   Collection Time: 05/21/19 11:35 AM  Result Value Ref Range   Glucose-Capillary 92 70 - 99 mg/dL  I-STAT 4, (NA,K, GLUC, HGB,HCT)     Status: None   Collection Time: 05/21/19 12:22 PM  Result Value Ref Range   Sodium 135 135 - 145 mmol/L   Potassium 4.1 3.5 - 5.1 mmol/L   Glucose, Bld 96 70 - 99 mg/dL   HCT 38.0 36.0 - 46.0 %   Hemoglobin 12.9 12.0 - 15.0 g/dL  Glucose, capillary     Status: None   Collection Time: 05/21/19  1:07 PM  Result Value Ref Range   Glucose-Capillary 83 70 - 99 mg/dL    No results found.  ROS: Unable to obtain from patient as she is postop Blood pressure (!) 146/66, pulse (!) 58, temperature (!) 97 F (36.1 C), resp. rate 14, height 4\' 3"  (1.295 m), weight 89.8 kg, last menstrual period 05/24/2013, SpO2 97 %. General appearance: moderate distress and slowed mentation Resp: clear to auscultation bilaterally Cardio: regular rate and rhythm, S1, S2 normal, no murmur, click, rub or gallop GI: soft, non-tender; bowel sounds normal; no masses,  no organomegaly Extremities: extremities normal, atraumatic, no cyanosis or edema and Has right BKA and new left AKA Incision/Wound: Left upper arm AV fistula-patent  Assessment/Plan: 48 year old diabetic with diffuse vascular disease  and also ESRD.  Here for revision of left BKA to an AKA 1 status post left AKA-pain control and wound management per VVS 2 ESRD: Normally MWF at [facility].  We will plan for normal routine dialysis tomorrow in-house via AV fistula 3 Hypertension: Home meds include Toprol and Lasix.  Volume control with HD 4. Anemia of ESRD: Not on an ESA  as an outpatient.  Hemoglobin here greater than 12.  Will monitor and treat as needed 5. Metabolic Bone Disease: Not on vitamin D with dialysis.  PhosLo on medication list,  will continue once eating consistently   Louis Meckel 05/21/2019, 2:58 PM

## 2019-05-21 NOTE — Op Note (Signed)
   History and Physical Update  The patient was interviewed and re-examined.  The patient's previous History and Physical has been reviewed and is unchanged from recent office visit. Plan for left above-knee amputation today.  Saleema Weppler C. Donzetta Matters, MD [facility] [place] Office: [phone] Pager: [phone]  05/21/2019, 11:08 AM

## 2019-05-21 NOTE — Anesthesia Preprocedure Evaluation (Signed)
Anesthesia Evaluation  Patient identified by MRN, date of birth, ID band Patient awake    Reviewed: Allergy & Precautions, NPO status , Patient's Chart, lab work & pertinent test results, reviewed documented beta blocker date and time   History of Anesthesia Complications (+) PONV and history of anesthetic complications  Airway Mallampati: II  TM Distance: >3 FB Neck ROM: Full    Dental  (+) Dental Advisory Given, Upper Dentures, Partial Lower   Pulmonary Current Smoker,    Pulmonary exam normal breath sounds clear to auscultation       Cardiovascular hypertension, Pt. on home beta blockers and Pt. on medications + CAD, + Past MI, + Cardiac Stents and + Peripheral Vascular Disease  Normal cardiovascular exam Rhythm:Regular Rate:Normal     Neuro/Psych  Headaches, PSYCHIATRIC DISORDERS Depression Multiple sclerosis    GI/Hepatic Neg liver ROS, GERD  ,  Endo/Other  diabetes, Type 2, Insulin Dependent, Oral Hypoglycemic AgentsHypothyroidism Morbid obesity  Renal/GU ESRF and DialysisRenal diseaseK+ 4.8     Musculoskeletal negative musculoskeletal ROS (+)   Abdominal   Peds  Hematology negative hematology ROS (+)   Anesthesia Other Findings Day of surgery medications reviewed with the patient.  Reproductive/Obstetrics                             Anesthesia Physical  Anesthesia Plan  ASA: IV  Anesthesia Plan: General   Post-op Pain Management:    Induction: Intravenous  PONV Risk Score and Plan: 3 and Midazolam, Dexamethasone and Ondansetron  Airway Management Planned: LMA  Additional Equipment:   Intra-op Plan:   Post-operative Plan: Extubation in OR  Informed Consent: I have reviewed the patients History and Physical, chart, labs and discussed the procedure including the risks, benefits and alternatives for the proposed anesthesia with the patient or authorized representative  who has indicated his/her understanding and acceptance.     Dental advisory given  Plan Discussed with: CRNA  Anesthesia Plan Comments:         Anesthesia Quick Evaluation

## 2019-05-22 ENCOUNTER — Encounter (HOSPITAL_COMMUNITY): Payer: Self-pay | Admitting: Vascular Surgery

## 2019-05-22 LAB — BASIC METABOLIC PANEL
Anion gap: 22 — ABNORMAL HIGH (ref 5–15)
BUN: 61 mg/dL — ABNORMAL HIGH (ref 6–20)
CO2: 20 mmol/L — ABNORMAL LOW (ref 22–32)
Calcium: 8.1 mg/dL — ABNORMAL LOW (ref 8.9–10.3)
Chloride: 92 mmol/L — ABNORMAL LOW (ref 98–111)
Creatinine, Ser: 7.53 mg/dL — ABNORMAL HIGH (ref 0.44–1.00)
GFR calc Af Amer: 7 mL/min — ABNORMAL LOW (ref >60)
GFR calc non Af Amer: 6 mL/min — ABNORMAL LOW (ref >60)
Glucose, Bld: 94 mg/dL (ref 70–99)
Potassium: 6 mmol/L — ABNORMAL HIGH (ref 3.5–5.1)
Sodium: 134 mmol/L — ABNORMAL LOW (ref 135–145)

## 2019-05-22 LAB — CBC
HCT: 33.3 % — ABNORMAL LOW (ref 36.0–46.0)
Hemoglobin: 12.1 g/dL (ref 12.0–15.0)
MCH: 32.5 pg (ref 26.0–34.0)
MCHC: 36.3 g/dL — ABNORMAL HIGH (ref 30.0–36.0)
MCV: 89.5 fL (ref 80.0–100.0)
Platelets: 277 10*3/uL (ref 150–400)
RBC: 3.72 MIL/uL — ABNORMAL LOW (ref 3.87–5.11)
RDW: 15 % (ref 11.5–15.5)
WBC: 13.3 10*3/uL — ABNORMAL HIGH (ref 4.0–10.5)
nRBC: 0 % (ref 0.0–0.2)

## 2019-05-22 LAB — GLUCOSE, CAPILLARY
Glucose-Capillary: 81 mg/dL (ref 70–99)
Glucose-Capillary: 103 mg/dL — ABNORMAL HIGH (ref 70–99)
Glucose-Capillary: 106 mg/dL — ABNORMAL HIGH (ref 70–99)
Glucose-Capillary: 127 mg/dL — ABNORMAL HIGH (ref 70–99)

## 2019-05-22 LAB — POCT I-STAT 4, (NA,K, GLUC, HGB,HCT)
Glucose, Bld: 39 mg/dL — CL (ref 70–99)
HCT: 18 % — ABNORMAL LOW (ref 36.0–46.0)
Hemoglobin: 6.1 g/dL — CL (ref 12.0–15.0)
Potassium: <2 mmol/L — CL (ref 3.5–5.1)
Sodium: 150 mmol/L — ABNORMAL HIGH (ref 135–145)

## 2019-05-22 MED ORDER — HYDROMORPHONE HCL 1 MG/ML IJ SOLN
INTRAMUSCULAR | Status: AC
  Filled 2019-05-22: qty 0.5

## 2019-05-22 MED ORDER — OXYCODONE HCL 5 MG PO TABS
ORAL_TABLET | ORAL | Status: AC
  Administered 2019-05-22: 10 mg via ORAL
  Filled 2019-05-22: qty 2

## 2019-05-22 MED ORDER — HYDROMORPHONE HCL 1 MG/ML IJ SOLN
INTRAMUSCULAR | Status: AC
  Administered 2019-05-22: 0.5 mg via INTRAVENOUS
  Filled 2019-05-22: qty 0.5

## 2019-05-22 MED ORDER — LORAZEPAM 2 MG/ML IJ SOLN
1.0000 mg | Freq: Once | INTRAMUSCULAR | Status: AC
  Administered 2019-05-22: 1 mg via INTRAVENOUS

## 2019-05-22 MED ORDER — LORAZEPAM 2 MG/ML IJ SOLN
INTRAMUSCULAR | Status: AC
  Administered 2019-05-22: 1 mg via INTRAVENOUS
  Filled 2019-05-22: qty 1

## 2019-05-22 NOTE — Progress Notes (Signed)
Subjective:  Seen in HD - having a rough time -  Giving PRNs as able  Objective Vital signs in last 24 hours: Vitals:   05/22/19 0730 05/22/19 0800 05/22/19 0830 05/22/19 0900  BP: 126/85 (!) 159/72 (!) 143/86 119/63  Pulse: 70 70 73 72  Resp:      Temp:      TempSrc:      SpO2:      Weight:      Height:       Weight change:   Intake/Output Summary (Last 24 hours) at 05/22/2019 0925 Last data filed at 05/22/2019 0410 Gross per 24 hour  Intake 432.5 ml  Output 100 ml  Net 332.5 ml   Dialyzes at [facility] -Monday Wednesday Friday -4 hours and 15 minutes EDW 96.5. HD Bath 1K/2.5 calcium, Dialyzer unknown, Heparin yes. Access left upper arm AV fistula.  Blood flow rate 350, no meds given on dialysis   Assessment/Plan: 48 year old diabetic with diffuse vascular disease and also ESRD.  Here for revision of left BKA to an AKA 1 status post left AKA-pain control and wound management per VVS- is a struggle  2 ESRD: Normally MWF at [facility].  We will plan for normal routine dialysis today in-house via AV fistula 3 Hypertension: Home meds include Toprol and Lasix.  Volume control with HD 4. Anemia of ESRD: Not on an ESA as an outpatient.  Hemoglobin here greater than 12.  Will monitor and treat as needed 5. Metabolic Bone Disease: Not on vitamin D with dialysis.  PhosLo on medication list,  will continue once eating consistently     Louis Meckel    Labs: Basic Metabolic Panel: Recent Labs  Lab 05/21/19 1010 05/21/19 1202 05/21/19 1222 05/21/19 1521 05/22/19 0349  NA 135 150* 135  --  134*  K 4.2 <2.0* 4.1  --  6.0*  CL 93*  --   --   --  92*  CO2 26  --   --   --  20*  GLUCOSE 99 39* 96  --  94  BUN 52*  --   --   --  61*  CREATININE 6.74*  --   --  6.96* 7.53*  CALCIUM 8.5*  --   --   --  8.1*   Liver Function Tests: No results for input(s): AST, ALT, ALKPHOS, BILITOT, PROT, ALBUMIN in the last 168 hours. No results for input(s): LIPASE, AMYLASE in the  last 168 hours. No results for input(s): AMMONIA in the last 168 hours. CBC: Recent Labs  Lab 05/21/19 1222 05/21/19 1521 05/22/19 0349  WBC  --  16.1* 13.3*  HGB 12.9 13.1 12.1  HCT 38.0 35.7* 33.3*  MCV  --  89.7 89.5  PLT  --  250 277   Cardiac Enzymes: No results for input(s): CKTOTAL, CKMB, CKMBINDEX, TROPONINI in the last 168 hours. CBG: Recent Labs  Lab 05/21/19 1518 05/21/19 1718 05/21/19 2135 05/21/19 2138 05/22/19 0629  GLUCAP 131* 135* 131* 137* 81    Iron Studies: No results for input(s): IRON, TIBC, TRANSFERRIN, FERRITIN in the last 72 hours. Studies/Results: No results found. Medications: Infusions:   Scheduled Medications: . aspirin EC  81 mg Oral BID  . calcium acetate  2,668 mg Oral TID WC  . Chlorhexidine Gluconate Cloth  6 each Topical Q0600  . clopidogrel  75 mg Oral QPM  . docusate sodium  100 mg Oral Daily  . famotidine  20 mg Oral QPM  .  furosemide  80 mg Oral BID  . heparin  5,000 Units Subcutaneous Q8H  . insulin aspart  0-9 Units Subcutaneous TID WC  . insulin aspart  12 Units Subcutaneous TID WC  . isosorbide mononitrate  90 mg Oral QPM  . levothyroxine  200 mcg Oral QAC breakfast  . metoprolol succinate  100 mg Oral BID  . multivitamin  1 tablet Oral QHS  . rosuvastatin  10 mg Oral QPM    have reviewed scheduled and prn medications.  Physical Exam: General: seen in HD-  Uncomfortable  Heart:RRR Lungs: mostly clear Abdomen: soft, non tender Extremities: min edema- bilat amps Dialysis Access: left upper arm AVF     05/22/2019,9:25 AM  LOS: 1 day

## 2019-05-22 NOTE — Evaluation (Signed)
Physical Therapy Evaluation Patient Details Name: Angela Soto MRN: [id number] DOB: 12/19/1970 Today's Date: 05/22/2019   History of Present Illness  Pt is a 48 y/o female admitted for revision of L BKA to L AKA; PMH: CAD, Depression, Environmental allergies, ESRD on dialysis ([place]), GERD, Hypertension, Multiple sclerosis, MI, PVD, Type 1 diabetes, umbilical hernia.   Clinical Impression   Patient is s/p above surgery resulting in functional limitations due to the deficits listed below (see PT Problem List). PTA patient independent using power chair for mobility, sliding board for transfers, and independent ADLs. Admitted for above and limited by problem list below, including pain, impaired balance and decreased activity tolerance. Highly motivated to get home, though pain significantly limiting activity tolerance on eval; I'm hopeful that as her pain is under control, her functional mobility will improve significantly; Patient will benefit from skilled PT to increase their independence and safety with mobility to allow discharge to the venue listed below.       Follow Up Recommendations Home health PT;Supervision/Assistance - 24 hour(Worth considering CIR if slow to progress; Also worth considering [facility] --  Though I'm not sure if that can work with her CAPS program benefit)    Equipment Recommendations  None recommended by PT    Recommendations for Other Services       Precautions / Restrictions Precautions Precautions: Fall Precaution Comments: L AKA (new), R BKA  Restrictions Weight Bearing Restrictions: Yes LLE Weight Bearing: Non weight bearing Other Position/Activity Restrictions: R LE BKA       Mobility  Bed Mobility Overal bed mobility: Needs Assistance Bed Mobility: Supine to Sit;Sit to Supine     Supine to sit: Max assist;+2 for safety/equipment;+2 for physical assistance;HOB elevated Sit to supine: +2 for physical assistance;+2 for  safety/equipment;HOB elevated;Max assist   General bed mobility comments: patient transitioned to/from EOB with support to L AKA, trunk and scooting hips; signifincantly limited by pain in L AKA   Transfers                 General transfer comment: deferred due to pain   Ambulation/Gait                Stairs            Wheelchair Mobility    Modified Rankin (Stroke Patients Only)       Balance Overall balance assessment: Needs assistance Sitting-balance support: Single extremity supported;No upper extremity supported Sitting balance-Leahy Scale: Fair Sitting balance - Comments: initally reliant on B UE support, but once sitting able to progress to 0-1 hand support with min guard to close supervision                                      Pertinent Vitals/Pain Pain Assessment: Faces Faces Pain Scale: Hurts whole lot Pain Location: L AKA Pain Descriptors / Indicators: Discomfort;Grimacing;Operative site guarding(phantom pains) Pain Intervention(s): Monitored during session    Home Living Family/patient expects to be discharged to:: Private residence Living Arrangements: Spouse/significant other Available Help at Discharge: Family;Available 24 hours/day Type of Home: House Home Access: Ramped entrance     Home Layout: One level Home Equipment: Bedside commode;Tub bench;Wheelchair - power;Other (comment)(transfer board ) Additional Comments: spouse is her caregiver     Prior Function Level of Independence: Independent with assistive device(s)         Comments: pt uses power chair  for mobility, transfers using sliding board and independent ADLs      Hand Dominance        Extremity/Trunk Assessment   Upper Extremity Assessment Upper Extremity Assessment: Defer to OT evaluation    Lower Extremity Assessment Lower Extremity Assessment: RLE deficits/detail;LLE deficits/detail RLE Deficits / Details: BKA; actively moves R hip  and knee; extends knee to approx 3-4 deg shy of full extension LLE Deficits / Details: new BKA>AKA; Pain limiting all aspects of moving LLE; phantom pain present LLE: Unable to fully assess due to pain       Communication   Communication: No difficulties  Cognition Arousal/Alertness: Awake/alert;Lethargic(initally lethargic but improved during session) Behavior During Therapy: Anxious Overall Cognitive Status: Within Functional Limits for tasks assessed                                 General Comments: appears WFL, pt with slow processing but anticipate this is baseline; anxious due to pain       General Comments General comments (skin integrity, edema, etc.): VSS    Exercises     Assessment/Plan    PT Assessment Patient needs continued PT services  PT Problem List Decreased strength;Decreased range of motion;Decreased activity tolerance;Decreased balance;Decreased coordination;Decreased mobility;Pain       PT Treatment Interventions DME instruction;Functional mobility training;Therapeutic activities;Therapeutic exercise;Balance training;Neuromuscular re-education;Patient/family education;Wheelchair mobility training    PT Goals (Current goals can be found in the Care Plan section)  Acute Rehab PT Goals Patient Stated Goal: to go home  PT Goal Formulation: With patient Time For Goal Achievement: 06/05/19 Potential to Achieve Goals: Good    Frequency Min 3X/week   Barriers to discharge        Co-evaluation PT/OT/SLP Co-Evaluation/Treatment: Yes Reason for Co-Treatment: For patient/therapist safety;To address functional/ADL transfers PT goals addressed during session: Mobility/safety with mobility OT goals addressed during session: ADL's and self-care;Strengthening/ROM       AM-PAC PT "6 Clicks" Mobility  Outcome Measure Help needed turning from your back to your side while in a flat bed without using bedrails?: A Little Help needed moving from lying  on your back to sitting on the side of a flat bed without using bedrails?: A Lot Help needed moving to and from a bed to a chair (including a wheelchair)?: A Lot Help needed standing up from a chair using your arms (e.g., wheelchair or bedside chair)?: Total Help needed to walk in hospital room?: Total Help needed climbing 3-5 steps with a railing? : Total 6 Click Score: 10    End of Session   Activity Tolerance: Patient limited by pain Patient left: in bed;with call bell/phone within reach Nurse Communication: Mobility status PT Visit Diagnosis: Other abnormalities of gait and mobility (R26.89);Pain Pain - Right/Left: Left Pain - part of body: Leg    Time: 1308-6578 PT Time Calculation (min) (ACUTE ONLY): 35 min   Charges:   PT Evaluation $PT Eval Moderate Complexity: 1 Mod          Roney Marion, PT  Acute Rehabilitation Services Pager [phone] Office [phone]   Colletta [place] 05/22/2019, 4:51 PM

## 2019-05-22 NOTE — Procedures (Signed)
Patient was seen on dialysis and the procedure was supervised.  BFR 400  Via AVF BP is  119/63.   Patient appears to be tolerating treatment well  Louis Meckel 05/22/2019

## 2019-05-22 NOTE — Progress Notes (Signed)
Inpatient Rehabilitation Admissions Coordinator  Inpatient rehab consult received. Pt just back from hemodialysis. Very sleepy. Doubtful that she will need additional rehab s/p AKA. Has been Bilateral BKA for numerous years. I will follow up after therapy evals.  Danne Baxter, RN, MSN Rehab Admissions Coordinator [phone] 05/22/2019 12:39 PM

## 2019-05-22 NOTE — Evaluation (Signed)
Occupational Therapy Evaluation Patient Details Name: Angela Soto MRN: [id number] DOB: 03-04-1971 Today's Date: 05/22/2019    History of Present Illness Pt is a 48 y/o female admitted for revision of L BKA to L AKA; PMH: CAD, Depression, Environmental allergies, ESRD on dialysis ([place]), GERD, Hypertension, Multiple sclerosis, MI, PVD, Type 1 diabetes, umbilical hernia.    Clinical Impression   PTA patient independent using power chair for mobility, sliding board for transfers, and independent ADLs. Admitted for above and limited by problem list below, including pain, impaired balance and decreased activity tolerance. Patient able to complete UB ADLs with setup assist, LB ADLs with max-total assist, bed mobility with max assist +2 and sit EOB with min guard to close supervision.  Highly limited by pain today, requires increased time and effort for all activities.  She is extremely motivated to dc home with spouses support, and feel she will progress well once pain is better controlled.  Patient will benefit from continued OT services while admitted and after dc at [facility] level in order to maximize independence and safety after dc home.  If patients mobility does not progress may need short term CIR rehab prior to dc home.  Will follow.     Follow Up Recommendations  Home health OT;Supervision/Assistance - 24 hour    Equipment Recommendations  None recommended by OT    Recommendations for Other Services       Precautions / Restrictions Precautions Precautions: Fall Precaution Comments: L AKA (new), R BKA  Restrictions Weight Bearing Restrictions: Yes LLE Weight Bearing: Non weight bearing Other Position/Activity Restrictions: R LE BKA       Mobility Bed Mobility Overal bed mobility: Needs Assistance Bed Mobility: Supine to Sit;Sit to Supine     Supine to sit: Max assist;+2 for safety/equipment;+2 for physical assistance;HOB elevated Sit to supine: +2 for physical assistance;+2 for  safety/equipment;HOB elevated;Max assist   General bed mobility comments: patient transitioned to/from EOB with support to L AKA, trunk and scooting hips; signifincantly limited by pain in L AKA   Transfers                 General transfer comment: deferred due to pain     Balance Overall balance assessment: Needs assistance Sitting-balance support: Single extremity supported;No upper extremity supported Sitting balance-Leahy Scale: Fair Sitting balance - Comments: initally reliant on B UE support, but once sitting able to progress to 0-1 hand support with min guard to close supervision                                    ADL either performed or assessed with clinical judgement   ADL Overall ADL's : Needs assistance/impaired     Grooming: Set up;Sitting   Upper Body Bathing: Set up;Sitting   Lower Body Bathing: Bed level;Minimal assistance   Upper Body Dressing : Set up;Supervision/safety;Sitting   Lower Body Dressing: Moderate assistance;Bed level     Toilet Transfer Details (indicate cue type and reason): deferred due to pain          Functional mobility during ADLs: Maximal assistance;+2 for physical assistance;+2 for safety/equipment(limited to EOB only ) General ADL Comments: pt limited by pain      Vision         Perception     Praxis      Pertinent Vitals/Pain Pain Assessment: Faces Faces Pain Scale: Hurts whole lot Pain Location: L AKA  Pain Descriptors / Indicators: Discomfort;Grimacing;Operative site guarding(phantom pains) Pain Intervention(s): Monitored during session;Repositioned;Limited activity within patient's tolerance     Hand Dominance     Extremity/Trunk Assessment Upper Extremity Assessment Upper Extremity Assessment: Generalized weakness   Lower Extremity Assessment Lower Extremity Assessment: Defer to PT evaluation       Communication Communication Communication: No difficulties   Cognition  Arousal/Alertness: Awake/alert;Lethargic(initally lethargic but improved during session) Behavior During Therapy: Anxious Overall Cognitive Status: Within Functional Limits for tasks assessed                                 General Comments: appears WFL, pt with slow processing but anticipate this is baseline; anxious due to pain    General Comments       Exercises     Shoulder Instructions      Home Living Family/patient expects to be discharged to:: Private residence Living Arrangements: Spouse/significant other Available Help at Discharge: Family;Available 24 hours/day   Home Access: Ramped entrance     Home Layout: One level     Bathroom Shower/Tub: Teacher, early years/pre: Standard     Home Equipment: Bedside commode;Tub bench;Wheelchair - power;Other (comment)(transfer board )   Additional Comments: spouse is her caregiver       Prior Functioning/Environment Level of Independence: Independent with assistive device(s)        Comments: pt uses power chair for mobility, transfers using sliding board and independent ADLs         OT Problem List: Decreased activity tolerance;Impaired balance (sitting and/or standing);Pain      OT Treatment/Interventions: Therapeutic exercise;Self-care/ADL training;DME and/or AE instruction;Therapeutic activities;Patient/family education;Balance training    OT Goals(Current goals can be found in the care plan section) Acute Rehab OT Goals Patient Stated Goal: to go home  OT Goal Formulation: With patient Time For Goal Achievement: 06/05/19 Potential to Achieve Goals: Good  OT Frequency: Min 2X/week   Barriers to D/C:            Co-evaluation PT/OT/SLP Co-Evaluation/Treatment: Yes Reason for Co-Treatment: To address functional/ADL transfers;For patient/therapist safety   OT goals addressed during session: ADL's and self-care;Strengthening/ROM      AM-PAC OT "6 Clicks" Daily Activity      Outcome Measure Help from another person eating meals?: None Help from another person taking care of personal grooming?: A Little Help from another person toileting, which includes using toliet, bedpan, or urinal?: Total Help from another person bathing (including washing, rinsing, drying)?: A Lot Help from another person to put on and taking off regular upper body clothing?: A Little Help from another person to put on and taking off regular lower body clothing?: A Lot 6 Click Score: 15   End of Session Nurse Communication: Mobility status;Precautions  Activity Tolerance: Patient limited by pain Patient left: in bed;with call bell/phone within reach;with bed alarm set  OT Visit Diagnosis: Other abnormalities of gait and mobility (R26.89);Pain Pain - Right/Left: Left Pain - part of body: Leg(residual limb)                Time: 6270-3500 OT Time Calculation (min): 35 min Charges:  OT General Charges $OT Visit: 1 Visit OT Evaluation $OT Eval Moderate Complexity: [place], OT Acute Rehabilitation Services Pager [phone] Office [phone]    Delight Stare 05/22/2019, 4:44 PM

## 2019-05-22 NOTE — Plan of Care (Signed)
Will continue to monitor.

## 2019-05-22 NOTE — Progress Notes (Signed)
PT Cancellation Note  Patient Details Name: Angela Soto MRN: [id number] DOB: 1971-01-12   Cancelled Treatment:    Reason Eval/Treat Not Completed: Patient at procedure or test/unavailable   Currently in HD;   Will follow up later today as time allows;  Otherwise, will follow up for PT tomorrow;   Thank you,  Roney Marion, PT  Acute Rehabilitation Services Pager [phone] Office [phone]      Colletta [place] 05/22/2019, 8:19 AM

## 2019-05-22 NOTE — Progress Notes (Signed)
Inpatient Diabetes Program Recommendations  AACE/ADA: New Consensus Statement on Inpatient Glycemic Control (2015)  Target Ranges:  Prepandial:   less than 140 mg/dL      Peak postprandial:   less than 180 mg/dL (1-2 hours)      Critically ill patients:  140 - 180 mg/dL   Lab Results  Component Value Date   GLUCAP 81 05/22/2019   HGBA1C 6.9 (H) 05/21/2019    Review of Glycemic Control Results for Angela Soto, Angela Soto (MRN [id number]) as of 05/22/2019 11:30  Ref. Range 05/21/2019 17:18 05/21/2019 21:35 05/21/2019 21:38 05/22/2019 06:29  Glucose-Capillary Latest Ref Range: 70 - 99 mg/dL 135 (H) 131 (H) 137 (H) 81   Diabetes history: Type 2 DM Outpatient Diabetes medications: Lantus 31-62 QHS for CBGs>150 mg/dL, Novolog 12 units TID Current orders for Inpatient glycemic control: Novolog 0-9 units TID, Novolog 12 units TID Decadron 4 mg x1 on 8/4  Inpatient Diabetes Program Recommendations:    Given FSBG of 81 mg/dL and completion of HD, would recommend decreasing Novolog to 2 units TID (assuming patient is consuming >50% of meal).   Of note, patient received steroids yesterday and CBGs did not exceed 130 mg/dL. Patient has only received 1 unit of novolog since admission.   Thanks, Bronson Curb, MSN, RNC-OB Diabetes Coordinator [phone] (8a-5p)

## 2019-05-22 NOTE — Anesthesia Postprocedure Evaluation (Signed)
Anesthesia Post Note  Patient: Angela Soto  Procedure(s) Performed: AMPUTATION ABOVE LEFT KNEE (Left )     Anesthesia Post Evaluation  Last Vitals:  Vitals:   05/22/19 1600 05/22/19 2006  BP: (!) 155/68 (!) 161/61  Pulse: 72 72  Resp: 16 16  Temp: 37.3 C 37.2 C  SpO2: 96% 97%    Last Pain:  Vitals:   05/22/19 2009  TempSrc:   PainSc: [place]

## 2019-05-22 NOTE — Progress Notes (Signed)
Pt. Very restless and in pain 10/10 IV dilaudid given as order already. Dose ineffective Dr. Moshe Cipro paged and made aware of pt. Pain and restlessness and anxiety. Dr. Moshe Cipro ok to give ordered oxycodone as ordered and orders given for ativan as documented. Will continue to reasses pt.

## 2019-05-22 NOTE — Progress Notes (Signed)
  Progress Note    05/22/2019 11:33 AM 1 Day Post-Op  Subjective:  Having burning pain in left stump  Vitals:   05/22/19 1030 05/22/19 1100  BP: (!) 115/58 (P) 110/80  Pulse: 75 (P) 75  Resp:  (P) 18  Temp:  (!) (P) 97.5 F (36.4 C)  SpO2:  (P) 98%    Physical Exam: aaox3 Left aka site dressing cdi  CBC    Component Value Date/Time   WBC 13.3 (H) 05/22/2019 0349   RBC 3.72 (L) 05/22/2019 0349   HGB 12.1 05/22/2019 0349   HCT 33.3 (L) 05/22/2019 0349   PLT 277 05/22/2019 0349   MCV 89.5 05/22/2019 0349   MCH 32.5 05/22/2019 0349   MCHC 36.3 (H) 05/22/2019 0349   RDW 15.0 05/22/2019 0349   LYMPHSABS 1.3 07/30/2013 0507   MONOABS 0.5 07/30/2013 0507   EOSABS 0.1 07/30/2013 0507   BASOSABS 0.1 07/30/2013 0507    BMET    Component Value Date/Time   NA 134 (L) 05/22/2019 0349   K 6.0 (H) 05/22/2019 0349   CL 92 (L) 05/22/2019 0349   CO2 20 (L) 05/22/2019 0349   GLUCOSE 94 05/22/2019 0349   BUN 61 (H) 05/22/2019 0349   CREATININE 7.53 (H) 05/22/2019 0349   CALCIUM 8.1 (L) 05/22/2019 0349   GFRNONAA 6 (L) 05/22/2019 0349   GFRAA 7 (L) 05/22/2019 0349    INR    Component Value Date/Time   INR 0.97 11/09/2017 1332     Intake/Output Summary (Last 24 hours) at 05/22/2019 1133 Last data filed at 05/22/2019 0410 Gross per 24 hour  Intake 432.5 ml  Output 100 ml  Net 332.5 ml     Assessment/plan:  48 y.o. female is s/p conversion of left bka to aka. She is on home dose neurontin for nerve pain. Dressing down to evaluate surgical wound tomorrow.  Samy Ryner C. Donzetta Matters, MD [facility] [place] Office: [phone] Pager: [phone]  05/22/2019 11:33 AM

## 2019-05-22 NOTE — Consult Note (Signed)
PV Navigator consult acknowledged and chart reviewed. Working from remote location at this time.  48y/o female admitted yesterday for non viable LLE stump site. Patient has bilateral below the knee amputations and has failed to heal recent debridement of the L BKA site. Patient having significant pain and upon exploration, SFA and femoral veins occluded. She underwent a L above the knee amputation with Dr Donzetta Matters 05/21/2019.  Significant medical history to include CM, CAD, HTN, ESRD on HD, NSTEMI and active smoker.  Disciplines following patient: PT/OT and Diabetes Coordinator with IP rehab consult in place.   Will continue to follow this patient and assess needs as her care transitions.  Thank you for this consult. Cletis Media RN BSN CWS [place] [phone]

## 2019-05-23 LAB — CBC
HCT: 34.3 % — ABNORMAL LOW (ref 36.0–46.0)
Hemoglobin: 11 g/dL — ABNORMAL LOW (ref 12.0–15.0)
MCH: 27.6 pg (ref 26.0–34.0)
MCHC: 32.1 g/dL (ref 30.0–36.0)
MCV: 86 fL (ref 80.0–100.0)
Platelets: 243 10*3/uL (ref 150–400)
RBC: 3.99 MIL/uL (ref 3.87–5.11)
RDW: 14.4 % (ref 11.5–15.5)
WBC: 9.6 10*3/uL (ref 4.0–10.5)
nRBC: 0 % (ref 0.0–0.2)

## 2019-05-23 LAB — BASIC METABOLIC PANEL
Anion gap: 17 — ABNORMAL HIGH (ref 5–15)
BUN: 26 mg/dL — ABNORMAL HIGH (ref 6–20)
CO2: 24 mmol/L (ref 22–32)
Calcium: 9.4 mg/dL (ref 8.9–10.3)
Chloride: 93 mmol/L — ABNORMAL LOW (ref 98–111)
Creatinine, Ser: 4.91 mg/dL — ABNORMAL HIGH (ref 0.44–1.00)
GFR calc Af Amer: 11 mL/min — ABNORMAL LOW (ref >60)
GFR calc non Af Amer: 10 mL/min — ABNORMAL LOW (ref >60)
Glucose, Bld: 103 mg/dL — ABNORMAL HIGH (ref 70–99)
Potassium: 3.6 mmol/L (ref 3.5–5.1)
Sodium: 134 mmol/L — ABNORMAL LOW (ref 135–145)

## 2019-05-23 LAB — GLUCOSE, CAPILLARY
Glucose-Capillary: 97 mg/dL (ref 70–99)
Glucose-Capillary: 96 mg/dL (ref 70–99)

## 2019-05-23 NOTE — Progress Notes (Signed)
Notified OP HD clinic/[facility] of patient's discharge and faxed records in order to provide continuity of care.  Alphonzo Cruise, [place]' Renal Navigator [phone]

## 2019-05-23 NOTE — Care Management Important Message (Signed)
Important Message  Patient Details  Name: NYKIAH MA MRN: [id number] Date of Birth: [birth year]   Medicare Important Message Given:  Yes     Shelda Altes 05/23/2019, 12:10 PM

## 2019-05-23 NOTE — Progress Notes (Signed)
Physical Therapy Treatment Patient Details Name: Angela Soto MRN: [id number] DOB: [birth year] Today's Date: 05/23/2019    History of Present Illness Pt is a 48 y/o female admitted for revision of L BKA to L AKA; PMH: CAD, Depression, Environmental allergies, ESRD on dialysis ([place]), GERD, Hypertension, Multiple sclerosis, MI, PVD, Type 1 diabetes, umbilical hernia.     PT Comments    Patient received sitting on side of bed, attempting to get herself into her wheelchair. Patient frustrated by how difficult this has become. With encouragement, bed height elevated and encouragement patient is able to use sliding board and get into wheelchair with min guarding. She declines attempting to perform transfer from chair back into bed. She is more encouraged after being able to get into her wheelchair. Patient agrees to HHPT at discharge.       Follow Up Recommendations  Home health PT;Supervision/Assistance - 24 hour     Equipment Recommendations  None recommended by PT    Recommendations for Other Services       Precautions / Restrictions Precautions Precautions: Fall Precaution Comments: L AKA (new), R BKA  Restrictions Weight Bearing Restrictions: Yes LLE Weight Bearing: Non weight bearing Other Position/Activity Restrictions: R LE BKA     Mobility  Bed Mobility               General bed mobility comments: patient received sitting edge of bed.  Transfers Overall transfer level: Modified independent Equipment used: Sliding board             General transfer comment: patient able to transfer with min guard, bed height elevated  Ambulation/Gait             General Gait Details: non ambulatory   Stairs             Wheelchair Mobility    Modified Rankin (Stroke Patients Only)       Balance Overall balance assessment: Needs assistance Sitting-balance support: Bilateral upper extremity supported Sitting balance-Leahy Scale: Fair Sitting balance -  Comments: initally reliant on B UE support, but once sitting able to progress to 0-1 hand support with min guard to close supervision                                     Cognition Arousal/Alertness: Awake/alert Behavior During Therapy: WFL for tasks assessed/performed Overall Cognitive Status: Within Functional Limits for tasks assessed                                        Exercises      General Comments        Pertinent Vitals/Pain Pain Assessment: Faces Faces Pain Scale: Hurts little more Pain Location: L AKA Pain Descriptors / Indicators: Discomfort;Sore;Grimacing;Operative site guarding Pain Intervention(s): Monitored during session    Home Living                      Prior Function            PT Goals (current goals can now be found in the care plan section) Acute Rehab PT Goals Patient Stated Goal: to go home  PT Goal Formulation: With patient Time For Goal Achievement: 06/05/19 Potential to Achieve Goals: Good Progress towards PT goals: Progressing toward goals    Frequency    Min  3X/week      PT Plan Current plan remains appropriate    Co-evaluation              AM-PAC PT "6 Clicks" Mobility   Outcome Measure  Help needed turning from your back to your side while in a flat bed without using bedrails?: A Little Help needed moving from lying on your back to sitting on the side of a flat bed without using bedrails?: A Lot Help needed moving to and from a bed to a chair (including a wheelchair)?: A Little Help needed standing up from a chair using your arms (e.g., wheelchair or bedside chair)?: Total Help needed to walk in hospital room?: Total Help needed climbing 3-5 steps with a railing? : Total 6 Click Score: 11    End of Session   Activity Tolerance: Patient tolerated treatment well;Patient limited by pain;Patient limited by fatigue Patient left: in chair;with call bell/phone within reach Nurse  Communication: Mobility status PT Visit Diagnosis: Muscle weakness (generalized) (M62.81);Other abnormalities of gait and mobility (R26.89);Pain Pain - Right/Left: Left Pain - part of body: Leg     Time: 1130-1140 PT Time Calculation (min) (ACUTE ONLY): 10 min  Charges:  $Therapeutic Activity: 8-22 mins                     Landis Cassaro, PT, GCS 05/23/19,1:08 PM

## 2019-05-23 NOTE — Progress Notes (Addendum)
[facility] [place]  Subjective  - Ready to go home, no equipment needs.   Objective (!) 168/68 67 98.4 F (36.9 C) (Oral) 17 97%  Intake/Output Summary (Last 24 hours) at 05/23/2019 0743 Last data filed at 05/22/2019 1700 Gross per 24 hour  Intake 240 ml  Output 2000 ml  Net -1760 ml    Left AKA incision healing well staples intact. Clean dressing replaced. Lungs non labored breathing  Assessment/Planning: POD # 2 left AKA revision from BKA  Incision healing well Plan to discharge home.  No equipment needs.  F/U in 4 weeks for staple removal.  Ordered [employer] AKA stump sock  Roxy Horseman 05/23/2019 7:43 AM --  Laboratory Lab Results: Recent Labs    05/22/19 0349 05/23/19 0245  WBC 13.3* 9.6  HGB 12.1 11.0*  HCT 33.3* 34.3*  PLT 277 243   BMET Recent Labs    05/22/19 0349 05/23/19 0245  NA 134* 134*  K 6.0* 3.6  CL 92* 93*  CO2 20* 24  GLUCOSE 94 103*  BUN 61* 26*  CREATININE 7.53* 4.91*  CALCIUM 8.1* 9.4    COAG Lab Results  Component Value Date   INR 0.97 11/09/2017   INR 1.07 09/22/2014   INR 1.01 07/30/2013   No results found for: PTT   I agree with the above.  I have seen and examined the patient.  She is stable for discharge.  Annamarie Major

## 2019-05-23 NOTE — Discharge Summary (Signed)
[facility] Discharge Summary   Patient ID:  Angela Soto MRN: [id number] DOB/AGE: 48-Apr-1972 48 y.o.  Admit date: 05/21/2019 Discharge date: 05/23/2019 Date of Surgery: 05/21/2019 Surgeon: Surgeon(s): Waynetta Sandy, MD  Admission Diagnosis: nonviable left lower extremity stump  Discharge Diagnoses:  nonviable left lower extremity stump  Secondary Diagnoses: Past Medical History:  Diagnosis Date  . Coronary artery disease    a. s/p prior PCI in 1998 b. 10/2017: cath showing severe two-vessel CAD with heavy calcification along the LCx and 100% CTO of RCA with left to right collaterals noted. No good options for PCI. Medical management recommended.   . Depression   . Environmental allergies    uses inhalers  . ESRD (end stage renal disease) on dialysis [facility])    "started dialysis on 10/08/13; MWF; [facility]; [place], [place]"  . GERD (gastroesophageal reflux disease)   . SHFWYOVZ(858.8)    "weekly" (11/09/2017)  . High cholesterol   . Hypertension   . Hypothyroidism   . Multiple sclerosis ([place])    just diagnosed 2014  . Myocardial infarction ([place]) 1998  . Nonalcoholic steatohepatitis ([facility])   . Peripheral vascular disease ([place])    aortic artery occlusion  . PONV (postoperative nausea and vomiting)   . Thyroid cancer ([place])   . Type 1 diabetes mellitus ([place])   . Umbilical hernia     Procedure(s): AMPUTATION ABOVE LEFT KNEE  Discharged Condition: stable  HPI: Angela Soto is a 48 y.o. female who has a history of bilateral below-knee amputations.  She developed left BKA wound and skin breakdown with sever pain.     She was scheduled for revision BKA to AKA.   Hospital Course:  [name] is a 48 y.o. female is S/P  Procedure(s): AMPUTATION ABOVE LEFT KNEE   Consults:  Treatment Team:  Corliss Parish, MD Serafina Mitchell, MD   She has better pain control post op and the incision is healing well.  Staples intact.  She received HD  M-W-F.  No equipment needs and she has a pain management that supplies her PO pain tablets.  Stable for discharge.  Significant Diagnostic Studies: CBC Lab Results  Component Value Date   WBC 9.6 05/23/2019   HGB 11.0 (L) 05/23/2019   HCT 34.3 (L) 05/23/2019   MCV 86.0 05/23/2019   PLT 243 05/23/2019    BMET    Component Value Date/Time   NA 134 (L) 05/23/2019 0245   K 3.6 05/23/2019 0245   CL 93 (L) 05/23/2019 0245   CO2 24 05/23/2019 0245   GLUCOSE 103 (H) 05/23/2019 0245   BUN 26 (H) 05/23/2019 0245   CREATININE 4.91 (H) 05/23/2019 0245   CALCIUM 9.4 05/23/2019 0245   GFRNONAA 10 (L) 05/23/2019 0245   GFRAA 11 (L) 05/23/2019 0245   COAG Lab Results  Component Value Date   INR 0.97 11/09/2017   INR 1.07 09/22/2014   INR 1.01 07/30/2013     Disposition:  Discharge to :Home Discharge Instructions    Call MD for:  redness, tenderness, or signs of infection (pain, swelling, bleeding, redness, odor or green/yellow discharge around incision site)   Complete by: As directed    Call MD for:  severe or increased pain, loss or decreased feeling  in affected limb(s)   Complete by: As directed    Call MD for:  temperature >100.5   Complete by: As directed    Resume previous diet   Complete by: As directed  Allergies as of 05/23/2019      Reactions   Metformin And Related Diarrhea, Nausea And Vomiting   Chantix [varenicline]    "EVIL DREAMS"      Medication List    TAKE these medications   aspirin EC 81 MG tablet Take 1 tablet (81 mg total) by mouth daily. What changed: when to take this   calcium acetate 667 MG capsule Commonly known as: PHOSLO Take 1,334-2,668 mg by mouth See admin instructions. Take 4 capsules (2668 mg) by mouth with meals and take 2 capsules (1334 mg) by mouth with snacks.   clopidogrel 75 MG tablet Commonly known as: PLAVIX Take 75 mg by mouth every evening.   famotidine 40 MG tablet Commonly known as: PEPCID Take 40 mg by mouth  every evening.   Fish Oil 1000 MG Caps Take 1,000 mg by mouth 2 (two) times a day.   folic acid-vitamin b complex-vitamin c-selenium-zinc 3 MG Tabs tablet Take 1 tablet by mouth daily.   furosemide 80 MG tablet Commonly known as: LASIX Take 80 mg by mouth 2 (two) times a day.   gabapentin 100 MG capsule Commonly known as: NEURONTIN Take 100-200 mg by mouth 3 (three) times daily as needed (pain. (scheduled at night before bedtime)).   insulin glargine 100 UNIT/ML injection Commonly known as: LANTUS Inject 31-62 Units into the skin at bedtime as needed (for blood sugars greater than 150).   insulin lispro 100 UNIT/ML injection Commonly known as: HUMALOG Inject 12 Units into the skin 3 (three) times daily as needed for high blood sugar (blood sugars greater than 130).   isosorbide mononitrate 60 MG 24 hr tablet Commonly known as: IMDUR Take 60 mg in the am, and 30 mg ( 1/2 tablet) in the pm What changed:   how much to take  how to take this  when to take this  additional instructions   levothyroxine 200 MCG tablet Commonly known as: SYNTHROID Take 200 mcg by mouth daily before breakfast.   lidocaine-prilocaine cream Commonly known as: EMLA Apply 1 application topically as needed (for pain/ apply prior to dialysis).   metoprolol succinate 100 MG 24 hr tablet Commonly known as: TOPROL-XL Take 100 mg by mouth 2 (two) times a day.   Oxycodone HCl 10 MG Tabs Take 1 tablet (10 mg total) by mouth every 6 (six) hours as needed (for pain).   rosuvastatin 10 MG tablet Commonly known as: CRESTOR TAKE 1 TABLET BY MOUTH DAILY What changed: when to take this   topiramate 50 MG tablet Commonly known as: TOPAMAX Take 50 mg by mouth daily as needed (headaches.).      Verbal and written Discharge instructions given to the patient. Wound care per Discharge AVS Follow-up Information    Waynetta Sandy, MD Follow up in 4 week(s).   Specialties: Vascular Surgery,  Cardiology Why: office will call Contact information: [place] [address] [phone]          F/U with PA for staple removal in 4 weeks.  Signed: Roxy Horseman 05/23/2019, 8:18 AM

## 2019-05-23 NOTE — Consult Note (Addendum)
   [facility] CM Inpatient Consult   05/23/2019  Angela Soto 03/04/1971 [id number]   Patient screened for less than 30 day readmission. Review of patient's medical record reveals patient is a new left AKA due to gangrene, HX of bilateral BKA. Chart review reveals per MD notes as follows:  HPI: Angela D Beasleyis a 48 y.o.femalewho has ahistory of bilateral below-knee amputations.  She developed left BKA wound and skin breakdown with sever pain.     She was scheduled for revision BKA to AKA. Patient  With ESRD with [facility] MWF schedule noted.  Primary Care Provider is Neale Burly, MD with [facility].    Patient is eligible for [facility] Care Management services.  Plan:  Follow up with inpatient [facility] team for needs.  Call the patient's room and currently no answer.  Will make attempts to speak with patient.  55 Was able to speak with the patient from hospital phone.  Patient verbally consent to [facility] follow up as needed. Will await BHF assessment. Noted that patient will not have the Home First program.  Will assign to [place] Management for follow up.  For questions contact:   Natividad Brood, RN BSN [facility] Liaison  [phone] business mobile phone Toll free office [phone]  Fax number: [phone] [email or web address]

## 2019-05-23 NOTE — Plan of Care (Signed)
Patient discharging home. All goals met/adequate for discharge.

## 2019-05-23 NOTE — Progress Notes (Signed)
Subjective:  HD completed, removed 2000- BP stayed high- d/c summary in chart for today - she looks like a completely different person  Objective Vital signs in last 24 hours: Vitals:   05/22/19 1600 05/22/19 2006 05/22/19 2128 05/23/19 0551  BP: (!) 155/68 (!) 161/61 (!) 164/54 (!) 168/68  Pulse: 72 72 69 67  Resp: 16 16  17   Temp: 99.1 F (37.3 C) 99 F (37.2 C)  98.4 F (36.9 C)  TempSrc: Oral Oral  Oral  SpO2: 96% 97%  97%  Weight:      Height:       Weight change: 3.288 kg  Intake/Output Summary (Last 24 hours) at 05/23/2019 0825 Last data filed at 05/22/2019 1700 Gross per 24 hour  Intake 240 ml  Output 2000 ml  Net -1760 ml   Dialyzes at [facility] -Monday Wednesday Friday -4 hours and 15 minutes EDW 96.5. HD Bath 1K/2.5 calcium, Dialyzer unknown, Heparin yes. Access left upper arm AV fistula.  Blood flow rate 350, no meds given on dialysis   Assessment/Plan: 48 year old diabetic with diffuse vascular disease and also ESRD.  Here for revision of left BKA to an AKA 1 status post left AKA-pain control and wound management per [facility]VS- is a struggle but 100 % better today   2 ESRD: Normally MWF at [facility].  s/p HD yest, she will get at OP unit tomorrow on schedule 3 Hypertension: Home meds include Toprol and Lasix.  Volume control with HD 4. Anemia of ESRD: Not on an ESA as an outpatient.  Hemoglobin here greater than 12-11.  Will monitor and treat as needed 5. Metabolic Bone Disease: Not on vitamin D with dialysis.  PhosLo on medication list, will resume as OP     Louis Meckel    Labs: Basic Metabolic Panel: Recent Labs  Lab 05/21/19 1010  05/21/19 1222 05/21/19 1521 05/22/19 0349 05/23/19 0245  NA 135   < > 135  --  134* 134*  K 4.2   < > 4.1  --  6.0* 3.6  CL 93*  --   --   --  92* 93*  CO2 26  --   --   --  20* 24  GLUCOSE 99   < > 96  --  94 103*  BUN 52*  --   --   --  61* 26*  CREATININE 6.74*  --   --  6.96* 7.53* 4.91*  CALCIUM 8.5*  --    --   --  8.1* 9.4   < > = values in this interval not displayed.   Liver Function Tests: No results for input(s): AST, ALT, ALKPHOS, BILITOT, PROT, ALBUMIN in the last 168 hours. No results for input(s): LIPASE, AMYLASE in the last 168 hours. No results for input(s): AMMONIA in the last 168 hours. CBC: Recent Labs  Lab 05/21/19 1521 05/22/19 0349 05/23/19 0245  WBC 16.1* 13.3* 9.6  HGB 13.1 12.1 11.0*  HCT 35.7* 33.3* 34.3*  MCV 89.7 89.5 86.0  PLT 250 277 243   Cardiac Enzymes: No results for input(s): CKTOTAL, CKMB, CKMBINDEX, TROPONINI in the last 168 hours. CBG: Recent Labs  Lab 05/22/19 0629 05/22/19 1228 05/22/19 1604 05/22/19 2107 05/23/19 0603  GLUCAP 81 103* 106* 127* 97    Iron Studies: No results for input(s): IRON, TIBC, TRANSFERRIN, FERRITIN in the last 72 hours. Studies/Results: No results found. Medications: Infusions:   Scheduled Medications: . aspirin EC  81 mg Oral BID  .  calcium acetate  2,668 mg Oral TID WC  . Chlorhexidine Gluconate Cloth  6 each Topical Q0600  . clopidogrel  75 mg Oral QPM  . docusate sodium  100 mg Oral Daily  . famotidine  20 mg Oral QPM  . furosemide  80 mg Oral BID  . heparin  5,000 Units Subcutaneous Q8H  . insulin aspart  0-9 Units Subcutaneous TID WC  . insulin aspart  12 Units Subcutaneous TID WC  . isosorbide mononitrate  90 mg Oral QPM  . levothyroxine  200 mcg Oral QAC breakfast  . metoprolol succinate  100 mg Oral BID  . multivitamin  1 tablet Oral QHS  . rosuvastatin  10 mg Oral QPM    have reviewed scheduled and prn medications.  Physical Exam: General: much better Heart:RRR Lungs: mostly clear Abdomen: soft, non tender Extremities: min edema- bilat amps Dialysis Access: left upper arm AVF- patent      05/23/2019,8:25 AM  LOS: 2 days

## 2019-05-23 NOTE — TOC Transition Note (Addendum)
Transition of Care [facility]) - CM/SW Discharge Note Marvetta Gibbons RN, BSN Transitions of Care Unit 4E- RN Case Manager [phone]   Patient Details  Name: Angela Soto MRN: [id number] Date of Birth: 03-04-1971  Transition of Care [facility]) CM/SW Contact:  Dawayne Patricia, RN Phone Number: 05/23/2019, 1:04 PM   Clinical Narrative:    Pt stable for transition home today, Cm spoke with pt at bedside and initially pt declined [place] needs- stated that she had CAPS program 43hs/week and her husband was her aide. She also reports she has a nurse that checks on her. Pt states that she has all needed DME at home. However after attempted to transfer from bed to her w/c and unable to do so- pt was agreeable to [facility] services - discussed with pt [facility] and [facility] First program- pt voices that she is agreeable to referral to both as she didn't realize it "would be so different" - CM has spoken with Drue Novel at [facility] who will f/u with pt- and also called Tommi Rumps with [facility] for [facility] and Home First referral- Tommi Rumps will f/u to see if pt is eligible for Home First and if not then [facility] services will still be provided. Call made to vascular for [facility] orders.   Update- per Tommi Rumps with [facility]- pt is not eligible for Home First program- they will provide HHPT/OT  Final next level of care: Home w Home Health Services Barriers to Discharge: No Barriers Identified   Patient Goals and CMS Choice Patient states their goals for this hospitalization and ongoing recovery are:: "to feel better and be able to do more for myself" CMS Medicare.gov Compare Post Acute Care list provided to:: Patient Choice offered to / list presented to : Patient  Discharge Placement  Home with [facility]                     Discharge Plan and Services   Discharge Planning Services: CM Consult Post Acute Care Choice: Home Health, NA          DME Arranged: N/A DME Agency: NA       HH Arranged: PT, OT, Nurse's Aide [place] Agency: [place] Date [place]: 05/23/19 Time [place]: 1150 Representative spoke with at [place]: [place] ([place]) Interventions     Readmission Risk Interventions Readmission Risk Prevention Plan 05/23/2019  Transportation Screening Complete  PCP or Specialist Appt within 5-7 Days Complete  Home Care Screening Complete  Medication Review (RN CM) Complete  Some recent data might be hidden

## 2019-05-24 DIAGNOSIS — Z992 Dependence on renal dialysis: Secondary | ICD-10-CM | POA: Diagnosis not present

## 2019-05-24 DIAGNOSIS — N186 End stage renal disease: Secondary | ICD-10-CM | POA: Diagnosis not present

## 2019-05-24 DIAGNOSIS — D509 Iron deficiency anemia, unspecified: Secondary | ICD-10-CM | POA: Diagnosis not present

## 2019-05-27 ENCOUNTER — Encounter: Payer: Self-pay | Admitting: *Deleted

## 2019-05-27 ENCOUNTER — Other Ambulatory Visit: Payer: Self-pay | Admitting: *Deleted

## 2019-05-27 DIAGNOSIS — D509 Iron deficiency anemia, unspecified: Secondary | ICD-10-CM | POA: Diagnosis not present

## 2019-05-27 DIAGNOSIS — Z992 Dependence on renal dialysis: Secondary | ICD-10-CM | POA: Diagnosis not present

## 2019-05-27 DIAGNOSIS — N186 End stage renal disease: Secondary | ICD-10-CM | POA: Diagnosis not present

## 2019-05-27 NOTE — Patient Outreach (Signed)
Referral received from hospital liason, pt hospitalized 8/4-05/23/19 with new Left AKA, (previously Bil BKA) pt with ESRD with dialysis M, W, F, CAD, HTN, MS, DM type 1, tobacco abuse.  Outreach call to pt for screening, transition of care week 1, spoke with pt, HIPAA verified, pt reports she has had no issues so far with left amputation site and will see surgeon on 06/21/19 and has appointment with primary care on 06/03/19 and rides [facility] for in county appointments, [place] transport for out of county and relatives to assist with shopping, etc as pt, husband do not have a car, pt reports she has all medications and taking as prescribed, pt states CBG " is good with readings 77, 91 and low 100's for highest readings"   Pt states she has had some nausea this past weekend and zofran helps.  Pt is on CAP program with husband primary CG with this program.  Pt feels ESRD is "biggest problem for me" because it causes all kinds of other problems.  Pt will have [facility] and not sure if they tried to call her as she has not answered the phone every time.  Pt will call today and check on and if has any issues with this, will let RN CM know.  RN CM faxed today's note and barrier letter to primary MD.  Outpatient Encounter Medications as of 05/27/2019  Medication Sig  . aspirin EC 81 MG tablet Take 1 tablet (81 mg total) by mouth daily. (Patient taking differently: Take 81 mg by mouth 2 (two) times a day. )  . calcium acetate (PHOSLO) 667 MG capsule Take 1,334-2,668 mg by mouth See admin instructions. Take 4 capsules (2668 mg) by mouth with meals and take 2 capsules (1334 mg) by mouth with snacks.  . clopidogrel (PLAVIX) 75 MG tablet Take 75 mg by mouth every evening.   . famotidine (PEPCID) 40 MG tablet Take 40 mg by mouth every evening.  . folic acid-vitamin b complex-vitamin c-selenium-zinc (DIALYVITE) 3 MG TABS tablet Take 1 tablet by mouth daily.  . furosemide (LASIX) 80 MG tablet Take 80 mg by mouth 2 (two)  times a day.  . gabapentin (NEURONTIN) 100 MG capsule Take 100-200 mg by mouth 3 (three) times daily as needed (pain. (scheduled at night before bedtime)).  Marland Kitchen insulin glargine (LANTUS) 100 UNIT/ML injection Inject 31-62 Units into the skin at bedtime as needed (for blood sugars greater than 150).   . insulin lispro (HUMALOG) 100 UNIT/ML injection Inject 12 Units into the skin 3 (three) times daily as needed for high blood sugar (blood sugars greater than 130).   . isosorbide mononitrate (IMDUR) 60 MG 24 hr tablet Take 60 mg in the am, and 30 mg ( 1/2 tablet) in the pm (Patient taking differently: Take 90 mg by mouth every evening. )  . levothyroxine (SYNTHROID) 200 MCG tablet Take 200 mcg by mouth daily before breakfast.  . lidocaine-prilocaine (EMLA) cream Apply 1 application topically as needed (for pain/ apply prior to dialysis).  . metoprolol succinate (TOPROL-XL) 100 MG 24 hr tablet Take 100 mg by mouth 2 (two) times a day.   . Omega-3 Fatty Acids (FISH OIL) 1000 MG CAPS Take 1,000 mg by mouth 2 (two) times a day.   . Oxycodone HCl 10 MG TABS Take 1 tablet (10 mg total) by mouth every 6 (six) hours as needed (for pain).  . rosuvastatin (CRESTOR) 10 MG tablet TAKE 1 TABLET BY MOUTH DAILY (Patient taking differently: Take  10 mg by mouth every evening. )  . topiramate (TOPAMAX) 50 MG tablet Take 50 mg by mouth daily as needed (headaches.).   No facility-administered encounter medications on file as of 05/27/2019.    THN CM Care Plan Problem One     Most Recent Value  Care Plan Problem One  Decreased quality of life related to ESRD  Role Documenting the Problem One  Care Management Coordinator  Care Plan for Problem One  Active  THN Long Term Goal   Pt will verbalize improved self care related to ESRD to promote better quality of life within 60 days  THN Long Term Goal Start Date  05/27/19  Interventions for Problem One Long Term Goal  RN CM established plan of care with pt, mailed successful  outreach letter to pt home including consent form, 24 hour nurse line magnet, reviewed discharge instructions, upcoming appointments  THN CM Short Term Goal #1   Pt will decrease smoking over the course of the next month  THN CM Short Term Goal #1 Start Date  05/27/19  Interventions for Short Term Goal #1  RNCM talked with pt about [place] [facility] for resources as pt states she cannot afford nicotine patches, pt has worked with [facility] in the past, RN CM reviewed impact smoking is having on patient's health and overall well being  THN CM Short Term Goal #2   Pt will adhere to renal/ diabetic diet over next 30 days  THN CM Short Term Goal #2 Start Date  05/27/19  Interventions for Short Term Goal #2  RN CM reviewed overview of renal/ diabetic diet and nutritious food choices, importance of good nutrition    THN CM Care Plan Problem Two     Most Recent Value  Care Plan Problem Two  New Left AKA  Role Documenting the Problem Two  Care Management [place] for Problem Two  Active  THN CM Short Term Goal #1   Pt incision will heal without complication within 30 days  THN CM Short Term Goal #1 Start Date  05/27/19  Interventions for Short Term Goal #2   RN CM reviewed signs/ symptoms infection, reportable signs/ symptoms, importance of adequate protein in diet  THN CM Short Term Goal #2   Pt will work with home health PT, OT and report/ demonstrate safe transfers within 30 days  THN CM Short Term Goal #2 Start Date  05/27/19  Interventions for Short Term Goal #2  RN CM reviewed safety precautions and importance of using DME and assistance of CG, importance of working with home health PT, OT, RN CM ask pt to contact [place] today about when they will begin services (pt has not been answering her phone)     PLAN Continue weekly transition of care Follow up with [facility] for collaboration, resources for pt  Jacqlyn Larsen [facility], [place] Coordinator [phone]

## 2019-05-28 ENCOUNTER — Other Ambulatory Visit: Payer: Self-pay | Admitting: *Deleted

## 2019-05-28 NOTE — Patient Outreach (Addendum)
Outreach call to [facility] of [facility], pt states she has case worker there that assists her with resources and other aspects of her care.  RN CM called for collaboration and left voicemail requesting return phone call. Return phone call from Nickolas Madrid lead case manager from [place], Per Mr. Geoffery Lyons, patient's regular case manager is Elyse Hsu and he will provide to her RN CM contact information and have her call RN CM.  Mr. Geoffery Lyons states per pt information, they are working on re-assessment for pt and in process of assisting pt to obtain bedside commode and they do require MD order and are working on this.  Any further information regarding resources for pt/ plan of care will need to be discussed with Elyse Hsu.  Jacqlyn Larsen [facility], [place] Coordinator [phone]

## 2019-05-29 DIAGNOSIS — N186 End stage renal disease: Secondary | ICD-10-CM | POA: Diagnosis not present

## 2019-05-29 DIAGNOSIS — D509 Iron deficiency anemia, unspecified: Secondary | ICD-10-CM | POA: Diagnosis not present

## 2019-05-29 DIAGNOSIS — Z992 Dependence on renal dialysis: Secondary | ICD-10-CM | POA: Diagnosis not present

## 2019-05-30 ENCOUNTER — Other Ambulatory Visit: Payer: Self-pay | Admitting: *Deleted

## 2019-05-30 DIAGNOSIS — T8744 Infection of amputation stump, left lower extremity: Secondary | ICD-10-CM | POA: Diagnosis not present

## 2019-05-30 DIAGNOSIS — E1122 Type 2 diabetes mellitus with diabetic chronic kidney disease: Secondary | ICD-10-CM | POA: Diagnosis not present

## 2019-05-30 DIAGNOSIS — T8754 Necrosis of amputation stump, left lower extremity: Secondary | ICD-10-CM | POA: Diagnosis not present

## 2019-05-30 DIAGNOSIS — G35 Multiple sclerosis: Secondary | ICD-10-CM | POA: Diagnosis not present

## 2019-05-30 DIAGNOSIS — I120 Hypertensive chronic kidney disease with stage 5 chronic kidney disease or end stage renal disease: Secondary | ICD-10-CM | POA: Diagnosis not present

## 2019-05-30 DIAGNOSIS — L03116 Cellulitis of left lower limb: Secondary | ICD-10-CM | POA: Diagnosis not present

## 2019-05-30 NOTE — Patient Outreach (Signed)
RN CM spoke with Elyse Hsu care manager at [place] who oversees pt with CAP program, per Anderson Malta, approval/ MD order for [facility] and hoyer lift was obtained and both items are being delivered to pt home today. RN CM spoke with Dr. Florentina Addison office (nephrology) to report pt wants to be placed on kidney transplant list and nurse states they do discuss this with pt at dialysis and asks that RN CM contact [facility] in [place]. RN CM spoke with Caryl Asp and reported pt wishes to be placed on kidney transplant list, per Caryl Asp, social work handles this and she will send note/ email to their social worker to make her aware and they will speak with pt.  Jacqlyn Larsen [facility], [place] Coordinator [phone]

## 2019-05-31 DIAGNOSIS — Z992 Dependence on renal dialysis: Secondary | ICD-10-CM | POA: Diagnosis not present

## 2019-05-31 DIAGNOSIS — N186 End stage renal disease: Secondary | ICD-10-CM | POA: Diagnosis not present

## 2019-05-31 DIAGNOSIS — D509 Iron deficiency anemia, unspecified: Secondary | ICD-10-CM | POA: Diagnosis not present

## 2019-06-03 ENCOUNTER — Other Ambulatory Visit: Payer: Self-pay | Admitting: *Deleted

## 2019-06-03 DIAGNOSIS — D509 Iron deficiency anemia, unspecified: Secondary | ICD-10-CM | POA: Diagnosis not present

## 2019-06-03 DIAGNOSIS — N186 End stage renal disease: Secondary | ICD-10-CM | POA: Diagnosis not present

## 2019-06-03 DIAGNOSIS — Z992 Dependence on renal dialysis: Secondary | ICD-10-CM | POA: Diagnosis not present

## 2019-06-03 NOTE — Patient Outreach (Signed)
Outreach call to pt for transition of care week 2, no answer to telephone, left voicemail requesting return phone call,  RN CM mailed unsuccessful outreach letter to pt home.  PLAN Outreach pt in 3-4 business days  Jacqlyn Larsen [facility], [place] [phone]

## 2019-06-04 DIAGNOSIS — M25561 Pain in right knee: Secondary | ICD-10-CM | POA: Diagnosis not present

## 2019-06-04 DIAGNOSIS — G894 Chronic pain syndrome: Secondary | ICD-10-CM | POA: Diagnosis not present

## 2019-06-04 DIAGNOSIS — M79606 Pain in leg, unspecified: Secondary | ICD-10-CM | POA: Diagnosis not present

## 2019-06-04 DIAGNOSIS — M47816 Spondylosis without myelopathy or radiculopathy, lumbar region: Secondary | ICD-10-CM | POA: Diagnosis not present

## 2019-06-05 DIAGNOSIS — D509 Iron deficiency anemia, unspecified: Secondary | ICD-10-CM | POA: Diagnosis not present

## 2019-06-05 DIAGNOSIS — F17210 Nicotine dependence, cigarettes, uncomplicated: Secondary | ICD-10-CM | POA: Diagnosis not present

## 2019-06-05 DIAGNOSIS — Z89022 Acquired absence of left finger(s): Secondary | ICD-10-CM | POA: Diagnosis not present

## 2019-06-05 DIAGNOSIS — T8754 Necrosis of amputation stump, left lower extremity: Secondary | ICD-10-CM | POA: Diagnosis not present

## 2019-06-05 DIAGNOSIS — Z89611 Acquired absence of right leg above knee: Secondary | ICD-10-CM | POA: Diagnosis not present

## 2019-06-05 DIAGNOSIS — Z89612 Acquired absence of left leg above knee: Secondary | ICD-10-CM | POA: Diagnosis not present

## 2019-06-05 DIAGNOSIS — E1122 Type 2 diabetes mellitus with diabetic chronic kidney disease: Secondary | ICD-10-CM | POA: Diagnosis not present

## 2019-06-05 DIAGNOSIS — Z794 Long term (current) use of insulin: Secondary | ICD-10-CM | POA: Diagnosis not present

## 2019-06-05 DIAGNOSIS — F329 Major depressive disorder, single episode, unspecified: Secondary | ICD-10-CM | POA: Diagnosis not present

## 2019-06-05 DIAGNOSIS — G35 Multiple sclerosis: Secondary | ICD-10-CM | POA: Diagnosis not present

## 2019-06-05 DIAGNOSIS — N186 End stage renal disease: Secondary | ICD-10-CM | POA: Diagnosis not present

## 2019-06-05 DIAGNOSIS — Z992 Dependence on renal dialysis: Secondary | ICD-10-CM | POA: Diagnosis not present

## 2019-06-05 DIAGNOSIS — Z7902 Long term (current) use of antithrombotics/antiplatelets: Secondary | ICD-10-CM | POA: Diagnosis not present

## 2019-06-05 DIAGNOSIS — I120 Hypertensive chronic kidney disease with stage 5 chronic kidney disease or end stage renal disease: Secondary | ICD-10-CM | POA: Diagnosis not present

## 2019-06-05 DIAGNOSIS — T8744 Infection of amputation stump, left lower extremity: Secondary | ICD-10-CM | POA: Diagnosis not present

## 2019-06-05 DIAGNOSIS — Z9049 Acquired absence of other specified parts of digestive tract: Secondary | ICD-10-CM | POA: Diagnosis not present

## 2019-06-05 DIAGNOSIS — L03116 Cellulitis of left lower limb: Secondary | ICD-10-CM | POA: Diagnosis not present

## 2019-06-06 ENCOUNTER — Other Ambulatory Visit: Payer: Self-pay | Admitting: *Deleted

## 2019-06-06 NOTE — Patient Outreach (Signed)
Outreach call to pt for transition of care week 2, spoke with pt, HIPAA verified, pt reports she is doing " pretty well"  Continues dialysis 3 x weekly,  RN CM informed pt that RN CM called to dialysis center and they sent message to social worker that pt wishes to be placed on transplant list,  RN CM ask pt to speak with social worker about the matter and to let RN CM know if she needs further assistnace.Pt reports CBG yesterday 99 and "all readings have been good"  Pt reports she did get BSC and hoyer lift, home health continues working with pt, pt to see surgeon 06/21/19 and has had no issues with left AKA incision. No new concerns voiced.  THN CM Care Plan Problem One     Most Recent Value  Care Plan Problem One  Decreased quality of life related to ESRD  Role Documenting the Problem One  Care Management Coordinator  Care Plan for Problem One  Active  THN Long Term Goal   Pt will verbalize improved self care related to ESRD to promote better quality of life within 60 days  THN Long Term Goal Start Date  05/27/19  Interventions for Problem One Long Term Goal  RN CM reviewed plan of care with pt, pt does now have BSC and hoyer lift and no questions about using equipment, home health continues, pt to see surgeon 06/21/19  THN CM Short Term Goal #1   Pt will decrease smoking over the course of the next month  THN CM Short Term Goal #1 Start Date  05/27/19  THN CM Short Term Goal #2   Pt will adhere to renal/ diabetic diet over next 30 days  THN CM Short Term Goal #2 Start Date  05/27/19  Interventions for Short Term Goal #2  RN CM reinforced renal/ diabetic diet    THN CM Care Plan Problem Two     Most Recent Value  Care Plan Problem Two  New Left AKA  Role Documenting the Problem Two  Care Management Coordinator  Care Plan for Problem Two  Active  THN CM Short Term Goal #1   Pt incision will heal without complication within 30 days  THN CM Short Term Goal #1 Start Date  05/27/19  Interventions  for Short Term Goal #2   RN CM reinforced signs/ symptoms infection, pt state home health RN is observing post op site and states " nurse said it looks wonderful"  THN CM Short Term Goal #2   Pt will work with home health PT, OT and report/ demonstrate safe transfers within 30 days  THN CM Short Term Goal #2 Start Date  05/27/19  Interventions for Short Term Goal #2  RN CM reinforced safety precautions.      PLAN Continue weekly transition of care  Jacqlyn Larsen [facility], [place] Coordinator [phone]

## 2019-06-07 DIAGNOSIS — Z992 Dependence on renal dialysis: Secondary | ICD-10-CM | POA: Diagnosis not present

## 2019-06-07 DIAGNOSIS — D509 Iron deficiency anemia, unspecified: Secondary | ICD-10-CM | POA: Diagnosis not present

## 2019-06-07 DIAGNOSIS — N186 End stage renal disease: Secondary | ICD-10-CM | POA: Diagnosis not present

## 2019-06-10 DIAGNOSIS — N186 End stage renal disease: Secondary | ICD-10-CM | POA: Diagnosis not present

## 2019-06-10 DIAGNOSIS — D509 Iron deficiency anemia, unspecified: Secondary | ICD-10-CM | POA: Diagnosis not present

## 2019-06-10 DIAGNOSIS — Z992 Dependence on renal dialysis: Secondary | ICD-10-CM | POA: Diagnosis not present

## 2019-06-12 DIAGNOSIS — Z992 Dependence on renal dialysis: Secondary | ICD-10-CM | POA: Diagnosis not present

## 2019-06-12 DIAGNOSIS — N186 End stage renal disease: Secondary | ICD-10-CM | POA: Diagnosis not present

## 2019-06-12 DIAGNOSIS — D509 Iron deficiency anemia, unspecified: Secondary | ICD-10-CM | POA: Diagnosis not present

## 2019-06-13 ENCOUNTER — Other Ambulatory Visit: Payer: Self-pay | Admitting: *Deleted

## 2019-06-13 NOTE — Patient Outreach (Signed)
Outreach call to pt for transition of care week 3, no answer to telephone and no option to leave voicemail.  RN CM mailed unsuccessful outreach letter to pt home.  PLAN Outreach pt in 3-4 business days  Jacqlyn Larsen [facility], [place] [phone]

## 2019-06-14 DIAGNOSIS — N186 End stage renal disease: Secondary | ICD-10-CM | POA: Diagnosis not present

## 2019-06-14 DIAGNOSIS — Z992 Dependence on renal dialysis: Secondary | ICD-10-CM | POA: Diagnosis not present

## 2019-06-14 DIAGNOSIS — D509 Iron deficiency anemia, unspecified: Secondary | ICD-10-CM | POA: Diagnosis not present

## 2019-06-17 DIAGNOSIS — N186 End stage renal disease: Secondary | ICD-10-CM | POA: Diagnosis not present

## 2019-06-17 DIAGNOSIS — Z992 Dependence on renal dialysis: Secondary | ICD-10-CM | POA: Diagnosis not present

## 2019-06-17 DIAGNOSIS — D509 Iron deficiency anemia, unspecified: Secondary | ICD-10-CM | POA: Diagnosis not present

## 2019-06-17 NOTE — Progress Notes (Signed)
POST OPERATIVE OFFICE NOTE    CC:  F/u for surgery  HPI:  This is a 48 y.o. female who is s/p left above knee amputation by Dr. Donzetta Matters on 05/21/2019.  She recently had debridement of a left below-knee amputation site that has failed to heal.  She has significant pain is indicated for conversion to left above-knee amputation.  There was adequate bleeding from the muscle and resolved reactive to cautery to expect healing.  She was discharged home in a couple of days.   She states she has done well since being discharged.  She says her pain has greatly improved.  She states she has an occasional sharp pain but improves quickly.    Allergies  Allergen Reactions  . Metformin And Related Diarrhea and Nausea And Vomiting  . Chantix [Varenicline]     "EVIL DREAMS"    Current Outpatient Medications  Medication Sig Dispense Refill  . aspirin EC 81 MG tablet Take 1 tablet (81 mg total) by mouth daily. (Patient taking differently: Take 81 mg by mouth 2 (two) times a day. )    . calcium acetate (PHOSLO) 667 MG capsule Take 1,334-2,668 mg by mouth See admin instructions. Take 4 capsules (2668 mg) by mouth with meals and take 2 capsules (1334 mg) by mouth with snacks.    . clopidogrel (PLAVIX) 75 MG tablet Take 75 mg by mouth every evening.     . famotidine (PEPCID) 40 MG tablet Take 40 mg by mouth every evening.    . folic acid-vitamin b complex-vitamin c-selenium-zinc (DIALYVITE) 3 MG TABS tablet Take 1 tablet by mouth daily.    . furosemide (LASIX) 80 MG tablet Take 80 mg by mouth 2 (two) times a day.    . gabapentin (NEURONTIN) 100 MG capsule Take 100-200 mg by mouth 3 (three) times daily as needed (pain. (scheduled at night before bedtime)).    Marland Kitchen insulin glargine (LANTUS) 100 UNIT/ML injection Inject 31-62 Units into the skin at bedtime as needed (for blood sugars greater than 150).     . insulin lispro (HUMALOG) 100 UNIT/ML injection Inject 12 Units into the skin 3 (three) times daily as needed for  high blood sugar (blood sugars greater than 130).     . isosorbide mononitrate (IMDUR) 60 MG 24 hr tablet Take 60 mg in the am, and 30 mg ( 1/2 tablet) in the pm (Patient taking differently: Take 90 mg by mouth every evening. ) 135 tablet 3  . levothyroxine (SYNTHROID) 200 MCG tablet Take 200 mcg by mouth daily before breakfast.    . lidocaine-prilocaine (EMLA) cream Apply 1 application topically as needed (for pain/ apply prior to dialysis).    . metoprolol succinate (TOPROL-XL) 100 MG 24 hr tablet Take 100 mg by mouth 2 (two) times a day.     . Omega-3 Fatty Acids (FISH OIL) 1000 MG CAPS Take 1,000 mg by mouth 2 (two) times a day.     . Oxycodone HCl 10 MG TABS Take 1 tablet (10 mg total) by mouth every 6 (six) hours as needed (for pain). 15 tablet 0  . rosuvastatin (CRESTOR) 10 MG tablet TAKE 1 TABLET BY MOUTH DAILY (Patient taking differently: Take 10 mg by mouth every evening. ) 90 tablet 3  . topiramate (TOPAMAX) 50 MG tablet Take 50 mg by mouth daily as needed (headaches.).     No current facility-administered medications for this visit.      ROS:  See HPI  Physical Exam:  Today's Vitals  06/21/19 1418  BP: (!) 145/78  Pulse: 62  Resp: 14  Temp: 97.6 F (36.4 C)  TempSrc: Temporal  SpO2: 98%  Weight: 202 lb (91.6 kg)  Height: 4\' 3"  (1.295 m)   Body mass index is 54.6 kg/m.   Incision:  Healed nicely with staples in tact.    Assessment/Plan:  This is a 48 y.o. female who is s/p:  left above knee amputation by Dr. Donzetta Matters on 05/21/2019.  -pt also examined by Dr. Donzetta Matters- -incision looks good-will get staples out today -fistula is working well -she will f/u as needed.    Leontine Locket, PA-C [facility] [phone]  Clinic MD:  Donzetta Matters

## 2019-06-18 ENCOUNTER — Other Ambulatory Visit: Payer: Self-pay | Admitting: *Deleted

## 2019-06-18 DIAGNOSIS — N186 End stage renal disease: Secondary | ICD-10-CM

## 2019-06-18 NOTE — Patient Outreach (Signed)
Outreach call to pt for transition of care week 3, spoke with pt, HIPAA verified, pt reports she spoke with Dr. Juanda Crumble (Dr. Florentina Addison assistant) about kidney transplant and will continue discussing the matter.  Pt to see surgeon 06/21/19, home health continues,  CBG 120.  RN CM placed order for CSW after consulting with peers/ difficult case discussion (for counseling that may benefit pt) and pt is agreeable to this.   THN CM Care Plan Problem One     Most Recent Value  Care Plan Problem One  Decreased quality of life related to ESRD  Role Documenting the Problem One  Care Management Coordinator  Care Plan for Problem One  Active  THN Long Term Goal   Pt will verbalize improved self care related to ESRD to promote better quality of life within 60 days  THN Long Term Goal Start Date  05/27/19  Interventions for Problem One Long Term Goal  RN CM discussed plan of care with pt, pt agreeable to CSW [facility] ) referral for resources, counseling.  THN CM Short Term Goal #1   Pt will decrease smoking over the course of the next month  THN CM Short Term Goal #1 Start Date  05/27/19  THN CM Short Term Goal #2   Pt will adhere to renal/ diabetic diet over next 30 days  THN CM Short Term Goal #2 Start Date  05/27/19  Interventions for Short Term Goal #2  RN CM reinforced diabetic/ renal diet    THN CM Care Plan Problem Two     Most Recent Value  Care Plan Problem Two  New Left AKA  Role Documenting the Problem Two  Care Management Coordinator  Care Plan for Problem Two  Active  THN CM Short Term Goal #1   Pt incision will heal without complication within 30 days  THN CM Short Term Goal #1 Start Date  05/27/19  THN CM Short Term Goal #2   Pt will work with home health PT, OT and report/ demonstrate safe transfers within 30 days  [facility] CM Short Term Goal #2 Start Date  05/27/19      PLAN Continue weekly transition of care  Jacqlyn Larsen [facility], [place] Coordinator [phone]

## 2019-06-19 ENCOUNTER — Encounter: Payer: Self-pay | Admitting: *Deleted

## 2019-06-19 ENCOUNTER — Other Ambulatory Visit: Payer: Self-pay | Admitting: *Deleted

## 2019-06-19 DIAGNOSIS — N2581 Secondary hyperparathyroidism of renal origin: Secondary | ICD-10-CM | POA: Diagnosis not present

## 2019-06-19 DIAGNOSIS — N186 End stage renal disease: Secondary | ICD-10-CM | POA: Diagnosis not present

## 2019-06-19 DIAGNOSIS — D509 Iron deficiency anemia, unspecified: Secondary | ICD-10-CM | POA: Diagnosis not present

## 2019-06-19 DIAGNOSIS — Z992 Dependence on renal dialysis: Secondary | ICD-10-CM | POA: Diagnosis not present

## 2019-06-19 NOTE — Patient Outreach (Signed)
[facility]) Care Management  06/19/2019  Angela Soto 1971-09-27 [id number]   CSW made an initial attempt to try and contact patient today to perform the initial phone assessment, as well as assess and assist with social work needs and services, without success.  A HIPAA compliant message was left for patient on voicemail.  CSW is currently awaiting a return call.  CSW will make a second outreach attempt within the next 3-4 business days, if a return call is not received from patient in the meantime.  CSW will also mail an Outreach Letter to patient's home requesting that patient contact CSW if patient is interested in receiving social work services through [place] with Scientist, clinical (histocompatibility and immunogenetics).  Nat Christen, BSW, MSW, LCSW  Licensed Education officer, environmental Health System  Mailing [place] [address] [place], [place] [address] Physical Address-[address], [place], [place] [address] Toll Free Main # [phone] Fax # [phone] Cell # [phone]  Office # [phone] [email or web address]

## 2019-06-20 DIAGNOSIS — T8754 Necrosis of amputation stump, left lower extremity: Secondary | ICD-10-CM | POA: Diagnosis not present

## 2019-06-20 DIAGNOSIS — G35 Multiple sclerosis: Secondary | ICD-10-CM | POA: Diagnosis not present

## 2019-06-20 DIAGNOSIS — L03116 Cellulitis of left lower limb: Secondary | ICD-10-CM | POA: Diagnosis not present

## 2019-06-20 DIAGNOSIS — I120 Hypertensive chronic kidney disease with stage 5 chronic kidney disease or end stage renal disease: Secondary | ICD-10-CM | POA: Diagnosis not present

## 2019-06-20 DIAGNOSIS — E1122 Type 2 diabetes mellitus with diabetic chronic kidney disease: Secondary | ICD-10-CM | POA: Diagnosis not present

## 2019-06-20 DIAGNOSIS — T8744 Infection of amputation stump, left lower extremity: Secondary | ICD-10-CM | POA: Diagnosis not present

## 2019-06-21 ENCOUNTER — Other Ambulatory Visit: Payer: Self-pay

## 2019-06-21 ENCOUNTER — Ambulatory Visit (INDEPENDENT_AMBULATORY_CARE_PROVIDER_SITE_OTHER): Payer: Self-pay | Admitting: Physician Assistant

## 2019-06-21 VITALS — BP 145/78 | HR 62 | Temp 97.6°F | Resp 14 | Ht <= 58 in | Wt 202.0 lb

## 2019-06-21 DIAGNOSIS — T8789 Other complications of amputation stump: Secondary | ICD-10-CM

## 2019-06-21 DIAGNOSIS — I998 Other disorder of circulatory system: Secondary | ICD-10-CM

## 2019-06-22 DIAGNOSIS — Z992 Dependence on renal dialysis: Secondary | ICD-10-CM | POA: Diagnosis not present

## 2019-06-22 DIAGNOSIS — N186 End stage renal disease: Secondary | ICD-10-CM | POA: Diagnosis not present

## 2019-06-22 DIAGNOSIS — N2581 Secondary hyperparathyroidism of renal origin: Secondary | ICD-10-CM | POA: Diagnosis not present

## 2019-06-22 DIAGNOSIS — D509 Iron deficiency anemia, unspecified: Secondary | ICD-10-CM | POA: Diagnosis not present

## 2019-06-24 DIAGNOSIS — N186 End stage renal disease: Secondary | ICD-10-CM | POA: Diagnosis not present

## 2019-06-24 DIAGNOSIS — N2581 Secondary hyperparathyroidism of renal origin: Secondary | ICD-10-CM | POA: Diagnosis not present

## 2019-06-24 DIAGNOSIS — D509 Iron deficiency anemia, unspecified: Secondary | ICD-10-CM | POA: Diagnosis not present

## 2019-06-24 DIAGNOSIS — Z992 Dependence on renal dialysis: Secondary | ICD-10-CM | POA: Diagnosis not present

## 2019-06-25 ENCOUNTER — Other Ambulatory Visit: Payer: Self-pay | Admitting: *Deleted

## 2019-06-25 ENCOUNTER — Encounter: Payer: Self-pay | Admitting: *Deleted

## 2019-06-25 NOTE — Patient Outreach (Signed)
Outreach call to pt for transition of care week 4, spoke with pt who reports she is out grocery shopping and not a good time to talk,  Pt requests call back on Thursday 06/27/19.  PLAN Outreach pt this week on 06/27/19 for transition of care week [facility], [place] Coordinator [phone]

## 2019-06-25 NOTE — Patient Outreach (Signed)
Angela Soto) Care Management  06/25/2019  Angela Soto 11-04-1970 [id number]   CSW was able to make initial contact with patient today to perform phone assessment, as well as assess and assist with social work needs and services.  CSW introduced self, explained role and types of services provided through [place] [facility] ([facility]).  CSW further explained to patient that CSW works with patient's RNCM, also with [place] [facility], Jacqlyn Larsen.  CSW then explained the reason for the call, indicating that Angela Soto thought that patient would benefit from social work services and resources to assist with counseling and supportive services.  CSW obtained two HIPAA compliant identifiers from patient, which included patient's name and date of birth.  Patient started off the conversation by stating that she already has two Theatre stage manager involved with her care, not wanting to take on a third because it would be too difficult for her to keep track of which social worker is doing what for her.  CSW explained to patient that CSW with [facility] is willing to provide counseling and supportive services to her for End Stage Renal Disease, as well as Stage V Chronic Kidney Disease.  In addition, CSW offered to provide patient with available resources and link patient to various community agencies.  Patient stated, "No offense, but Angela Soto and Angela Soto are already doing all of that for me".  Patient indicated that Angela Soto is her Social Development worker, community with CAP ([employer]), as well as ADTS (Aging, Disability and Transit Services).  "CAP is a federally funded program designed to assist individuals with disabilities in understanding and using rehabilitation services.  CAP is [place] Medicaid's home and community based services waiver program.  In this program, low-income, disabled state residents have an  alternative to nursing home placement.  Program participants remain in their private residences or receive care in the home of a friend or family.  By offering medical and personal care services, [place] aims to increase the disabled and seniors' autonomy".   "ADTS is a Environmental education officer, offering assisted living placement, community resources, companion care services, nutrition services, transportation, and caregiver services to residents of [place].  The mission of ADTS is to enhance the quality of life for individuals by empowering them to achieve optimum health and well-being, independence, and participation in the community".  CSW is aware that both programs are provided through the [place] and are paid for by patient's Adult Medicaid benefits.  Patient reported that she is extremely pleased with the services that she receives through CAP and ADTS.  Patient went on to explain that Angela Soto is her Social Development worker, community with [facility], where patient attends dialysis treatments on Monday, Wednesday and Friday.  Patient stated that both Angela Soto and Angela Soto meet with her on a weekly basis, sometimes several times per week, depending on the week and what services are needed.  Patient admitted that Angela Soto and [name] do a wonderful job of providing counseling and supportive services to her, in addition to linking her to all services and resources that patient my qualify for in the community.  Patient indicated that Angela Soto is currently working with her on getting her placed on a transplant list.  Patient reported that she lives at home with her husband, Angela Soto and that [name] is able to assist her with activities of  daily living, after each dialysis treatment or when she is not feeling well.  Patient admitted to having a very good support system, through Mr.  Bassin, as well as through various other family members, friends, neighbors and patients at the dialysis clinic.  Patient indicated that she uses [facility] to transport her to and from all physician appointments within [place] for out of county physician appointments, to visit family members, to [employer], etc., as she and [name] do not own a vehicle.  Patient admitted that she is able to afford to pay for all her prescription medications through Adult Medicaid and that she takes all her medications exactly as prescribed.  Patient denies the need for in-home caregiver services, indicating that [name] is her primary caregiver and that she currently receives home health physical therapy and occupational therapy services through [facility]  Patient indicated that she and [name] do not experience food insecurities, nor do they have housing issues.  Patient did not wish to discuss completion of her Advanced Directives ([place] documents), reporting that [name] is already aware of her wishes.    CSW will perform a case closure on patient, as all goals of treatment have been met from social work standpoint and no additional social work needs have been identified at this time.  CSW will notify patient's RNCM with [facility], Jacqlyn Larsen of CSW's plans to close patient's case.  CSW will fax an update to patient's Primary Care Physician, Dr. Stoney Bang to ensure that they are aware of CSW's involvement with patient's plan of care.  CSW was able to ensure that patient has the correct contact information for CSW, encouraging patient to contact CSW directly if additional social work needs arise in the near future.  Patient voiced understanding and was agreeable to this plan.    Nat Christen, BSW, MSW, LCSW  Licensed [facility]  Mailing [place] [address] [place], [place] [address] Physical Address-[address], [place], [place] [address] Toll Free Main # [phone] Fax # [phone] Cell # [phone]  Office # [phone] [email or web address]

## 2019-06-26 DIAGNOSIS — D509 Iron deficiency anemia, unspecified: Secondary | ICD-10-CM | POA: Diagnosis not present

## 2019-06-26 DIAGNOSIS — N186 End stage renal disease: Secondary | ICD-10-CM | POA: Diagnosis not present

## 2019-06-26 DIAGNOSIS — Z992 Dependence on renal dialysis: Secondary | ICD-10-CM | POA: Diagnosis not present

## 2019-06-26 DIAGNOSIS — N2581 Secondary hyperparathyroidism of renal origin: Secondary | ICD-10-CM | POA: Diagnosis not present

## 2019-06-27 ENCOUNTER — Other Ambulatory Visit: Payer: Self-pay | Admitting: *Deleted

## 2019-06-27 DIAGNOSIS — T8744 Infection of amputation stump, left lower extremity: Secondary | ICD-10-CM | POA: Diagnosis not present

## 2019-06-27 DIAGNOSIS — L03116 Cellulitis of left lower limb: Secondary | ICD-10-CM | POA: Diagnosis not present

## 2019-06-27 DIAGNOSIS — G35 Multiple sclerosis: Secondary | ICD-10-CM | POA: Diagnosis not present

## 2019-06-27 DIAGNOSIS — E1122 Type 2 diabetes mellitus with diabetic chronic kidney disease: Secondary | ICD-10-CM | POA: Diagnosis not present

## 2019-06-27 DIAGNOSIS — I120 Hypertensive chronic kidney disease with stage 5 chronic kidney disease or end stage renal disease: Secondary | ICD-10-CM | POA: Diagnosis not present

## 2019-06-27 DIAGNOSIS — T8754 Necrosis of amputation stump, left lower extremity: Secondary | ICD-10-CM | POA: Diagnosis not present

## 2019-06-27 NOTE — Patient Outreach (Signed)
Outreach call to pt for transition of care week 4, no answer to telephone, left voicemail requesting return phone call.  RN CM mailed unsuccessful outreach letter to pt home.  PLAN Outreach pt in 3-4 business days  Jacqlyn Larsen [facility], [place] [phone]

## 2019-06-28 DIAGNOSIS — N186 End stage renal disease: Secondary | ICD-10-CM | POA: Diagnosis not present

## 2019-06-28 DIAGNOSIS — D509 Iron deficiency anemia, unspecified: Secondary | ICD-10-CM | POA: Diagnosis not present

## 2019-06-28 DIAGNOSIS — Z992 Dependence on renal dialysis: Secondary | ICD-10-CM | POA: Diagnosis not present

## 2019-06-28 DIAGNOSIS — N2581 Secondary hyperparathyroidism of renal origin: Secondary | ICD-10-CM | POA: Diagnosis not present

## 2019-07-01 DIAGNOSIS — N186 End stage renal disease: Secondary | ICD-10-CM | POA: Diagnosis not present

## 2019-07-01 DIAGNOSIS — N2581 Secondary hyperparathyroidism of renal origin: Secondary | ICD-10-CM | POA: Diagnosis not present

## 2019-07-01 DIAGNOSIS — D509 Iron deficiency anemia, unspecified: Secondary | ICD-10-CM | POA: Diagnosis not present

## 2019-07-01 DIAGNOSIS — Z992 Dependence on renal dialysis: Secondary | ICD-10-CM | POA: Diagnosis not present

## 2019-07-02 ENCOUNTER — Other Ambulatory Visit: Payer: Self-pay | Admitting: *Deleted

## 2019-07-02 DIAGNOSIS — M25561 Pain in right knee: Secondary | ICD-10-CM | POA: Diagnosis not present

## 2019-07-02 DIAGNOSIS — M47816 Spondylosis without myelopathy or radiculopathy, lumbar region: Secondary | ICD-10-CM | POA: Diagnosis not present

## 2019-07-02 DIAGNOSIS — M79606 Pain in leg, unspecified: Secondary | ICD-10-CM | POA: Diagnosis not present

## 2019-07-02 DIAGNOSIS — G894 Chronic pain syndrome: Secondary | ICD-10-CM | POA: Diagnosis not present

## 2019-07-02 NOTE — Patient Outreach (Signed)
Outreach call to pt for transition of care week 4/ 3rd attempt,  No answer to telephone and no option to leave voicemail.  PLAN Close case next week  Jacqlyn Larsen [facility], BSN [place] Coordinator [phone]

## 2019-07-03 DIAGNOSIS — Z992 Dependence on renal dialysis: Secondary | ICD-10-CM | POA: Diagnosis not present

## 2019-07-03 DIAGNOSIS — D509 Iron deficiency anemia, unspecified: Secondary | ICD-10-CM | POA: Diagnosis not present

## 2019-07-03 DIAGNOSIS — N186 End stage renal disease: Secondary | ICD-10-CM | POA: Diagnosis not present

## 2019-07-03 DIAGNOSIS — N2581 Secondary hyperparathyroidism of renal origin: Secondary | ICD-10-CM | POA: Diagnosis not present

## 2019-07-05 DIAGNOSIS — Z992 Dependence on renal dialysis: Secondary | ICD-10-CM | POA: Diagnosis not present

## 2019-07-05 DIAGNOSIS — D509 Iron deficiency anemia, unspecified: Secondary | ICD-10-CM | POA: Diagnosis not present

## 2019-07-05 DIAGNOSIS — N2581 Secondary hyperparathyroidism of renal origin: Secondary | ICD-10-CM | POA: Diagnosis not present

## 2019-07-05 DIAGNOSIS — N186 End stage renal disease: Secondary | ICD-10-CM | POA: Diagnosis not present

## 2019-07-08 DIAGNOSIS — N2581 Secondary hyperparathyroidism of renal origin: Secondary | ICD-10-CM | POA: Diagnosis not present

## 2019-07-08 DIAGNOSIS — D509 Iron deficiency anemia, unspecified: Secondary | ICD-10-CM | POA: Diagnosis not present

## 2019-07-08 DIAGNOSIS — N186 End stage renal disease: Secondary | ICD-10-CM | POA: Diagnosis not present

## 2019-07-08 DIAGNOSIS — Z992 Dependence on renal dialysis: Secondary | ICD-10-CM | POA: Diagnosis not present

## 2019-07-09 DIAGNOSIS — Z89511 Acquired absence of right leg below knee: Secondary | ICD-10-CM | POA: Diagnosis not present

## 2019-07-09 DIAGNOSIS — Z1389 Encounter for screening for other disorder: Secondary | ICD-10-CM | POA: Diagnosis not present

## 2019-07-09 DIAGNOSIS — Z0000 Encounter for general adult medical examination without abnormal findings: Secondary | ICD-10-CM | POA: Diagnosis not present

## 2019-07-09 DIAGNOSIS — Z89612 Acquired absence of left leg above knee: Secondary | ICD-10-CM | POA: Diagnosis not present

## 2019-07-09 DIAGNOSIS — I10 Essential (primary) hypertension: Secondary | ICD-10-CM | POA: Diagnosis not present

## 2019-07-09 DIAGNOSIS — E1121 Type 2 diabetes mellitus with diabetic nephropathy: Secondary | ICD-10-CM | POA: Diagnosis not present

## 2019-07-10 DIAGNOSIS — N186 End stage renal disease: Secondary | ICD-10-CM | POA: Diagnosis not present

## 2019-07-10 DIAGNOSIS — Z992 Dependence on renal dialysis: Secondary | ICD-10-CM | POA: Diagnosis not present

## 2019-07-10 DIAGNOSIS — D509 Iron deficiency anemia, unspecified: Secondary | ICD-10-CM | POA: Diagnosis not present

## 2019-07-10 DIAGNOSIS — N2581 Secondary hyperparathyroidism of renal origin: Secondary | ICD-10-CM | POA: Diagnosis not present

## 2019-07-12 DIAGNOSIS — N2581 Secondary hyperparathyroidism of renal origin: Secondary | ICD-10-CM | POA: Diagnosis not present

## 2019-07-12 DIAGNOSIS — E109 Type 1 diabetes mellitus without complications: Secondary | ICD-10-CM | POA: Diagnosis not present

## 2019-07-12 DIAGNOSIS — D509 Iron deficiency anemia, unspecified: Secondary | ICD-10-CM | POA: Diagnosis not present

## 2019-07-12 DIAGNOSIS — N186 End stage renal disease: Secondary | ICD-10-CM | POA: Diagnosis not present

## 2019-07-12 DIAGNOSIS — Z794 Long term (current) use of insulin: Secondary | ICD-10-CM | POA: Diagnosis not present

## 2019-07-12 DIAGNOSIS — Z992 Dependence on renal dialysis: Secondary | ICD-10-CM | POA: Diagnosis not present

## 2019-07-15 DIAGNOSIS — Z992 Dependence on renal dialysis: Secondary | ICD-10-CM | POA: Diagnosis not present

## 2019-07-15 DIAGNOSIS — D509 Iron deficiency anemia, unspecified: Secondary | ICD-10-CM | POA: Diagnosis not present

## 2019-07-15 DIAGNOSIS — N186 End stage renal disease: Secondary | ICD-10-CM | POA: Diagnosis not present

## 2019-07-15 DIAGNOSIS — N2581 Secondary hyperparathyroidism of renal origin: Secondary | ICD-10-CM | POA: Diagnosis not present

## 2019-07-16 ENCOUNTER — Other Ambulatory Visit: Payer: Self-pay | Admitting: *Deleted

## 2019-07-16 NOTE — Patient Outreach (Signed)
Unable to maintain contact with pt,  Case closed, RN CM faxed case closure letter to primary care provider Dr. Sherrie Sport, and mailed case closure letter to patient's home.  Case closed  Jacqlyn Larsen [facility], [place] Coordinator [phone]

## 2019-07-17 DIAGNOSIS — D509 Iron deficiency anemia, unspecified: Secondary | ICD-10-CM | POA: Diagnosis not present

## 2019-07-17 DIAGNOSIS — Z992 Dependence on renal dialysis: Secondary | ICD-10-CM | POA: Diagnosis not present

## 2019-07-17 DIAGNOSIS — N2581 Secondary hyperparathyroidism of renal origin: Secondary | ICD-10-CM | POA: Diagnosis not present

## 2019-07-17 DIAGNOSIS — N186 End stage renal disease: Secondary | ICD-10-CM | POA: Diagnosis not present

## 2019-07-19 DIAGNOSIS — Z23 Encounter for immunization: Secondary | ICD-10-CM | POA: Diagnosis not present

## 2019-07-19 DIAGNOSIS — D509 Iron deficiency anemia, unspecified: Secondary | ICD-10-CM | POA: Diagnosis not present

## 2019-07-19 DIAGNOSIS — Z992 Dependence on renal dialysis: Secondary | ICD-10-CM | POA: Diagnosis not present

## 2019-07-19 DIAGNOSIS — N2581 Secondary hyperparathyroidism of renal origin: Secondary | ICD-10-CM | POA: Diagnosis not present

## 2019-07-19 DIAGNOSIS — N186 End stage renal disease: Secondary | ICD-10-CM | POA: Diagnosis not present

## 2019-07-21 IMAGING — MR MR HEAD W/O CM
10 of 12 series · 27 of 48 positions shown · non-contrast
Comparison: Brain and cervical spine MRI 06/10/2013.

CLINICAL DATA: 46-year-old female with end-stage renal disease on
dialysis. Multiple sclerosis diagnosed in 3746. Headache and tremor
for 1 year or more.

EXAM:
MRI HEAD WITHOUT CONTRAST
TECHNIQUE: Multiplanar, multiecho pulse sequences of the brain and surrounding
structures were obtained without intravenous contrast.

[Series 2: T1 · sagittal · 5.0mm · 0.43mm/px · 2 of 21 slices shown (1 of 2)]
[im 1/21]
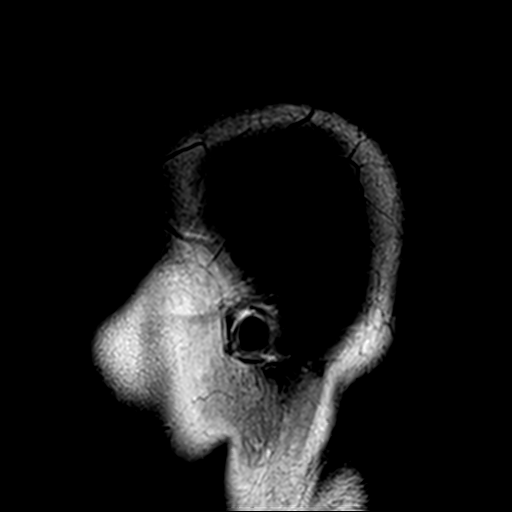
[im 21/21]
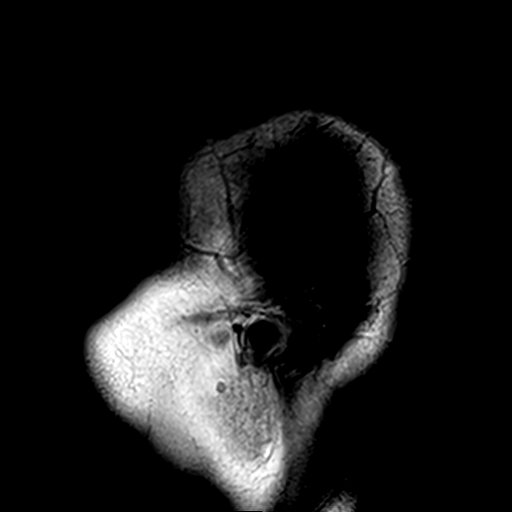

[Series 3: DWI · axial · 3.0mm · 0.77mm/px · z∈[-64,+86]mm · 4 of 51 slices shown (1 of 4)]
[im 1/51]
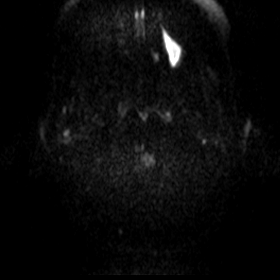
[im 17/51]
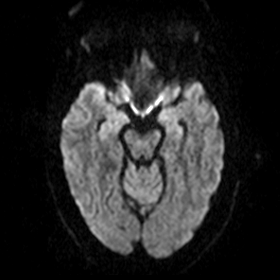
[im 34/51]
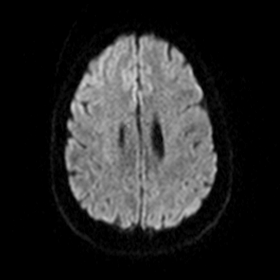
[im 51/51]
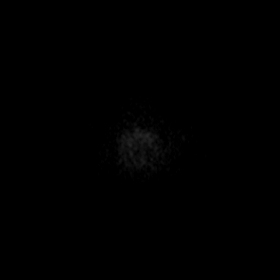

[Series 4: DWI · axial · 3.0mm · 0.77mm/px · z∈[-76,+86]mm · 4 of 55 slices shown (2 of 4)]
[im 1/55]
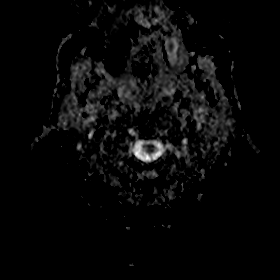
[im 19/55]
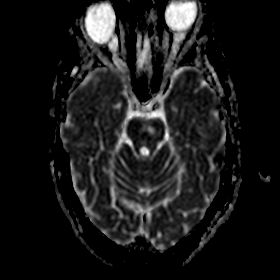
[im 37/55]
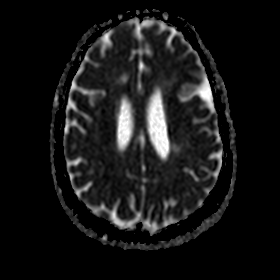
[im 55/55]
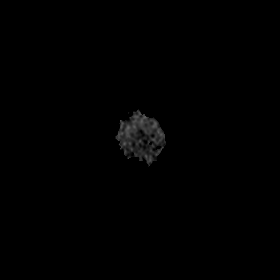

[Series 5: DWI · coronal · 5.0mm · 0.47mm/px · 3 of 38 slices shown (3 of 4)]
[im 1/38]
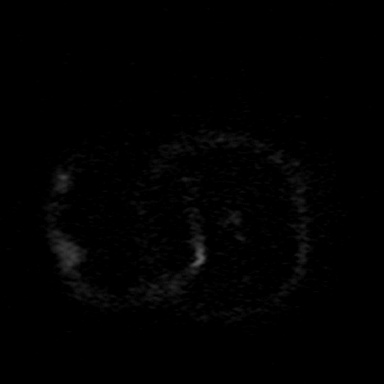
[im 19/38]
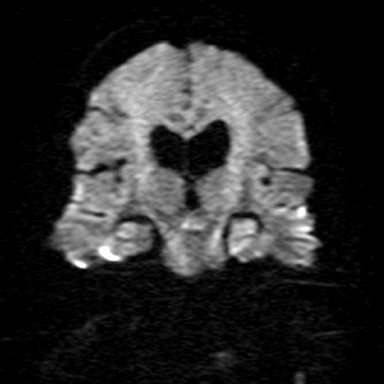
[im 38/38]
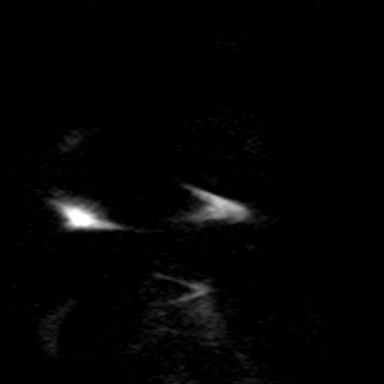

[Series 6: DWI · coronal · 5.0mm · 0.47mm/px · 3 of 38 slices shown (4 of 4)]
[im 1/38]
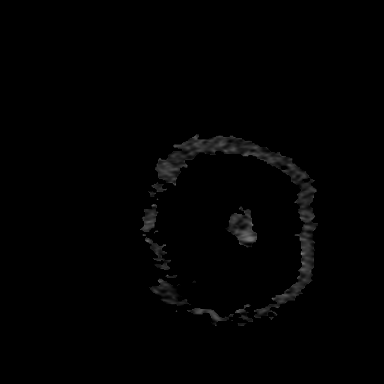
[im 19/38]
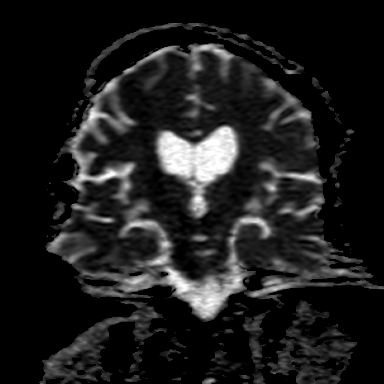
[im 38/38]
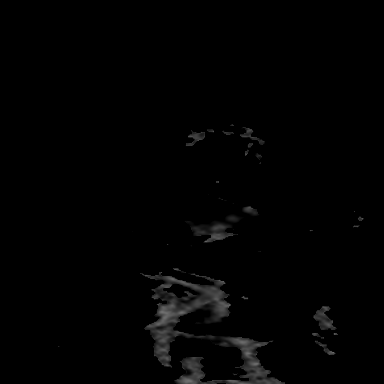

[Series 7: T2 · axial · 5.0mm · 0.51mm/px · z∈[-64,+79]mm · 2 of 23 slices shown (1 of 3)]
[im 1/23]
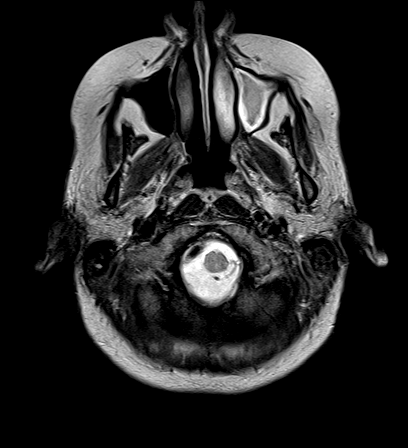
[im 23/23]
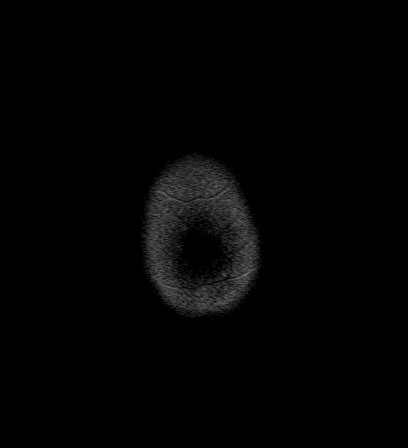

[Series 8: FLAIR · axial · 3.0mm · 0.35mm/px · z∈[-61,+77]mm · 4 of 47 slices shown]
[im 1/47]
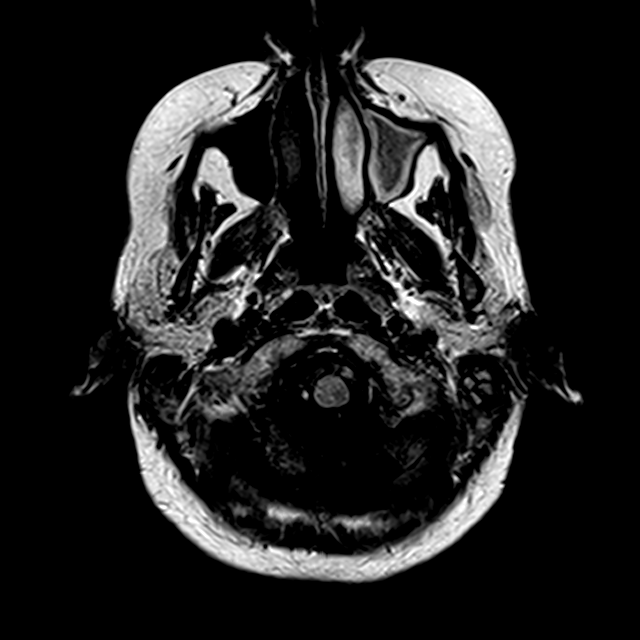
[im 16/47]
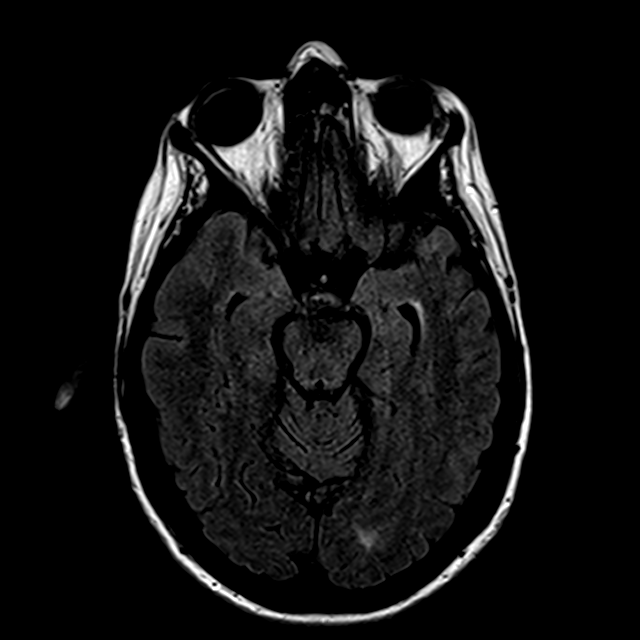
[im 31/47]
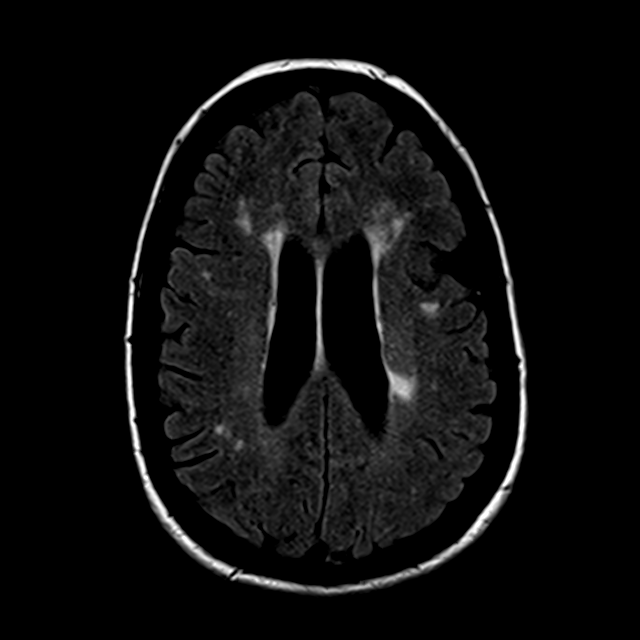
[im 47/47]
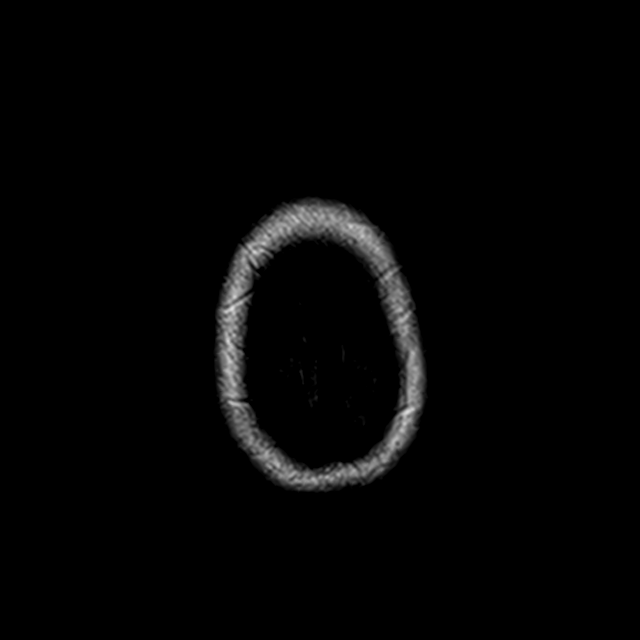

[Series 9: T1 · axial · 2.0mm · 0.43mm/px · 1 of 85 slices shown (2 of 2)]
[im 1/85]
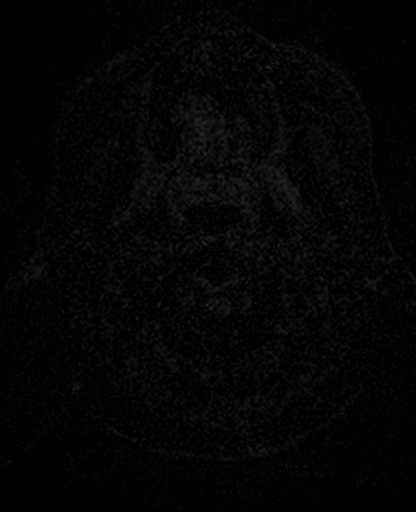

[Series 11: T2 · coronal · 5.0mm · 0.46mm/px · 2 of 28 slices shown (2 of 3)]
[im 1/28]
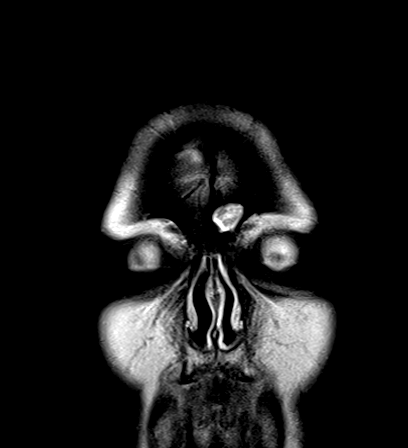
[im 28/28]
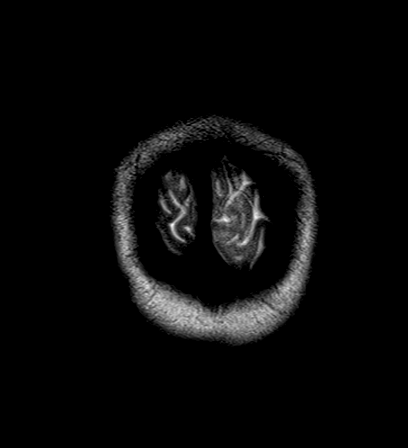

[Series 12: T2 · axial · 5.0mm · 0.67mm/px · z∈[-64,+79]mm · 2 of 23 slices shown (3 of 3)]
[im 1/23]
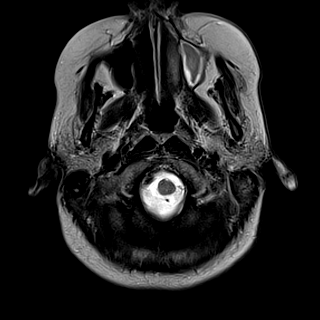
[im 23/23]
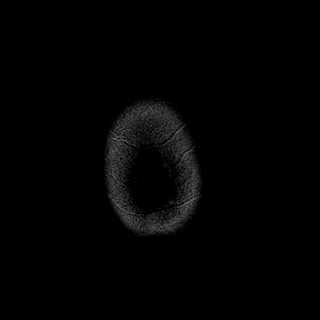

[27 of 48 positions shown; findings below may reference images not displayed]

FINDINGS: Brain: No restricted diffusion or evidence of acute infarction.

Chronic linear and triangular shaped signal abnormality in the right
cerebellar hemisphere on series 12, image 5 is stable since 3746 and
most resembles small chronic cerebellar infarcts. Likewise, chronic
T2 hyperintensity in the left paracentral pons most resembles a
chronic lacune. There is a smaller area of chronic abnormal T2
hyperintensity in the medial right thalamus which also resembles
chronic ischemia. There is a small area of chronic cortical
encephalomalacia in the right parietal lobe at the sensory strip
(series 12, image 20). No other cortical encephalomalacia is
identified. No chronic cerebral blood products are identified.

Superimposed widely scattered, patchy and occasionally nodular
abnormal signal in the bilateral cerebral white matter. There is
periventricular and subcortical involvement. The periventricular
involvement extends to the temporal lobes. There is bilateral
involvement of the posterior deep white matter capsules near the
lateral thalami. No new or progressive areas of involvement are
identified compared to 3746.

Chronic decreased cerebral volume over that expected for age, stable
since 3746. No midline shift, mass effect, evidence of mass lesion,
ventriculomegaly, extra-axial collection or acute intracranial
hemorrhage. Cervicomedullary junction and pituitary are within
normal limits.

Vascular: Major intracranial vascular flow voids are stable since
3746 with a degree of chronic intracranial artery dolichoectasia.
The distal right vertebral artery appears dominant while the left is
diminutive and may terminates in PIC

Skull and upper cervical spine: Visible cervical spine appears
normal. Visualized bone marrow signal is within normal limits.

Sinuses/Orbits: Disconjugate gaze. Otherwise orbits soft tissues
appear normal.

Opacified left maxillary sinus with mucosal thickening and
inspissated material. Mild paranasal sinus mucosal thickening
elsewhere appears largely stable since 3746.

Other: Chronic right mastoid effusion, but new left mastoid effusion
since 3746. Fluid in the left tympanic cavity. Negative visible
pharynx.

Scalp and face soft tissues appear negative.
IMPRESSION: 1. Chronic signal abnormality in the brain which is nonspecific but
more resembles chronic ischemic disease than chronic demyelinating
disease. No progression since [DATE].  No acute intracranial abnormality.
3. New fluid in the left middle ear and mastoids since 3746. Chronic
appearing left maxillary sinus disease is also new.

## 2019-07-22 DIAGNOSIS — D509 Iron deficiency anemia, unspecified: Secondary | ICD-10-CM | POA: Diagnosis not present

## 2019-07-22 DIAGNOSIS — Z23 Encounter for immunization: Secondary | ICD-10-CM | POA: Diagnosis not present

## 2019-07-22 DIAGNOSIS — N2581 Secondary hyperparathyroidism of renal origin: Secondary | ICD-10-CM | POA: Diagnosis not present

## 2019-07-22 DIAGNOSIS — Z992 Dependence on renal dialysis: Secondary | ICD-10-CM | POA: Diagnosis not present

## 2019-07-22 DIAGNOSIS — N186 End stage renal disease: Secondary | ICD-10-CM | POA: Diagnosis not present

## 2019-07-24 DIAGNOSIS — D509 Iron deficiency anemia, unspecified: Secondary | ICD-10-CM | POA: Diagnosis not present

## 2019-07-24 DIAGNOSIS — Z992 Dependence on renal dialysis: Secondary | ICD-10-CM | POA: Diagnosis not present

## 2019-07-24 DIAGNOSIS — N2581 Secondary hyperparathyroidism of renal origin: Secondary | ICD-10-CM | POA: Diagnosis not present

## 2019-07-24 DIAGNOSIS — N186 End stage renal disease: Secondary | ICD-10-CM | POA: Diagnosis not present

## 2019-07-24 DIAGNOSIS — Z23 Encounter for immunization: Secondary | ICD-10-CM | POA: Diagnosis not present

## 2019-07-26 DIAGNOSIS — N2581 Secondary hyperparathyroidism of renal origin: Secondary | ICD-10-CM | POA: Diagnosis not present

## 2019-07-26 DIAGNOSIS — Z992 Dependence on renal dialysis: Secondary | ICD-10-CM | POA: Diagnosis not present

## 2019-07-26 DIAGNOSIS — Z23 Encounter for immunization: Secondary | ICD-10-CM | POA: Diagnosis not present

## 2019-07-26 DIAGNOSIS — N186 End stage renal disease: Secondary | ICD-10-CM | POA: Diagnosis not present

## 2019-07-26 DIAGNOSIS — D509 Iron deficiency anemia, unspecified: Secondary | ICD-10-CM | POA: Diagnosis not present

## 2019-07-29 DIAGNOSIS — Z992 Dependence on renal dialysis: Secondary | ICD-10-CM | POA: Diagnosis not present

## 2019-07-29 DIAGNOSIS — D509 Iron deficiency anemia, unspecified: Secondary | ICD-10-CM | POA: Diagnosis not present

## 2019-07-29 DIAGNOSIS — N2581 Secondary hyperparathyroidism of renal origin: Secondary | ICD-10-CM | POA: Diagnosis not present

## 2019-07-29 DIAGNOSIS — N186 End stage renal disease: Secondary | ICD-10-CM | POA: Diagnosis not present

## 2019-07-29 DIAGNOSIS — Z23 Encounter for immunization: Secondary | ICD-10-CM | POA: Diagnosis not present

## 2019-07-30 DIAGNOSIS — M169 Osteoarthritis of hip, unspecified: Secondary | ICD-10-CM | POA: Diagnosis not present

## 2019-07-30 DIAGNOSIS — M47816 Spondylosis without myelopathy or radiculopathy, lumbar region: Secondary | ICD-10-CM | POA: Diagnosis not present

## 2019-07-30 DIAGNOSIS — G894 Chronic pain syndrome: Secondary | ICD-10-CM | POA: Diagnosis not present

## 2019-07-30 DIAGNOSIS — M25561 Pain in right knee: Secondary | ICD-10-CM | POA: Diagnosis not present

## 2019-07-31 DIAGNOSIS — Z23 Encounter for immunization: Secondary | ICD-10-CM | POA: Diagnosis not present

## 2019-07-31 DIAGNOSIS — D509 Iron deficiency anemia, unspecified: Secondary | ICD-10-CM | POA: Diagnosis not present

## 2019-07-31 DIAGNOSIS — N2581 Secondary hyperparathyroidism of renal origin: Secondary | ICD-10-CM | POA: Diagnosis not present

## 2019-07-31 DIAGNOSIS — Z992 Dependence on renal dialysis: Secondary | ICD-10-CM | POA: Diagnosis not present

## 2019-07-31 DIAGNOSIS — N186 End stage renal disease: Secondary | ICD-10-CM | POA: Diagnosis not present

## 2019-08-02 DIAGNOSIS — Z23 Encounter for immunization: Secondary | ICD-10-CM | POA: Diagnosis not present

## 2019-08-02 DIAGNOSIS — D509 Iron deficiency anemia, unspecified: Secondary | ICD-10-CM | POA: Diagnosis not present

## 2019-08-02 DIAGNOSIS — N2581 Secondary hyperparathyroidism of renal origin: Secondary | ICD-10-CM | POA: Diagnosis not present

## 2019-08-02 DIAGNOSIS — N186 End stage renal disease: Secondary | ICD-10-CM | POA: Diagnosis not present

## 2019-08-02 DIAGNOSIS — Z992 Dependence on renal dialysis: Secondary | ICD-10-CM | POA: Diagnosis not present

## 2019-08-05 DIAGNOSIS — D509 Iron deficiency anemia, unspecified: Secondary | ICD-10-CM | POA: Diagnosis not present

## 2019-08-05 DIAGNOSIS — Z992 Dependence on renal dialysis: Secondary | ICD-10-CM | POA: Diagnosis not present

## 2019-08-05 DIAGNOSIS — Z23 Encounter for immunization: Secondary | ICD-10-CM | POA: Diagnosis not present

## 2019-08-05 DIAGNOSIS — N186 End stage renal disease: Secondary | ICD-10-CM | POA: Diagnosis not present

## 2019-08-05 DIAGNOSIS — N2581 Secondary hyperparathyroidism of renal origin: Secondary | ICD-10-CM | POA: Diagnosis not present

## 2019-08-07 DIAGNOSIS — Z992 Dependence on renal dialysis: Secondary | ICD-10-CM | POA: Diagnosis not present

## 2019-08-07 DIAGNOSIS — D509 Iron deficiency anemia, unspecified: Secondary | ICD-10-CM | POA: Diagnosis not present

## 2019-08-07 DIAGNOSIS — N2581 Secondary hyperparathyroidism of renal origin: Secondary | ICD-10-CM | POA: Diagnosis not present

## 2019-08-07 DIAGNOSIS — N186 End stage renal disease: Secondary | ICD-10-CM | POA: Diagnosis not present

## 2019-08-07 DIAGNOSIS — Z23 Encounter for immunization: Secondary | ICD-10-CM | POA: Diagnosis not present

## 2019-08-09 DIAGNOSIS — N2581 Secondary hyperparathyroidism of renal origin: Secondary | ICD-10-CM | POA: Diagnosis not present

## 2019-08-09 DIAGNOSIS — N186 End stage renal disease: Secondary | ICD-10-CM | POA: Diagnosis not present

## 2019-08-09 DIAGNOSIS — D509 Iron deficiency anemia, unspecified: Secondary | ICD-10-CM | POA: Diagnosis not present

## 2019-08-09 DIAGNOSIS — Z23 Encounter for immunization: Secondary | ICD-10-CM | POA: Diagnosis not present

## 2019-08-09 DIAGNOSIS — Z992 Dependence on renal dialysis: Secondary | ICD-10-CM | POA: Diagnosis not present

## 2019-08-12 DIAGNOSIS — N2581 Secondary hyperparathyroidism of renal origin: Secondary | ICD-10-CM | POA: Diagnosis not present

## 2019-08-12 DIAGNOSIS — D509 Iron deficiency anemia, unspecified: Secondary | ICD-10-CM | POA: Diagnosis not present

## 2019-08-12 DIAGNOSIS — Z992 Dependence on renal dialysis: Secondary | ICD-10-CM | POA: Diagnosis not present

## 2019-08-12 DIAGNOSIS — N186 End stage renal disease: Secondary | ICD-10-CM | POA: Diagnosis not present

## 2019-08-12 DIAGNOSIS — Z23 Encounter for immunization: Secondary | ICD-10-CM | POA: Diagnosis not present

## 2019-08-13 DIAGNOSIS — G894 Chronic pain syndrome: Secondary | ICD-10-CM | POA: Diagnosis not present

## 2019-08-13 DIAGNOSIS — Z79891 Long term (current) use of opiate analgesic: Secondary | ICD-10-CM | POA: Diagnosis not present

## 2019-08-14 DIAGNOSIS — Z992 Dependence on renal dialysis: Secondary | ICD-10-CM | POA: Diagnosis not present

## 2019-08-14 DIAGNOSIS — D509 Iron deficiency anemia, unspecified: Secondary | ICD-10-CM | POA: Diagnosis not present

## 2019-08-14 DIAGNOSIS — N2581 Secondary hyperparathyroidism of renal origin: Secondary | ICD-10-CM | POA: Diagnosis not present

## 2019-08-14 DIAGNOSIS — N186 End stage renal disease: Secondary | ICD-10-CM | POA: Diagnosis not present

## 2019-08-14 DIAGNOSIS — Z23 Encounter for immunization: Secondary | ICD-10-CM | POA: Diagnosis not present

## 2019-08-16 DIAGNOSIS — Z23 Encounter for immunization: Secondary | ICD-10-CM | POA: Diagnosis not present

## 2019-08-16 DIAGNOSIS — N186 End stage renal disease: Secondary | ICD-10-CM | POA: Diagnosis not present

## 2019-08-16 DIAGNOSIS — N2581 Secondary hyperparathyroidism of renal origin: Secondary | ICD-10-CM | POA: Diagnosis not present

## 2019-08-16 DIAGNOSIS — Z992 Dependence on renal dialysis: Secondary | ICD-10-CM | POA: Diagnosis not present

## 2019-08-16 DIAGNOSIS — D509 Iron deficiency anemia, unspecified: Secondary | ICD-10-CM | POA: Diagnosis not present

## 2019-08-17 DIAGNOSIS — N186 End stage renal disease: Secondary | ICD-10-CM | POA: Diagnosis not present

## 2019-08-17 DIAGNOSIS — Z992 Dependence on renal dialysis: Secondary | ICD-10-CM | POA: Diagnosis not present

## 2019-08-19 DIAGNOSIS — D509 Iron deficiency anemia, unspecified: Secondary | ICD-10-CM | POA: Diagnosis not present

## 2019-08-19 DIAGNOSIS — N2581 Secondary hyperparathyroidism of renal origin: Secondary | ICD-10-CM | POA: Diagnosis not present

## 2019-08-19 DIAGNOSIS — Z992 Dependence on renal dialysis: Secondary | ICD-10-CM | POA: Diagnosis not present

## 2019-08-19 DIAGNOSIS — N186 End stage renal disease: Secondary | ICD-10-CM | POA: Diagnosis not present

## 2019-08-21 DIAGNOSIS — N186 End stage renal disease: Secondary | ICD-10-CM | POA: Diagnosis not present

## 2019-08-21 DIAGNOSIS — Z992 Dependence on renal dialysis: Secondary | ICD-10-CM | POA: Diagnosis not present

## 2019-08-21 DIAGNOSIS — D509 Iron deficiency anemia, unspecified: Secondary | ICD-10-CM | POA: Diagnosis not present

## 2019-08-21 DIAGNOSIS — N2581 Secondary hyperparathyroidism of renal origin: Secondary | ICD-10-CM | POA: Diagnosis not present

## 2019-08-23 DIAGNOSIS — N186 End stage renal disease: Secondary | ICD-10-CM | POA: Diagnosis not present

## 2019-08-23 DIAGNOSIS — D509 Iron deficiency anemia, unspecified: Secondary | ICD-10-CM | POA: Diagnosis not present

## 2019-08-23 DIAGNOSIS — Z992 Dependence on renal dialysis: Secondary | ICD-10-CM | POA: Diagnosis not present

## 2019-08-23 DIAGNOSIS — N2581 Secondary hyperparathyroidism of renal origin: Secondary | ICD-10-CM | POA: Diagnosis not present

## 2019-08-26 DIAGNOSIS — N186 End stage renal disease: Secondary | ICD-10-CM | POA: Diagnosis not present

## 2019-08-26 DIAGNOSIS — D509 Iron deficiency anemia, unspecified: Secondary | ICD-10-CM | POA: Diagnosis not present

## 2019-08-26 DIAGNOSIS — Z992 Dependence on renal dialysis: Secondary | ICD-10-CM | POA: Diagnosis not present

## 2019-08-26 DIAGNOSIS — N2581 Secondary hyperparathyroidism of renal origin: Secondary | ICD-10-CM | POA: Diagnosis not present

## 2019-08-27 DIAGNOSIS — G894 Chronic pain syndrome: Secondary | ICD-10-CM | POA: Diagnosis not present

## 2019-08-27 DIAGNOSIS — M169 Osteoarthritis of hip, unspecified: Secondary | ICD-10-CM | POA: Diagnosis not present

## 2019-08-27 DIAGNOSIS — M25561 Pain in right knee: Secondary | ICD-10-CM | POA: Diagnosis not present

## 2019-08-27 DIAGNOSIS — T8789 Other complications of amputation stump: Secondary | ICD-10-CM | POA: Diagnosis not present

## 2019-08-28 DIAGNOSIS — D509 Iron deficiency anemia, unspecified: Secondary | ICD-10-CM | POA: Diagnosis not present

## 2019-08-28 DIAGNOSIS — N2581 Secondary hyperparathyroidism of renal origin: Secondary | ICD-10-CM | POA: Diagnosis not present

## 2019-08-28 DIAGNOSIS — Z992 Dependence on renal dialysis: Secondary | ICD-10-CM | POA: Diagnosis not present

## 2019-08-28 DIAGNOSIS — N186 End stage renal disease: Secondary | ICD-10-CM | POA: Diagnosis not present

## 2019-08-30 DIAGNOSIS — D509 Iron deficiency anemia, unspecified: Secondary | ICD-10-CM | POA: Diagnosis not present

## 2019-08-30 DIAGNOSIS — Z992 Dependence on renal dialysis: Secondary | ICD-10-CM | POA: Diagnosis not present

## 2019-08-30 DIAGNOSIS — N2581 Secondary hyperparathyroidism of renal origin: Secondary | ICD-10-CM | POA: Diagnosis not present

## 2019-08-30 DIAGNOSIS — N186 End stage renal disease: Secondary | ICD-10-CM | POA: Diagnosis not present

## 2019-09-02 DIAGNOSIS — N186 End stage renal disease: Secondary | ICD-10-CM | POA: Diagnosis not present

## 2019-09-02 DIAGNOSIS — Z992 Dependence on renal dialysis: Secondary | ICD-10-CM | POA: Diagnosis not present

## 2019-09-02 DIAGNOSIS — D509 Iron deficiency anemia, unspecified: Secondary | ICD-10-CM | POA: Diagnosis not present

## 2019-09-02 DIAGNOSIS — N2581 Secondary hyperparathyroidism of renal origin: Secondary | ICD-10-CM | POA: Diagnosis not present

## 2019-09-04 DIAGNOSIS — N186 End stage renal disease: Secondary | ICD-10-CM | POA: Diagnosis not present

## 2019-09-04 DIAGNOSIS — Z992 Dependence on renal dialysis: Secondary | ICD-10-CM | POA: Diagnosis not present

## 2019-09-04 DIAGNOSIS — N2581 Secondary hyperparathyroidism of renal origin: Secondary | ICD-10-CM | POA: Diagnosis not present

## 2019-09-04 DIAGNOSIS — D509 Iron deficiency anemia, unspecified: Secondary | ICD-10-CM | POA: Diagnosis not present

## 2019-09-07 DIAGNOSIS — N2581 Secondary hyperparathyroidism of renal origin: Secondary | ICD-10-CM | POA: Diagnosis not present

## 2019-09-07 DIAGNOSIS — D509 Iron deficiency anemia, unspecified: Secondary | ICD-10-CM | POA: Diagnosis not present

## 2019-09-07 DIAGNOSIS — N186 End stage renal disease: Secondary | ICD-10-CM | POA: Diagnosis not present

## 2019-09-07 DIAGNOSIS — Z992 Dependence on renal dialysis: Secondary | ICD-10-CM | POA: Diagnosis not present

## 2019-09-09 DIAGNOSIS — D509 Iron deficiency anemia, unspecified: Secondary | ICD-10-CM | POA: Diagnosis not present

## 2019-09-09 DIAGNOSIS — N2581 Secondary hyperparathyroidism of renal origin: Secondary | ICD-10-CM | POA: Diagnosis not present

## 2019-09-09 DIAGNOSIS — Z992 Dependence on renal dialysis: Secondary | ICD-10-CM | POA: Diagnosis not present

## 2019-09-09 DIAGNOSIS — N186 End stage renal disease: Secondary | ICD-10-CM | POA: Diagnosis not present

## 2019-09-11 DIAGNOSIS — N2581 Secondary hyperparathyroidism of renal origin: Secondary | ICD-10-CM | POA: Diagnosis not present

## 2019-09-11 DIAGNOSIS — Z992 Dependence on renal dialysis: Secondary | ICD-10-CM | POA: Diagnosis not present

## 2019-09-11 DIAGNOSIS — D509 Iron deficiency anemia, unspecified: Secondary | ICD-10-CM | POA: Diagnosis not present

## 2019-09-11 DIAGNOSIS — N186 End stage renal disease: Secondary | ICD-10-CM | POA: Diagnosis not present

## 2019-09-13 DIAGNOSIS — Z992 Dependence on renal dialysis: Secondary | ICD-10-CM | POA: Diagnosis not present

## 2019-09-13 DIAGNOSIS — N186 End stage renal disease: Secondary | ICD-10-CM | POA: Diagnosis not present

## 2019-09-13 DIAGNOSIS — N2581 Secondary hyperparathyroidism of renal origin: Secondary | ICD-10-CM | POA: Diagnosis not present

## 2019-09-13 DIAGNOSIS — D509 Iron deficiency anemia, unspecified: Secondary | ICD-10-CM | POA: Diagnosis not present

## 2019-09-16 DIAGNOSIS — Z992 Dependence on renal dialysis: Secondary | ICD-10-CM | POA: Diagnosis not present

## 2019-09-16 DIAGNOSIS — N186 End stage renal disease: Secondary | ICD-10-CM | POA: Diagnosis not present

## 2019-09-16 DIAGNOSIS — N2581 Secondary hyperparathyroidism of renal origin: Secondary | ICD-10-CM | POA: Diagnosis not present

## 2019-09-16 DIAGNOSIS — D509 Iron deficiency anemia, unspecified: Secondary | ICD-10-CM | POA: Diagnosis not present

## 2019-09-24 DIAGNOSIS — T8789 Other complications of amputation stump: Secondary | ICD-10-CM | POA: Diagnosis not present

## 2019-09-24 DIAGNOSIS — M169 Osteoarthritis of hip, unspecified: Secondary | ICD-10-CM | POA: Diagnosis not present

## 2019-09-24 DIAGNOSIS — M47816 Spondylosis without myelopathy or radiculopathy, lumbar region: Secondary | ICD-10-CM | POA: Diagnosis not present

## 2019-09-24 DIAGNOSIS — G894 Chronic pain syndrome: Secondary | ICD-10-CM | POA: Diagnosis not present

## 2019-09-25 DIAGNOSIS — N3000 Acute cystitis without hematuria: Secondary | ICD-10-CM | POA: Diagnosis not present

## 2019-10-18 DIAGNOSIS — Z992 Dependence on renal dialysis: Secondary | ICD-10-CM | POA: Diagnosis not present

## 2019-10-18 DIAGNOSIS — N186 End stage renal disease: Secondary | ICD-10-CM | POA: Diagnosis not present

## 2019-10-18 DIAGNOSIS — D509 Iron deficiency anemia, unspecified: Secondary | ICD-10-CM | POA: Diagnosis not present

## 2019-10-21 DIAGNOSIS — D509 Iron deficiency anemia, unspecified: Secondary | ICD-10-CM | POA: Diagnosis not present

## 2019-10-21 DIAGNOSIS — N186 End stage renal disease: Secondary | ICD-10-CM | POA: Diagnosis not present

## 2019-10-21 DIAGNOSIS — Z992 Dependence on renal dialysis: Secondary | ICD-10-CM | POA: Diagnosis not present

## 2019-10-23 DIAGNOSIS — N186 End stage renal disease: Secondary | ICD-10-CM | POA: Diagnosis not present

## 2019-10-23 DIAGNOSIS — Z992 Dependence on renal dialysis: Secondary | ICD-10-CM | POA: Diagnosis not present

## 2019-10-23 DIAGNOSIS — D509 Iron deficiency anemia, unspecified: Secondary | ICD-10-CM | POA: Diagnosis not present

## 2019-10-25 DIAGNOSIS — N186 End stage renal disease: Secondary | ICD-10-CM | POA: Diagnosis not present

## 2019-10-25 DIAGNOSIS — D509 Iron deficiency anemia, unspecified: Secondary | ICD-10-CM | POA: Diagnosis not present

## 2019-10-25 DIAGNOSIS — Z992 Dependence on renal dialysis: Secondary | ICD-10-CM | POA: Diagnosis not present

## 2019-10-28 DIAGNOSIS — D509 Iron deficiency anemia, unspecified: Secondary | ICD-10-CM | POA: Diagnosis not present

## 2019-10-28 DIAGNOSIS — N186 End stage renal disease: Secondary | ICD-10-CM | POA: Diagnosis not present

## 2019-10-28 DIAGNOSIS — Z992 Dependence on renal dialysis: Secondary | ICD-10-CM | POA: Diagnosis not present

## 2019-10-30 DIAGNOSIS — N186 End stage renal disease: Secondary | ICD-10-CM | POA: Diagnosis not present

## 2019-10-30 DIAGNOSIS — D509 Iron deficiency anemia, unspecified: Secondary | ICD-10-CM | POA: Diagnosis not present

## 2019-10-30 DIAGNOSIS — Z992 Dependence on renal dialysis: Secondary | ICD-10-CM | POA: Diagnosis not present

## 2019-11-01 DIAGNOSIS — Z992 Dependence on renal dialysis: Secondary | ICD-10-CM | POA: Diagnosis not present

## 2019-11-01 DIAGNOSIS — N186 End stage renal disease: Secondary | ICD-10-CM | POA: Diagnosis not present

## 2019-11-01 DIAGNOSIS — D509 Iron deficiency anemia, unspecified: Secondary | ICD-10-CM | POA: Diagnosis not present

## 2019-11-04 DIAGNOSIS — Z992 Dependence on renal dialysis: Secondary | ICD-10-CM | POA: Diagnosis not present

## 2019-11-04 DIAGNOSIS — D509 Iron deficiency anemia, unspecified: Secondary | ICD-10-CM | POA: Diagnosis not present

## 2019-11-04 DIAGNOSIS — N186 End stage renal disease: Secondary | ICD-10-CM | POA: Diagnosis not present

## 2019-11-06 DIAGNOSIS — D509 Iron deficiency anemia, unspecified: Secondary | ICD-10-CM | POA: Diagnosis not present

## 2019-11-06 DIAGNOSIS — Z992 Dependence on renal dialysis: Secondary | ICD-10-CM | POA: Diagnosis not present

## 2019-11-06 DIAGNOSIS — N186 End stage renal disease: Secondary | ICD-10-CM | POA: Diagnosis not present

## 2019-11-08 DIAGNOSIS — D509 Iron deficiency anemia, unspecified: Secondary | ICD-10-CM | POA: Diagnosis not present

## 2019-11-08 DIAGNOSIS — Z992 Dependence on renal dialysis: Secondary | ICD-10-CM | POA: Diagnosis not present

## 2019-11-08 DIAGNOSIS — N186 End stage renal disease: Secondary | ICD-10-CM | POA: Diagnosis not present

## 2019-11-11 DIAGNOSIS — Z992 Dependence on renal dialysis: Secondary | ICD-10-CM | POA: Diagnosis not present

## 2019-11-11 DIAGNOSIS — N186 End stage renal disease: Secondary | ICD-10-CM | POA: Diagnosis not present

## 2019-11-11 DIAGNOSIS — D509 Iron deficiency anemia, unspecified: Secondary | ICD-10-CM | POA: Diagnosis not present

## 2019-11-12 DIAGNOSIS — I25118 Atherosclerotic heart disease of native coronary artery with other forms of angina pectoris: Secondary | ICD-10-CM | POA: Diagnosis not present

## 2019-11-12 DIAGNOSIS — I10 Essential (primary) hypertension: Secondary | ICD-10-CM | POA: Diagnosis not present

## 2019-11-12 DIAGNOSIS — E1142 Type 2 diabetes mellitus with diabetic polyneuropathy: Secondary | ICD-10-CM | POA: Diagnosis not present

## 2019-11-13 DIAGNOSIS — Z992 Dependence on renal dialysis: Secondary | ICD-10-CM | POA: Diagnosis not present

## 2019-11-13 DIAGNOSIS — D509 Iron deficiency anemia, unspecified: Secondary | ICD-10-CM | POA: Diagnosis not present

## 2019-11-13 DIAGNOSIS — N186 End stage renal disease: Secondary | ICD-10-CM | POA: Diagnosis not present

## 2019-11-14 DIAGNOSIS — G894 Chronic pain syndrome: Secondary | ICD-10-CM | POA: Diagnosis not present

## 2019-11-14 DIAGNOSIS — Z79891 Long term (current) use of opiate analgesic: Secondary | ICD-10-CM | POA: Diagnosis not present

## 2019-11-15 DIAGNOSIS — Z992 Dependence on renal dialysis: Secondary | ICD-10-CM | POA: Diagnosis not present

## 2019-11-15 DIAGNOSIS — D509 Iron deficiency anemia, unspecified: Secondary | ICD-10-CM | POA: Diagnosis not present

## 2019-11-15 DIAGNOSIS — N186 End stage renal disease: Secondary | ICD-10-CM | POA: Diagnosis not present

## 2019-11-17 DIAGNOSIS — Z992 Dependence on renal dialysis: Secondary | ICD-10-CM | POA: Diagnosis not present

## 2019-11-17 DIAGNOSIS — N186 End stage renal disease: Secondary | ICD-10-CM | POA: Diagnosis not present

## 2019-11-18 DIAGNOSIS — Z992 Dependence on renal dialysis: Secondary | ICD-10-CM | POA: Diagnosis not present

## 2019-11-18 DIAGNOSIS — N186 End stage renal disease: Secondary | ICD-10-CM | POA: Diagnosis not present

## 2019-11-19 DIAGNOSIS — M47816 Spondylosis without myelopathy or radiculopathy, lumbar region: Secondary | ICD-10-CM | POA: Diagnosis not present

## 2019-11-19 DIAGNOSIS — G894 Chronic pain syndrome: Secondary | ICD-10-CM | POA: Diagnosis not present

## 2019-11-19 DIAGNOSIS — T8789 Other complications of amputation stump: Secondary | ICD-10-CM | POA: Diagnosis not present

## 2019-11-19 DIAGNOSIS — M169 Osteoarthritis of hip, unspecified: Secondary | ICD-10-CM | POA: Diagnosis not present

## 2019-11-20 DIAGNOSIS — N186 End stage renal disease: Secondary | ICD-10-CM | POA: Diagnosis not present

## 2019-11-20 DIAGNOSIS — Z992 Dependence on renal dialysis: Secondary | ICD-10-CM | POA: Diagnosis not present

## 2019-11-22 DIAGNOSIS — N186 End stage renal disease: Secondary | ICD-10-CM | POA: Diagnosis not present

## 2019-11-22 DIAGNOSIS — Z992 Dependence on renal dialysis: Secondary | ICD-10-CM | POA: Diagnosis not present

## 2019-11-25 DIAGNOSIS — E1142 Type 2 diabetes mellitus with diabetic polyneuropathy: Secondary | ICD-10-CM | POA: Diagnosis not present

## 2019-11-25 DIAGNOSIS — I25118 Atherosclerotic heart disease of native coronary artery with other forms of angina pectoris: Secondary | ICD-10-CM | POA: Diagnosis not present

## 2019-11-25 DIAGNOSIS — I10 Essential (primary) hypertension: Secondary | ICD-10-CM | POA: Diagnosis not present

## 2019-11-25 DIAGNOSIS — Z992 Dependence on renal dialysis: Secondary | ICD-10-CM | POA: Diagnosis not present

## 2019-11-25 DIAGNOSIS — N186 End stage renal disease: Secondary | ICD-10-CM | POA: Diagnosis not present

## 2019-11-27 DIAGNOSIS — N186 End stage renal disease: Secondary | ICD-10-CM | POA: Diagnosis not present

## 2019-11-27 DIAGNOSIS — Z992 Dependence on renal dialysis: Secondary | ICD-10-CM | POA: Diagnosis not present

## 2019-11-29 DIAGNOSIS — N186 End stage renal disease: Secondary | ICD-10-CM | POA: Diagnosis not present

## 2019-11-29 DIAGNOSIS — Z992 Dependence on renal dialysis: Secondary | ICD-10-CM | POA: Diagnosis not present

## 2019-12-02 DIAGNOSIS — Z992 Dependence on renal dialysis: Secondary | ICD-10-CM | POA: Diagnosis not present

## 2019-12-02 DIAGNOSIS — N186 End stage renal disease: Secondary | ICD-10-CM | POA: Diagnosis not present

## 2019-12-04 DIAGNOSIS — Z992 Dependence on renal dialysis: Secondary | ICD-10-CM | POA: Diagnosis not present

## 2019-12-04 DIAGNOSIS — N186 End stage renal disease: Secondary | ICD-10-CM | POA: Diagnosis not present

## 2019-12-07 DIAGNOSIS — N186 End stage renal disease: Secondary | ICD-10-CM | POA: Diagnosis not present

## 2019-12-07 DIAGNOSIS — Z992 Dependence on renal dialysis: Secondary | ICD-10-CM | POA: Diagnosis not present

## 2019-12-09 DIAGNOSIS — Z992 Dependence on renal dialysis: Secondary | ICD-10-CM | POA: Diagnosis not present

## 2019-12-09 DIAGNOSIS — N186 End stage renal disease: Secondary | ICD-10-CM | POA: Diagnosis not present

## 2019-12-11 DIAGNOSIS — N186 End stage renal disease: Secondary | ICD-10-CM | POA: Diagnosis not present

## 2019-12-11 DIAGNOSIS — Z992 Dependence on renal dialysis: Secondary | ICD-10-CM | POA: Diagnosis not present

## 2019-12-13 DIAGNOSIS — Z992 Dependence on renal dialysis: Secondary | ICD-10-CM | POA: Diagnosis not present

## 2019-12-13 DIAGNOSIS — N186 End stage renal disease: Secondary | ICD-10-CM | POA: Diagnosis not present

## 2019-12-15 DIAGNOSIS — Z992 Dependence on renal dialysis: Secondary | ICD-10-CM | POA: Diagnosis not present

## 2019-12-15 DIAGNOSIS — N186 End stage renal disease: Secondary | ICD-10-CM | POA: Diagnosis not present

## 2019-12-16 DIAGNOSIS — N2581 Secondary hyperparathyroidism of renal origin: Secondary | ICD-10-CM | POA: Diagnosis not present

## 2019-12-16 DIAGNOSIS — D509 Iron deficiency anemia, unspecified: Secondary | ICD-10-CM | POA: Diagnosis not present

## 2019-12-16 DIAGNOSIS — Z992 Dependence on renal dialysis: Secondary | ICD-10-CM | POA: Diagnosis not present

## 2019-12-16 DIAGNOSIS — N186 End stage renal disease: Secondary | ICD-10-CM | POA: Diagnosis not present

## 2019-12-18 DIAGNOSIS — D509 Iron deficiency anemia, unspecified: Secondary | ICD-10-CM | POA: Diagnosis not present

## 2019-12-18 DIAGNOSIS — N186 End stage renal disease: Secondary | ICD-10-CM | POA: Diagnosis not present

## 2019-12-18 DIAGNOSIS — Z992 Dependence on renal dialysis: Secondary | ICD-10-CM | POA: Diagnosis not present

## 2019-12-18 DIAGNOSIS — N2581 Secondary hyperparathyroidism of renal origin: Secondary | ICD-10-CM | POA: Diagnosis not present

## 2019-12-21 DIAGNOSIS — N186 End stage renal disease: Secondary | ICD-10-CM | POA: Diagnosis not present

## 2019-12-21 DIAGNOSIS — D509 Iron deficiency anemia, unspecified: Secondary | ICD-10-CM | POA: Diagnosis not present

## 2019-12-21 DIAGNOSIS — Z992 Dependence on renal dialysis: Secondary | ICD-10-CM | POA: Diagnosis not present

## 2019-12-21 DIAGNOSIS — N2581 Secondary hyperparathyroidism of renal origin: Secondary | ICD-10-CM | POA: Diagnosis not present

## 2019-12-23 DIAGNOSIS — N186 End stage renal disease: Secondary | ICD-10-CM | POA: Diagnosis not present

## 2019-12-23 DIAGNOSIS — Z992 Dependence on renal dialysis: Secondary | ICD-10-CM | POA: Diagnosis not present

## 2019-12-23 DIAGNOSIS — N2581 Secondary hyperparathyroidism of renal origin: Secondary | ICD-10-CM | POA: Diagnosis not present

## 2019-12-23 DIAGNOSIS — D509 Iron deficiency anemia, unspecified: Secondary | ICD-10-CM | POA: Diagnosis not present

## 2019-12-23 DIAGNOSIS — Z23 Encounter for immunization: Secondary | ICD-10-CM | POA: Diagnosis not present

## 2019-12-24 DIAGNOSIS — J4531 Mild persistent asthma with (acute) exacerbation: Secondary | ICD-10-CM | POA: Diagnosis not present

## 2019-12-24 DIAGNOSIS — I10 Essential (primary) hypertension: Secondary | ICD-10-CM | POA: Diagnosis not present

## 2019-12-24 DIAGNOSIS — N185 Chronic kidney disease, stage 5: Secondary | ICD-10-CM | POA: Diagnosis not present

## 2019-12-24 DIAGNOSIS — E1121 Type 2 diabetes mellitus with diabetic nephropathy: Secondary | ICD-10-CM | POA: Diagnosis not present

## 2019-12-25 DIAGNOSIS — N186 End stage renal disease: Secondary | ICD-10-CM | POA: Diagnosis not present

## 2019-12-25 DIAGNOSIS — D509 Iron deficiency anemia, unspecified: Secondary | ICD-10-CM | POA: Diagnosis not present

## 2019-12-25 DIAGNOSIS — Z992 Dependence on renal dialysis: Secondary | ICD-10-CM | POA: Diagnosis not present

## 2019-12-25 DIAGNOSIS — N2581 Secondary hyperparathyroidism of renal origin: Secondary | ICD-10-CM | POA: Diagnosis not present

## 2019-12-26 DIAGNOSIS — M169 Osteoarthritis of hip, unspecified: Secondary | ICD-10-CM | POA: Diagnosis not present

## 2019-12-26 DIAGNOSIS — G894 Chronic pain syndrome: Secondary | ICD-10-CM | POA: Diagnosis not present

## 2019-12-26 DIAGNOSIS — M47816 Spondylosis without myelopathy or radiculopathy, lumbar region: Secondary | ICD-10-CM | POA: Diagnosis not present

## 2019-12-26 DIAGNOSIS — T8789 Other complications of amputation stump: Secondary | ICD-10-CM | POA: Diagnosis not present

## 2019-12-27 ENCOUNTER — Telehealth: Payer: Self-pay

## 2019-12-27 DIAGNOSIS — N2581 Secondary hyperparathyroidism of renal origin: Secondary | ICD-10-CM | POA: Diagnosis not present

## 2019-12-27 DIAGNOSIS — N186 End stage renal disease: Secondary | ICD-10-CM | POA: Diagnosis not present

## 2019-12-27 DIAGNOSIS — D509 Iron deficiency anemia, unspecified: Secondary | ICD-10-CM | POA: Diagnosis not present

## 2019-12-27 DIAGNOSIS — Z992 Dependence on renal dialysis: Secondary | ICD-10-CM | POA: Diagnosis not present

## 2019-12-27 NOTE — Telephone Encounter (Signed)
Referral rec'vd Ramiro Harvest, PA w/ indciation of bleeding around needle site before and during tx - To shannon to schedule fistulagram

## 2019-12-30 ENCOUNTER — Other Ambulatory Visit: Payer: Self-pay

## 2019-12-30 DIAGNOSIS — D509 Iron deficiency anemia, unspecified: Secondary | ICD-10-CM | POA: Diagnosis not present

## 2019-12-30 DIAGNOSIS — Z992 Dependence on renal dialysis: Secondary | ICD-10-CM | POA: Diagnosis not present

## 2019-12-30 DIAGNOSIS — N2581 Secondary hyperparathyroidism of renal origin: Secondary | ICD-10-CM | POA: Diagnosis not present

## 2019-12-30 DIAGNOSIS — N186 End stage renal disease: Secondary | ICD-10-CM | POA: Diagnosis not present

## 2020-01-01 DIAGNOSIS — N186 End stage renal disease: Secondary | ICD-10-CM | POA: Diagnosis not present

## 2020-01-01 DIAGNOSIS — N2581 Secondary hyperparathyroidism of renal origin: Secondary | ICD-10-CM | POA: Diagnosis not present

## 2020-01-01 DIAGNOSIS — Z992 Dependence on renal dialysis: Secondary | ICD-10-CM | POA: Diagnosis not present

## 2020-01-01 DIAGNOSIS — D509 Iron deficiency anemia, unspecified: Secondary | ICD-10-CM | POA: Diagnosis not present

## 2020-01-03 DIAGNOSIS — N2581 Secondary hyperparathyroidism of renal origin: Secondary | ICD-10-CM | POA: Diagnosis not present

## 2020-01-03 DIAGNOSIS — N186 End stage renal disease: Secondary | ICD-10-CM | POA: Diagnosis not present

## 2020-01-03 DIAGNOSIS — Z992 Dependence on renal dialysis: Secondary | ICD-10-CM | POA: Diagnosis not present

## 2020-01-03 DIAGNOSIS — D509 Iron deficiency anemia, unspecified: Secondary | ICD-10-CM | POA: Diagnosis not present

## 2020-01-06 DIAGNOSIS — E109 Type 1 diabetes mellitus without complications: Secondary | ICD-10-CM | POA: Diagnosis not present

## 2020-01-06 DIAGNOSIS — Z992 Dependence on renal dialysis: Secondary | ICD-10-CM | POA: Diagnosis not present

## 2020-01-06 DIAGNOSIS — N186 End stage renal disease: Secondary | ICD-10-CM | POA: Diagnosis not present

## 2020-01-06 DIAGNOSIS — N2581 Secondary hyperparathyroidism of renal origin: Secondary | ICD-10-CM | POA: Diagnosis not present

## 2020-01-06 DIAGNOSIS — Z794 Long term (current) use of insulin: Secondary | ICD-10-CM | POA: Diagnosis not present

## 2020-01-06 DIAGNOSIS — D509 Iron deficiency anemia, unspecified: Secondary | ICD-10-CM | POA: Diagnosis not present

## 2020-01-08 DIAGNOSIS — N2581 Secondary hyperparathyroidism of renal origin: Secondary | ICD-10-CM | POA: Diagnosis not present

## 2020-01-08 DIAGNOSIS — N186 End stage renal disease: Secondary | ICD-10-CM | POA: Diagnosis not present

## 2020-01-08 DIAGNOSIS — Z992 Dependence on renal dialysis: Secondary | ICD-10-CM | POA: Diagnosis not present

## 2020-01-08 DIAGNOSIS — D509 Iron deficiency anemia, unspecified: Secondary | ICD-10-CM | POA: Diagnosis not present

## 2020-01-10 DIAGNOSIS — N2581 Secondary hyperparathyroidism of renal origin: Secondary | ICD-10-CM | POA: Diagnosis not present

## 2020-01-10 DIAGNOSIS — D509 Iron deficiency anemia, unspecified: Secondary | ICD-10-CM | POA: Diagnosis not present

## 2020-01-10 DIAGNOSIS — Z992 Dependence on renal dialysis: Secondary | ICD-10-CM | POA: Diagnosis not present

## 2020-01-10 DIAGNOSIS — N186 End stage renal disease: Secondary | ICD-10-CM | POA: Diagnosis not present

## 2020-01-13 DIAGNOSIS — N186 End stage renal disease: Secondary | ICD-10-CM | POA: Diagnosis not present

## 2020-01-13 DIAGNOSIS — N2581 Secondary hyperparathyroidism of renal origin: Secondary | ICD-10-CM | POA: Diagnosis not present

## 2020-01-13 DIAGNOSIS — D509 Iron deficiency anemia, unspecified: Secondary | ICD-10-CM | POA: Diagnosis not present

## 2020-01-13 DIAGNOSIS — Z992 Dependence on renal dialysis: Secondary | ICD-10-CM | POA: Diagnosis not present

## 2020-01-15 DIAGNOSIS — Z992 Dependence on renal dialysis: Secondary | ICD-10-CM | POA: Diagnosis not present

## 2020-01-15 DIAGNOSIS — N186 End stage renal disease: Secondary | ICD-10-CM | POA: Diagnosis not present

## 2020-01-15 DIAGNOSIS — N2581 Secondary hyperparathyroidism of renal origin: Secondary | ICD-10-CM | POA: Diagnosis not present

## 2020-01-15 DIAGNOSIS — D509 Iron deficiency anemia, unspecified: Secondary | ICD-10-CM | POA: Diagnosis not present

## 2020-01-17 DIAGNOSIS — Z992 Dependence on renal dialysis: Secondary | ICD-10-CM | POA: Diagnosis not present

## 2020-01-17 DIAGNOSIS — D509 Iron deficiency anemia, unspecified: Secondary | ICD-10-CM | POA: Diagnosis not present

## 2020-01-17 DIAGNOSIS — N186 End stage renal disease: Secondary | ICD-10-CM | POA: Diagnosis not present

## 2020-01-20 DIAGNOSIS — N186 End stage renal disease: Secondary | ICD-10-CM | POA: Diagnosis not present

## 2020-01-20 DIAGNOSIS — D509 Iron deficiency anemia, unspecified: Secondary | ICD-10-CM | POA: Diagnosis not present

## 2020-01-20 DIAGNOSIS — Z992 Dependence on renal dialysis: Secondary | ICD-10-CM | POA: Diagnosis not present

## 2020-01-21 DIAGNOSIS — Z23 Encounter for immunization: Secondary | ICD-10-CM | POA: Diagnosis not present

## 2020-01-22 DIAGNOSIS — D509 Iron deficiency anemia, unspecified: Secondary | ICD-10-CM | POA: Diagnosis not present

## 2020-01-22 DIAGNOSIS — Z992 Dependence on renal dialysis: Secondary | ICD-10-CM | POA: Diagnosis not present

## 2020-01-22 DIAGNOSIS — N186 End stage renal disease: Secondary | ICD-10-CM | POA: Diagnosis not present

## 2020-01-24 DIAGNOSIS — D509 Iron deficiency anemia, unspecified: Secondary | ICD-10-CM | POA: Diagnosis not present

## 2020-01-24 DIAGNOSIS — Z992 Dependence on renal dialysis: Secondary | ICD-10-CM | POA: Diagnosis not present

## 2020-01-24 DIAGNOSIS — N186 End stage renal disease: Secondary | ICD-10-CM | POA: Diagnosis not present

## 2020-01-27 ENCOUNTER — Telehealth: Payer: Self-pay

## 2020-01-27 DIAGNOSIS — D509 Iron deficiency anemia, unspecified: Secondary | ICD-10-CM | POA: Diagnosis not present

## 2020-01-27 DIAGNOSIS — N186 End stage renal disease: Secondary | ICD-10-CM | POA: Diagnosis not present

## 2020-01-27 DIAGNOSIS — Z992 Dependence on renal dialysis: Secondary | ICD-10-CM | POA: Diagnosis not present

## 2020-01-27 NOTE — Telephone Encounter (Signed)
Spoke to Kinnelon at [facility] in [place] regarding pt upcoming surgery. Pt has decided to cancel her 4/15 fistulogram.

## 2020-01-29 DIAGNOSIS — N186 End stage renal disease: Secondary | ICD-10-CM | POA: Diagnosis not present

## 2020-01-29 DIAGNOSIS — Z992 Dependence on renal dialysis: Secondary | ICD-10-CM | POA: Diagnosis not present

## 2020-01-29 DIAGNOSIS — D509 Iron deficiency anemia, unspecified: Secondary | ICD-10-CM | POA: Diagnosis not present

## 2020-01-30 ENCOUNTER — Encounter (HOSPITAL_COMMUNITY): Payer: Self-pay

## 2020-01-30 ENCOUNTER — Ambulatory Visit (HOSPITAL_COMMUNITY): Admit: 2020-01-30 | Payer: Medicare Other | Admitting: Vascular Surgery

## 2020-01-30 SURGERY — A/V FISTULAGRAM
Anesthesia: LOCAL

## 2020-01-31 DIAGNOSIS — D509 Iron deficiency anemia, unspecified: Secondary | ICD-10-CM | POA: Diagnosis not present

## 2020-01-31 DIAGNOSIS — Z992 Dependence on renal dialysis: Secondary | ICD-10-CM | POA: Diagnosis not present

## 2020-01-31 DIAGNOSIS — N186 End stage renal disease: Secondary | ICD-10-CM | POA: Diagnosis not present

## 2020-02-02 ENCOUNTER — Other Ambulatory Visit: Payer: Self-pay | Admitting: Student

## 2020-02-03 DIAGNOSIS — N186 End stage renal disease: Secondary | ICD-10-CM | POA: Diagnosis not present

## 2020-02-03 DIAGNOSIS — D509 Iron deficiency anemia, unspecified: Secondary | ICD-10-CM | POA: Diagnosis not present

## 2020-02-03 DIAGNOSIS — Z992 Dependence on renal dialysis: Secondary | ICD-10-CM | POA: Diagnosis not present

## 2020-02-03 NOTE — Progress Notes (Deleted)
Cardiology Office Note  Date: 02/03/2020   ID: [name], DOB 1971-03-10, MRN [id number]  PCP:  Neale Burly, MD  Cardiologist:  Kate Sable, MD Electrophysiologist:  None   Chief Complaint: F/U CAD, HTN, PVD, HLD,  History of Present Illness: Angela Soto is a 49 y.o. female with a history of  CAD, HTN, PVD, HLD, DM II, Bilateral BKA's.  Last seen by Dr. Bronson Ing 05/02/2019 via telemedicine.  Patient was deemed not suitable candidate for CABG and there were no good options for percutaneous revascularization.  She was symptomatically stable.  Her blood pressure was mildly elevated.  She was continuing her statin therapy.  She was continuing her hemodialysis.  She was being followed by vascular surgery secondary to bilateral below the knee amputations and debridement of wound on her left leg in July of last year.  She was having a lot of pain from her surgery and seeing a pain specialist.  Past Medical History:  Diagnosis Date  . Coronary artery disease    a. s/p prior PCI in 1998 b. 10/2017: cath showing severe two-vessel CAD with heavy calcification along the LCx and 100% CTO of RCA with left to right collaterals noted. No good options for PCI. Medical management recommended.   . Depression   . Environmental allergies    uses inhalers  . ESRD (end stage renal disease) on dialysis [facility])    "started dialysis on 10/08/13; MWF; [facility]; [place], [place]"  . GERD (gastroesophageal reflux disease)   . DUKGURKY(706.2)    "weekly" (11/09/2017)  . High cholesterol   . Hypertension   . Hypothyroidism   . Multiple sclerosis ([place])    just diagnosed 2014  . Myocardial infarction ([place]) 1998  . Nonalcoholic steatohepatitis ([facility])   . Peripheral vascular disease ([place])    aortic artery occlusion  . PONV (postoperative nausea and vomiting)   . Thyroid cancer ([place])   . Type 1 diabetes mellitus ([place])   . Umbilical hernia     Past Surgical History:  Procedure Laterality Date  .  ABDOMINAL SURGERY  2006   aortic artery occluded, had to "clean it out"  . ABOVE KNEE LEG AMPUTATION Left 05/21/2019  . AMPUTATION Left 09/22/2014   Procedure: AMPUTATION LEFT FOURTH FINGER;  Surgeon: Dayna Barker, MD;  Location: [place];  Service: Plastics;  Laterality: Left;  . AMPUTATION Left 04/25/2019   Procedure: DEBRIDEMENT BELOW KNEE AMPUTATION;  Surgeon: Waynetta Sandy, MD;  Location: [place];  Service: Vascular;  Laterality: Left;  . AMPUTATION Left 05/21/2019   Procedure: AMPUTATION ABOVE LEFT KNEE;  Surgeon: Waynetta Sandy, MD;  Location: [place];  Service: Vascular;  Laterality: Left;  . AV FISTULA PLACEMENT Left 09/24/2013   Procedure: LEFT UPPER EXTREMITY ARTERIOVENOUS (AV) FISTULA CREATION BRACHIAL/CEPHALIC;  Surgeon: Elam Dutch, MD;  Location: [place];  Service: Vascular;  Laterality: Left;  . BASCILIC VEIN TRANSPOSITION Left 02/18/2014   Procedure: BASILIC VEIN TRANSPOSITION - 2ND STAGE LEFT ARM;  Surgeon: Elam Dutch, MD;  Location: [place];  Service: Vascular;  Laterality: Left;  . CARDIAC CATHETERIZATION     "numerous"  . CARDIAC CATHETERIZATION  11/09/2017  . CORONARY ANGIOPLASTY    . CORONARY ANGIOPLASTY WITH STENT PLACEMENT     "think I have a total of 3 stents" (11/09/2017)  . INSERTION OF DIALYSIS CATHETER Right 10/07/2013   Procedure: INSERTION OF DIALYSIS CATHETER;  Surgeon: Conrad [place], MD;  Location: [place];  Service: Vascular;  Laterality:  Right;  Marland Kitchen IRRIGATION AND DEBRIDEMENT ABSCESS Right   . LEFT HEART CATH AND CORONARY ANGIOGRAPHY N/A 11/09/2017   Procedure: LEFT HEART CATH AND CORONARY ANGIOGRAPHY;  Surgeon: Burnell Blanks, MD;  Location: [place] [facility];  Service: Cardiovascular;  Laterality: N/A;  . LEG AMPUTATION BELOW KNEE Bilateral 2008-2010   left-right  . PARATHYROIDECTOMY    . SHUNTOGRAM Left 04/17/2014   Procedure: FISTULOGRAM;  Surgeon: Conrad Gardners, MD;  Location: [facility] CATH LAB;  Service: Cardiovascular;  Laterality:  Left;  . THYROIDECTOMY      Current Outpatient Medications  Medication Sig Dispense Refill  . aspirin EC 81 MG tablet Take 1 tablet (81 mg total) by mouth daily. (Patient taking differently: Take 81 mg by mouth 2 (two) times a day. )    . calcium acetate (PHOSLO) 667 MG capsule Take 1,334-2,668 mg by mouth See admin instructions. Take 4 capsules (2668 mg) by mouth with meals and take 2 capsules (1334 mg) by mouth with snacks.    . clopidogrel (PLAVIX) 75 MG tablet Take 75 mg by mouth every evening.     . famotidine (PEPCID) 40 MG tablet Take 40 mg by mouth every evening.    . folic acid-vitamin b complex-vitamin c-selenium-zinc (DIALYVITE) 3 MG TABS tablet Take 1 tablet by mouth daily.    . furosemide (LASIX) 80 MG tablet Take 80 mg by mouth 2 (two) times a day.    . gabapentin (NEURONTIN) 100 MG capsule Take 100-200 mg by mouth 3 (three) times daily as needed (pain. (scheduled at night before bedtime)).    Marland Kitchen insulin glargine (LANTUS) 100 UNIT/ML injection Inject 31-62 Units into the skin at bedtime as needed (for blood sugars greater than 150).     . insulin lispro (HUMALOG) 100 UNIT/ML injection Inject 12 Units into the skin 3 (three) times daily as needed for high blood sugar (blood sugars greater than 130).     . isosorbide mononitrate (IMDUR) 60 MG 24 hr tablet Take 60 mg in the am, and 30 mg ( 1/2 tablet) in the pm (Patient taking differently: Take 90 mg by mouth every evening. ) 135 tablet 3  . levothyroxine (SYNTHROID) 200 MCG tablet Take 200 mcg by mouth daily before breakfast.    . lidocaine-prilocaine (EMLA) cream Apply 1 application topically as needed (for pain/ apply prior to dialysis).    . metoprolol succinate (TOPROL-XL) 100 MG 24 hr tablet Take 100 mg by mouth 2 (two) times a day.     . Omega-3 Fatty Acids (FISH OIL) 1000 MG CAPS Take 1,000 mg by mouth 2 (two) times a day.     . oxyCODONE-acetaminophen (PERCOCET) 10-325 MG tablet Take 1 tablet by mouth 5 (five) times daily.      . rosuvastatin (CRESTOR) 10 MG tablet TAKE 1 TABLET BY MOUTH DAILY 90 tablet 3  . topiramate (TOPAMAX) 50 MG tablet Take 50 mg by mouth daily as needed (headaches.).     No current facility-administered medications for this visit.   Allergies:  Metformin and related and Chantix [varenicline]   Social History: The patient  reports that she has been smoking cigarettes. She has a 16.00 pack-year smoking history. She has never used smokeless tobacco. She reports that she does not drink alcohol or use drugs.   Family History: The patient's family history includes CAD (age of onset: 5) in her father; Diabetes Mellitus II in her father and mother; Ovarian cancer in her mother.   ROS:  Please see the  history of present illness. Otherwise, complete review of systems is positive for {NONE DEFAULTED:18576::"none"}.  All other systems are reviewed and negative.   Physical Exam: VS:  LMP 05/24/2013 , BMI There is no height or weight on file to calculate BMI.  Wt Readings from Last 3 Encounters:  06/21/19 202 lb (91.6 kg)  05/27/19 202 lb 13.2 oz (92 kg)  05/22/19 205 lb 4 oz (93.1 kg)    General: Patient appears comfortable at rest. HEENT: Conjunctiva and lids normal, oropharynx clear with moist mucosa. Neck: Supple, no elevated JVP or carotid bruits, no thyromegaly. Lungs: Clear to auscultation, nonlabored breathing at rest. Cardiac: Regular rate and rhythm, no S3 or significant systolic murmur, no pericardial rub. Abdomen: Soft, nontender, no hepatomegaly, bowel sounds present, no guarding or rebound. Extremities: No pitting edema, distal pulses 2+. Skin: Warm and dry. Musculoskeletal: No kyphosis. Neuropsychiatric: Alert and oriented x3, affect grossly appropriate.  ECG:  {EKG/Telemetry Strips Reviewed:[phone]}  Recent Labwork: 04/25/2019: ALT 19; AST 24 05/23/2019: BUN 26; Creatinine, Ser 4.91; Hemoglobin 11.0; Platelets 243; Potassium 3.6; Sodium 134     Component Value Date/Time    CHOL 185 11/10/2017 0527   TRIG 295 (H) 11/10/2017 0527   HDL 24 (L) 11/10/2017 0527   CHOLHDL 7.7 11/10/2017 0527   VLDL 59 (H) 11/10/2017 0527   LDLCALC 102 (H) 11/10/2017 0527    Other Studies Reviewed Today: Cardiac Catheterization: 11/09/2017  Mid RCA to Dist RCA lesion is 100% stenosed.  1. Severe double vessel CAD 2. The LAD is a large caliber vessel that courses to the apex. The mid vessel has a long segment of moderate, calcific stenosis. This does not appear to be flow limiting. The first diagonal is a small caliber vessel with moderate ostial stenosis. The second diagonal branch is a small caliber vessel with mild plaque.  3. The Circumflex is a moderate caliber, heavily calcified vessel from the ostium down into the obtuse marginal branches. There is moderately severe, heavily calcified stenosis from the proximal vessel down into the most distal obtuse marginal branch. The obtuse marginal branch is completely occluded. It appears that the distal segment of this branch fills from bridging collaterals. The vessel is small in caliber after it reconstitutes.  4. The RCA is a large dominant vessel with 100% occlusion just beyond the ostium. The distal vessel and distal branches are seen to fill from left to right collaterals supplied by septal perforators.  5. LV systolic function is overall preserved with segmental wall motion abnormality. LVEF estimated around 45-50%.   Recommendations: She has complex CAD with no good options for revascularization. Her RCA is chronically occluded and fills from collaterals. The entire Circumflex is diffusely diseased and heavily calcified with a distal chronic total occlusion of the OM branch. I would not approach this vessel with PCI. If PCI of the Circumflex were considered, we would have to perform atherectomy of the entire vessel and likely could not cross the distal CTO. The entire vessel would need to be stented and the risk of restenosis would  be very high in this patient with diabetes and ongoing tobacco abuse. She is not a candidate for CABG given her comorbid conditions including ESRD and the lack of graft conduit given bilateral lower extremity amputations. At this time, I would attempt to control her symptoms with medical therapy. She should consider stopping smoking as well.  Echocardiogram: 10/2017 Study Conclusions  - Left ventricle: The cavity size was normal. Wall thickness was increased in a  pattern of mild LVH. Systolic function was normal. The estimated ejection fraction was in the range of 50% to 55%. There is hypokinesis of the inferolateral myocardium. Features are consistent with a pseudonormal left ventricular filling pattern, with concomitant abnormal relaxation and increased filling pressure (grade 2 diastolic dysfunction). Doppler parameters are consistent with high ventricular filling pressure. - Mitral valve: Calcified annulus. - Left atrium: The atrium was severely dilated.  Impressions:  - Hypokinesis of the inferolateral wall with overall low normal LV systolic function; mild LVH; moderate diastolic dysfunction; severe LAE.  Assessment and Plan:  1. CAD in native artery   2. Essential hypertension   3. Hyperlipidemia LDL goal <70   4. ESRD (end stage renal disease) on dialysis ([place])   5. PVD (peripheral vascular disease) ([place])      Medication Adjustments/Labs and Tests Ordered: Current medicines are reviewed at length with the patient today.  Concerns regarding medicines are outlined above.   Disposition: Follow-up with ***  Signed, Levell July, NP 02/03/2020 10:20 PM    [place] [facility] [place], [place], [place] [address] Phone: [phone]; Fax: [phone]

## 2020-02-04 ENCOUNTER — Ambulatory Visit: Payer: Medicare Other | Admitting: Family Medicine

## 2020-02-04 ENCOUNTER — Other Ambulatory Visit: Payer: Self-pay | Admitting: Student

## 2020-02-04 DIAGNOSIS — I10 Essential (primary) hypertension: Secondary | ICD-10-CM

## 2020-02-04 DIAGNOSIS — I2510 Atherosclerotic heart disease of native coronary artery without angina pectoris: Secondary | ICD-10-CM

## 2020-02-04 DIAGNOSIS — I739 Peripheral vascular disease, unspecified: Secondary | ICD-10-CM

## 2020-02-04 DIAGNOSIS — N186 End stage renal disease: Secondary | ICD-10-CM

## 2020-02-04 DIAGNOSIS — E785 Hyperlipidemia, unspecified: Secondary | ICD-10-CM

## 2020-02-05 DIAGNOSIS — Z992 Dependence on renal dialysis: Secondary | ICD-10-CM | POA: Diagnosis not present

## 2020-02-05 DIAGNOSIS — D509 Iron deficiency anemia, unspecified: Secondary | ICD-10-CM | POA: Diagnosis not present

## 2020-02-05 DIAGNOSIS — N186 End stage renal disease: Secondary | ICD-10-CM | POA: Diagnosis not present

## 2020-02-07 DIAGNOSIS — D509 Iron deficiency anemia, unspecified: Secondary | ICD-10-CM | POA: Diagnosis not present

## 2020-02-07 DIAGNOSIS — N186 End stage renal disease: Secondary | ICD-10-CM | POA: Diagnosis not present

## 2020-02-07 DIAGNOSIS — Z992 Dependence on renal dialysis: Secondary | ICD-10-CM | POA: Diagnosis not present

## 2020-02-10 DIAGNOSIS — D509 Iron deficiency anemia, unspecified: Secondary | ICD-10-CM | POA: Diagnosis not present

## 2020-02-10 DIAGNOSIS — N186 End stage renal disease: Secondary | ICD-10-CM | POA: Diagnosis not present

## 2020-02-10 DIAGNOSIS — Z992 Dependence on renal dialysis: Secondary | ICD-10-CM | POA: Diagnosis not present

## 2020-02-10 NOTE — Progress Notes (Signed)
Cardiology Office Note    Date:  02/11/2020   ID:  JAZIAH KWASNIK, DOB 05-22-1971, MRN [id number]  PCP:  Neale Burly, MD  Cardiologist: Kate Sable, MD    Chief Complaint  Patient presents with  . Follow-up    History of Present Illness:    Angela Soto is a 49 y.o. female with past medical history of CAD (s/p prior PCI in 1998, recent catheterization in 10/2017 showing severe two-vessel CAD with heavy calcification along the LCx and 100% CTO of RCA with left to right collaterals noted -->medical management recommended as not felt to be a CABG candidate), ESRD (on HD - MWF), HTN, HLD, Hypothyroidism, IDDM, PVD (s/p bilateral BKA with left AKA in 05/2019), and tobacco use who presents to the office today for overdue follow-up.  She was last examined by myself in 09/2018 and reported intermittent episodes of chest discomfort typically occurring on a daily basis. Her pain had overall been constant throughout most of the day and symptoms had been stable for the past year. She had been experiencing hypotension during dialysis sessions and Toprol-XL was reduced to 100 mg daily. She did call the office in 12/2018 reporting intermittent episodes of chest discomfort and it was recommended she increase her Imdur to 90 mg daily. She did have a telehealth visit with Dr. Bronson Ing in 04/2019 and had recently undergone debridement of her left knee amputation by Dr. Donzetta Matters on 04/25/2019.  She denied any recent chest pain and was continued on her cardiac regimen at that time. In the interim, she did undergo left AKA revision from [facility] in 05/2019.  In talking with the patient today, she reports having recently received her second COVID-19 vaccination and did feel under the weather for a few days afterwards. She has overall recovered well from her surgery last year and is being followed by PT with plans for prosthetics in the coming weeks which she is very excited about.  Her breathing has overall  been at baseline and she denies any recent orthopnea, PND or abdominal distention. She does experience intermittent episodes of chest discomfort which typically last for a few seconds at a time and spontaneously resolve. Unsure if symptoms are worse on her dialysis days.  Past Medical History:  Diagnosis Date  . Coronary artery disease    a. s/p prior PCI in 1998 b. 10/2017: cath showing severe two-vessel CAD with heavy calcification along the LCx and 100% CTO of RCA with left to right collaterals noted. No good options for PCI. Medical management recommended.   . Depression   . Environmental allergies    uses inhalers  . ESRD (end stage renal disease) on dialysis [facility])    "started dialysis on 10/08/13; MWF; [facility]; [place], [place]"  . GERD (gastroesophageal reflux disease)   . QMVHQION(629.5)    "weekly" (11/09/2017)  . High cholesterol   . Hypertension   . Hypothyroidism   . Multiple sclerosis ([place])    just diagnosed 2014  . Myocardial infarction ([place]) 1998  . Nonalcoholic steatohepatitis ([facility])   . Peripheral vascular disease ([place])    aortic artery occlusion  . PONV (postoperative nausea and vomiting)   . Thyroid cancer ([place])   . Type 1 diabetes mellitus ([place])   . Umbilical hernia     Past Surgical History:  Procedure Laterality Date  . ABDOMINAL SURGERY  2006   aortic artery occluded, had to "clean it out"  . ABOVE KNEE LEG AMPUTATION Left 05/21/2019  . AMPUTATION  Left 09/22/2014   Procedure: AMPUTATION LEFT FOURTH FINGER;  Surgeon: Dayna Barker, MD;  Location: [place];  Service: Plastics;  Laterality: Left;  . AMPUTATION Left 04/25/2019   Procedure: DEBRIDEMENT BELOW KNEE AMPUTATION;  Surgeon: Waynetta Sandy, MD;  Location: [place];  Service: Vascular;  Laterality: Left;  . AMPUTATION Left 05/21/2019   Procedure: AMPUTATION ABOVE LEFT KNEE;  Surgeon: Waynetta Sandy, MD;  Location: [place];  Service: Vascular;  Laterality: Left;  . AV FISTULA PLACEMENT Left 09/24/2013     Procedure: LEFT UPPER EXTREMITY ARTERIOVENOUS (AV) FISTULA CREATION BRACHIAL/CEPHALIC;  Surgeon: Elam Dutch, MD;  Location: [place];  Service: Vascular;  Laterality: Left;  . BASCILIC VEIN TRANSPOSITION Left 02/18/2014   Procedure: BASILIC VEIN TRANSPOSITION - 2ND STAGE LEFT ARM;  Surgeon: Elam Dutch, MD;  Location: [place];  Service: Vascular;  Laterality: Left;  . CARDIAC CATHETERIZATION     "numerous"  . CARDIAC CATHETERIZATION  11/09/2017  . CORONARY ANGIOPLASTY    . CORONARY ANGIOPLASTY WITH STENT PLACEMENT     "think I have a total of 3 stents" (11/09/2017)  . INSERTION OF DIALYSIS CATHETER Right 10/07/2013   Procedure: INSERTION OF DIALYSIS CATHETER;  Surgeon: Conrad Kooskia, MD;  Location: [place];  Service: Vascular;  Laterality: Right;  . IRRIGATION AND DEBRIDEMENT ABSCESS Right   . LEFT HEART CATH AND CORONARY ANGIOGRAPHY N/A 11/09/2017   Procedure: LEFT HEART CATH AND CORONARY ANGIOGRAPHY;  Surgeon: Burnell Blanks, MD;  Location: [place] CV LAB;  Service: Cardiovascular;  Laterality: N/A;  . LEG AMPUTATION BELOW KNEE Bilateral 2008-2010   left-right  . PARATHYROIDECTOMY    . SHUNTOGRAM Left 04/17/2014   Procedure: FISTULOGRAM;  Surgeon: Conrad Homestead, MD;  Location: [facility] CATH LAB;  Service: Cardiovascular;  Laterality: Left;  . THYROIDECTOMY      Current Medications: Outpatient Medications Prior to Visit  Medication Sig Dispense Refill  . aspirin EC 81 MG tablet Take 1 tablet (81 mg total) by mouth daily. (Patient taking differently: Take 81 mg by mouth 2 (two) times a day. )    . calcium acetate (PHOSLO) 667 MG capsule Take 1,334-2,668 mg by mouth See admin instructions. Take 4 capsules (2668 mg) by mouth with meals and take 2 capsules (1334 mg) by mouth with snacks.    . famotidine (PEPCID) 40 MG tablet Take 40 mg by mouth every evening.    . folic acid-vitamin b complex-vitamin c-selenium-zinc (DIALYVITE) 3 MG TABS tablet Take 1 tablet by mouth daily.    .  furosemide (LASIX) 80 MG tablet Take 80 mg by mouth 2 (two) times a day.    . gabapentin (NEURONTIN) 100 MG capsule Take 100-200 mg by mouth 3 (three) times daily as needed (pain. (scheduled at night before bedtime)).    Marland Kitchen insulin glargine (LANTUS) 100 UNIT/ML injection Inject 31-62 Units into the skin at bedtime as needed (for blood sugars greater than 150).     . insulin lispro (HUMALOG) 100 UNIT/ML injection Inject 12 Units into the skin 3 (three) times daily as needed for high blood sugar (blood sugars greater than 130).     Marland Kitchen levothyroxine (SYNTHROID) 200 MCG tablet Take 200 mcg by mouth daily before breakfast.    . lidocaine-prilocaine (EMLA) cream Apply 1 application topically as needed (for pain/ apply prior to dialysis).    . metoprolol succinate (TOPROL-XL) 100 MG 24 hr tablet Take 100 mg by mouth 2 (two) times a day.     Marland Kitchen  Omega-3 Fatty Acids (FISH OIL) 1000 MG CAPS Take 1,000 mg by mouth 2 (two) times a day.     . oxyCODONE-acetaminophen (PERCOCET) 10-325 MG tablet Take 1 tablet by mouth 5 (five) times daily.    Marland Kitchen topiramate (TOPAMAX) 50 MG tablet Take 50 mg by mouth daily as needed (headaches.).    Marland Kitchen clopidogrel (PLAVIX) 75 MG tablet Take 75 mg by mouth every evening.     . isosorbide mononitrate (IMDUR) 60 MG 24 hr tablet TAKE 1 TABLET BY MOUTH EVERY MORNING AND TAKE 1/2 TABLET BY MOUTH EVERY EVENING 135 tablet 3  . rosuvastatin (CRESTOR) 10 MG tablet TAKE 1 TABLET BY MOUTH DAILY 90 tablet 3   No facility-administered medications prior to visit.     Allergies:   Metformin and related and Chantix [varenicline]   Social History   Socioeconomic History  . Marital status: Married    Spouse name: Caylynn Minchew  . Number of children: Not on file  . Years of education: Not on file  . Highest education level: Not on file  Occupational History  . Occupation: Disabled  Tobacco Use  . Smoking status: Current Every Day Smoker    Packs/day: 0.75    Years: 32.00    Pack years: 24.00      Types: Cigarettes  . Smokeless tobacco: Never Used  . Tobacco comment: 1/2 - 3/4 packs per day  Substance and Sexual Activity  . Alcohol use: No  . Drug use: No  . Sexual activity: Not Currently  Other Topics Concern  . Not on file  Social History Narrative  . Not on file   Social Determinants of Health   Financial Resource Strain: Low Risk   . Difficulty of Paying Living Expenses: Not very hard  Food Insecurity: No Food Insecurity  . Worried About Charity fundraiser in the Last Year: Never true  . Ran Out of Food in the Last Year: Never true  Transportation Needs: No Transportation Needs  . Lack of Transportation (Medical): No  . Lack of Transportation (Non-Medical): No  Physical Activity: Inactive  . Days of Exercise per Week: 0 days  . Minutes of Exercise per Session: 0 min  Stress: No Stress Concern Present  . Feeling of Stress : Only a little  Social Connections: Slightly Isolated  . Frequency of Communication with Friends and Family: More than three times a week  . Frequency of Social Gatherings with Friends and Family: More than three times a week  . Attends Religious Services: 1 to 4 times per year  . Active Member of Clubs or Organizations: No  . Attends Archivist Meetings: Never  . Marital Status: Married     Family History:  The patient's family history includes CAD (age of onset: 59) in her father; Diabetes Mellitus II in her father and mother; Ovarian cancer in her mother.   Review of Systems:   Please see the history of present illness.     General:  No chills, fever, night sweats or weight changes.  Cardiovascular:  No dyspnea on exertion, edema, orthopnea, palpitations, paroxysmal nocturnal dyspnea. Positive for chest pain.  Dermatological: No rash, lesions/masses Respiratory: No cough, dyspnea Urologic: No hematuria, dysuria Abdominal:   No nausea, vomiting, diarrhea, bright red blood per rectum, melena, or hematemesis Neurologic:  No  visual changes, wkns, changes in mental status. All other systems reviewed and are otherwise negative except as noted above.   Physical Exam:    VS:  BP  120/74   Pulse 60   Wt 205 lb (93 kg) Comment: per pt  LMP 05/24/2013   SpO2 98%   BMI 55.41 kg/m    General: Well developed, well nourished,female appearing in no acute distress. Head: Normocephalic, atraumatic, sclera non-icteric.  Neck: No carotid bruits. JVD not elevated.  Lungs: Respirations regular and unlabored, without wheezes or rales.  Heart: Regular rate and rhythm. No S3 or S4.  No murmur, no rubs, or gallops appreciated. Abdomen: Soft, non-tender, non-distended. No obvious abdominal masses. Msk:  Strength and tone appear normal for age. No obvious joint deformities or effusions. Extremities: No clubbing or cyanosis. Left AKA and right BKA. Neuro: Alert and oriented X 3. Moves all extremities spontaneously. No focal deficits noted. Psych:  Responds to questions appropriately with a normal affect. Skin: No rashes or lesions noted  Wt Readings from Last 3 Encounters:  02/11/20 205 lb (93 kg)  06/21/19 202 lb (91.6 kg)  05/27/19 202 lb 13.2 oz (92 kg)     Studies/Labs Reviewed:   EKG:  EKG is not ordered today.   Recent Labs: 04/25/2019: ALT 19 05/23/2019: BUN 26; Creatinine, Ser 4.91; Hemoglobin 11.0; Platelets 243; Potassium 3.6; Sodium 134   Lipid Panel    Component Value Date/Time   CHOL 185 11/10/2017 0527   TRIG 295 (H) 11/10/2017 0527   HDL 24 (L) 11/10/2017 0527   CHOLHDL 7.7 11/10/2017 0527   VLDL 59 (H) 11/10/2017 0527   LDLCALC 102 (H) 11/10/2017 0527    Additional studies/ records that were reviewed today include:   Echocardiogram: 10/2017 Study Conclusions   - Left ventricle: The cavity size was normal. Wall thickness was  increased in a pattern of mild LVH. Systolic function was normal.  The estimated ejection fraction was in the range of 50% to 55%.  There is hypokinesis of the  inferolateral myocardium. Features  are consistent with a pseudonormal left ventricular filling  pattern, with concomitant abnormal relaxation and increased  filling pressure (grade 2 diastolic dysfunction). Doppler  parameters are consistent with high ventricular filling pressure.  - Mitral valve: Calcified annulus.  - Left atrium: The atrium was severely dilated.   Impressions:   - Hypokinesis of the inferolateral wall with overall low normal LV  systolic function; mild LVH; moderate diastolic dysfunction;  severe LAE.   Cardiac Catheterization: 10/2017  Mid RCA to Dist RCA lesion is 100% stenosed.   1. Severe double vessel CAD 2. The LAD is a large caliber vessel that courses to the apex. The mid vessel has a long segment of moderate, calcific stenosis. This does not appear to be flow limiting. The first diagonal is a small caliber vessel with moderate ostial stenosis. The second diagonal branch is a small caliber vessel with mild plaque.  3. The Circumflex is a moderate caliber, heavily calcified vessel from the ostium down into the obtuse marginal branches. There is moderately severe, heavily calcified stenosis from the proximal vessel down into the most distal obtuse marginal branch. The obtuse marginal branch is completely occluded. It appears that the distal segment of this branch fills from bridging collaterals. The vessel is small in caliber after it reconstitutes.  4. The RCA is a large dominant vessel with 100% occlusion just beyond the ostium. The distal vessel and distal branches are seen to fill from left to right collaterals supplied by septal perforators.  5. LV systolic function is overall preserved with segmental wall motion abnormality. LVEF estimated around 45-50%.   Recommendations: She  has complex CAD with no good options for revascularization. Her RCA is chronically occluded and fills from collaterals. The entire Circumflex is diffusely diseased and  heavily calcified with a distal chronic total occlusion of the OM branch. I would not approach this vessel with PCI. If PCI of the Circumflex were considered, we would have to perform atherectomy of the entire vessel and likely could not cross the distal CTO. The entire vessel would need to be stented and the risk of restenosis would be very high in this patient with diabetes and ongoing tobacco abuse. She is not a candidate for CABG given her comorbid conditions including ESRD and the lack of graft conduit given bilateral lower extremity amputations. At this time, I would attempt to control her symptoms with medical therapy. She should consider stopping smoking as well.   Assessment:    1. Coronary artery disease of native artery of native heart with stable angina pectoris ([place])   2. Hyperlipidemia LDL goal <70   3. Essential hypertension   4. PVD (peripheral vascular disease) ([place])   5. Tobacco use      Plan:   In order of problems listed above:  1. CAD - she is s/p prior PCI in 1998 with catheterization in 10/2017 showing severe two-vessel CAD with heavy calcification along the LCx and 100% CTO of RCA with left to right collaterals noted and medical management was recommended as she was not felt to be a CABG candidate. - She describes intermittent episodes of chest discomfort which typically only last for a few seconds and spontaneously resolve. While this seems atypical for angina, she does report taking an extra Imdur at times and symptoms improve. She is currently on Imdur 60 mg in AM/30mg  in PM. Will titrate to 60mg  BID. She does not take this on her dialysis days. Continue ASA 81 mg daily, Plavix 75 mg daily and Toprol-XL 100 mg twice daily.  2. HLD - Followed by PCP.  Will request a copy of most recent labs as goal LDL is less than 70 in the setting of known CAD. She remains on Crestor 10 mg daily.  3. HTN - BP is well controlled at 120/74 during today's visit. Continue current  regimen with Toprol-XL 100 mg daily and will titrate Imdur as outlined above. She does hold her medications on the mornings of her HD sessions.   4. PVD - she is followed by Vascular Surgery with right BKA and recent left AKA. Remains on ASA, Plavix and statin therapy.   5. Tobacco Use - she continues to smoke 0.5 - 0.75 ppd. Previously intolerant to Chantix and she did try nicotine replacement in the past.     Medication Adjustments/Labs and Tests Ordered: Current medicines are reviewed at length with the patient today.  Concerns regarding medicines are outlined above.  Medication changes, Labs and Tests ordered today are listed in the Patient Instructions below. Patient Instructions  Medication Instructions:  Your physician recommends that you continue on your current medications as directed. Please refer to the Current Medication list given to you today.  Increase Indur to 60 mg Two Times Daily   *If you need a refill on your cardiac medications before your next appointment, please call your pharmacy*   Lab Work: NONE   If you have labs (blood work) drawn today and your tests are completely normal, you will receive your results only by: Marland Kitchen MyChart Message (if you have MyChart) OR . A paper copy in the mail If  you have any lab test that is abnormal or we need to change your treatment, we will call you to review the results.   Testing/Procedures: NONE    Follow-Up: At [facility], you and your health needs are our priority.  As part of our continuing mission to provide you with exceptional heart care, we have created designated Provider Care Teams.  These Care Teams include your primary Cardiologist (physician) and Advanced Practice Providers (APPs -  Physician Assistants and Nurse Practitioners) who all work together to provide you with the care you need, when you need it.  We recommend signing up for the patient portal called "MyChart".  Sign up information is provided on  this After Visit Summary.  MyChart is used to connect with patients for Virtual Visits (Telemedicine).  Patients are able to view lab/test results, encounter notes, upcoming appointments, etc.  Non-urgent messages can be sent to your provider as well.   To learn more about what you can do with MyChart, go to [email or web address].    Your next appointment:   6 month(s)  The format for your next appointment:   In Person  Provider:   Kate Sable, MD   Other Instructions Thank you for choosing [facility]!       Signed, Erma Heritage, PA-C  02/11/2020 4:49 PM    [facility] [address] [place], [place] [address] Phone: [phone] Fax: [phone]

## 2020-02-11 ENCOUNTER — Encounter: Payer: Self-pay | Admitting: *Deleted

## 2020-02-11 ENCOUNTER — Ambulatory Visit (INDEPENDENT_AMBULATORY_CARE_PROVIDER_SITE_OTHER): Payer: Medicare Other | Admitting: Student

## 2020-02-11 ENCOUNTER — Other Ambulatory Visit: Payer: Self-pay

## 2020-02-11 ENCOUNTER — Encounter: Payer: Self-pay | Admitting: Student

## 2020-02-11 VITALS — BP 120/74 | HR 60 | Wt 205.0 lb

## 2020-02-11 DIAGNOSIS — Z720 Tobacco use: Secondary | ICD-10-CM | POA: Diagnosis not present

## 2020-02-11 DIAGNOSIS — I739 Peripheral vascular disease, unspecified: Secondary | ICD-10-CM | POA: Diagnosis not present

## 2020-02-11 DIAGNOSIS — E785 Hyperlipidemia, unspecified: Secondary | ICD-10-CM

## 2020-02-11 DIAGNOSIS — I10 Essential (primary) hypertension: Secondary | ICD-10-CM

## 2020-02-11 DIAGNOSIS — I2510 Atherosclerotic heart disease of native coronary artery without angina pectoris: Secondary | ICD-10-CM | POA: Diagnosis not present

## 2020-02-11 DIAGNOSIS — I25118 Atherosclerotic heart disease of native coronary artery with other forms of angina pectoris: Secondary | ICD-10-CM

## 2020-02-11 MED ORDER — ROSUVASTATIN CALCIUM 10 MG PO TABS: 10.0000 mg | ORAL_TABLET | Freq: Every day | ORAL | 3 refills | Status: AC

## 2020-02-11 MED ORDER — CLOPIDOGREL BISULFATE 75 MG PO TABS: 75.0000 mg | ORAL_TABLET | Freq: Every evening | ORAL | 3 refills | Status: AC

## 2020-02-11 MED ORDER — ISOSORBIDE MONONITRATE ER 60 MG PO TB24: 60.0000 mg | ORAL_TABLET | Freq: Two times a day (BID) | ORAL | 3 refills | Status: DC

## 2020-02-11 NOTE — Patient Instructions (Signed)
Medication Instructions:  Your physician recommends that you continue on your current medications as directed. Please refer to the Current Medication list given to you today.  Increase Indur to 60 mg Two Times Daily   *If you need a refill on your cardiac medications before your next appointment, please call your pharmacy*   Lab Work: NONE   If you have labs (blood work) drawn today and your tests are completely normal, you will receive your results only by: Marland Kitchen MyChart Message (if you have MyChart) OR . A paper copy in the mail If you have any lab test that is abnormal or we need to change your treatment, we will call you to review the results.   Testing/Procedures: NONE    Follow-Up: At [facility], you and your health needs are our priority.  As part of our continuing mission to provide you with exceptional heart care, we have created designated Provider Care Teams.  These Care Teams include your primary Cardiologist (physician) and Advanced Practice Providers (APPs -  Physician Assistants and Nurse Practitioners) who all work together to provide you with the care you need, when you need it.  We recommend signing up for the patient portal called "MyChart".  Sign up information is provided on this After Visit Summary.  MyChart is used to connect with patients for Virtual Visits (Telemedicine).  Patients are able to view lab/test results, encounter notes, upcoming appointments, etc.  Non-urgent messages can be sent to your provider as well.   To learn more about what you can do with MyChart, go to [email or web address].    Your next appointment:   6 month(s)  The format for your next appointment:   In Person  Provider:   Kate Sable, MD   Other Instructions Thank you for choosing [facility]!

## 2020-02-12 DIAGNOSIS — N186 End stage renal disease: Secondary | ICD-10-CM | POA: Diagnosis not present

## 2020-02-12 DIAGNOSIS — Z992 Dependence on renal dialysis: Secondary | ICD-10-CM | POA: Diagnosis not present

## 2020-02-12 DIAGNOSIS — D509 Iron deficiency anemia, unspecified: Secondary | ICD-10-CM | POA: Diagnosis not present

## 2020-02-13 DIAGNOSIS — M47816 Spondylosis without myelopathy or radiculopathy, lumbar region: Secondary | ICD-10-CM | POA: Diagnosis not present

## 2020-02-13 DIAGNOSIS — M169 Osteoarthritis of hip, unspecified: Secondary | ICD-10-CM | POA: Diagnosis not present

## 2020-02-13 DIAGNOSIS — G894 Chronic pain syndrome: Secondary | ICD-10-CM | POA: Diagnosis not present

## 2020-02-13 DIAGNOSIS — T8789 Other complications of amputation stump: Secondary | ICD-10-CM | POA: Diagnosis not present

## 2020-02-14 DIAGNOSIS — Z992 Dependence on renal dialysis: Secondary | ICD-10-CM | POA: Diagnosis not present

## 2020-02-14 DIAGNOSIS — D509 Iron deficiency anemia, unspecified: Secondary | ICD-10-CM | POA: Diagnosis not present

## 2020-02-14 DIAGNOSIS — N186 End stage renal disease: Secondary | ICD-10-CM | POA: Diagnosis not present

## 2020-02-17 DIAGNOSIS — D509 Iron deficiency anemia, unspecified: Secondary | ICD-10-CM | POA: Diagnosis not present

## 2020-02-17 DIAGNOSIS — Z992 Dependence on renal dialysis: Secondary | ICD-10-CM | POA: Diagnosis not present

## 2020-02-17 DIAGNOSIS — N186 End stage renal disease: Secondary | ICD-10-CM | POA: Diagnosis not present

## 2020-02-18 DIAGNOSIS — S88912D Complete traumatic amputation of left lower leg, level unspecified, subsequent encounter: Secondary | ICD-10-CM | POA: Diagnosis not present

## 2020-02-19 DIAGNOSIS — N186 End stage renal disease: Secondary | ICD-10-CM | POA: Diagnosis not present

## 2020-02-19 DIAGNOSIS — D509 Iron deficiency anemia, unspecified: Secondary | ICD-10-CM | POA: Diagnosis not present

## 2020-02-19 DIAGNOSIS — Z992 Dependence on renal dialysis: Secondary | ICD-10-CM | POA: Diagnosis not present

## 2020-02-21 DIAGNOSIS — D509 Iron deficiency anemia, unspecified: Secondary | ICD-10-CM | POA: Diagnosis not present

## 2020-02-21 DIAGNOSIS — N186 End stage renal disease: Secondary | ICD-10-CM | POA: Diagnosis not present

## 2020-02-21 DIAGNOSIS — Z992 Dependence on renal dialysis: Secondary | ICD-10-CM | POA: Diagnosis not present

## 2020-02-24 DIAGNOSIS — D509 Iron deficiency anemia, unspecified: Secondary | ICD-10-CM | POA: Diagnosis not present

## 2020-02-24 DIAGNOSIS — N186 End stage renal disease: Secondary | ICD-10-CM | POA: Diagnosis not present

## 2020-02-24 DIAGNOSIS — Z992 Dependence on renal dialysis: Secondary | ICD-10-CM | POA: Diagnosis not present

## 2020-02-26 DIAGNOSIS — Z992 Dependence on renal dialysis: Secondary | ICD-10-CM | POA: Diagnosis not present

## 2020-02-26 DIAGNOSIS — N186 End stage renal disease: Secondary | ICD-10-CM | POA: Diagnosis not present

## 2020-02-26 DIAGNOSIS — D509 Iron deficiency anemia, unspecified: Secondary | ICD-10-CM | POA: Diagnosis not present

## 2020-02-28 DIAGNOSIS — D509 Iron deficiency anemia, unspecified: Secondary | ICD-10-CM | POA: Diagnosis not present

## 2020-02-28 DIAGNOSIS — Z992 Dependence on renal dialysis: Secondary | ICD-10-CM | POA: Diagnosis not present

## 2020-02-28 DIAGNOSIS — N186 End stage renal disease: Secondary | ICD-10-CM | POA: Diagnosis not present

## 2020-03-02 DIAGNOSIS — Z992 Dependence on renal dialysis: Secondary | ICD-10-CM | POA: Diagnosis not present

## 2020-03-02 DIAGNOSIS — N186 End stage renal disease: Secondary | ICD-10-CM | POA: Diagnosis not present

## 2020-03-02 DIAGNOSIS — D509 Iron deficiency anemia, unspecified: Secondary | ICD-10-CM | POA: Diagnosis not present

## 2020-03-04 DIAGNOSIS — N186 End stage renal disease: Secondary | ICD-10-CM | POA: Diagnosis not present

## 2020-03-04 DIAGNOSIS — D509 Iron deficiency anemia, unspecified: Secondary | ICD-10-CM | POA: Diagnosis not present

## 2020-03-04 DIAGNOSIS — Z992 Dependence on renal dialysis: Secondary | ICD-10-CM | POA: Diagnosis not present

## 2020-03-06 DIAGNOSIS — D509 Iron deficiency anemia, unspecified: Secondary | ICD-10-CM | POA: Diagnosis not present

## 2020-03-06 DIAGNOSIS — N186 End stage renal disease: Secondary | ICD-10-CM | POA: Diagnosis not present

## 2020-03-06 DIAGNOSIS — Z992 Dependence on renal dialysis: Secondary | ICD-10-CM | POA: Diagnosis not present

## 2020-03-09 DIAGNOSIS — Z992 Dependence on renal dialysis: Secondary | ICD-10-CM | POA: Diagnosis not present

## 2020-03-09 DIAGNOSIS — D509 Iron deficiency anemia, unspecified: Secondary | ICD-10-CM | POA: Diagnosis not present

## 2020-03-09 DIAGNOSIS — N186 End stage renal disease: Secondary | ICD-10-CM | POA: Diagnosis not present

## 2020-03-11 DIAGNOSIS — D509 Iron deficiency anemia, unspecified: Secondary | ICD-10-CM | POA: Diagnosis not present

## 2020-03-11 DIAGNOSIS — N186 End stage renal disease: Secondary | ICD-10-CM | POA: Diagnosis not present

## 2020-03-11 DIAGNOSIS — Z992 Dependence on renal dialysis: Secondary | ICD-10-CM | POA: Diagnosis not present

## 2020-03-12 DIAGNOSIS — T8789 Other complications of amputation stump: Secondary | ICD-10-CM | POA: Diagnosis not present

## 2020-03-12 DIAGNOSIS — M47816 Spondylosis without myelopathy or radiculopathy, lumbar region: Secondary | ICD-10-CM | POA: Diagnosis not present

## 2020-03-12 DIAGNOSIS — M169 Osteoarthritis of hip, unspecified: Secondary | ICD-10-CM | POA: Diagnosis not present

## 2020-03-12 DIAGNOSIS — G894 Chronic pain syndrome: Secondary | ICD-10-CM | POA: Diagnosis not present

## 2020-03-13 DIAGNOSIS — D509 Iron deficiency anemia, unspecified: Secondary | ICD-10-CM | POA: Diagnosis not present

## 2020-03-13 DIAGNOSIS — N186 End stage renal disease: Secondary | ICD-10-CM | POA: Diagnosis not present

## 2020-03-13 DIAGNOSIS — Z992 Dependence on renal dialysis: Secondary | ICD-10-CM | POA: Diagnosis not present

## 2020-03-16 DIAGNOSIS — N186 End stage renal disease: Secondary | ICD-10-CM | POA: Diagnosis not present

## 2020-03-16 DIAGNOSIS — D509 Iron deficiency anemia, unspecified: Secondary | ICD-10-CM | POA: Diagnosis not present

## 2020-03-16 DIAGNOSIS — Z992 Dependence on renal dialysis: Secondary | ICD-10-CM | POA: Diagnosis not present

## 2020-03-18 DIAGNOSIS — N186 End stage renal disease: Secondary | ICD-10-CM | POA: Diagnosis not present

## 2020-03-18 DIAGNOSIS — N2581 Secondary hyperparathyroidism of renal origin: Secondary | ICD-10-CM | POA: Diagnosis not present

## 2020-03-18 DIAGNOSIS — Z992 Dependence on renal dialysis: Secondary | ICD-10-CM | POA: Diagnosis not present

## 2020-03-18 DIAGNOSIS — D509 Iron deficiency anemia, unspecified: Secondary | ICD-10-CM | POA: Diagnosis not present

## 2020-03-20 DIAGNOSIS — N186 End stage renal disease: Secondary | ICD-10-CM | POA: Diagnosis not present

## 2020-03-20 DIAGNOSIS — N2581 Secondary hyperparathyroidism of renal origin: Secondary | ICD-10-CM | POA: Diagnosis not present

## 2020-03-20 DIAGNOSIS — D509 Iron deficiency anemia, unspecified: Secondary | ICD-10-CM | POA: Diagnosis not present

## 2020-03-20 DIAGNOSIS — Z992 Dependence on renal dialysis: Secondary | ICD-10-CM | POA: Diagnosis not present

## 2020-03-23 DIAGNOSIS — Z992 Dependence on renal dialysis: Secondary | ICD-10-CM | POA: Diagnosis not present

## 2020-03-23 DIAGNOSIS — D509 Iron deficiency anemia, unspecified: Secondary | ICD-10-CM | POA: Diagnosis not present

## 2020-03-23 DIAGNOSIS — N2581 Secondary hyperparathyroidism of renal origin: Secondary | ICD-10-CM | POA: Diagnosis not present

## 2020-03-23 DIAGNOSIS — N186 End stage renal disease: Secondary | ICD-10-CM | POA: Diagnosis not present

## 2020-03-24 DIAGNOSIS — Z79891 Long term (current) use of opiate analgesic: Secondary | ICD-10-CM | POA: Diagnosis not present

## 2020-03-24 DIAGNOSIS — G894 Chronic pain syndrome: Secondary | ICD-10-CM | POA: Diagnosis not present

## 2020-03-25 DIAGNOSIS — D509 Iron deficiency anemia, unspecified: Secondary | ICD-10-CM | POA: Diagnosis not present

## 2020-03-25 DIAGNOSIS — N186 End stage renal disease: Secondary | ICD-10-CM | POA: Diagnosis not present

## 2020-03-25 DIAGNOSIS — Z992 Dependence on renal dialysis: Secondary | ICD-10-CM | POA: Diagnosis not present

## 2020-03-25 DIAGNOSIS — N2581 Secondary hyperparathyroidism of renal origin: Secondary | ICD-10-CM | POA: Diagnosis not present

## 2020-03-26 DIAGNOSIS — L281 Prurigo nodularis: Secondary | ICD-10-CM | POA: Diagnosis not present

## 2020-03-26 DIAGNOSIS — L280 Lichen simplex chronicus: Secondary | ICD-10-CM | POA: Diagnosis not present

## 2020-03-27 DIAGNOSIS — Z992 Dependence on renal dialysis: Secondary | ICD-10-CM | POA: Diagnosis not present

## 2020-03-27 DIAGNOSIS — N2581 Secondary hyperparathyroidism of renal origin: Secondary | ICD-10-CM | POA: Diagnosis not present

## 2020-03-27 DIAGNOSIS — N186 End stage renal disease: Secondary | ICD-10-CM | POA: Diagnosis not present

## 2020-03-27 DIAGNOSIS — D509 Iron deficiency anemia, unspecified: Secondary | ICD-10-CM | POA: Diagnosis not present

## 2020-03-30 DIAGNOSIS — N2581 Secondary hyperparathyroidism of renal origin: Secondary | ICD-10-CM | POA: Diagnosis not present

## 2020-03-30 DIAGNOSIS — Z992 Dependence on renal dialysis: Secondary | ICD-10-CM | POA: Diagnosis not present

## 2020-03-30 DIAGNOSIS — N186 End stage renal disease: Secondary | ICD-10-CM | POA: Diagnosis not present

## 2020-03-30 DIAGNOSIS — D509 Iron deficiency anemia, unspecified: Secondary | ICD-10-CM | POA: Diagnosis not present

## 2020-04-01 DIAGNOSIS — N186 End stage renal disease: Secondary | ICD-10-CM | POA: Diagnosis not present

## 2020-04-01 DIAGNOSIS — D509 Iron deficiency anemia, unspecified: Secondary | ICD-10-CM | POA: Diagnosis not present

## 2020-04-01 DIAGNOSIS — N2581 Secondary hyperparathyroidism of renal origin: Secondary | ICD-10-CM | POA: Diagnosis not present

## 2020-04-01 DIAGNOSIS — Z992 Dependence on renal dialysis: Secondary | ICD-10-CM | POA: Diagnosis not present

## 2020-04-02 ENCOUNTER — Ambulatory Visit (HOSPITAL_COMMUNITY): Payer: Medicare Other | Admitting: Specialist

## 2020-04-03 DIAGNOSIS — D509 Iron deficiency anemia, unspecified: Secondary | ICD-10-CM | POA: Diagnosis not present

## 2020-04-03 DIAGNOSIS — N186 End stage renal disease: Secondary | ICD-10-CM | POA: Diagnosis not present

## 2020-04-03 DIAGNOSIS — N2581 Secondary hyperparathyroidism of renal origin: Secondary | ICD-10-CM | POA: Diagnosis not present

## 2020-04-03 DIAGNOSIS — Z992 Dependence on renal dialysis: Secondary | ICD-10-CM | POA: Diagnosis not present

## 2020-04-06 DIAGNOSIS — D509 Iron deficiency anemia, unspecified: Secondary | ICD-10-CM | POA: Diagnosis not present

## 2020-04-06 DIAGNOSIS — Z992 Dependence on renal dialysis: Secondary | ICD-10-CM | POA: Diagnosis not present

## 2020-04-06 DIAGNOSIS — N186 End stage renal disease: Secondary | ICD-10-CM | POA: Diagnosis not present

## 2020-04-06 DIAGNOSIS — N2581 Secondary hyperparathyroidism of renal origin: Secondary | ICD-10-CM | POA: Diagnosis not present

## 2020-04-08 DIAGNOSIS — Z992 Dependence on renal dialysis: Secondary | ICD-10-CM | POA: Diagnosis not present

## 2020-04-08 DIAGNOSIS — D509 Iron deficiency anemia, unspecified: Secondary | ICD-10-CM | POA: Diagnosis not present

## 2020-04-08 DIAGNOSIS — N2581 Secondary hyperparathyroidism of renal origin: Secondary | ICD-10-CM | POA: Diagnosis not present

## 2020-04-08 DIAGNOSIS — N186 End stage renal disease: Secondary | ICD-10-CM | POA: Diagnosis not present

## 2020-04-10 DIAGNOSIS — N2581 Secondary hyperparathyroidism of renal origin: Secondary | ICD-10-CM | POA: Diagnosis not present

## 2020-04-10 DIAGNOSIS — N186 End stage renal disease: Secondary | ICD-10-CM | POA: Diagnosis not present

## 2020-04-10 DIAGNOSIS — D509 Iron deficiency anemia, unspecified: Secondary | ICD-10-CM | POA: Diagnosis not present

## 2020-04-10 DIAGNOSIS — Z992 Dependence on renal dialysis: Secondary | ICD-10-CM | POA: Diagnosis not present

## 2020-04-13 DIAGNOSIS — Z992 Dependence on renal dialysis: Secondary | ICD-10-CM | POA: Diagnosis not present

## 2020-04-13 DIAGNOSIS — N2581 Secondary hyperparathyroidism of renal origin: Secondary | ICD-10-CM | POA: Diagnosis not present

## 2020-04-13 DIAGNOSIS — N186 End stage renal disease: Secondary | ICD-10-CM | POA: Diagnosis not present

## 2020-04-13 DIAGNOSIS — D509 Iron deficiency anemia, unspecified: Secondary | ICD-10-CM | POA: Diagnosis not present

## 2020-04-15 DIAGNOSIS — N186 End stage renal disease: Secondary | ICD-10-CM | POA: Diagnosis not present

## 2020-04-15 DIAGNOSIS — N2581 Secondary hyperparathyroidism of renal origin: Secondary | ICD-10-CM | POA: Diagnosis not present

## 2020-04-15 DIAGNOSIS — Z992 Dependence on renal dialysis: Secondary | ICD-10-CM | POA: Diagnosis not present

## 2020-04-15 DIAGNOSIS — D509 Iron deficiency anemia, unspecified: Secondary | ICD-10-CM | POA: Diagnosis not present

## 2020-04-17 DIAGNOSIS — N186 End stage renal disease: Secondary | ICD-10-CM | POA: Diagnosis not present

## 2020-04-17 DIAGNOSIS — D509 Iron deficiency anemia, unspecified: Secondary | ICD-10-CM | POA: Diagnosis not present

## 2020-04-17 DIAGNOSIS — Z992 Dependence on renal dialysis: Secondary | ICD-10-CM | POA: Diagnosis not present

## 2020-04-17 DIAGNOSIS — D631 Anemia in chronic kidney disease: Secondary | ICD-10-CM | POA: Diagnosis not present

## 2020-04-20 DIAGNOSIS — D631 Anemia in chronic kidney disease: Secondary | ICD-10-CM | POA: Diagnosis not present

## 2020-04-20 DIAGNOSIS — N186 End stage renal disease: Secondary | ICD-10-CM | POA: Diagnosis not present

## 2020-04-20 DIAGNOSIS — D509 Iron deficiency anemia, unspecified: Secondary | ICD-10-CM | POA: Diagnosis not present

## 2020-04-20 DIAGNOSIS — Z992 Dependence on renal dialysis: Secondary | ICD-10-CM | POA: Diagnosis not present

## 2020-04-22 ENCOUNTER — Telehealth: Payer: Self-pay | Admitting: Student

## 2020-04-22 DIAGNOSIS — N186 End stage renal disease: Secondary | ICD-10-CM | POA: Diagnosis not present

## 2020-04-22 DIAGNOSIS — Z992 Dependence on renal dialysis: Secondary | ICD-10-CM | POA: Diagnosis not present

## 2020-04-22 DIAGNOSIS — D631 Anemia in chronic kidney disease: Secondary | ICD-10-CM | POA: Diagnosis not present

## 2020-04-22 DIAGNOSIS — D509 Iron deficiency anemia, unspecified: Secondary | ICD-10-CM | POA: Diagnosis not present

## 2020-04-22 MED ORDER — ISOSORBIDE MONONITRATE ER 60 MG PO TB24: 60.0000 mg | ORAL_TABLET | Freq: Two times a day (BID) | ORAL | 3 refills | Status: AC

## 2020-04-22 NOTE — Telephone Encounter (Signed)
Needed refill of Imdur-done

## 2020-04-22 NOTE — Telephone Encounter (Signed)
[id number] quiestions   Please call  [phone]   thanks renee

## 2020-04-23 ENCOUNTER — Other Ambulatory Visit: Payer: Self-pay

## 2020-04-23 ENCOUNTER — Encounter (HOSPITAL_COMMUNITY): Payer: Self-pay | Admitting: Occupational Therapy

## 2020-04-23 ENCOUNTER — Ambulatory Visit (HOSPITAL_COMMUNITY): Payer: Medicare Other | Attending: Internal Medicine | Admitting: Occupational Therapy

## 2020-04-23 DIAGNOSIS — Z89511 Acquired absence of right leg below knee: Secondary | ICD-10-CM | POA: Insufficient documentation

## 2020-04-23 DIAGNOSIS — M6281 Muscle weakness (generalized): Secondary | ICD-10-CM | POA: Diagnosis not present

## 2020-04-23 DIAGNOSIS — Z89612 Acquired absence of left leg above knee: Secondary | ICD-10-CM | POA: Insufficient documentation

## 2020-04-23 NOTE — Therapy (Addendum)
[place], [place], [address] Phone: [phone]   Fax:  [phone]  Occupational Therapy Evaluation  Patient Details  Name: Angela Soto MRN: [id number] Date of Birth: 1971-01-05 Referring Provider (OT): Dr. Stoney Bang   Encounter Date: 04/23/2020   OT End of Session - 04/23/20 1150    Visit Number 1    Number of Visits 1    Date for OT Re-Evaluation 04/30/20    Authorization Type 1) Medicare A & B 2) Medicaid    OT Start Time [phone]    OT Stop Time 1005    OT Time Calculation (min) 40 min    Activity Tolerance Patient tolerated treatment well    Behavior During Therapy [facility] for tasks assessed/performed           Past Medical History:  Diagnosis Date  . Coronary artery disease    a. s/p prior PCI in 1998 b. 10/2017: cath showing severe two-vessel CAD with heavy calcification along the LCx and 100% CTO of RCA with left to right collaterals noted. No good options for PCI. Medical management recommended.   . Depression   . Environmental allergies    uses inhalers  . ESRD (end stage renal disease) on dialysis [facility])    "started dialysis on 10/08/13; MWF; [facility]; [place], [place]"  . GERD (gastroesophageal reflux disease)   . XBMWUXLK(440.1)    "weekly" (11/09/2017)  . High cholesterol   . Hypertension   . Hypothyroidism   . Multiple sclerosis ([place])    just diagnosed 2014  . Myocardial infarction ([place]) 1998  . Nonalcoholic steatohepatitis ([facility])   . Peripheral vascular disease ([place])    aortic artery occlusion  . PONV (postoperative nausea and vomiting)   . Thyroid cancer ([place])   . Type 1 diabetes mellitus ([place])   . Umbilical hernia     Past Surgical History:  Procedure Laterality Date  . ABDOMINAL SURGERY  2006   aortic artery occluded, had to "clean it out"  . ABOVE KNEE LEG AMPUTATION Left 05/21/2019  . AMPUTATION Left 09/22/2014   Procedure: AMPUTATION LEFT FOURTH FINGER;  Surgeon: Dayna Barker, MD;   Location: [place];  Service: Plastics;  Laterality: Left;  . AMPUTATION Left 04/25/2019   Procedure: DEBRIDEMENT BELOW KNEE AMPUTATION;  Surgeon: Waynetta Sandy, MD;  Location: [place];  Service: Vascular;  Laterality: Left;  . AMPUTATION Left 05/21/2019   Procedure: AMPUTATION ABOVE LEFT KNEE;  Surgeon: Waynetta Sandy, MD;  Location: [place];  Service: Vascular;  Laterality: Left;  . AV FISTULA PLACEMENT Left 09/24/2013   Procedure: LEFT UPPER EXTREMITY ARTERIOVENOUS (AV) FISTULA CREATION BRACHIAL/CEPHALIC;  Surgeon: Elam Dutch, MD;  Location: [place];  Service: Vascular;  Laterality: Left;  . BASCILIC VEIN TRANSPOSITION Left 02/18/2014   Procedure: BASILIC VEIN TRANSPOSITION - 2ND STAGE LEFT ARM;  Surgeon: Elam Dutch, MD;  Location: [place];  Service: Vascular;  Laterality: Left;  . CARDIAC CATHETERIZATION     "numerous"  . CARDIAC CATHETERIZATION  11/09/2017  . CORONARY ANGIOPLASTY    . CORONARY ANGIOPLASTY WITH STENT PLACEMENT     "think I have a total of 3 stents" (11/09/2017)  . INSERTION OF DIALYSIS CATHETER Right 10/07/2013   Procedure: INSERTION OF DIALYSIS CATHETER;  Surgeon: Conrad [place], MD;  Location: [place];  Service: Vascular;  Laterality: Right;  . IRRIGATION AND DEBRIDEMENT ABSCESS Right   . LEFT HEART CATH AND CORONARY ANGIOGRAPHY N/A 11/09/2017   Procedure:  LEFT HEART CATH AND CORONARY ANGIOGRAPHY;  Surgeon: Burnell Blanks, MD;  Location: [place] CV LAB;  Service: Cardiovascular;  Laterality: N/A;  . LEG AMPUTATION BELOW KNEE Bilateral 2008-2010   left-right  . PARATHYROIDECTOMY    . SHUNTOGRAM Left 04/17/2014   Procedure: FISTULOGRAM;  Surgeon: Conrad [place], MD;  Location: [facility] CATH LAB;  Service: Cardiovascular;  Laterality: Left;  . THYROIDECTOMY      There were no vitals filed for this visit.      [facility] OT Assessment - 04/23/20 1149      Assessment   Medical Diagnosis Wheelchair evaluation    Referring Provider (OT) Dr. Stoney Bang      Onset Date/Surgical Date --   chronic-several years ago   Hand Dominance Right    Prior Therapy HH after [facility]      Precautions   Precautions None      Restrictions   Weight Bearing Restrictions No      Balance Screen   Has the patient fallen in the past 6 months No    Has the patient had a decrease in activity level because of a fear of falling?  No    Is the patient reluctant to leave their home because of a fear of falling?  No            Date: 04/23/20 Patient Name: Angela Soto Address: [address], [place], [place] [address] DOB: 1970/11/07  To whom it may concern,   Estephani Popper was seen today in this clinic for a power wheelchair evaluation. Mrs. Stoneman has a hx of bilateral BKAs with a LLE conversion to AKA in 05/2019. Mrs. Macchi has multiple medical conditions including multiple sclerosis, CAD, diabetes, renal failure, and is on weekly dialysis. She is in chronic pain reporting daily pain of 5/10 with pain medication, increasing to a 10/10 without pain medication. Mrs. Flock has a significant hx of recurring sacral wounds due to significant prominence of ischial tuberosities.   Mrs. Mauro lives with her husband in a one level house with ramp at the back door for entry. Her home is wheelchair accessible and she has been using a power wheelchair that was issued in 2016 for mobility. Her husband assists with ADLs and I/ADLs as needed and also provides assistance with transfers. He is registered as her CAP aide and provides all necessary assistance with ADLs, I/ADLs, and transfer tasks.    A FULL PHYSICAL ASSESSMENT REVEALS THE FOLLOWING   Existing Equipment: Quantum Edge 2.0 power chair, shower seat, raised toilet seat,       slide board, ramp, built up eating utensils    Transfers: min/mod assist with use of sliding board   Head and Neck: WFL      Trunk: WFL   Pelvis: WFL     Hip: Limited, flexion contractures   Knees: LLE N/A, RLE WFL   Feet and Ankles:  N/A    Upper Extremities: ROM is WNL, strength is 4/5 in BUE. Pt has left 4th digit amputation. Grip strength is functional. Pt with sensation changes in bilateral hands due to MS, has difficulty with coordination.   Lower Extremities: bilateral hip strength is 4/5, extensors 3/5   Weight Shifting Ability: independent    Skin Integrity: hx of recurring sacral pressure ulcers due to extended amounts of time in seated position, including when in bed due to contractures.        Cognition: WNL    Activity Tolerance: Poor.   GOALS/OBJECTIVE  OF SEATING INTERVENTION:    Function: Mrs. Ellsworth has functional deficits in the areas of mobility and self-care which are due to the above diagnoses. Specific functional deficits include: inability to stand or ambulate, inability to propel a standard manual wheelchair due to decreased BUE strength, SOB, poor activity tolerance, and decreased strength and coordination in bilateral hands. Mrs. Allbee will use a power wheelchair as her primary means of mobility on a full-time basis. Mrs. Stilley mobility and positioning requirements make a standard power wheelchair inappropriate for her. The below recommended wheelchair meets the patient's current needs for seating, positioning, and mobility.   If you require any further information concerning Mayme Harned's positioning, independence or mobility needs; or any further information why a lesser device will not work, please do not hesitate to contact me at [place], [address] [address] [place], [place] [address] [phone].     [id number] Hatchet 3 Power Wheelchair Patient has physical and cognitive capabilities to use the recommended power mobility device. Patient's home provides adequate access between rooms, maneuvering space, and surfaces for the operation of a power chair.  This power base is required in order to provide the patient with the medically necessary  electronics and multiple power operations through the recommended hand control.  This chair is more maneuverable than comparably priced traditional power wheelchairs due to the drive wheels being directly at the center of gravity of the user.  This maneuverability is required to enable the patient to negotiate the tight turns, small spaces, and obstacles characteristic of the home environment.  E2311/E2377/E2313: Multiple Actuator Control Through Expandable Controller These electronics are sufficiently programmable to allow patient to operate the power chair safely within the home environment. The patient requires these controls to operate the power seat functions through the joystick, thus eliminating additional switches and the need to find additional access points, as they are proficient with controlling the power chair through the joystick. These controls will also allow for any further modifications/addition to the drive controls if the patient's condition should worsen. Expandable controls are absolutely necessary to operate 3 or more power seat functions on a power wheelchair. It is recommended that this patient's chair be equipped with power tilt, power recline, and power elevating legrest.  G8366: Power Tilt and Recline Patient must rest in the recumbent position several times per day and transfers between the wheelchair and bed are very difficult. The tilt mechanism decreases the need for extensive assistance required for multiple transfers and allows patient to independently position self in the chair. The patient is at high risk for skin breakdown, and requires need to independently pressure relieve. The power recline option allows patient to come to a fully supine position, allowing easier clothing changes. Power recline combined with power tilt allows for more adequate pressure relief than power tilt alone.  E0955/ Q9476 : Headrest Pad w/  Multi Axis Hardware Patient requires the headrest to  provide posterior support for head and neck while in a tilted position. Without a headrest, patient would experience strain in the neck muscles, reducing patient's capacity to tilt to relieve pressure and reposition.  L4650: [place] A swing away joystick mount allows patient to come closer to tables for meals and other activities. It also allows patient clear access to armrests when transferring.  P5465: [place] Adjustable Skin Protection Cushion. The patient spends most of the day in the seated position, and is at high risk of skin breakdown. This  adjustable air skin protection cushion will provide appropriate pressure relief and skin protection, while also helping to stabilize the pelvis in an appropriate position.  [place] Deep Contour Acta Backrest This backrest will provide appropriate lateral trunk support to keep patient in upright seated position.  G4010: Power Adjustable Seat Height The patient must elevate to transfer to different surfaces. Power adjustable seat height will allow for patient to reach counter tops and upper cabinets, increasing her independence with cooking, cleaning, grooming and hygiene.  U7253/G6440: Thigh Guides with Removable Hardware Thigh guides will help to correct patient's abducted sitting posture. This will keep her pelvis and UE inline, and safe from injury.  H4742: NF22 Batteries A pair of NF22 batteries are required to power the wheelchair, as well as power seat functions.     Visit Diagnosis: Hx of right BKA ([place])  Hx of AKA (above knee amputation), left (H[facility])  Muscle weakness (generalized)    Problem List Patient Active Problem List   Diagnosis Date Noted  . S/P above knee amputation ([place]) 05/21/2019  . Non-healing surgical wound 04/25/2019  . Chest pain, rule out acute myocardial infarction 11/09/2017  . Non-ST elevation (NSTEMI) myocardial infarction ([place])   . Infection of hand 09/21/2014  . Finger  infection 09/21/2014  . End stage renal disease on dialysis ([place]) 11/14/2013  . Tobacco abuse 10/18/2013  . Atherosclerotic peripheral vascular disease ([place]) 10/18/2013  . Pelvic pain in female 10/18/2013  . Weakness 10/18/2013  . Demyelinating disorder ([place]) 10/18/2013  . Nausea with vomiting 10/18/2013  . Chronic kidney disease, stage V ([place]) 09/17/2013  . Demyelinating disease ([place]) 08/02/2013  . Abdominal pain 07/30/2013  . Renal failure 07/30/2013  . IDDM (insulin dependent diabetes mellitus) 07/30/2013  . HTN (hypertension) 07/30/2013  . Ascites 07/30/2013  . CAD (coronary artery disease) 07/30/2013   Guadelupe Sabin, OTR/L  [phone] 04/23/2020, 11:52 AM  [place] [address] [place], [place], [address] Phone: [phone]   Fax:  [phone]  Name: DAVANNA HE MRN: [id number] Date of Birth: September 30, 1971

## 2020-04-24 DIAGNOSIS — N186 End stage renal disease: Secondary | ICD-10-CM | POA: Diagnosis not present

## 2020-04-24 DIAGNOSIS — D509 Iron deficiency anemia, unspecified: Secondary | ICD-10-CM | POA: Diagnosis not present

## 2020-04-24 DIAGNOSIS — D631 Anemia in chronic kidney disease: Secondary | ICD-10-CM | POA: Diagnosis not present

## 2020-04-24 DIAGNOSIS — Z992 Dependence on renal dialysis: Secondary | ICD-10-CM | POA: Diagnosis not present

## 2020-04-27 DIAGNOSIS — N186 End stage renal disease: Secondary | ICD-10-CM | POA: Diagnosis not present

## 2020-04-27 DIAGNOSIS — Z992 Dependence on renal dialysis: Secondary | ICD-10-CM | POA: Diagnosis not present

## 2020-04-27 DIAGNOSIS — D631 Anemia in chronic kidney disease: Secondary | ICD-10-CM | POA: Diagnosis not present

## 2020-04-27 DIAGNOSIS — D509 Iron deficiency anemia, unspecified: Secondary | ICD-10-CM | POA: Diagnosis not present

## 2020-04-29 DIAGNOSIS — D509 Iron deficiency anemia, unspecified: Secondary | ICD-10-CM | POA: Diagnosis not present

## 2020-04-29 DIAGNOSIS — Z992 Dependence on renal dialysis: Secondary | ICD-10-CM | POA: Diagnosis not present

## 2020-04-29 DIAGNOSIS — N186 End stage renal disease: Secondary | ICD-10-CM | POA: Diagnosis not present

## 2020-04-29 DIAGNOSIS — D631 Anemia in chronic kidney disease: Secondary | ICD-10-CM | POA: Diagnosis not present

## 2020-05-01 DIAGNOSIS — D509 Iron deficiency anemia, unspecified: Secondary | ICD-10-CM | POA: Diagnosis not present

## 2020-05-01 DIAGNOSIS — Z992 Dependence on renal dialysis: Secondary | ICD-10-CM | POA: Diagnosis not present

## 2020-05-01 DIAGNOSIS — D631 Anemia in chronic kidney disease: Secondary | ICD-10-CM | POA: Diagnosis not present

## 2020-05-01 DIAGNOSIS — N186 End stage renal disease: Secondary | ICD-10-CM | POA: Diagnosis not present

## 2020-05-04 DIAGNOSIS — D509 Iron deficiency anemia, unspecified: Secondary | ICD-10-CM | POA: Diagnosis not present

## 2020-05-04 DIAGNOSIS — D631 Anemia in chronic kidney disease: Secondary | ICD-10-CM | POA: Diagnosis not present

## 2020-05-04 DIAGNOSIS — Z992 Dependence on renal dialysis: Secondary | ICD-10-CM | POA: Diagnosis not present

## 2020-05-04 DIAGNOSIS — N186 End stage renal disease: Secondary | ICD-10-CM | POA: Diagnosis not present

## 2020-05-06 DIAGNOSIS — Z992 Dependence on renal dialysis: Secondary | ICD-10-CM | POA: Diagnosis not present

## 2020-05-06 DIAGNOSIS — D631 Anemia in chronic kidney disease: Secondary | ICD-10-CM | POA: Diagnosis not present

## 2020-05-06 DIAGNOSIS — D509 Iron deficiency anemia, unspecified: Secondary | ICD-10-CM | POA: Diagnosis not present

## 2020-05-06 DIAGNOSIS — N186 End stage renal disease: Secondary | ICD-10-CM | POA: Diagnosis not present

## 2020-05-07 DIAGNOSIS — M169 Osteoarthritis of hip, unspecified: Secondary | ICD-10-CM | POA: Diagnosis not present

## 2020-05-07 DIAGNOSIS — M47816 Spondylosis without myelopathy or radiculopathy, lumbar region: Secondary | ICD-10-CM | POA: Diagnosis not present

## 2020-05-07 DIAGNOSIS — M25561 Pain in right knee: Secondary | ICD-10-CM | POA: Diagnosis not present

## 2020-05-07 DIAGNOSIS — T8789 Other complications of amputation stump: Secondary | ICD-10-CM | POA: Diagnosis not present

## 2020-05-08 DIAGNOSIS — D509 Iron deficiency anemia, unspecified: Secondary | ICD-10-CM | POA: Diagnosis not present

## 2020-05-08 DIAGNOSIS — D631 Anemia in chronic kidney disease: Secondary | ICD-10-CM | POA: Diagnosis not present

## 2020-05-08 DIAGNOSIS — N186 End stage renal disease: Secondary | ICD-10-CM | POA: Diagnosis not present

## 2020-05-08 DIAGNOSIS — Z992 Dependence on renal dialysis: Secondary | ICD-10-CM | POA: Diagnosis not present

## 2020-05-11 DIAGNOSIS — N186 End stage renal disease: Secondary | ICD-10-CM | POA: Diagnosis not present

## 2020-05-11 DIAGNOSIS — D509 Iron deficiency anemia, unspecified: Secondary | ICD-10-CM | POA: Diagnosis not present

## 2020-05-11 DIAGNOSIS — D631 Anemia in chronic kidney disease: Secondary | ICD-10-CM | POA: Diagnosis not present

## 2020-05-11 DIAGNOSIS — Z992 Dependence on renal dialysis: Secondary | ICD-10-CM | POA: Diagnosis not present

## 2020-05-13 DIAGNOSIS — Z992 Dependence on renal dialysis: Secondary | ICD-10-CM | POA: Diagnosis not present

## 2020-05-13 DIAGNOSIS — D509 Iron deficiency anemia, unspecified: Secondary | ICD-10-CM | POA: Diagnosis not present

## 2020-05-13 DIAGNOSIS — D631 Anemia in chronic kidney disease: Secondary | ICD-10-CM | POA: Diagnosis not present

## 2020-05-13 DIAGNOSIS — N186 End stage renal disease: Secondary | ICD-10-CM | POA: Diagnosis not present

## 2020-05-14 DIAGNOSIS — E1142 Type 2 diabetes mellitus with diabetic polyneuropathy: Secondary | ICD-10-CM | POA: Diagnosis not present

## 2020-05-14 DIAGNOSIS — I10 Essential (primary) hypertension: Secondary | ICD-10-CM | POA: Diagnosis not present

## 2020-05-15 DIAGNOSIS — D509 Iron deficiency anemia, unspecified: Secondary | ICD-10-CM | POA: Diagnosis not present

## 2020-05-15 DIAGNOSIS — Z992 Dependence on renal dialysis: Secondary | ICD-10-CM | POA: Diagnosis not present

## 2020-05-15 DIAGNOSIS — D631 Anemia in chronic kidney disease: Secondary | ICD-10-CM | POA: Diagnosis not present

## 2020-05-15 DIAGNOSIS — N186 End stage renal disease: Secondary | ICD-10-CM | POA: Diagnosis not present

## 2020-05-16 DIAGNOSIS — N186 End stage renal disease: Secondary | ICD-10-CM | POA: Diagnosis not present

## 2020-05-16 DIAGNOSIS — Z992 Dependence on renal dialysis: Secondary | ICD-10-CM | POA: Diagnosis not present

## 2020-05-17 DIAGNOSIS — T50904A Poisoning by unspecified drugs, medicaments and biological substances, undetermined, initial encounter: Secondary | ICD-10-CM | POA: Diagnosis not present

## 2020-05-17 DIAGNOSIS — R0689 Other abnormalities of breathing: Secondary | ICD-10-CM | POA: Diagnosis not present

## 2020-05-17 DIAGNOSIS — R52 Pain, unspecified: Secondary | ICD-10-CM | POA: Diagnosis not present

## 2020-05-17 DIAGNOSIS — T887XXA Unspecified adverse effect of drug or medicament, initial encounter: Secondary | ICD-10-CM | POA: Diagnosis not present

## 2020-05-17 DIAGNOSIS — I10 Essential (primary) hypertension: Secondary | ICD-10-CM | POA: Diagnosis not present

## 2020-05-18 DIAGNOSIS — E1122 Type 2 diabetes mellitus with diabetic chronic kidney disease: Secondary | ICD-10-CM | POA: Diagnosis not present

## 2020-05-18 DIAGNOSIS — R001 Bradycardia, unspecified: Secondary | ICD-10-CM | POA: Diagnosis not present

## 2020-05-18 DIAGNOSIS — F17210 Nicotine dependence, cigarettes, uncomplicated: Secondary | ICD-10-CM | POA: Diagnosis not present

## 2020-05-18 DIAGNOSIS — Z89611 Acquired absence of right leg above knee: Secondary | ICD-10-CM | POA: Diagnosis not present

## 2020-05-18 DIAGNOSIS — R918 Other nonspecific abnormal finding of lung field: Secondary | ICD-10-CM | POA: Diagnosis not present

## 2020-05-18 DIAGNOSIS — Z992 Dependence on renal dialysis: Secondary | ICD-10-CM | POA: Diagnosis not present

## 2020-05-18 DIAGNOSIS — N186 End stage renal disease: Secondary | ICD-10-CM | POA: Diagnosis not present

## 2020-05-18 DIAGNOSIS — R079 Chest pain, unspecified: Secondary | ICD-10-CM | POA: Diagnosis not present

## 2020-05-18 DIAGNOSIS — I2510 Atherosclerotic heart disease of native coronary artery without angina pectoris: Secondary | ICD-10-CM | POA: Diagnosis not present

## 2020-05-18 DIAGNOSIS — I252 Old myocardial infarction: Secondary | ICD-10-CM | POA: Diagnosis not present

## 2020-05-18 DIAGNOSIS — I517 Cardiomegaly: Secondary | ICD-10-CM | POA: Diagnosis not present

## 2020-05-18 DIAGNOSIS — Z89612 Acquired absence of left leg above knee: Secondary | ICD-10-CM | POA: Diagnosis not present

## 2020-05-18 DIAGNOSIS — E1142 Type 2 diabetes mellitus with diabetic polyneuropathy: Secondary | ICD-10-CM | POA: Diagnosis not present

## 2020-05-18 DIAGNOSIS — E1151 Type 2 diabetes mellitus with diabetic peripheral angiopathy without gangrene: Secondary | ICD-10-CM | POA: Diagnosis not present

## 2020-05-18 DIAGNOSIS — I120 Hypertensive chronic kidney disease with stage 5 chronic kidney disease or end stage renal disease: Secondary | ICD-10-CM | POA: Diagnosis not present

## 2020-05-18 DIAGNOSIS — Z743 Need for continuous supervision: Secondary | ICD-10-CM | POA: Diagnosis not present

## 2020-05-18 DIAGNOSIS — I10 Essential (primary) hypertension: Secondary | ICD-10-CM | POA: Diagnosis not present

## 2020-05-18 DIAGNOSIS — T402X1A Poisoning by other opioids, accidental (unintentional), initial encounter: Secondary | ICD-10-CM | POA: Diagnosis not present

## 2020-05-19 DIAGNOSIS — D509 Iron deficiency anemia, unspecified: Secondary | ICD-10-CM | POA: Diagnosis not present

## 2020-05-19 DIAGNOSIS — N186 End stage renal disease: Secondary | ICD-10-CM | POA: Diagnosis not present

## 2020-05-19 DIAGNOSIS — Z992 Dependence on renal dialysis: Secondary | ICD-10-CM | POA: Diagnosis not present

## 2020-05-20 DIAGNOSIS — Z992 Dependence on renal dialysis: Secondary | ICD-10-CM | POA: Diagnosis not present

## 2020-05-20 DIAGNOSIS — D509 Iron deficiency anemia, unspecified: Secondary | ICD-10-CM | POA: Diagnosis not present

## 2020-05-20 DIAGNOSIS — N186 End stage renal disease: Secondary | ICD-10-CM | POA: Diagnosis not present

## 2020-05-22 DIAGNOSIS — D509 Iron deficiency anemia, unspecified: Secondary | ICD-10-CM | POA: Diagnosis not present

## 2020-05-22 DIAGNOSIS — Z992 Dependence on renal dialysis: Secondary | ICD-10-CM | POA: Diagnosis not present

## 2020-05-22 DIAGNOSIS — N186 End stage renal disease: Secondary | ICD-10-CM | POA: Diagnosis not present

## 2020-05-25 DIAGNOSIS — N186 End stage renal disease: Secondary | ICD-10-CM | POA: Diagnosis not present

## 2020-05-25 DIAGNOSIS — Z992 Dependence on renal dialysis: Secondary | ICD-10-CM | POA: Diagnosis not present

## 2020-05-25 DIAGNOSIS — D509 Iron deficiency anemia, unspecified: Secondary | ICD-10-CM | POA: Diagnosis not present

## 2020-05-27 DIAGNOSIS — D509 Iron deficiency anemia, unspecified: Secondary | ICD-10-CM | POA: Diagnosis not present

## 2020-05-27 DIAGNOSIS — Z992 Dependence on renal dialysis: Secondary | ICD-10-CM | POA: Diagnosis not present

## 2020-05-27 DIAGNOSIS — N186 End stage renal disease: Secondary | ICD-10-CM | POA: Diagnosis not present

## 2020-05-29 DIAGNOSIS — N186 End stage renal disease: Secondary | ICD-10-CM | POA: Diagnosis not present

## 2020-05-29 DIAGNOSIS — D509 Iron deficiency anemia, unspecified: Secondary | ICD-10-CM | POA: Diagnosis not present

## 2020-05-29 DIAGNOSIS — Z992 Dependence on renal dialysis: Secondary | ICD-10-CM | POA: Diagnosis not present

## 2020-06-01 DIAGNOSIS — N186 End stage renal disease: Secondary | ICD-10-CM | POA: Diagnosis not present

## 2020-06-01 DIAGNOSIS — D509 Iron deficiency anemia, unspecified: Secondary | ICD-10-CM | POA: Diagnosis not present

## 2020-06-01 DIAGNOSIS — Z992 Dependence on renal dialysis: Secondary | ICD-10-CM | POA: Diagnosis not present

## 2020-06-02 ENCOUNTER — Ambulatory Visit: Payer: Medicare Other | Admitting: Cardiovascular Disease

## 2020-06-03 DIAGNOSIS — Z992 Dependence on renal dialysis: Secondary | ICD-10-CM | POA: Diagnosis not present

## 2020-06-03 DIAGNOSIS — D509 Iron deficiency anemia, unspecified: Secondary | ICD-10-CM | POA: Diagnosis not present

## 2020-06-03 DIAGNOSIS — N186 End stage renal disease: Secondary | ICD-10-CM | POA: Diagnosis not present

## 2020-06-04 ENCOUNTER — Ambulatory Visit: Payer: Medicare Other | Admitting: Student

## 2020-06-05 DIAGNOSIS — N186 End stage renal disease: Secondary | ICD-10-CM | POA: Diagnosis not present

## 2020-06-05 DIAGNOSIS — Z992 Dependence on renal dialysis: Secondary | ICD-10-CM | POA: Diagnosis not present

## 2020-06-05 DIAGNOSIS — D509 Iron deficiency anemia, unspecified: Secondary | ICD-10-CM | POA: Diagnosis not present

## 2020-06-08 DIAGNOSIS — Z992 Dependence on renal dialysis: Secondary | ICD-10-CM | POA: Diagnosis not present

## 2020-06-08 DIAGNOSIS — D509 Iron deficiency anemia, unspecified: Secondary | ICD-10-CM | POA: Diagnosis not present

## 2020-06-08 DIAGNOSIS — N186 End stage renal disease: Secondary | ICD-10-CM | POA: Diagnosis not present

## 2020-06-10 DIAGNOSIS — Z992 Dependence on renal dialysis: Secondary | ICD-10-CM | POA: Diagnosis not present

## 2020-06-10 DIAGNOSIS — N186 End stage renal disease: Secondary | ICD-10-CM | POA: Diagnosis not present

## 2020-06-10 DIAGNOSIS — D509 Iron deficiency anemia, unspecified: Secondary | ICD-10-CM | POA: Diagnosis not present

## 2020-06-11 DIAGNOSIS — M47816 Spondylosis without myelopathy or radiculopathy, lumbar region: Secondary | ICD-10-CM | POA: Diagnosis not present

## 2020-06-11 DIAGNOSIS — T8789 Other complications of amputation stump: Secondary | ICD-10-CM | POA: Diagnosis not present

## 2020-06-11 DIAGNOSIS — M25561 Pain in right knee: Secondary | ICD-10-CM | POA: Diagnosis not present

## 2020-06-11 DIAGNOSIS — E1142 Type 2 diabetes mellitus with diabetic polyneuropathy: Secondary | ICD-10-CM | POA: Diagnosis not present

## 2020-06-11 DIAGNOSIS — M169 Osteoarthritis of hip, unspecified: Secondary | ICD-10-CM | POA: Diagnosis not present

## 2020-06-11 DIAGNOSIS — I10 Essential (primary) hypertension: Secondary | ICD-10-CM | POA: Diagnosis not present

## 2020-06-12 DIAGNOSIS — N186 End stage renal disease: Secondary | ICD-10-CM | POA: Diagnosis not present

## 2020-06-12 DIAGNOSIS — D509 Iron deficiency anemia, unspecified: Secondary | ICD-10-CM | POA: Diagnosis not present

## 2020-06-12 DIAGNOSIS — Z992 Dependence on renal dialysis: Secondary | ICD-10-CM | POA: Diagnosis not present

## 2020-06-15 DIAGNOSIS — D509 Iron deficiency anemia, unspecified: Secondary | ICD-10-CM | POA: Diagnosis not present

## 2020-06-15 DIAGNOSIS — Z992 Dependence on renal dialysis: Secondary | ICD-10-CM | POA: Diagnosis not present

## 2020-06-15 DIAGNOSIS — N186 End stage renal disease: Secondary | ICD-10-CM | POA: Diagnosis not present

## 2020-06-16 DIAGNOSIS — N186 End stage renal disease: Secondary | ICD-10-CM | POA: Diagnosis not present

## 2020-06-16 DIAGNOSIS — Z992 Dependence on renal dialysis: Secondary | ICD-10-CM | POA: Diagnosis not present

## 2020-06-17 DIAGNOSIS — N2581 Secondary hyperparathyroidism of renal origin: Secondary | ICD-10-CM | POA: Diagnosis not present

## 2020-06-17 DIAGNOSIS — D509 Iron deficiency anemia, unspecified: Secondary | ICD-10-CM | POA: Diagnosis not present

## 2020-06-17 DIAGNOSIS — Z992 Dependence on renal dialysis: Secondary | ICD-10-CM | POA: Diagnosis not present

## 2020-06-17 DIAGNOSIS — N186 End stage renal disease: Secondary | ICD-10-CM | POA: Diagnosis not present

## 2020-06-19 DIAGNOSIS — D509 Iron deficiency anemia, unspecified: Secondary | ICD-10-CM | POA: Diagnosis not present

## 2020-06-19 DIAGNOSIS — N2581 Secondary hyperparathyroidism of renal origin: Secondary | ICD-10-CM | POA: Diagnosis not present

## 2020-06-19 DIAGNOSIS — N186 End stage renal disease: Secondary | ICD-10-CM | POA: Diagnosis not present

## 2020-06-19 DIAGNOSIS — Z992 Dependence on renal dialysis: Secondary | ICD-10-CM | POA: Diagnosis not present

## 2020-06-22 DIAGNOSIS — N2581 Secondary hyperparathyroidism of renal origin: Secondary | ICD-10-CM | POA: Diagnosis not present

## 2020-06-22 DIAGNOSIS — D509 Iron deficiency anemia, unspecified: Secondary | ICD-10-CM | POA: Diagnosis not present

## 2020-06-22 DIAGNOSIS — Z992 Dependence on renal dialysis: Secondary | ICD-10-CM | POA: Diagnosis not present

## 2020-06-22 DIAGNOSIS — N186 End stage renal disease: Secondary | ICD-10-CM | POA: Diagnosis not present

## 2020-06-24 DIAGNOSIS — N186 End stage renal disease: Secondary | ICD-10-CM | POA: Diagnosis not present

## 2020-06-24 DIAGNOSIS — N2581 Secondary hyperparathyroidism of renal origin: Secondary | ICD-10-CM | POA: Diagnosis not present

## 2020-06-24 DIAGNOSIS — Z992 Dependence on renal dialysis: Secondary | ICD-10-CM | POA: Diagnosis not present

## 2020-06-24 DIAGNOSIS — D509 Iron deficiency anemia, unspecified: Secondary | ICD-10-CM | POA: Diagnosis not present

## 2020-06-26 DIAGNOSIS — Z992 Dependence on renal dialysis: Secondary | ICD-10-CM | POA: Diagnosis not present

## 2020-06-26 DIAGNOSIS — N186 End stage renal disease: Secondary | ICD-10-CM | POA: Diagnosis not present

## 2020-06-26 DIAGNOSIS — N2581 Secondary hyperparathyroidism of renal origin: Secondary | ICD-10-CM | POA: Diagnosis not present

## 2020-06-26 DIAGNOSIS — D509 Iron deficiency anemia, unspecified: Secondary | ICD-10-CM | POA: Diagnosis not present

## 2020-06-29 DIAGNOSIS — Z992 Dependence on renal dialysis: Secondary | ICD-10-CM | POA: Diagnosis not present

## 2020-06-29 DIAGNOSIS — D509 Iron deficiency anemia, unspecified: Secondary | ICD-10-CM | POA: Diagnosis not present

## 2020-06-29 DIAGNOSIS — N2581 Secondary hyperparathyroidism of renal origin: Secondary | ICD-10-CM | POA: Diagnosis not present

## 2020-06-29 DIAGNOSIS — N186 End stage renal disease: Secondary | ICD-10-CM | POA: Diagnosis not present

## 2020-07-01 DIAGNOSIS — N2581 Secondary hyperparathyroidism of renal origin: Secondary | ICD-10-CM | POA: Diagnosis not present

## 2020-07-01 DIAGNOSIS — D509 Iron deficiency anemia, unspecified: Secondary | ICD-10-CM | POA: Diagnosis not present

## 2020-07-01 DIAGNOSIS — Z992 Dependence on renal dialysis: Secondary | ICD-10-CM | POA: Diagnosis not present

## 2020-07-01 DIAGNOSIS — N186 End stage renal disease: Secondary | ICD-10-CM | POA: Diagnosis not present

## 2020-07-03 DIAGNOSIS — N2581 Secondary hyperparathyroidism of renal origin: Secondary | ICD-10-CM | POA: Diagnosis not present

## 2020-07-03 DIAGNOSIS — N186 End stage renal disease: Secondary | ICD-10-CM | POA: Diagnosis not present

## 2020-07-03 DIAGNOSIS — D509 Iron deficiency anemia, unspecified: Secondary | ICD-10-CM | POA: Diagnosis not present

## 2020-07-03 DIAGNOSIS — Z992 Dependence on renal dialysis: Secondary | ICD-10-CM | POA: Diagnosis not present

## 2020-07-06 DIAGNOSIS — N2581 Secondary hyperparathyroidism of renal origin: Secondary | ICD-10-CM | POA: Diagnosis not present

## 2020-07-06 DIAGNOSIS — N186 End stage renal disease: Secondary | ICD-10-CM | POA: Diagnosis not present

## 2020-07-06 DIAGNOSIS — Z992 Dependence on renal dialysis: Secondary | ICD-10-CM | POA: Diagnosis not present

## 2020-07-06 DIAGNOSIS — D509 Iron deficiency anemia, unspecified: Secondary | ICD-10-CM | POA: Diagnosis not present

## 2020-07-07 DIAGNOSIS — E1121 Type 2 diabetes mellitus with diabetic nephropathy: Secondary | ICD-10-CM | POA: Diagnosis not present

## 2020-07-07 DIAGNOSIS — N185 Chronic kidney disease, stage 5: Secondary | ICD-10-CM | POA: Diagnosis not present

## 2020-07-07 DIAGNOSIS — Z89511 Acquired absence of right leg below knee: Secondary | ICD-10-CM | POA: Diagnosis not present

## 2020-07-07 DIAGNOSIS — E119 Type 2 diabetes mellitus without complications: Secondary | ICD-10-CM | POA: Diagnosis not present

## 2020-07-07 DIAGNOSIS — I739 Peripheral vascular disease, unspecified: Secondary | ICD-10-CM | POA: Diagnosis not present

## 2020-07-07 DIAGNOSIS — Z89612 Acquired absence of left leg above knee: Secondary | ICD-10-CM | POA: Diagnosis not present

## 2020-07-07 DIAGNOSIS — G35 Multiple sclerosis: Secondary | ICD-10-CM | POA: Diagnosis not present

## 2020-07-07 DIAGNOSIS — S88912D Complete traumatic amputation of left lower leg, level unspecified, subsequent encounter: Secondary | ICD-10-CM | POA: Diagnosis not present

## 2020-07-08 DIAGNOSIS — Z992 Dependence on renal dialysis: Secondary | ICD-10-CM | POA: Diagnosis not present

## 2020-07-08 DIAGNOSIS — N2581 Secondary hyperparathyroidism of renal origin: Secondary | ICD-10-CM | POA: Diagnosis not present

## 2020-07-08 DIAGNOSIS — D509 Iron deficiency anemia, unspecified: Secondary | ICD-10-CM | POA: Diagnosis not present

## 2020-07-08 DIAGNOSIS — N186 End stage renal disease: Secondary | ICD-10-CM | POA: Diagnosis not present

## 2020-07-09 DIAGNOSIS — T8789 Other complications of amputation stump: Secondary | ICD-10-CM | POA: Diagnosis not present

## 2020-07-09 DIAGNOSIS — M47816 Spondylosis without myelopathy or radiculopathy, lumbar region: Secondary | ICD-10-CM | POA: Diagnosis not present

## 2020-07-09 DIAGNOSIS — M25561 Pain in right knee: Secondary | ICD-10-CM | POA: Diagnosis not present

## 2020-07-09 DIAGNOSIS — G894 Chronic pain syndrome: Secondary | ICD-10-CM | POA: Diagnosis not present

## 2020-07-09 DIAGNOSIS — E1142 Type 2 diabetes mellitus with diabetic polyneuropathy: Secondary | ICD-10-CM | POA: Diagnosis not present

## 2020-07-09 DIAGNOSIS — I10 Essential (primary) hypertension: Secondary | ICD-10-CM | POA: Diagnosis not present

## 2020-07-10 DIAGNOSIS — N186 End stage renal disease: Secondary | ICD-10-CM | POA: Diagnosis not present

## 2020-07-10 DIAGNOSIS — Z992 Dependence on renal dialysis: Secondary | ICD-10-CM | POA: Diagnosis not present

## 2020-07-10 DIAGNOSIS — D509 Iron deficiency anemia, unspecified: Secondary | ICD-10-CM | POA: Diagnosis not present

## 2020-07-10 DIAGNOSIS — N2581 Secondary hyperparathyroidism of renal origin: Secondary | ICD-10-CM | POA: Diagnosis not present

## 2020-07-13 DIAGNOSIS — E109 Type 1 diabetes mellitus without complications: Secondary | ICD-10-CM | POA: Diagnosis not present

## 2020-07-13 DIAGNOSIS — Z992 Dependence on renal dialysis: Secondary | ICD-10-CM | POA: Diagnosis not present

## 2020-07-13 DIAGNOSIS — N2581 Secondary hyperparathyroidism of renal origin: Secondary | ICD-10-CM | POA: Diagnosis not present

## 2020-07-13 DIAGNOSIS — N186 End stage renal disease: Secondary | ICD-10-CM | POA: Diagnosis not present

## 2020-07-13 DIAGNOSIS — D509 Iron deficiency anemia, unspecified: Secondary | ICD-10-CM | POA: Diagnosis not present

## 2020-07-13 DIAGNOSIS — Z794 Long term (current) use of insulin: Secondary | ICD-10-CM | POA: Diagnosis not present

## 2020-07-15 ENCOUNTER — Ambulatory Visit (HOSPITAL_COMMUNITY): Payer: Medicare Other | Admitting: Physical Therapy

## 2020-07-15 DIAGNOSIS — D509 Iron deficiency anemia, unspecified: Secondary | ICD-10-CM | POA: Diagnosis not present

## 2020-07-15 DIAGNOSIS — N186 End stage renal disease: Secondary | ICD-10-CM | POA: Diagnosis not present

## 2020-07-15 DIAGNOSIS — Z992 Dependence on renal dialysis: Secondary | ICD-10-CM | POA: Diagnosis not present

## 2020-07-15 DIAGNOSIS — N2581 Secondary hyperparathyroidism of renal origin: Secondary | ICD-10-CM | POA: Diagnosis not present

## 2020-07-16 ENCOUNTER — Ambulatory Visit (HOSPITAL_COMMUNITY): Payer: Medicare Other | Admitting: Physical Therapy

## 2020-07-16 DIAGNOSIS — Z992 Dependence on renal dialysis: Secondary | ICD-10-CM | POA: Diagnosis not present

## 2020-07-16 DIAGNOSIS — N186 End stage renal disease: Secondary | ICD-10-CM | POA: Diagnosis not present

## 2020-07-17 DIAGNOSIS — D509 Iron deficiency anemia, unspecified: Secondary | ICD-10-CM | POA: Diagnosis not present

## 2020-07-17 DIAGNOSIS — Z23 Encounter for immunization: Secondary | ICD-10-CM | POA: Diagnosis not present

## 2020-07-17 DIAGNOSIS — N186 End stage renal disease: Secondary | ICD-10-CM | POA: Diagnosis not present

## 2020-07-17 DIAGNOSIS — Z992 Dependence on renal dialysis: Secondary | ICD-10-CM | POA: Diagnosis not present

## 2020-07-17 DIAGNOSIS — N2581 Secondary hyperparathyroidism of renal origin: Secondary | ICD-10-CM | POA: Diagnosis not present

## 2020-07-20 DIAGNOSIS — D509 Iron deficiency anemia, unspecified: Secondary | ICD-10-CM | POA: Diagnosis not present

## 2020-07-20 DIAGNOSIS — N2581 Secondary hyperparathyroidism of renal origin: Secondary | ICD-10-CM | POA: Diagnosis not present

## 2020-07-20 DIAGNOSIS — Z23 Encounter for immunization: Secondary | ICD-10-CM | POA: Diagnosis not present

## 2020-07-20 DIAGNOSIS — Z992 Dependence on renal dialysis: Secondary | ICD-10-CM | POA: Diagnosis not present

## 2020-07-20 DIAGNOSIS — N186 End stage renal disease: Secondary | ICD-10-CM | POA: Diagnosis not present

## 2020-07-22 DIAGNOSIS — Z992 Dependence on renal dialysis: Secondary | ICD-10-CM | POA: Diagnosis not present

## 2020-07-22 DIAGNOSIS — N2581 Secondary hyperparathyroidism of renal origin: Secondary | ICD-10-CM | POA: Diagnosis not present

## 2020-07-22 DIAGNOSIS — D509 Iron deficiency anemia, unspecified: Secondary | ICD-10-CM | POA: Diagnosis not present

## 2020-07-22 DIAGNOSIS — Z23 Encounter for immunization: Secondary | ICD-10-CM | POA: Diagnosis not present

## 2020-07-22 DIAGNOSIS — N186 End stage renal disease: Secondary | ICD-10-CM | POA: Diagnosis not present

## 2020-07-23 ENCOUNTER — Ambulatory Visit (HOSPITAL_COMMUNITY): Payer: Medicare Other | Admitting: Physical Therapy

## 2020-07-24 ENCOUNTER — Ambulatory Visit (HOSPITAL_COMMUNITY): Payer: Medicare Other | Admitting: Physical Therapy

## 2020-07-24 DIAGNOSIS — N2581 Secondary hyperparathyroidism of renal origin: Secondary | ICD-10-CM | POA: Diagnosis not present

## 2020-07-24 DIAGNOSIS — N186 End stage renal disease: Secondary | ICD-10-CM | POA: Diagnosis not present

## 2020-07-24 DIAGNOSIS — Z992 Dependence on renal dialysis: Secondary | ICD-10-CM | POA: Diagnosis not present

## 2020-07-24 DIAGNOSIS — D509 Iron deficiency anemia, unspecified: Secondary | ICD-10-CM | POA: Diagnosis not present

## 2020-07-24 DIAGNOSIS — Z23 Encounter for immunization: Secondary | ICD-10-CM | POA: Diagnosis not present

## 2020-07-27 DIAGNOSIS — N186 End stage renal disease: Secondary | ICD-10-CM | POA: Diagnosis not present

## 2020-07-27 DIAGNOSIS — Z23 Encounter for immunization: Secondary | ICD-10-CM | POA: Diagnosis not present

## 2020-07-27 DIAGNOSIS — D509 Iron deficiency anemia, unspecified: Secondary | ICD-10-CM | POA: Diagnosis not present

## 2020-07-27 DIAGNOSIS — Z992 Dependence on renal dialysis: Secondary | ICD-10-CM | POA: Diagnosis not present

## 2020-07-27 DIAGNOSIS — N2581 Secondary hyperparathyroidism of renal origin: Secondary | ICD-10-CM | POA: Diagnosis not present

## 2020-07-29 DIAGNOSIS — Z23 Encounter for immunization: Secondary | ICD-10-CM | POA: Diagnosis not present

## 2020-07-29 DIAGNOSIS — N186 End stage renal disease: Secondary | ICD-10-CM | POA: Diagnosis not present

## 2020-07-29 DIAGNOSIS — Z79891 Long term (current) use of opiate analgesic: Secondary | ICD-10-CM | POA: Diagnosis not present

## 2020-07-29 DIAGNOSIS — G894 Chronic pain syndrome: Secondary | ICD-10-CM | POA: Diagnosis not present

## 2020-07-29 DIAGNOSIS — Z992 Dependence on renal dialysis: Secondary | ICD-10-CM | POA: Diagnosis not present

## 2020-07-29 DIAGNOSIS — D509 Iron deficiency anemia, unspecified: Secondary | ICD-10-CM | POA: Diagnosis not present

## 2020-07-29 DIAGNOSIS — N2581 Secondary hyperparathyroidism of renal origin: Secondary | ICD-10-CM | POA: Diagnosis not present

## 2020-07-30 DIAGNOSIS — I10 Essential (primary) hypertension: Secondary | ICD-10-CM | POA: Diagnosis not present

## 2020-07-30 DIAGNOSIS — E1142 Type 2 diabetes mellitus with diabetic polyneuropathy: Secondary | ICD-10-CM | POA: Diagnosis not present

## 2020-07-31 DIAGNOSIS — D509 Iron deficiency anemia, unspecified: Secondary | ICD-10-CM | POA: Diagnosis not present

## 2020-07-31 DIAGNOSIS — N186 End stage renal disease: Secondary | ICD-10-CM | POA: Diagnosis not present

## 2020-07-31 DIAGNOSIS — N2581 Secondary hyperparathyroidism of renal origin: Secondary | ICD-10-CM | POA: Diagnosis not present

## 2020-07-31 DIAGNOSIS — Z992 Dependence on renal dialysis: Secondary | ICD-10-CM | POA: Diagnosis not present

## 2020-07-31 DIAGNOSIS — Z23 Encounter for immunization: Secondary | ICD-10-CM | POA: Diagnosis not present

## 2020-08-03 DIAGNOSIS — Z23 Encounter for immunization: Secondary | ICD-10-CM | POA: Diagnosis not present

## 2020-08-03 DIAGNOSIS — N2581 Secondary hyperparathyroidism of renal origin: Secondary | ICD-10-CM | POA: Diagnosis not present

## 2020-08-03 DIAGNOSIS — Z992 Dependence on renal dialysis: Secondary | ICD-10-CM | POA: Diagnosis not present

## 2020-08-03 DIAGNOSIS — N186 End stage renal disease: Secondary | ICD-10-CM | POA: Diagnosis not present

## 2020-08-03 DIAGNOSIS — D509 Iron deficiency anemia, unspecified: Secondary | ICD-10-CM | POA: Diagnosis not present

## 2020-08-05 DIAGNOSIS — Z23 Encounter for immunization: Secondary | ICD-10-CM | POA: Diagnosis not present

## 2020-08-05 DIAGNOSIS — N186 End stage renal disease: Secondary | ICD-10-CM | POA: Diagnosis not present

## 2020-08-05 DIAGNOSIS — D509 Iron deficiency anemia, unspecified: Secondary | ICD-10-CM | POA: Diagnosis not present

## 2020-08-05 DIAGNOSIS — Z992 Dependence on renal dialysis: Secondary | ICD-10-CM | POA: Diagnosis not present

## 2020-08-05 DIAGNOSIS — N2581 Secondary hyperparathyroidism of renal origin: Secondary | ICD-10-CM | POA: Diagnosis not present

## 2020-08-07 DIAGNOSIS — D509 Iron deficiency anemia, unspecified: Secondary | ICD-10-CM | POA: Diagnosis not present

## 2020-08-07 DIAGNOSIS — Z23 Encounter for immunization: Secondary | ICD-10-CM | POA: Diagnosis not present

## 2020-08-07 DIAGNOSIS — Z992 Dependence on renal dialysis: Secondary | ICD-10-CM | POA: Diagnosis not present

## 2020-08-07 DIAGNOSIS — N186 End stage renal disease: Secondary | ICD-10-CM | POA: Diagnosis not present

## 2020-08-07 DIAGNOSIS — N2581 Secondary hyperparathyroidism of renal origin: Secondary | ICD-10-CM | POA: Diagnosis not present

## 2020-08-10 DIAGNOSIS — D509 Iron deficiency anemia, unspecified: Secondary | ICD-10-CM | POA: Diagnosis not present

## 2020-08-10 DIAGNOSIS — N2581 Secondary hyperparathyroidism of renal origin: Secondary | ICD-10-CM | POA: Diagnosis not present

## 2020-08-10 DIAGNOSIS — Z992 Dependence on renal dialysis: Secondary | ICD-10-CM | POA: Diagnosis not present

## 2020-08-10 DIAGNOSIS — Z23 Encounter for immunization: Secondary | ICD-10-CM | POA: Diagnosis not present

## 2020-08-10 DIAGNOSIS — N186 End stage renal disease: Secondary | ICD-10-CM | POA: Diagnosis not present

## 2020-08-11 ENCOUNTER — Ambulatory Visit (HOSPITAL_COMMUNITY): Payer: Medicare Other | Attending: Internal Medicine | Admitting: Physical Therapy

## 2020-08-11 ENCOUNTER — Encounter (HOSPITAL_COMMUNITY): Payer: Self-pay | Admitting: Physical Therapy

## 2020-08-11 ENCOUNTER — Other Ambulatory Visit: Payer: Self-pay

## 2020-08-11 DIAGNOSIS — S31000A Unspecified open wound of lower back and pelvis without penetration into retroperitoneum, initial encounter: Secondary | ICD-10-CM | POA: Insufficient documentation

## 2020-08-11 NOTE — Therapy (Signed)
[place], [place], [address] Phone: [phone]   Fax:  [phone]  Patient Details  Name: Angela Soto MRN: [id number] Date of Birth: Apr 27, 1971 Referring Provider:  Neale Burly, MD  Encounter Date: 08/11/2020   Pt states that since she has been referred to skilled therapy for her sacral wound it has healed. PT is no longer in need of skilled care.     Rayetta Humphrey, PT CLT [phone] 08/11/2020, 11:04 AM  [place], [place], [address] Phone: [phone]   Fax:  [phone]

## 2020-08-12 DIAGNOSIS — N186 End stage renal disease: Secondary | ICD-10-CM | POA: Diagnosis not present

## 2020-08-12 DIAGNOSIS — D509 Iron deficiency anemia, unspecified: Secondary | ICD-10-CM | POA: Diagnosis not present

## 2020-08-12 DIAGNOSIS — Z992 Dependence on renal dialysis: Secondary | ICD-10-CM | POA: Diagnosis not present

## 2020-08-12 DIAGNOSIS — Z23 Encounter for immunization: Secondary | ICD-10-CM | POA: Diagnosis not present

## 2020-08-12 DIAGNOSIS — N2581 Secondary hyperparathyroidism of renal origin: Secondary | ICD-10-CM | POA: Diagnosis not present

## 2020-08-14 DIAGNOSIS — D509 Iron deficiency anemia, unspecified: Secondary | ICD-10-CM | POA: Diagnosis not present

## 2020-08-14 DIAGNOSIS — Z992 Dependence on renal dialysis: Secondary | ICD-10-CM | POA: Diagnosis not present

## 2020-08-14 DIAGNOSIS — N2581 Secondary hyperparathyroidism of renal origin: Secondary | ICD-10-CM | POA: Diagnosis not present

## 2020-08-14 DIAGNOSIS — N186 End stage renal disease: Secondary | ICD-10-CM | POA: Diagnosis not present

## 2020-08-14 DIAGNOSIS — Z23 Encounter for immunization: Secondary | ICD-10-CM | POA: Diagnosis not present

## 2020-08-16 DIAGNOSIS — N186 End stage renal disease: Secondary | ICD-10-CM | POA: Diagnosis not present

## 2020-08-16 DIAGNOSIS — Z992 Dependence on renal dialysis: Secondary | ICD-10-CM | POA: Diagnosis not present

## 2020-08-17 DIAGNOSIS — E209 Hypoparathyroidism, unspecified: Secondary | ICD-10-CM | POA: Diagnosis not present

## 2020-08-17 DIAGNOSIS — Z992 Dependence on renal dialysis: Secondary | ICD-10-CM | POA: Diagnosis not present

## 2020-08-17 DIAGNOSIS — N186 End stage renal disease: Secondary | ICD-10-CM | POA: Diagnosis not present

## 2020-08-17 DIAGNOSIS — D509 Iron deficiency anemia, unspecified: Secondary | ICD-10-CM | POA: Diagnosis not present

## 2020-08-17 NOTE — Progress Notes (Signed)
Cardiology Office Note    Date:  08/18/2020   ID:  TERI LEGACY, DOB 02-02-1971, MRN [id number]  PCP:  Neale Burly, MD  Cardiologist: Kate Sable, MD (Inactive)  --> Will switch to new MD at her next visit  Chief Complaint  Patient presents with  . Follow-up    6 month visit    History of Present Illness:    Angela Soto is a 49 y.o. female with past medical history of CAD (s/p prior PCI in 1998, recent catheterization in 10/2017 showing severe two-vessel CAD with heavy calcification along the LCx and 100% CTO of RCA with left to right collaterals noted -->medical management recommended as not felt to be a CABG candidate), ESRD (on HD - MWF), HTN, HLD, Hypothyroidism, IDDM, PVD (s/p bilateral BKA with left AKA in 05/2019) and tobacco usewho presents to the office today for 71-month follow-up.   She was last examined by myself in 01/2020 and reported occasional episodes of chest pain which would last for seconds at a time but no persistent symptoms. She was working with PT with plans to utilize prosthetics in the coming weeks. Given her episodes of chest pain would improve when she would take extra Imdur, her baseline Imdur dosing was increased from 60mg  in AM/30mg  in PM to 60mg  BID.   In talking with the patient today, one of her main concerns at this time is hair loss and more frequent appearing moles. She has been followed by Dr. Tarri Glenn (Dermatology) in [place]. Says that labs were obtained by her PCP within the past month and showed no acute findings to explain her hair loss but she is unsure what was checked at that time.  She does experience occasional twinges of chest pain but denies any persistent symptoms. Breathing has been at baseline. No recent orthopnea or PND. She does have prosthetics and is planning to use these with PT within the next few weeks. She does produce urine on occasion but says she had burning and pain with her most frequent urination episode this  past weekend and was started on an antibiotic by Nephrology.   Past Medical History:  Diagnosis Date  . Coronary artery disease    a. s/p prior PCI in 1998 b. 10/2017: cath showing severe two-vessel CAD with heavy calcification along the LCx and 100% CTO of RCA with left to right collaterals noted. No good options for PCI. Medical management recommended.   . Depression   . Environmental allergies    uses inhalers  . ESRD (end stage renal disease) on dialysis [facility])    "started dialysis on 10/08/13; MWF; [facility]; [place], [place]"  . GERD (gastroesophageal reflux disease)   . XFGHWEXH(371.6)    "weekly" (11/09/2017)  . High cholesterol   . Hypertension   . Hypothyroidism   . Multiple sclerosis ([place])    just diagnosed 2014  . Myocardial infarction ([place]) 1998  . Nonalcoholic steatohepatitis ([facility])   . Peripheral vascular disease ([place])    aortic artery occlusion  . PONV (postoperative nausea and vomiting)   . Thyroid cancer ([place])   . Type 1 diabetes mellitus ([place])   . Umbilical hernia     Past Surgical History:  Procedure Laterality Date  . ABDOMINAL SURGERY  2006   aortic artery occluded, had to "clean it out"  . ABOVE KNEE LEG AMPUTATION Left 05/21/2019  . AMPUTATION Left 09/22/2014   Procedure: AMPUTATION LEFT FOURTH FINGER;  Surgeon: Dayna Barker, MD;  Location: [place];  Service: Clinical cytogeneticist;  Laterality: Left;  . AMPUTATION Left 04/25/2019   Procedure: DEBRIDEMENT BELOW KNEE AMPUTATION;  Surgeon: Waynetta Sandy, MD;  Location: [place];  Service: Vascular;  Laterality: Left;  . AMPUTATION Left 05/21/2019   Procedure: AMPUTATION ABOVE LEFT KNEE;  Surgeon: Waynetta Sandy, MD;  Location: [place];  Service: Vascular;  Laterality: Left;  . AV FISTULA PLACEMENT Left 09/24/2013   Procedure: LEFT UPPER EXTREMITY ARTERIOVENOUS (AV) FISTULA CREATION BRACHIAL/CEPHALIC;  Surgeon: Elam Dutch, MD;  Location: [place];  Service: Vascular;  Laterality: Left;  . BASCILIC VEIN TRANSPOSITION  Left 02/18/2014   Procedure: BASILIC VEIN TRANSPOSITION - 2ND STAGE LEFT ARM;  Surgeon: Elam Dutch, MD;  Location: [place];  Service: Vascular;  Laterality: Left;  . CARDIAC CATHETERIZATION     "numerous"  . CARDIAC CATHETERIZATION  11/09/2017  . CORONARY ANGIOPLASTY    . CORONARY ANGIOPLASTY WITH STENT PLACEMENT     "think I have a total of 3 stents" (11/09/2017)  . INSERTION OF DIALYSIS CATHETER Right 10/07/2013   Procedure: INSERTION OF DIALYSIS CATHETER;  Surgeon: Conrad Northfield, MD;  Location: [place];  Service: Vascular;  Laterality: Right;  . IRRIGATION AND DEBRIDEMENT ABSCESS Right   . LEFT HEART CATH AND CORONARY ANGIOGRAPHY N/A 11/09/2017   Procedure: LEFT HEART CATH AND CORONARY ANGIOGRAPHY;  Surgeon: Burnell Blanks, MD;  Location: [facility] CV [facility];  Service: Cardiovascular;  Laterality: N/A;  . LEG AMPUTATION BELOW KNEE Bilateral 2008-2010   left-right  . PARATHYROIDECTOMY    . SHUNTOGRAM Left 04/17/2014   Procedure: FISTULOGRAM;  Surgeon: Conrad Erie, MD;  Location: [facility] CATH LAB;  Service: Cardiovascular;  Laterality: Left;  . THYROIDECTOMY      Current Medications: Outpatient Medications Prior to Visit  Medication Sig Dispense Refill  . aspirin EC 81 MG tablet Take 1 tablet (81 mg total) by mouth daily. (Patient taking differently: Take 81 mg by mouth 2 (two) times a day. )    . calcium acetate (PHOSLO) 667 MG capsule Take 1,334-2,668 mg by mouth See admin instructions. Take 4 capsules (2668 mg) by mouth with meals and take 2 capsules (1334 mg) by mouth with snacks.    . clopidogrel (PLAVIX) 75 MG tablet Take 1 tablet (75 mg total) by mouth every evening. 90 tablet 3  . famotidine (PEPCID) 40 MG tablet Take 40 mg by mouth every evening.    . folic acid-vitamin b complex-vitamin c-selenium-zinc (DIALYVITE) 3 MG TABS tablet Take 1 tablet by mouth daily.    . furosemide (LASIX) 80 MG tablet Take 80 mg by mouth 2 (two) times a day.    . gabapentin (NEURONTIN) 100 MG  capsule Take 100-200 mg by mouth 3 (three) times daily as needed (pain. (scheduled at night before bedtime)).    Marland Kitchen insulin glargine (LANTUS) 100 UNIT/ML injection Inject 31-62 Units into the skin at bedtime as needed (for blood sugars greater than 150).     . insulin lispro (HUMALOG) 100 UNIT/ML injection Inject 12 Units into the skin 3 (three) times daily as needed for high blood sugar (blood sugars greater than 130).     . isosorbide mononitrate (IMDUR) 60 MG 24 hr tablet Take 1 tablet (60 mg total) by mouth in the morning and at bedtime. 180 tablet 3  . levothyroxine (SYNTHROID) 200 MCG tablet Take 200 mcg by mouth daily before breakfast.    . lidocaine-prilocaine (EMLA) cream Apply 1 application topically as needed (for pain/ apply prior to dialysis).    Marland Kitchen  metoprolol succinate (TOPROL-XL) 100 MG 24 hr tablet Take 100 mg by mouth 2 (two) times a day.     . Omega-3 Fatty Acids (FISH OIL) 1000 MG CAPS Take 1,000 mg by mouth 2 (two) times a day.     . oxyCODONE-acetaminophen (PERCOCET) 10-325 MG tablet Take 1 tablet by mouth 5 (five) times daily.    . promethazine (PHENERGAN) 25 MG tablet Take by mouth.    . ranitidine (ZANTAC) 300 MG tablet Take by mouth.    . rosuvastatin (CRESTOR) 10 MG tablet Take 1 tablet (10 mg total) by mouth daily. 90 tablet 3  . sevelamer carbonate (RENVELA) 800 MG tablet Take by mouth.    . topiramate (TOPAMAX) 50 MG tablet Take 50 mg by mouth daily as needed (headaches.).     No facility-administered medications prior to visit.     Allergies:   Metformin and related and Chantix [varenicline]   Social History   Socioeconomic History  . Marital status: Married    Spouse name: Lenox Ladouceur  . Number of children: Not on file  . Years of education: Not on file  . Highest education level: Not on file  Occupational History  . Occupation: Disabled  Tobacco Use  . Smoking status: Current Every Day Smoker    Packs/day: 0.75    Years: 32.00    Pack years: 24.00     Types: Cigarettes  . Smokeless tobacco: Never Used  . Tobacco comment: 1/2 - 3/4 packs per day  Vaping Use  . Vaping Use: Never used  Substance and Sexual Activity  . Alcohol use: No  . Drug use: No  . Sexual activity: Not Currently  Other Topics Concern  . Not on file  Social History Narrative  . Not on file   Social Determinants of Health   Financial Resource Strain:   . Difficulty of Paying Living Expenses: Not on file  Food Insecurity:   . Worried About Charity fundraiser in the Last Year: Not on file  . Ran Out of Food in the Last Year: Not on file  Transportation Needs:   . Lack of Transportation (Medical): Not on file  . Lack of Transportation (Non-Medical): Not on file  Physical Activity:   . Days of Exercise per Week: Not on file  . Minutes of Exercise per Session: Not on file  Stress:   . Feeling of Stress : Not on file  Social Connections:   . Frequency of Communication with Friends and Family: Not on file  . Frequency of Social Gatherings with Friends and Family: Not on file  . Attends Religious Services: Not on file  . Active Member of Clubs or Organizations: Not on file  . Attends Archivist Meetings: Not on file  . Marital Status: Not on file     Family History:  The patient's family history includes CAD (age of onset: 55) in her father; Diabetes Mellitus II in her father and mother; Ovarian cancer in her mother.   Review of Systems:   Please see the history of present illness.     General:  No chills, fever, night sweats or weight changes. Positive for fatigue and hair loss.  Cardiovascular:  No chest pain, dyspnea on exertion, edema, orthopnea, palpitations, paroxysmal nocturnal dyspnea. Dermatological: No rash. Respiratory: No cough, dyspnea Urologic: No hematuria, dysuria Abdominal:   No nausea, vomiting, diarrhea, bright red blood per rectum, melena, or hematemesis Neurologic:  No visual changes, wkns, changes in mental status.  All  other systems reviewed and are otherwise negative except as noted above.   Physical Exam:    VS:  BP (!) 146/72   Pulse 60   Wt 202 lb (91.6 kg)   LMP 05/24/2013   SpO2 97%   BMI 54.60 kg/m    General: Well developed, well nourished,female appearing in no acute distress. Sitting in wheelchair.  Head: Normocephalic, atraumatic. Wearing a mask.  Neck: No carotid bruits. JVD not elevated.  Lungs: Respirations regular and unlabored, without wheezes or rales.  Heart: Regular rate and rhythm. No S3 or S4.  No murmur, no rubs, or gallops appreciated. Abdomen: Appears non-distended. No obvious abdominal masses. Msk:  Strength and tone appear normal for age. No obvious joint deformities or effusions. Extremities: No clubbing or cyanosis. Right BKA and Left AKA.  Neuro: Alert and oriented X 3. Moves all extremities spontaneously. No focal deficits noted. Psych:  Responds to questions appropriately with a normal affect. Skin: No rashes or lesions noted  Wt Readings from Last 3 Encounters:  08/18/20 202 lb (91.6 kg)  02/11/20 205 lb (93 kg)  06/21/19 202 lb (91.6 kg)     Studies/Labs Reviewed:   EKG:  EKG is ordered today.  The ekg ordered today demonstrates sinus bradycardia, HR 59 with incomplete LBBB. No acute ST changes when compared to prior tracings.   Recent Labs: No results found for requested labs within last 8760 hours.   Lipid Panel    Component Value Date/Time   CHOL 185 11/10/2017 0527   TRIG 295 (H) 11/10/2017 0527   HDL 24 (L) 11/10/2017 0527   CHOLHDL 7.7 11/10/2017 0527   VLDL 59 (H) 11/10/2017 0527   LDLCALC 102 (H) 11/10/2017 0527    Additional studies/ records that were reviewed today include:   Echocardiogram: 10/2017 Study Conclusions   - Left ventricle: The cavity size was normal. Wall thickness was  increased in a pattern of mild LVH. Systolic function was normal.  The estimated ejection fraction was in the range of 50% to 55%.  There is  hypokinesis of the inferolateral myocardium. Features  are consistent with a pseudonormal left ventricular filling  pattern, with concomitant abnormal relaxation and increased  filling pressure (grade 2 diastolic dysfunction). Doppler  parameters are consistent with high ventricular filling pressure.  - Mitral valve: Calcified annulus.  - Left atrium: The atrium was severely dilated.   Impressions:   - Hypokinesis of the inferolateral wall with overall low normal LV  systolic function; mild LVH; moderate diastolic dysfunction;  severe LAE.   Cardiac Catheterization: 10/2017  Mid RCA to Dist RCA lesion is 100% stenosed.   1. Severe double vessel CAD 2. The LAD is a large caliber vessel that courses to the apex. The mid vessel has a long segment of moderate, calcific stenosis. This does not appear to be flow limiting. The first diagonal is a small caliber vessel with moderate ostial stenosis. The second diagonal branch is a small caliber vessel with mild plaque.  3. The Circumflex is a moderate caliber, heavily calcified vessel from the ostium down into the obtuse marginal branches. There is moderately severe, heavily calcified stenosis from the proximal vessel down into the most distal obtuse marginal branch. The obtuse marginal branch is completely occluded. It appears that the distal segment of this branch fills from bridging collaterals. The vessel is small in caliber after it reconstitutes.  4. The RCA is a large dominant vessel with 100% occlusion just beyond the ostium. The  distal vessel and distal branches are seen to fill from left to right collaterals supplied by septal perforators.  5. LV systolic function is overall preserved with segmental wall motion abnormality. LVEF estimated around 45-50%.   Recommendations: She has complex CAD with no good options for revascularization. Her RCA is chronically occluded and fills from collaterals. The entire Circumflex is diffusely  diseased and heavily calcified with a distal chronic total occlusion of the OM branch. I would not approach this vessel with PCI. If PCI of the Circumflex were considered, we would have to perform atherectomy of the entire vessel and likely could not cross the distal CTO. The entire vessel would need to be stented and the risk of restenosis would be very high in this patient with diabetes and ongoing tobacco abuse. She is not a candidate for CABG given her comorbid conditions including ESRD and the lack of graft conduit given bilateral lower extremity amputations. At this time, I would attempt to control her symptoms with medical therapy. She should consider stopping smoking as well.    Assessment:    1. Coronary artery disease of native artery of native heart with stable angina pectoris ([place])   2. Essential hypertension   3. PVD (peripheral vascular disease) ([place])   4. Postoperative hypothyroidism   5. ESRD (end stage renal disease) on dialysis [facility])      Plan:   In order of problems listed above:  1. CAD - She is s/p prior PCI in 1998 with prior catheterization in 10/2017 showing severe two-vessel CAD with heavy calcification along the LCx and 100% CTO of RCA with left to right collaterals noted and medical management recommended as not felt to be a CABG candidate. - She does have occasional episodes of chest discomfort which last for seconds but denies any persistent symptoms. Breathing has been at baseline. - Continue current medical therapy with ASA 81 mg daily, Plavix 75 mg daily, Imdur 60mg  BID, Toprol-XL 100 mg twice daily and Crestor 10 mg daily.  2. HTN - BP is elevated at 146/72 during today's visit but she reports this has overall been well controlled and she actually has episodes of hypotension during HD. No changes were made to her medical therapy during today's visit. Continue current medication regimen with Imdur 60 mg twice daily and Toprol-XL 100 mg twice daily.  3. PVD -  She is s/p bilateral BKA with left AKA in 05/2019. Followed by Vascular Surgery. She has received prosthetics and is planning to work with PT over the next several weeks in trying to increase her activity level. - Remains on ASA, Plavix and statin therapy.   4. Hypothyroidism - She reports more frequent hair loss and I am concerned this could be secondary to her hypothyroidism. She does report recent labs with her PCP and I will request a copy to make sure a TSH was checked at that time. Currently on Synthroid 200 mcg daily.  5. ESRD - On HD - MWF schedule. Per her report, she was recently prescribed an antibiotic for dysuria and concerns for a UTI.  She does remain on Lasix 80 mg twice daily per Nephrology.   Medication Adjustments/Labs and Tests Ordered: Current medicines are reviewed at length with the patient today.  Concerns regarding medicines are outlined above.  Medication changes, Labs and Tests ordered today are listed in the Patient Instructions below. Patient Instructions  Medication Instructions:  Your physician recommends that you continue on your current medications as directed. Please refer to  the Current Medication list given to you today.  *If you need a refill on your cardiac medications before your next appointment, please call your pharmacy*   Lab Work: NONE   If you have labs (blood work) drawn today and your tests are completely normal, you will receive your results only by: Marland Kitchen MyChart Message (if you have MyChart) OR . A paper copy in the mail If you have any lab test that is abnormal or we need to change your treatment, we will call you to review the results.   Testing/Procedures: NONE    Follow-Up: At [facility], you and your health needs are our priority.  As part of our continuing mission to provide you with exceptional heart care, we have created designated Provider Care Teams.  These Care Teams include your primary Cardiologist (physician) and  Advanced Practice Providers (APPs -  Physician Assistants and Nurse Practitioners) who all work together to provide you with the care you need, when you need it.  We recommend signing up for the patient portal called "MyChart".  Sign up information is provided on this After Visit Summary.  MyChart is used to connect with patients for Virtual Visits (Telemedicine).  Patients are able to view lab/test results, encounter notes, upcoming appointments, etc.  Non-urgent messages can be sent to your provider as well.   To learn more about what you can do with MyChart, go to [email or web address].    Your next appointment:   6 month(s)  The format for your next appointment:   In Person  Provider:   To be determinded    Other Instructions Thank you for choosing [facility]!     Signed, Erma Heritage, PA-C  08/18/2020 5:03 PM    [facility] [address] [place], [place] [address] Phone: [phone] Fax: [phone]

## 2020-08-18 ENCOUNTER — Other Ambulatory Visit: Payer: Self-pay

## 2020-08-18 ENCOUNTER — Ambulatory Visit (INDEPENDENT_AMBULATORY_CARE_PROVIDER_SITE_OTHER): Payer: Medicare Other | Admitting: Student

## 2020-08-18 ENCOUNTER — Encounter: Payer: Self-pay | Admitting: *Deleted

## 2020-08-18 ENCOUNTER — Encounter: Payer: Self-pay | Admitting: Student

## 2020-08-18 VITALS — BP 146/72 | HR 60 | Wt 202.0 lb

## 2020-08-18 DIAGNOSIS — E890 Postprocedural hypothyroidism: Secondary | ICD-10-CM | POA: Diagnosis not present

## 2020-08-18 DIAGNOSIS — Z992 Dependence on renal dialysis: Secondary | ICD-10-CM | POA: Diagnosis not present

## 2020-08-18 DIAGNOSIS — I739 Peripheral vascular disease, unspecified: Secondary | ICD-10-CM | POA: Diagnosis not present

## 2020-08-18 DIAGNOSIS — N186 End stage renal disease: Secondary | ICD-10-CM

## 2020-08-18 DIAGNOSIS — I25118 Atherosclerotic heart disease of native coronary artery with other forms of angina pectoris: Secondary | ICD-10-CM

## 2020-08-18 DIAGNOSIS — I10 Essential (primary) hypertension: Secondary | ICD-10-CM | POA: Diagnosis not present

## 2020-08-18 NOTE — Patient Instructions (Signed)
Medication Instructions:  Your physician recommends that you continue on your current medications as directed. Please refer to the Current Medication list given to you today.  *If you need a refill on your cardiac medications before your next appointment, please call your pharmacy*   Lab Work: NONE   If you have labs (blood work) drawn today and your tests are completely normal, you will receive your results only by:  [place] (if you have MyChart) OR  A paper copy in the mail If you have any lab test that is abnormal or we need to change your treatment, we will call you to review the results.   Testing/Procedures: NONE    Follow-Up: At [facility], you and your health needs are our priority.  As part of our continuing mission to provide you with exceptional heart care, we have created designated Provider Care Teams.  These Care Teams include your primary Cardiologist (physician) and Advanced Practice Providers (APPs -  Physician Assistants and Nurse Practitioners) who all work together to provide you with the care you need, when you need it.  We recommend signing up for the patient portal called "MyChart".  Sign up information is provided on this After Visit Summary.  MyChart is used to connect with patients for Virtual Visits (Telemedicine).  Patients are able to view lab/test results, encounter notes, upcoming appointments, etc.  Non-urgent messages can be sent to your provider as well.   To learn more about what you can do with MyChart, go to [email or web address].    Your next appointment:   6 month(s)  The format for your next appointment:   In Person  Provider:   To be determinded    Other Instructions Thank you for choosing [facility]!

## 2020-08-19 ENCOUNTER — Telehealth: Payer: Self-pay | Admitting: *Deleted

## 2020-08-19 DIAGNOSIS — N186 End stage renal disease: Secondary | ICD-10-CM | POA: Diagnosis not present

## 2020-08-19 DIAGNOSIS — Z992 Dependence on renal dialysis: Secondary | ICD-10-CM | POA: Diagnosis not present

## 2020-08-19 DIAGNOSIS — D509 Iron deficiency anemia, unspecified: Secondary | ICD-10-CM | POA: Diagnosis not present

## 2020-08-19 DIAGNOSIS — R531 Weakness: Secondary | ICD-10-CM

## 2020-08-19 DIAGNOSIS — E209 Hypoparathyroidism, unspecified: Secondary | ICD-10-CM | POA: Diagnosis not present

## 2020-08-19 NOTE — Telephone Encounter (Signed)
-----   Message from Erma Heritage, [place] sent at 08/19/2020 11:58 AM EDT ----- Please let the patient know I reviewed recent labs from her PCP and they did not check a TSH. Please see if this can be added on when she has her next lab draw while at hemodialysis. We will likely need to mail the patient a lab slip and she can provide it to them at that time.

## 2020-08-19 NOTE — Telephone Encounter (Signed)
Pt notified and order placed 

## 2020-08-20 DIAGNOSIS — T8789 Other complications of amputation stump: Secondary | ICD-10-CM | POA: Diagnosis not present

## 2020-08-20 DIAGNOSIS — M47816 Spondylosis without myelopathy or radiculopathy, lumbar region: Secondary | ICD-10-CM | POA: Diagnosis not present

## 2020-08-20 DIAGNOSIS — G894 Chronic pain syndrome: Secondary | ICD-10-CM | POA: Diagnosis not present

## 2020-08-20 DIAGNOSIS — M533 Sacrococcygeal disorders, not elsewhere classified: Secondary | ICD-10-CM | POA: Diagnosis not present

## 2020-08-21 DIAGNOSIS — D509 Iron deficiency anemia, unspecified: Secondary | ICD-10-CM | POA: Diagnosis not present

## 2020-08-21 DIAGNOSIS — Z992 Dependence on renal dialysis: Secondary | ICD-10-CM | POA: Diagnosis not present

## 2020-08-21 DIAGNOSIS — N186 End stage renal disease: Secondary | ICD-10-CM | POA: Diagnosis not present

## 2020-08-21 DIAGNOSIS — E209 Hypoparathyroidism, unspecified: Secondary | ICD-10-CM | POA: Diagnosis not present

## 2020-08-24 DIAGNOSIS — E209 Hypoparathyroidism, unspecified: Secondary | ICD-10-CM | POA: Diagnosis not present

## 2020-08-24 DIAGNOSIS — N186 End stage renal disease: Secondary | ICD-10-CM | POA: Diagnosis not present

## 2020-08-24 DIAGNOSIS — Z992 Dependence on renal dialysis: Secondary | ICD-10-CM | POA: Diagnosis not present

## 2020-08-24 DIAGNOSIS — D509 Iron deficiency anemia, unspecified: Secondary | ICD-10-CM | POA: Diagnosis not present

## 2020-08-25 DIAGNOSIS — Z89512 Acquired absence of left leg below knee: Secondary | ICD-10-CM | POA: Diagnosis not present

## 2020-08-25 DIAGNOSIS — Z89511 Acquired absence of right leg below knee: Secondary | ICD-10-CM | POA: Diagnosis not present

## 2020-08-25 DIAGNOSIS — Z7409 Other reduced mobility: Secondary | ICD-10-CM | POA: Diagnosis not present

## 2020-08-25 DIAGNOSIS — M79604 Pain in right leg: Secondary | ICD-10-CM | POA: Diagnosis not present

## 2020-08-26 DIAGNOSIS — D509 Iron deficiency anemia, unspecified: Secondary | ICD-10-CM | POA: Diagnosis not present

## 2020-08-26 DIAGNOSIS — Z992 Dependence on renal dialysis: Secondary | ICD-10-CM | POA: Diagnosis not present

## 2020-08-26 DIAGNOSIS — E209 Hypoparathyroidism, unspecified: Secondary | ICD-10-CM | POA: Diagnosis not present

## 2020-08-26 DIAGNOSIS — N186 End stage renal disease: Secondary | ICD-10-CM | POA: Diagnosis not present

## 2020-08-27 NOTE — Addendum Note (Signed)
Addended by: Levonne Hubert on: 08/27/2020 05:09 PM   Modules accepted: Orders

## 2020-08-28 DIAGNOSIS — E209 Hypoparathyroidism, unspecified: Secondary | ICD-10-CM | POA: Diagnosis not present

## 2020-08-28 DIAGNOSIS — D509 Iron deficiency anemia, unspecified: Secondary | ICD-10-CM | POA: Diagnosis not present

## 2020-08-28 DIAGNOSIS — Z992 Dependence on renal dialysis: Secondary | ICD-10-CM | POA: Diagnosis not present

## 2020-08-28 DIAGNOSIS — N186 End stage renal disease: Secondary | ICD-10-CM | POA: Diagnosis not present

## 2020-08-31 DIAGNOSIS — D509 Iron deficiency anemia, unspecified: Secondary | ICD-10-CM | POA: Diagnosis not present

## 2020-08-31 DIAGNOSIS — E209 Hypoparathyroidism, unspecified: Secondary | ICD-10-CM | POA: Diagnosis not present

## 2020-08-31 DIAGNOSIS — N186 End stage renal disease: Secondary | ICD-10-CM | POA: Diagnosis not present

## 2020-08-31 DIAGNOSIS — Z992 Dependence on renal dialysis: Secondary | ICD-10-CM | POA: Diagnosis not present

## 2020-09-01 DIAGNOSIS — Z7409 Other reduced mobility: Secondary | ICD-10-CM | POA: Diagnosis not present

## 2020-09-01 DIAGNOSIS — M79604 Pain in right leg: Secondary | ICD-10-CM | POA: Diagnosis not present

## 2020-09-01 DIAGNOSIS — Z89512 Acquired absence of left leg below knee: Secondary | ICD-10-CM | POA: Diagnosis not present

## 2020-09-01 DIAGNOSIS — Z89511 Acquired absence of right leg below knee: Secondary | ICD-10-CM | POA: Diagnosis not present

## 2020-09-02 DIAGNOSIS — E209 Hypoparathyroidism, unspecified: Secondary | ICD-10-CM | POA: Diagnosis not present

## 2020-09-02 DIAGNOSIS — D509 Iron deficiency anemia, unspecified: Secondary | ICD-10-CM | POA: Diagnosis not present

## 2020-09-02 DIAGNOSIS — N186 End stage renal disease: Secondary | ICD-10-CM | POA: Diagnosis not present

## 2020-09-02 DIAGNOSIS — E1142 Type 2 diabetes mellitus with diabetic polyneuropathy: Secondary | ICD-10-CM | POA: Diagnosis not present

## 2020-09-02 DIAGNOSIS — I10 Essential (primary) hypertension: Secondary | ICD-10-CM | POA: Diagnosis not present

## 2020-09-02 DIAGNOSIS — Z992 Dependence on renal dialysis: Secondary | ICD-10-CM | POA: Diagnosis not present

## 2020-09-03 DIAGNOSIS — Z89512 Acquired absence of left leg below knee: Secondary | ICD-10-CM | POA: Diagnosis not present

## 2020-09-03 DIAGNOSIS — Z7409 Other reduced mobility: Secondary | ICD-10-CM | POA: Diagnosis not present

## 2020-09-03 DIAGNOSIS — M79604 Pain in right leg: Secondary | ICD-10-CM | POA: Diagnosis not present

## 2020-09-03 DIAGNOSIS — Z89511 Acquired absence of right leg below knee: Secondary | ICD-10-CM | POA: Diagnosis not present

## 2020-09-04 DIAGNOSIS — D509 Iron deficiency anemia, unspecified: Secondary | ICD-10-CM | POA: Diagnosis not present

## 2020-09-04 DIAGNOSIS — Z992 Dependence on renal dialysis: Secondary | ICD-10-CM | POA: Diagnosis not present

## 2020-09-04 DIAGNOSIS — E209 Hypoparathyroidism, unspecified: Secondary | ICD-10-CM | POA: Diagnosis not present

## 2020-09-04 DIAGNOSIS — N186 End stage renal disease: Secondary | ICD-10-CM | POA: Diagnosis not present

## 2020-09-07 DIAGNOSIS — D509 Iron deficiency anemia, unspecified: Secondary | ICD-10-CM | POA: Diagnosis not present

## 2020-09-07 DIAGNOSIS — E209 Hypoparathyroidism, unspecified: Secondary | ICD-10-CM | POA: Diagnosis not present

## 2020-09-07 DIAGNOSIS — N186 End stage renal disease: Secondary | ICD-10-CM | POA: Diagnosis not present

## 2020-09-07 DIAGNOSIS — Z992 Dependence on renal dialysis: Secondary | ICD-10-CM | POA: Diagnosis not present

## 2020-09-09 DIAGNOSIS — N186 End stage renal disease: Secondary | ICD-10-CM | POA: Diagnosis not present

## 2020-09-09 DIAGNOSIS — Z992 Dependence on renal dialysis: Secondary | ICD-10-CM | POA: Diagnosis not present

## 2020-09-09 DIAGNOSIS — E209 Hypoparathyroidism, unspecified: Secondary | ICD-10-CM | POA: Diagnosis not present

## 2020-09-09 DIAGNOSIS — D509 Iron deficiency anemia, unspecified: Secondary | ICD-10-CM | POA: Diagnosis not present

## 2020-09-11 DIAGNOSIS — Z992 Dependence on renal dialysis: Secondary | ICD-10-CM | POA: Diagnosis not present

## 2020-09-11 DIAGNOSIS — D509 Iron deficiency anemia, unspecified: Secondary | ICD-10-CM | POA: Diagnosis not present

## 2020-09-11 DIAGNOSIS — E209 Hypoparathyroidism, unspecified: Secondary | ICD-10-CM | POA: Diagnosis not present

## 2020-09-11 DIAGNOSIS — N186 End stage renal disease: Secondary | ICD-10-CM | POA: Diagnosis not present

## 2020-09-14 DIAGNOSIS — Z992 Dependence on renal dialysis: Secondary | ICD-10-CM | POA: Diagnosis not present

## 2020-09-14 DIAGNOSIS — E209 Hypoparathyroidism, unspecified: Secondary | ICD-10-CM | POA: Diagnosis not present

## 2020-09-14 DIAGNOSIS — D509 Iron deficiency anemia, unspecified: Secondary | ICD-10-CM | POA: Diagnosis not present

## 2020-09-14 DIAGNOSIS — N186 End stage renal disease: Secondary | ICD-10-CM | POA: Diagnosis not present

## 2020-09-15 DIAGNOSIS — Z992 Dependence on renal dialysis: Secondary | ICD-10-CM | POA: Diagnosis not present

## 2020-09-15 DIAGNOSIS — N186 End stage renal disease: Secondary | ICD-10-CM | POA: Diagnosis not present

## 2020-09-16 DIAGNOSIS — D509 Iron deficiency anemia, unspecified: Secondary | ICD-10-CM | POA: Diagnosis not present

## 2020-09-16 DIAGNOSIS — N186 End stage renal disease: Secondary | ICD-10-CM | POA: Diagnosis not present

## 2020-09-16 DIAGNOSIS — N2581 Secondary hyperparathyroidism of renal origin: Secondary | ICD-10-CM | POA: Diagnosis not present

## 2020-09-16 DIAGNOSIS — Z992 Dependence on renal dialysis: Secondary | ICD-10-CM | POA: Diagnosis not present

## 2020-09-18 DIAGNOSIS — N2581 Secondary hyperparathyroidism of renal origin: Secondary | ICD-10-CM | POA: Diagnosis not present

## 2020-09-18 DIAGNOSIS — D509 Iron deficiency anemia, unspecified: Secondary | ICD-10-CM | POA: Diagnosis not present

## 2020-09-18 DIAGNOSIS — Z992 Dependence on renal dialysis: Secondary | ICD-10-CM | POA: Diagnosis not present

## 2020-09-18 DIAGNOSIS — N186 End stage renal disease: Secondary | ICD-10-CM | POA: Diagnosis not present

## 2020-09-21 DIAGNOSIS — Z992 Dependence on renal dialysis: Secondary | ICD-10-CM | POA: Diagnosis not present

## 2020-09-21 DIAGNOSIS — D509 Iron deficiency anemia, unspecified: Secondary | ICD-10-CM | POA: Diagnosis not present

## 2020-09-21 DIAGNOSIS — N2581 Secondary hyperparathyroidism of renal origin: Secondary | ICD-10-CM | POA: Diagnosis not present

## 2020-09-21 DIAGNOSIS — N186 End stage renal disease: Secondary | ICD-10-CM | POA: Diagnosis not present

## 2020-09-22 DIAGNOSIS — T8789 Other complications of amputation stump: Secondary | ICD-10-CM | POA: Diagnosis not present

## 2020-09-22 DIAGNOSIS — M169 Osteoarthritis of hip, unspecified: Secondary | ICD-10-CM | POA: Diagnosis not present

## 2020-09-22 DIAGNOSIS — G894 Chronic pain syndrome: Secondary | ICD-10-CM | POA: Diagnosis not present

## 2020-09-22 DIAGNOSIS — M533 Sacrococcygeal disorders, not elsewhere classified: Secondary | ICD-10-CM | POA: Diagnosis not present

## 2020-09-23 DIAGNOSIS — N2581 Secondary hyperparathyroidism of renal origin: Secondary | ICD-10-CM | POA: Diagnosis not present

## 2020-09-23 DIAGNOSIS — Z992 Dependence on renal dialysis: Secondary | ICD-10-CM | POA: Diagnosis not present

## 2020-09-23 DIAGNOSIS — N186 End stage renal disease: Secondary | ICD-10-CM | POA: Diagnosis not present

## 2020-09-23 DIAGNOSIS — D509 Iron deficiency anemia, unspecified: Secondary | ICD-10-CM | POA: Diagnosis not present

## 2020-09-25 DIAGNOSIS — N2581 Secondary hyperparathyroidism of renal origin: Secondary | ICD-10-CM | POA: Diagnosis not present

## 2020-09-25 DIAGNOSIS — D509 Iron deficiency anemia, unspecified: Secondary | ICD-10-CM | POA: Diagnosis not present

## 2020-09-25 DIAGNOSIS — N186 End stage renal disease: Secondary | ICD-10-CM | POA: Diagnosis not present

## 2020-09-25 DIAGNOSIS — Z992 Dependence on renal dialysis: Secondary | ICD-10-CM | POA: Diagnosis not present

## 2020-09-28 DIAGNOSIS — D509 Iron deficiency anemia, unspecified: Secondary | ICD-10-CM | POA: Diagnosis not present

## 2020-09-28 DIAGNOSIS — Z992 Dependence on renal dialysis: Secondary | ICD-10-CM | POA: Diagnosis not present

## 2020-09-28 DIAGNOSIS — N2581 Secondary hyperparathyroidism of renal origin: Secondary | ICD-10-CM | POA: Diagnosis not present

## 2020-09-28 DIAGNOSIS — N186 End stage renal disease: Secondary | ICD-10-CM | POA: Diagnosis not present

## 2020-09-29 DIAGNOSIS — I10 Essential (primary) hypertension: Secondary | ICD-10-CM | POA: Diagnosis not present

## 2020-09-29 DIAGNOSIS — E1142 Type 2 diabetes mellitus with diabetic polyneuropathy: Secondary | ICD-10-CM | POA: Diagnosis not present

## 2020-09-30 DIAGNOSIS — Z992 Dependence on renal dialysis: Secondary | ICD-10-CM | POA: Diagnosis not present

## 2020-09-30 DIAGNOSIS — D509 Iron deficiency anemia, unspecified: Secondary | ICD-10-CM | POA: Diagnosis not present

## 2020-09-30 DIAGNOSIS — N186 End stage renal disease: Secondary | ICD-10-CM | POA: Diagnosis not present

## 2020-09-30 DIAGNOSIS — N2581 Secondary hyperparathyroidism of renal origin: Secondary | ICD-10-CM | POA: Diagnosis not present

## 2020-10-01 DIAGNOSIS — Z89612 Acquired absence of left leg above knee: Secondary | ICD-10-CM | POA: Diagnosis not present

## 2020-10-01 DIAGNOSIS — E1121 Type 2 diabetes mellitus with diabetic nephropathy: Secondary | ICD-10-CM | POA: Diagnosis not present

## 2020-10-01 DIAGNOSIS — N185 Chronic kidney disease, stage 5: Secondary | ICD-10-CM | POA: Diagnosis not present

## 2020-10-01 DIAGNOSIS — I739 Peripheral vascular disease, unspecified: Secondary | ICD-10-CM | POA: Diagnosis not present

## 2020-10-01 DIAGNOSIS — G35 Multiple sclerosis: Secondary | ICD-10-CM | POA: Diagnosis not present

## 2020-10-01 DIAGNOSIS — N3091 Cystitis, unspecified with hematuria: Secondary | ICD-10-CM | POA: Diagnosis not present

## 2020-10-01 DIAGNOSIS — Z89511 Acquired absence of right leg below knee: Secondary | ICD-10-CM | POA: Diagnosis not present

## 2020-10-02 DIAGNOSIS — N2581 Secondary hyperparathyroidism of renal origin: Secondary | ICD-10-CM | POA: Diagnosis not present

## 2020-10-02 DIAGNOSIS — D509 Iron deficiency anemia, unspecified: Secondary | ICD-10-CM | POA: Diagnosis not present

## 2020-10-02 DIAGNOSIS — Z992 Dependence on renal dialysis: Secondary | ICD-10-CM | POA: Diagnosis not present

## 2020-10-02 DIAGNOSIS — N186 End stage renal disease: Secondary | ICD-10-CM | POA: Diagnosis not present

## 2020-10-05 DIAGNOSIS — N2581 Secondary hyperparathyroidism of renal origin: Secondary | ICD-10-CM | POA: Diagnosis not present

## 2020-10-05 DIAGNOSIS — N186 End stage renal disease: Secondary | ICD-10-CM | POA: Diagnosis not present

## 2020-10-05 DIAGNOSIS — D509 Iron deficiency anemia, unspecified: Secondary | ICD-10-CM | POA: Diagnosis not present

## 2020-10-05 DIAGNOSIS — Z992 Dependence on renal dialysis: Secondary | ICD-10-CM | POA: Diagnosis not present

## 2020-10-07 DIAGNOSIS — Z992 Dependence on renal dialysis: Secondary | ICD-10-CM | POA: Diagnosis not present

## 2020-10-07 DIAGNOSIS — D509 Iron deficiency anemia, unspecified: Secondary | ICD-10-CM | POA: Diagnosis not present

## 2020-10-07 DIAGNOSIS — N2581 Secondary hyperparathyroidism of renal origin: Secondary | ICD-10-CM | POA: Diagnosis not present

## 2020-10-07 DIAGNOSIS — N186 End stage renal disease: Secondary | ICD-10-CM | POA: Diagnosis not present

## 2020-10-09 DIAGNOSIS — N186 End stage renal disease: Secondary | ICD-10-CM | POA: Diagnosis not present

## 2020-10-09 DIAGNOSIS — Z992 Dependence on renal dialysis: Secondary | ICD-10-CM | POA: Diagnosis not present

## 2020-10-09 DIAGNOSIS — D509 Iron deficiency anemia, unspecified: Secondary | ICD-10-CM | POA: Diagnosis not present

## 2020-10-09 DIAGNOSIS — N2581 Secondary hyperparathyroidism of renal origin: Secondary | ICD-10-CM | POA: Diagnosis not present

## 2020-10-12 DIAGNOSIS — Z794 Long term (current) use of insulin: Secondary | ICD-10-CM | POA: Diagnosis not present

## 2020-10-12 DIAGNOSIS — E109 Type 1 diabetes mellitus without complications: Secondary | ICD-10-CM | POA: Diagnosis not present

## 2020-10-12 DIAGNOSIS — N2581 Secondary hyperparathyroidism of renal origin: Secondary | ICD-10-CM | POA: Diagnosis not present

## 2020-10-12 DIAGNOSIS — D509 Iron deficiency anemia, unspecified: Secondary | ICD-10-CM | POA: Diagnosis not present

## 2020-10-12 DIAGNOSIS — N186 End stage renal disease: Secondary | ICD-10-CM | POA: Diagnosis not present

## 2020-10-12 DIAGNOSIS — Z992 Dependence on renal dialysis: Secondary | ICD-10-CM | POA: Diagnosis not present

## 2020-10-14 DIAGNOSIS — N186 End stage renal disease: Secondary | ICD-10-CM | POA: Diagnosis not present

## 2020-10-14 DIAGNOSIS — Z992 Dependence on renal dialysis: Secondary | ICD-10-CM | POA: Diagnosis not present

## 2020-10-14 DIAGNOSIS — D509 Iron deficiency anemia, unspecified: Secondary | ICD-10-CM | POA: Diagnosis not present

## 2020-10-14 DIAGNOSIS — N2581 Secondary hyperparathyroidism of renal origin: Secondary | ICD-10-CM | POA: Diagnosis not present

## 2020-10-16 DIAGNOSIS — D509 Iron deficiency anemia, unspecified: Secondary | ICD-10-CM | POA: Diagnosis not present

## 2020-10-16 DIAGNOSIS — N186 End stage renal disease: Secondary | ICD-10-CM | POA: Diagnosis not present

## 2020-10-16 DIAGNOSIS — Z992 Dependence on renal dialysis: Secondary | ICD-10-CM | POA: Diagnosis not present

## 2020-10-16 DIAGNOSIS — N2581 Secondary hyperparathyroidism of renal origin: Secondary | ICD-10-CM | POA: Diagnosis not present

## 2020-10-17 DIAGNOSIS — Z992 Dependence on renal dialysis: Secondary | ICD-10-CM | POA: Diagnosis not present

## 2020-10-17 DIAGNOSIS — N186 End stage renal disease: Secondary | ICD-10-CM | POA: Diagnosis not present

## 2020-10-19 DIAGNOSIS — N186 End stage renal disease: Secondary | ICD-10-CM | POA: Diagnosis not present

## 2020-10-19 DIAGNOSIS — E559 Vitamin D deficiency, unspecified: Secondary | ICD-10-CM | POA: Diagnosis not present

## 2020-10-19 DIAGNOSIS — Z992 Dependence on renal dialysis: Secondary | ICD-10-CM | POA: Diagnosis not present

## 2020-10-20 DIAGNOSIS — G894 Chronic pain syndrome: Secondary | ICD-10-CM | POA: Diagnosis not present

## 2020-10-20 DIAGNOSIS — M533 Sacrococcygeal disorders, not elsewhere classified: Secondary | ICD-10-CM | POA: Diagnosis not present

## 2020-10-20 DIAGNOSIS — T8789 Other complications of amputation stump: Secondary | ICD-10-CM | POA: Diagnosis not present

## 2020-10-20 DIAGNOSIS — M169 Osteoarthritis of hip, unspecified: Secondary | ICD-10-CM | POA: Diagnosis not present

## 2020-10-21 DIAGNOSIS — E559 Vitamin D deficiency, unspecified: Secondary | ICD-10-CM | POA: Diagnosis not present

## 2020-10-21 DIAGNOSIS — Z992 Dependence on renal dialysis: Secondary | ICD-10-CM | POA: Diagnosis not present

## 2020-10-21 DIAGNOSIS — N186 End stage renal disease: Secondary | ICD-10-CM | POA: Diagnosis not present

## 2020-10-23 DIAGNOSIS — E559 Vitamin D deficiency, unspecified: Secondary | ICD-10-CM | POA: Diagnosis not present

## 2020-10-23 DIAGNOSIS — Z992 Dependence on renal dialysis: Secondary | ICD-10-CM | POA: Diagnosis not present

## 2020-10-23 DIAGNOSIS — N186 End stage renal disease: Secondary | ICD-10-CM | POA: Diagnosis not present

## 2020-10-26 DIAGNOSIS — N186 End stage renal disease: Secondary | ICD-10-CM | POA: Diagnosis not present

## 2020-10-26 DIAGNOSIS — E559 Vitamin D deficiency, unspecified: Secondary | ICD-10-CM | POA: Diagnosis not present

## 2020-10-26 DIAGNOSIS — Z992 Dependence on renal dialysis: Secondary | ICD-10-CM | POA: Diagnosis not present

## 2020-10-28 DIAGNOSIS — Z992 Dependence on renal dialysis: Secondary | ICD-10-CM | POA: Diagnosis not present

## 2020-10-28 DIAGNOSIS — N186 End stage renal disease: Secondary | ICD-10-CM | POA: Diagnosis not present

## 2020-10-28 DIAGNOSIS — E559 Vitamin D deficiency, unspecified: Secondary | ICD-10-CM | POA: Diagnosis not present

## 2020-10-30 DIAGNOSIS — Z992 Dependence on renal dialysis: Secondary | ICD-10-CM | POA: Diagnosis not present

## 2020-10-30 DIAGNOSIS — E559 Vitamin D deficiency, unspecified: Secondary | ICD-10-CM | POA: Diagnosis not present

## 2020-10-30 DIAGNOSIS — N186 End stage renal disease: Secondary | ICD-10-CM | POA: Diagnosis not present

## 2020-11-03 DIAGNOSIS — R0681 Apnea, not elsewhere classified: Secondary | ICD-10-CM | POA: Diagnosis not present

## 2020-11-03 DIAGNOSIS — Z992 Dependence on renal dialysis: Secondary | ICD-10-CM | POA: Diagnosis not present

## 2020-11-03 DIAGNOSIS — R404 Transient alteration of awareness: Secondary | ICD-10-CM | POA: Diagnosis not present

## 2020-11-03 DIAGNOSIS — I469 Cardiac arrest, cause unspecified: Secondary | ICD-10-CM | POA: Diagnosis not present

## 2020-11-03 DIAGNOSIS — R4182 Altered mental status, unspecified: Secondary | ICD-10-CM | POA: Diagnosis not present

## 2020-11-03 DIAGNOSIS — Z89611 Acquired absence of right leg above knee: Secondary | ICD-10-CM | POA: Diagnosis not present

## 2020-11-03 DIAGNOSIS — N179 Acute kidney failure, unspecified: Secondary | ICD-10-CM | POA: Diagnosis not present

## 2020-11-03 DIAGNOSIS — R4020 Unspecified coma: Secondary | ICD-10-CM | POA: Diagnosis not present

## 2020-11-03 DIAGNOSIS — Z89511 Acquired absence of right leg below knee: Secondary | ICD-10-CM | POA: Diagnosis not present

## 2020-11-03 DIAGNOSIS — I499 Cardiac arrhythmia, unspecified: Secondary | ICD-10-CM | POA: Diagnosis not present

## 2020-11-03 DIAGNOSIS — N186 End stage renal disease: Secondary | ICD-10-CM | POA: Diagnosis not present

## 2020-11-16 DIAGNOSIS — Z992 Dependence on renal dialysis: Secondary | ICD-10-CM | POA: Diagnosis not present

## 2020-11-16 DIAGNOSIS — N186 End stage renal disease: Secondary | ICD-10-CM | POA: Diagnosis not present

## 2020-11-17 DEATH — deceased

## 2021-02-18 ENCOUNTER — Ambulatory Visit: Payer: Medicare Other | Admitting: Cardiology

## 2021-03-30 ENCOUNTER — Encounter (HOSPITAL_COMMUNITY): Payer: Self-pay | Admitting: Physical Therapy
